# Patient Record
Sex: Female | Born: 1935 | Race: White | Hispanic: No | Smoking: Never smoker
Health system: Southern US, Community
[De-identification: ages and names within clinical notes are randomized; demographics above are authoritative.]

## PROBLEM LIST (undated history)

## (undated) DIAGNOSIS — K449 Diaphragmatic hernia without obstruction or gangrene: Secondary | ICD-10-CM

## (undated) DIAGNOSIS — I639 Cerebral infarction, unspecified: Secondary | ICD-10-CM

## (undated) DIAGNOSIS — F0280 Dementia in other diseases classified elsewhere without behavioral disturbance: Secondary | ICD-10-CM

## (undated) DIAGNOSIS — K219 Gastro-esophageal reflux disease without esophagitis: Secondary | ICD-10-CM

## (undated) DIAGNOSIS — F250 Schizoaffective disorder, bipolar type: Secondary | ICD-10-CM

## (undated) DIAGNOSIS — R1319 Other dysphagia: Secondary | ICD-10-CM

## (undated) DIAGNOSIS — R7881 Bacteremia: Secondary | ICD-10-CM

## (undated) DIAGNOSIS — E039 Hypothyroidism, unspecified: Secondary | ICD-10-CM

## (undated) DIAGNOSIS — R55 Syncope and collapse: Secondary | ICD-10-CM

## (undated) DIAGNOSIS — J13 Pneumonia due to Streptococcus pneumoniae: Secondary | ICD-10-CM

## (undated) DIAGNOSIS — G309 Alzheimer's disease, unspecified: Secondary | ICD-10-CM

## (undated) DIAGNOSIS — F259 Schizoaffective disorder, unspecified: Secondary | ICD-10-CM

## (undated) DIAGNOSIS — N390 Urinary tract infection, site not specified: Secondary | ICD-10-CM

## (undated) DIAGNOSIS — E119 Type 2 diabetes mellitus without complications: Secondary | ICD-10-CM

## (undated) DIAGNOSIS — J189 Pneumonia, unspecified organism: Secondary | ICD-10-CM

## (undated) DIAGNOSIS — R001 Bradycardia, unspecified: Secondary | ICD-10-CM

## (undated) DIAGNOSIS — K222 Esophageal obstruction: Secondary | ICD-10-CM

## (undated) DIAGNOSIS — M1990 Unspecified osteoarthritis, unspecified site: Secondary | ICD-10-CM

## (undated) HISTORY — PX: HERNIA REPAIR: SHX51

## (undated) HISTORY — PX: JEJUNOSTOMY FEEDING TUBE: SUR737

## (undated) HISTORY — PX: PACEMAKER INSERTION: SHX728

## (undated) HISTORY — DX: Syncope and collapse: R55

---

## 2014-05-18 ENCOUNTER — Emergency Department: Payer: Self-pay | Admitting: Internal Medicine

## 2014-08-07 ENCOUNTER — Emergency Department
Admission: EM | Admit: 2014-08-07 | Discharge: 2014-08-08 | Disposition: A | Discharge status: Home / Self Care | Payer: Medicare Other | Attending: Emergency Medicine | Admitting: Emergency Medicine

## 2014-08-07 ENCOUNTER — Encounter: Payer: Self-pay | Admitting: *Deleted

## 2014-08-07 DIAGNOSIS — E119 Type 2 diabetes mellitus without complications: Secondary | ICD-10-CM | POA: Diagnosis not present

## 2014-08-07 DIAGNOSIS — Z87891 Personal history of nicotine dependence: Secondary | ICD-10-CM | POA: Insufficient documentation

## 2014-08-07 DIAGNOSIS — R451 Restlessness and agitation: Secondary | ICD-10-CM

## 2014-08-07 DIAGNOSIS — L089 Local infection of the skin and subcutaneous tissue, unspecified: Secondary | ICD-10-CM | POA: Diagnosis not present

## 2014-08-07 DIAGNOSIS — Z79899 Other long term (current) drug therapy: Secondary | ICD-10-CM | POA: Diagnosis not present

## 2014-08-07 DIAGNOSIS — F259 Schizoaffective disorder, unspecified: Secondary | ICD-10-CM

## 2014-08-07 HISTORY — DX: Type 2 diabetes mellitus without complications: E11.9

## 2014-08-07 HISTORY — DX: Schizoaffective disorder, unspecified: F25.9

## 2014-08-07 HISTORY — DX: Dementia in other diseases classified elsewhere, unspecified severity, without behavioral disturbance, psychotic disturbance, mood disturbance, and anxiety: F02.80

## 2014-08-07 HISTORY — DX: Schizoaffective disorder, bipolar type: F25.0

## 2014-08-07 HISTORY — DX: Alzheimer's disease, unspecified: G30.9

## 2014-08-07 LAB — COMPREHENSIVE METABOLIC PANEL
ALBUMIN: 4.7 g/dL (ref 3.5–5.0)
ALK PHOS: 118 U/L (ref 38–126)
ALT: 29 U/L (ref 14–54)
AST: 33 U/L (ref 15–41)
Anion gap: 12 (ref 5–15)
BILIRUBIN TOTAL: 0.3 mg/dL (ref 0.3–1.2)
BUN: 16 mg/dL (ref 6–20)
CO2: 25 mmol/L (ref 22–32)
Calcium: 9.7 mg/dL (ref 8.9–10.3)
Chloride: 101 mmol/L (ref 101–111)
Creatinine, Ser: 0.94 mg/dL (ref 0.44–1.00)
GFR calc Af Amer: >60 mL/min (ref >60)
GFR, EST NON AFRICAN AMERICAN: 55 mL/min — AB (ref >60)
Glucose, Bld: 131 mg/dL — ABNORMAL HIGH (ref 65–99)
POTASSIUM: 4 mmol/L (ref 3.5–5.1)
Sodium: 138 mmol/L (ref 135–145)
Total Protein: 7.5 g/dL (ref 6.5–8.1)

## 2014-08-07 LAB — CBC WITH DIFFERENTIAL/PLATELET
Basophils Absolute: 0.1 10*3/uL (ref 0–0.1)
Basophils Relative: 1 %
Eosinophils Absolute: 0.1 10*3/uL (ref 0–0.7)
Eosinophils Relative: 1 %
HCT: 41 % (ref 35.0–47.0)
Hemoglobin: 13.6 g/dL (ref 12.0–16.0)
Lymphocytes Relative: 25 %
Lymphs Abs: 1.8 10*3/uL (ref 1.0–3.6)
MCH: 29 pg (ref 26.0–34.0)
MCHC: 33.2 g/dL (ref 32.0–36.0)
MCV: 87.3 fL (ref 80.0–100.0)
Monocytes Absolute: 0.5 10*3/uL (ref 0.2–0.9)
Monocytes Relative: 7 %
Neutro Abs: 4.9 10*3/uL (ref 1.4–6.5)
Neutrophils Relative %: 66 %
Platelets: 214 10*3/uL (ref 150–440)
RBC: 4.69 MIL/uL (ref 3.80–5.20)
RDW: 15.1 % — ABNORMAL HIGH (ref 11.5–14.5)
WBC: 7.4 10*3/uL (ref 3.6–11.0)

## 2014-08-07 LAB — ETHANOL: Alcohol, Ethyl (B): <5 mg/dL (ref <5)

## 2014-08-07 LAB — VALPROIC ACID LEVEL: Valproic Acid Lvl: <10 ug/mL — ABNORMAL LOW (ref 50.0–100.0)

## 2014-08-07 MED ORDER — QUETIAPINE FUMARATE 25 MG PO TABS
25.0000 mg | ORAL_TABLET | Freq: Every day | ORAL | Status: DC
  Administered 2014-08-07: 25 mg via ORAL

## 2014-08-07 MED ORDER — DIVALPROEX SODIUM 500 MG PO DR TAB
DELAYED_RELEASE_TABLET | ORAL | Status: AC
  Filled 2014-08-07: qty 1

## 2014-08-07 MED ORDER — TRAZODONE HCL 50 MG PO TABS
50.0000 mg | ORAL_TABLET | Freq: Every day | ORAL | Status: DC
  Administered 2014-08-07: 50 mg via ORAL

## 2014-08-07 MED ORDER — TRAZODONE HCL 50 MG PO TABS
ORAL_TABLET | ORAL | Status: AC
  Administered 2014-08-07: 50 mg via ORAL
  Filled 2014-08-07: qty 1

## 2014-08-07 MED ORDER — DIVALPROEX SODIUM 250 MG PO DR TAB
250.0000 mg | DELAYED_RELEASE_TABLET | Freq: Two times a day (BID) | ORAL | Status: DC
  Administered 2014-08-08: 250 mg via ORAL

## 2014-08-07 MED ORDER — DIVALPROEX SODIUM 500 MG PO DR TAB
500.0000 mg | DELAYED_RELEASE_TABLET | Freq: Once | ORAL | Status: AC
  Administered 2014-08-07: 500 mg via ORAL

## 2014-08-07 MED ORDER — QUETIAPINE FUMARATE 25 MG PO TABS
ORAL_TABLET | ORAL | Status: AC
  Administered 2014-08-07: 25 mg via ORAL
  Filled 2014-08-07: qty 1
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
## 2014-08-07 NOTE — ED Notes (Signed)
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
## 2014-08-07 NOTE — ED Notes (Signed)

## 2014-08-07 NOTE — ED Notes (Signed)
BEHAVIORAL HEALTH ROUNDING Patient sleeping: No. Patient alert and oriented: yes Behavior appropriate: Yes.  ;  Nutrition and fluids offered: Yes  Toileting and hygiene offered: Yes  Sitter present: yes Law enforcement present: Yes  

## 2014-08-07 NOTE — ED Notes (Signed)
BEHAVIORAL HEALTH ROUNDING Patient sleeping: Yes.   Patient alert and oriented: not applicable Behavior appropriate: Yes.   Nutrition and fluids offered: Yes  Toileting and hygiene offered: Yes  Sitter present: yes Law enforcement present: Yes   Pt given applejuice

## 2014-08-07 NOTE — ED Notes (Signed)
MD at bedside. 

## 2014-08-08 DIAGNOSIS — F259 Schizoaffective disorder, unspecified: Secondary | ICD-10-CM | POA: Diagnosis not present

## 2014-08-08 LAB — URINALYSIS COMPLETE WITH MICROSCOPIC (ARMC ONLY)
Bilirubin Urine: NEGATIVE
GLUCOSE, UA: NEGATIVE mg/dL
HGB URINE DIPSTICK: NEGATIVE
Ketones, ur: NEGATIVE mg/dL
Leukocytes, UA: NEGATIVE
NITRITE: NEGATIVE
PROTEIN: NEGATIVE mg/dL
RBC / HPF: NONE SEEN RBC/hpf (ref 0–5)
Specific Gravity, Urine: 1.006 (ref 1.005–1.030)
pH: 7 (ref 5.0–8.0)

## 2014-08-08 MED ORDER — ACETAMINOPHEN 325 MG PO TABS
650.0000 mg | ORAL_TABLET | Freq: Once | ORAL | Status: AC
  Administered 2014-08-08: 650 mg via ORAL

## 2014-08-08 MED ORDER — ACETAMINOPHEN 325 MG PO TABS
ORAL_TABLET | ORAL | Status: AC
  Administered 2014-08-08: 650 mg via ORAL
  Filled 2014-08-08: qty 2

## 2014-08-08 MED ORDER — DIVALPROEX SODIUM 250 MG PO DR TAB
DELAYED_RELEASE_TABLET | ORAL | Status: AC
  Administered 2014-08-08: 250 mg via ORAL
  Filled 2014-08-08: qty 1
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
## 2014-08-08 NOTE — ED Notes (Signed)
Pt given breakfast tray

## 2014-08-08 NOTE — ED Notes (Signed)
Dr. Weber Cooks at bedside

## 2014-08-08 NOTE — ED Notes (Signed)
BEHAVIORAL HEALTH ROUNDING Patient sleeping: Yes.   Patient alert and oriented: not applicable, pt asleep Behavior appropriate: Yes.  } Nutrition and fluids offered: No, pt sleeping Toileting and hygiene offered: No, pt sleeping Sitter present: yes Law enforcement present: Yes

## 2014-08-08 NOTE — Discharge Instructions (Signed)
Please follow-up with hospice as planned please take her medicines as directed and return for any further problems thank you

## 2014-08-08 NOTE — ED Notes (Signed)

## 2014-08-08 NOTE — ED Notes (Signed)
Pt given her dentures, pt states "I want a lawyer to take away POA from my daughter"
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
## 2014-08-08 NOTE — ED Notes (Signed)
Patient continues to rest in room with no behaviors noted. Respirations even and non-labored. No anticipated needs identified. Will continue to monitor.

## 2014-08-08 NOTE — ED Notes (Signed)

## 2014-08-08 NOTE — ED Notes (Signed)
BEHAVIORAL HEALTH ROUNDING Patient sleeping: No. Patient alert and oriented: yes Behavior appropriate: Yes.  ;  Nutrition and fluids offered: Yes  Toileting and hygiene offered: Yes  Sitter present: yes Law enforcement present: Yes  

## 2014-08-08 NOTE — ED Notes (Signed)
Pt given food tray.

## 2014-08-08 NOTE — ED Notes (Signed)
Patient sleeping. NAD noted. Will continue to monitor

## 2014-08-08 NOTE — ED Notes (Signed)
pts medication counted by pharmacy and sent to pharmacy

## 2014-08-08 NOTE — ED Notes (Addendum)

## 2014-08-08 NOTE — ED Provider Notes (Signed)
-----------------------------------------   6:05 AM on 08/08/2014 -----------------------------------------   BP 166/97 mmHg  Pulse 86  Temp(Src) 99.3 F (37.4 C) (Oral)  Resp 20  Ht '5\' 4"'$  (1.626 m)  Wt 130 lb (58.968 kg)  BMI 22.30 kg/m2  SpO2 94%  The patient had no acute events since last update.  Calm and cooperative at this time.  Disposition is pending per Psychiatry/Behavioral Medicine team recommendations.     Paulette Blanch, MD 08/08/14 [phone]

## 2014-08-08 NOTE — ED Notes (Signed)
BEHAVIORAL HEALTH ROUNDING Patient sleeping: No. Patient alert and oriented: yes Behavior appropriate: Yes.  ;} Nutrition and fluids offered: Yes  Toileting and hygiene offered: Yes  Sitter present: yes Law enforcement present: Yes    Pt resting in bed in no distress, calm  ENVIRONMENTAL ASSESSMENT Potentially harmful objects out of patient reach: Yes.   Personal belongings secured: Yes.   Patient dressed in hospital provided attire only: Yes.   Plastic bags out of patient reach: Yes.   Patient care equipment (cords, cables, call bells, lines, and drains) shortened, removed, or accounted for: Yes.   Equipment and supplies removed from bottom of stretcher: Yes.   Potentially toxic materials out of patient reach: Yes.   Sharps container removed or out of patient reach: Yes.

## 2014-08-08 NOTE — ED Notes (Signed)
Patient observed sleeping at this time. No inappropriate behaviors exhibited since this RN took over care of patient in the 2300 hour. Patient sleeping with even and non-labored respirations. There are no anticipated needs at this time. Will continue to monitor.

## 2014-08-11 ENCOUNTER — Emergency Department
Admission: EM | Admit: 2014-08-11 | Discharge: 2014-08-16 | Disposition: A | Discharge status: Other Acute Inpatient Hospital | Payer: Medicare Other | Attending: Emergency Medicine | Admitting: Emergency Medicine

## 2014-08-11 DIAGNOSIS — Z87891 Personal history of nicotine dependence: Secondary | ICD-10-CM | POA: Diagnosis not present

## 2014-08-11 DIAGNOSIS — F911 Conduct disorder, childhood-onset type: Secondary | ICD-10-CM | POA: Diagnosis not present

## 2014-08-11 DIAGNOSIS — Z658 Other specified problems related to psychosocial circumstances: Secondary | ICD-10-CM

## 2014-08-11 DIAGNOSIS — E119 Type 2 diabetes mellitus without complications: Secondary | ICD-10-CM | POA: Diagnosis not present

## 2014-08-11 DIAGNOSIS — R4689 Other symptoms and signs involving appearance and behavior: Secondary | ICD-10-CM

## 2014-08-11 DIAGNOSIS — Z79899 Other long term (current) drug therapy: Secondary | ICD-10-CM | POA: Insufficient documentation

## 2014-08-11 DIAGNOSIS — Z046 Encounter for general psychiatric examination, requested by authority: Secondary | ICD-10-CM | POA: Diagnosis present

## 2014-08-11 DIAGNOSIS — R451 Restlessness and agitation: Secondary | ICD-10-CM

## 2014-08-11 DIAGNOSIS — Z638 Other specified problems related to primary support group: Secondary | ICD-10-CM

## 2014-08-11 DIAGNOSIS — F205 Residual schizophrenia: Secondary | ICD-10-CM

## 2014-08-11 LAB — URINALYSIS COMPLETE WITH MICROSCOPIC (ARMC ONLY)
Bilirubin Urine: NEGATIVE
Glucose, UA: NEGATIVE mg/dL
Hgb urine dipstick: NEGATIVE
Ketones, ur: NEGATIVE mg/dL
Nitrite: NEGATIVE
PH: 6 (ref 5.0–8.0)
PROTEIN: NEGATIVE mg/dL
SPECIFIC GRAVITY, URINE: 1.01 (ref 1.005–1.030)

## 2014-08-11 LAB — COMPREHENSIVE METABOLIC PANEL
ALT: 23 U/L (ref 14–54)
AST: 33 U/L (ref 15–41)
Albumin: 4.1 g/dL (ref 3.5–5.0)
Alkaline Phosphatase: 93 U/L (ref 38–126)
Anion gap: 10 (ref 5–15)
BUN: 21 mg/dL — ABNORMAL HIGH (ref 6–20)
CHLORIDE: 100 mmol/L — AB (ref 101–111)
CO2: 25 mmol/L (ref 22–32)
Calcium: 9.5 mg/dL (ref 8.9–10.3)
Creatinine, Ser: 0.98 mg/dL (ref 0.44–1.00)
GFR calc Af Amer: >60 mL/min (ref >60)
GFR calc non Af Amer: 53 mL/min — ABNORMAL LOW (ref >60)
Glucose, Bld: 132 mg/dL — ABNORMAL HIGH (ref 65–99)
POTASSIUM: 4.4 mmol/L (ref 3.5–5.1)
Sodium: 135 mmol/L (ref 135–145)
TOTAL PROTEIN: 8.1 g/dL (ref 6.5–8.1)
Total Bilirubin: 0.3 mg/dL (ref 0.3–1.2)

## 2014-08-11 LAB — CBC
HCT: 42.9 % (ref 35.0–47.0)
Hemoglobin: 14.2 g/dL (ref 12.0–16.0)
MCH: 28.8 pg (ref 26.0–34.0)
MCHC: 33.1 g/dL (ref 32.0–36.0)
MCV: 87.1 fL (ref 80.0–100.0)
Platelets: 208 10*3/uL (ref 150–440)
RBC: 4.92 MIL/uL (ref 3.80–5.20)
RDW: 15.5 % — AB (ref 11.5–14.5)
WBC: 7.1 10*3/uL (ref 3.6–11.0)

## 2014-08-11 LAB — URINE DRUG SCREEN, QUALITATIVE (ARMC ONLY)
Amphetamines, Ur Screen: NOT DETECTED
BARBITURATES, UR SCREEN: NOT DETECTED
Benzodiazepine, Ur Scrn: NOT DETECTED
COCAINE METABOLITE, UR ~~LOC~~: NOT DETECTED
Cannabinoid 50 Ng, Ur ~~LOC~~: NOT DETECTED
MDMA (Ecstasy)Ur Screen: NOT DETECTED
METHADONE SCREEN, URINE: NOT DETECTED
OPIATE, UR SCREEN: NOT DETECTED
PHENCYCLIDINE (PCP) UR S: NOT DETECTED
TRICYCLIC, UR SCREEN: POSITIVE — AB

## 2014-08-11 LAB — ACETAMINOPHEN LEVEL: Acetaminophen (Tylenol), Serum: <10 ug/mL — ABNORMAL LOW (ref 10–30)

## 2014-08-11 LAB — SALICYLATE LEVEL: Salicylate Lvl: <4 mg/dL (ref 2.8–30.0)

## 2014-08-11 LAB — ETHANOL: Alcohol, Ethyl (B): <5 mg/dL (ref <5)

## 2014-08-11 NOTE — ED Notes (Signed)
BEHAVIORAL HEALTH ROUNDING Patient sleeping: No. Patient alert and oriented: no Behavior appropriate: Yes.  ; If no, describe:  Nutrition and fluids offered: Yes  Toileting and hygiene offered: Yes  Sitter present: no Law enforcement present: Yes
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
## 2014-08-11 NOTE — ED Notes (Signed)
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
## 2014-08-11 NOTE — ED Notes (Signed)
BEHAVIORAL HEALTH ROUNDING Patient sleeping: No. Patient alert and oriented: no Behavior appropriate: Yes.  ; If no, describe:  Nutrition and fluids offered: Yes  Toileting and hygiene offered: Yes  Sitter present: yes Law enforcement present: Yes

## 2014-08-11 NOTE — ED Notes (Signed)
BEHAVIORAL HEALTH ROUNDING Patient sleeping: Yes.   Patient alert and oriented: no Behavior appropriate: Yes.  ; If no, describe:  Nutrition and fluids offered: No Toileting and hygiene offered: No Sitter present: yes Law enforcement present: Yes
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
## 2014-08-11 NOTE — ED Notes (Signed)
Report from shannin, rn.

## 2014-08-12 DIAGNOSIS — F911 Conduct disorder, childhood-onset type: Secondary | ICD-10-CM | POA: Diagnosis not present

## 2014-08-12 DIAGNOSIS — Z658 Other specified problems related to psychosocial circumstances: Secondary | ICD-10-CM

## 2014-08-12 DIAGNOSIS — F205 Residual schizophrenia: Secondary | ICD-10-CM

## 2014-08-12 DIAGNOSIS — Z638 Other specified problems related to primary support group: Secondary | ICD-10-CM

## 2014-08-12 MED ORDER — CLOMIPRAMINE HCL 25 MG PO CAPS
25.0000 mg | ORAL_CAPSULE | Freq: Every day | ORAL | Status: DC
  Administered 2014-08-12 – 2014-08-15 (×4): 25 mg via ORAL
  Filled 2014-08-12 (×8): qty 1

## 2014-08-12 MED ORDER — TRAZODONE HCL 50 MG PO TABS
ORAL_TABLET | ORAL | Status: AC
  Filled 2014-08-12: qty 1

## 2014-08-12 MED ORDER — BACITRACIN ZINC 500 UNIT/GM EX OINT
TOPICAL_OINTMENT | CUTANEOUS | Status: AC
  Administered 2014-08-12: 14:00:00 via CUTANEOUS
  Filled 2014-08-12: qty 0.9

## 2014-08-12 MED ORDER — DIVALPROEX SODIUM ER 500 MG PO TB24
500.0000 mg | ORAL_TABLET | Freq: Two times a day (BID) | ORAL | Status: DC
  Administered 2014-08-12 – 2014-08-15 (×5): 500 mg via ORAL
  Administered 2014-08-16: 08:00:00 via ORAL

## 2014-08-12 MED ORDER — QUETIAPINE FUMARATE 25 MG PO TABS
ORAL_TABLET | ORAL | Status: AC
  Administered 2014-08-12: 100 mg via ORAL
  Filled 2014-08-12: qty 4

## 2014-08-12 MED ORDER — DIVALPROEX SODIUM ER 500 MG PO TB24: 500.0000 mg | ORAL_TABLET | Freq: Every day | ORAL | Status: DC

## 2014-08-12 MED ORDER — TRAZODONE HCL 100 MG PO TABS
100.0000 mg | ORAL_TABLET | Freq: Every day | ORAL | Status: DC
  Administered 2014-08-12 – 2014-08-15 (×4): 100 mg via ORAL

## 2014-08-12 MED ORDER — TRAZODONE HCL 100 MG PO TABS
ORAL_TABLET | ORAL | Status: AC
  Administered 2014-08-12: 100 mg via ORAL
  Filled 2014-08-12: qty 1

## 2014-08-12 MED ORDER — QUETIAPINE FUMARATE 25 MG PO TABS
50.0000 mg | ORAL_TABLET | Freq: Every day | ORAL | Status: DC
Start: 2014-08-12 — End: 2014-08-12

## 2014-08-12 MED ORDER — QUETIAPINE FUMARATE 25 MG PO TABS
100.0000 mg | ORAL_TABLET | Freq: Every day | ORAL | Status: DC
  Administered 2014-08-12: 100 mg via ORAL

## 2014-08-12 MED ORDER — DIVALPROEX SODIUM ER 500 MG PO TB24
ORAL_TABLET | ORAL | Status: AC
  Filled 2014-08-12: qty 1

## 2014-08-12 MED ORDER — DIVALPROEX SODIUM ER 500 MG PO TB24
ORAL_TABLET | ORAL | Status: AC
  Administered 2014-08-12: 500 mg via ORAL
  Filled 2014-08-12: qty 1

## 2014-08-12 MED ORDER — TRAZODONE HCL 50 MG PO TABS
50.0000 mg | ORAL_TABLET | Freq: Every day | ORAL | Status: DC
  Administered 2014-08-12: 50 mg via ORAL

## 2014-08-12 NOTE — ED Notes (Signed)

## 2014-08-12 NOTE — ED Notes (Signed)
Pt continues to sleep.

## 2014-08-12 NOTE — ED Notes (Signed)
BEHAVIORAL HEALTH ROUNDING Patient sleeping: Yes.   Patient alert and oriented: not applicable Behavior appropriate: Yes.  ; If no, describe: sleeping Nutrition and fluids offered: Yes  Toileting and hygiene offered: Yes  Sitter present: no Law enforcement present: Yes

## 2014-08-12 NOTE — ED Notes (Signed)
BEHAVIORAL HEALTH ROUNDING Patient sleeping: No. Patient alert and oriented: yes Behavior appropriate: Yes.  ; If no, describe:  Nutrition and fluids offered: Yes  Toileting and hygiene offered: Yes  Sitter present: yes Law enforcement present: Yes ODS

## 2014-08-12 NOTE — ED Notes (Signed)
Pt continues to be up, ambulatory, states 'how am i going to get out of here?" pt redirected back to bed multiple times.

## 2014-08-12 NOTE — ED Notes (Signed)
BEHAVIORAL HEALTH ROUNDING Patient sleeping: No. Patient alert and oriented: yes Behavior appropriate: No.; If no, describe: Patient coming into the hall often and repeating herself, patient asking about her daughter.  Patient is not violent or aggressive and is able to be re-directed.   Nutrition and fluids offered: Yes  Toileting and hygiene offered: Yes  Sitter present: q 15 min. checks Law enforcement present: Yes

## 2014-08-12 NOTE — ED Notes (Signed)
Pt ambulatory around department. Pt states 'i want to move to a room and i want two sleeping pills." will notify md. Pt declines offer for fluids or snack. Pt alert to self, situation and date.

## 2014-08-12 NOTE — ED Notes (Signed)
BEHAVIORAL HEALTH ROUNDING Patient sleeping: Yes.   Patient alert and oriented: not applicable Behavior appropriate: Yes.  ; If no, describe: sleeping Nutrition and fluids offered: Yes  Toileting and hygiene offered: Yes  Sitter present: not applicable Law enforcement present: Yes

## 2014-08-12 NOTE — ED Notes (Signed)
BEHAVIORAL HEALTH ROUNDING Patient sleeping: Yes.   Patient alert and oriented: yes Behavior appropriate: Yes.  ; If no, describe: sleeping Nutrition and fluids offered: Yes  Toileting and hygiene offered: Yes  Sitter present: no Law enforcement present: Yes

## 2014-08-12 NOTE — ED Notes (Signed)
Pt sleeping. 

## 2014-08-12 NOTE — ED Notes (Signed)
BEHAVIORAL HEALTH ROUNDING Patient sleeping: Yes.   Patient alert and oriented: yes Behavior appropriate: Yes.  ; If no, describe:  Nutrition and fluids offered: No Toileting and hygiene offered: No Sitter present: no Law enforcement present: Yes  

## 2014-08-12 NOTE — ED Notes (Signed)
BEHAVIORAL HEALTH ROUNDING Patient sleeping: No. Patient alert and oriented: yes Behavior appropriate: Yes.  ; If no, describe:  Nutrition and fluids offered: Yes  Toileting and hygiene offered: Yes  Sitter present: no Law enforcement present: Yes  

## 2014-08-12 NOTE — ED Notes (Signed)
Patient's Daughter called and wanted to ensure we had her contact info:  Lisa: [phone]

## 2014-08-12 NOTE — ED Notes (Signed)
IVC/Consult complete/pending placement/All paperwork on chart
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
## 2014-08-13 DIAGNOSIS — F911 Conduct disorder, childhood-onset type: Secondary | ICD-10-CM | POA: Diagnosis not present

## 2014-08-13 MED ORDER — DIVALPROEX SODIUM 500 MG PO DR TAB
DELAYED_RELEASE_TABLET | ORAL | Status: AC
  Administered 2014-08-13: 500 mg
  Filled 2014-08-13: qty 1

## 2014-08-13 MED ORDER — QUETIAPINE FUMARATE 200 MG PO TABS
ORAL_TABLET | ORAL | Status: AC
  Administered 2014-08-13: 200 mg via ORAL
  Filled 2014-08-13: qty 1

## 2014-08-13 MED ORDER — DIVALPROEX SODIUM 500 MG PO DR TAB
DELAYED_RELEASE_TABLET | ORAL | Status: AC
  Administered 2014-08-13: 500 mg via ORAL
  Filled 2014-08-13: qty 1

## 2014-08-13 MED ORDER — QUETIAPINE FUMARATE 200 MG PO TABS
200.0000 mg | ORAL_TABLET | Freq: Every day | ORAL | Status: DC
  Administered 2014-08-13 – 2014-08-15 (×3): 200 mg via ORAL

## 2014-08-13 MED ORDER — TRAZODONE HCL 100 MG PO TABS
ORAL_TABLET | ORAL | Status: AC
  Administered 2014-08-13: 100 mg via ORAL
  Filled 2014-08-13: qty 1

## 2014-08-13 NOTE — ED Notes (Signed)
BEHAVIORAL HEALTH ROUNDING Patient sleeping: No. Patient alert and oriented: yes Behavior appropriate: Yes.  ;  Nutrition and fluids offered: Yes  Toileting and hygiene offered: Yes  Sitter present: yes Law enforcement present: Yes  

## 2014-08-13 NOTE — ED Notes (Signed)
Pt given food tray.

## 2014-08-13 NOTE — ED Notes (Signed)
BEHAVIORAL HEALTH ROUNDING Patient sleeping: No. Patient alert and oriented: yes Behavior appropriate: Yes.  ; If no, describe:  Nutrition and fluids offered: Yes  Toileting and hygiene offered: Yes  Sitter present: no Law enforcement present: Yes  

## 2014-08-13 NOTE — ED Notes (Signed)
BEHAVIORAL HEALTH ROUNDING Patient sleeping: Yes.   Patient alert and oriented: yes Behavior appropriate: Yes.  ; If no, describe:  Nutrition and fluids offered: Yes  Toileting and hygiene offered: Yes  Sitter present: yes Law enforcement present: Yes ODS

## 2014-08-13 NOTE — ED Provider Notes (Signed)
-----------------------------------------   6:02 AM on 08/13/2014 -----------------------------------------   BP 96/56 mmHg  Pulse 81  Temp(Src) 98.4 F (36.9 C) (Oral)  Resp 20  Ht '5\' 4"'$  (1.626 m)  Wt 125 lb 4.8 oz (56.836 kg)  BMI 21.50 kg/m2  SpO2 93%  The patient had no acute events since last update.  Calm and cooperative at this time.  Disposition is pending per Psychiatry/Behavioral Medicine team recommendations.     Loney Hering, MD 08/13/14 [phone]

## 2014-08-13 NOTE — ED Notes (Signed)
BEHAVIORAL HEALTH ROUNDING Patient sleeping: No. Patient alert and oriented: yes Behavior appropriate: No.; Nutrition and fluids offered: Yes  Toileting and hygiene offered: Yes  Sitter present: yes Law enforcement present: Yes

## 2014-08-13 NOTE — ED Notes (Signed)
BEHAVIORAL HEALTH ROUNDING Patient sleeping: Yes.   Patient alert and oriented: yes Behavior appropriate: Yes.  ;  Nutrition and fluids offered: Yes  Toileting and hygiene offered: Yes  Sitter present: yes Law enforcement present: Yes   ENVIRONMENTAL ASSESSMENT Potentially harmful objects out of patient reach: Yes.   Personal belongings secured: Yes.   Patient dressed in hospital provided attire only: Yes.   Plastic bags out of patient reach: Yes.   Patient care equipment (cords, cables, call bells, lines, and drains) shortened, removed, or accounted for: Yes.   Equipment and supplies removed from bottom of stretcher: Yes.   Potentially toxic materials out of patient reach: Yes.   Sharps container removed or out of patient reach: Yes.

## 2014-08-13 NOTE — Progress Notes (Signed)
PT Cancellation Note  Patient Details Name: Jill Copeland MRN: [id number] DOB: Aug 19, 1931   Cancelled Treatment:    Reason Eval/Treat Not Completed: Patient declined, no reason specified (Pt refused to participate in therapy (pt reporting she did not need to get up and did not want to get up or participate in PT))   Leitha Bleak 08/13/2014, 1:18 PM Leitha Bleak, [place]

## 2014-08-13 NOTE — ED Notes (Signed)
BEHAVIORAL HEALTH ROUNDING Patient sleeping: Yes.   Patient alert and oriented: yes Behavior appropriate: Yes.  ;  Nutrition and fluids offered: Yes  Toileting and hygiene offered: Yes  Sitter present: yes Law enforcement present: Yes  

## 2014-08-13 NOTE — ED Notes (Signed)
Patient assigned to appropriate care area. Patient oriented to unit/care area: Informed that, for their safety, care areas are designed for safety and monitored by security cameras at all times; and visiting hours explained to patient. Patient verbalizes understanding, and verbal contract for safety obtained. 

## 2014-08-13 NOTE — Consult Note (Signed)
  Psychiatry: Follow-up for this 81 year old woman with schizophrenia and dementia. Patient interviewed and chart reviewed. Patient states that she is very angry and does not like being here in the hospital. She demands to be transferred to [facility]. She tells me that she is not eating anything because she sick to her stomach and thinks that she will need to receive intravenous feeding. On review of systems patient complains of feeling sick to her stomach and claims she has thrown up although I don't see any evidence of that. Denies having hallucinations. Denies suicidal or homicidal ideation. Describes paranoia. Rest of the physical review of systems unremarkable  Mental status: Reasonably well-groomed under the circumstances woman looks her stated age. Good eye contact. Psychomotor activity limited. Speech is a little bit pressured and loud. Affect is irritated. Mood is stated as being angry. Denies suicidal or homicidal ideation. She denies hallucinations. She is oriented to the basic situation but not to the details. Memory is poor. Current vital signs are stable.  Patient with dementia and schizophrenia needs referral to geriatric psychiatry facility. Continue medications and I have increased her Seroquel dose to 200 mg at night for psychosis and agitation and mood instability. No other change to treatment plan today. Case discussed with emergency room staff.

## 2014-08-14 DIAGNOSIS — F911 Conduct disorder, childhood-onset type: Secondary | ICD-10-CM | POA: Diagnosis not present

## 2014-08-14 LAB — GLUCOSE, CAPILLARY: GLUCOSE-CAPILLARY: 108 mg/dL — AB (ref 65–99)

## 2014-08-14 MED ORDER — DIVALPROEX SODIUM ER 500 MG PO TB24
ORAL_TABLET | ORAL | Status: AC
  Administered 2014-08-14: 500 mg via ORAL
  Filled 2014-08-14: qty 1

## 2014-08-14 MED ORDER — DIVALPROEX SODIUM 500 MG PO DR TAB
DELAYED_RELEASE_TABLET | ORAL | Status: AC
  Administered 2014-08-14: 500 mg
  Filled 2014-08-14: qty 1

## 2014-08-14 MED ORDER — QUETIAPINE FUMARATE 200 MG PO TABS
ORAL_TABLET | ORAL | Status: AC
  Administered 2014-08-14: 200 mg via ORAL
  Filled 2014-08-14: qty 1

## 2014-08-14 MED ORDER — TRAZODONE HCL 50 MG PO TABS
ORAL_TABLET | ORAL | Status: AC
  Administered 2014-08-14: 100 mg via ORAL
  Filled 2014-08-14: qty 2

## 2014-08-14 NOTE — ED Notes (Signed)
Pt showered

## 2014-08-14 NOTE — ED Notes (Signed)
BEHAVIORAL HEALTH ROUNDING Patient sleeping: No. Patient alert and oriented: yes Behavior appropriate: Yes.  ;  Nutrition and fluids offered: Yes  Toileting and hygiene offered: Yes  Sitter present:Q51mns checks  Law enforcement present: Yes

## 2014-08-14 NOTE — ED Notes (Signed)
Pt laying on stretcher, no change in condition.

## 2014-08-14 NOTE — ED Notes (Signed)
BEHAVIORAL HEALTH ROUNDING Patient sleeping: YES Patient alert and oriented: YES Behavior appropriate: YES Describe behavior: No inappropriate or unacceptable behaviors noted at this time.  Nutrition and fluids offered: YES Toileting and hygiene offered: YES Sitter present: Industrial/product designer rounding every 15 minutes on patient to ensure safety.  Law enforcement present: Programmer, applications agency: [place] (ODS)

## 2014-08-14 NOTE — ED Notes (Signed)
BEHAVIORAL HEALTH ROUNDING Patient sleeping: YES Patient alert and oriented: YES Behavior appropriate: YES Describe behavior: No inappropriate or unacceptable behaviors noted at this time.  Nutrition and fluids offered: YES Toileting and hygiene offered: YES Sitter present: Industrial/product designer rounding every 15 minutes on patient to ensure safety.  Law enforcement present: Programmer, applications agency: [place] (ODS)

## 2014-08-14 NOTE — ED Notes (Signed)

## 2014-08-14 NOTE — ED Notes (Signed)
BEHAVIORAL HEALTH ROUNDING Patient sleeping: No. Patient alert and oriented: yes Behavior appropriate: Yes.  ;  Nutrition and fluids offered: Yes  Toileting and hygiene offered: Yes  Sitter present:Q14mns checks  Law enforcement present: Yes

## 2014-08-14 NOTE — ED Notes (Signed)
BEHAVIORAL HEALTH ROUNDING Patient sleeping: Yes.   Patient alert and oriented: sleeping Behavior appropriate: Yes.  ; If no, describe: sleeping Nutrition and fluids offered: sleeping Toileting and hygiene offered: sleeping Sitter present: no Law enforcement present: Yes  and [place]

## 2014-08-14 NOTE — ED Notes (Signed)
BEHAVIORAL HEALTH ROUNDING Patient sleeping: Yes.   Patient alert and oriented: not applicable Behavior appropriate: Yes.  ; If no, describe:  Nutrition and fluids offered: Yes  Toileting and hygiene offered: Yes  Sitter present: yes Law enforcement present: Yes [employer]

## 2014-08-14 NOTE — ED Notes (Addendum)
Pt receiving physical therapy at this time. PT reports pt is stiff but has a steady gait. Reita Cliche, MD informed

## 2014-08-14 NOTE — ED Notes (Signed)
Pt in shower.  

## 2014-08-14 NOTE — ED Notes (Signed)
Pt ate all of her graham crackers and drank juice provided by staff. Tolerated well; pt appears calm and cooperative at this time. Will continue to assess.

## 2014-08-14 NOTE — ED Notes (Signed)
BEHAVIORAL HEALTH ROUNDING Patient sleeping: YES Patient alert and oriented: YES Behavior appropriate: YES Describe behavior: No inappropriate or unacceptable behaviors noted at this time.  Nutrition and fluids offered: YES Toileting and hygiene offered: YES Sitter present: Industrial/product designer rounding every 15 minutes on patient to ensure safety.  Law enforcement present: Programmer, applications agency: [place] (ODS)

## 2014-08-14 NOTE — ED Notes (Signed)
BEHAVIORAL HEALTH ROUNDING Patient sleeping: Yes.   Patient alert and oriented: Pt sleeping  Behavior appropriate: Pt sleeping  Nutrition and fluids offered: Yes  Toileting and hygiene offered: Yes  Sitter present: not applicable Law enforcement present: Yes

## 2014-08-14 NOTE — ED Notes (Signed)
Pt eating breakfast at this time.  

## 2014-08-14 NOTE — ED Notes (Signed)
Report received from [facility], care assumed. Pt laying in bed comfortably no distress noted.  EDT doing q15 min checks, will continue to monitor.

## 2014-08-14 NOTE — ED Notes (Signed)
PT  IVC  ALL PAPERWORK  ON  CHART  PT  SEEN  BY  DR  CLAPACS ON  08/14/14 PENDING PLACEMENT

## 2014-08-14 NOTE — ED Notes (Signed)
Pt laying in bed.  

## 2014-08-14 NOTE — ED Notes (Signed)
BEHAVIORAL HEALTH ROUNDING Patient sleeping: Yes.   Patient alert and oriented: N/A Behavior appropriate: Yes.  ; If no, describe:  Nutrition and fluids offered: Yes  Toileting and hygiene offered: Yes  Sitter present: yes Law enforcement present: Yes [employer]

## 2014-08-14 NOTE — ED Provider Notes (Signed)
-----------------------------------------   9:09 AM on 08/14/2014 -----------------------------------------  I except a care from Dr. Owens Shark overnight. I reviewed vitals this morning and they're stable. Per nursing home report there are no acute events overnight. This morning the patient is awake and alert, although demented. She does ask over and over to move to [place]. She states she does not know anyone there. She is under involuntary commitment by her daughter for aggression associated with dementia. She has been seen by psychiatry and the plan is for geriatric psychiatric placement/transfer.  Lisa Roca, MD 08/14/14 [phone]

## 2014-08-14 NOTE — Evaluation (Signed)
Physical Therapy Evaluation Patient Details Name: Jill Copeland MRN: [id number] DOB: Dec 16, 1931 Today's Date: 08/14/2014   History of Present Illness  Pt is an 81 y.o. female presenting to ED 08/07/14 with agitation, schizophrenia, and "off medications".  Clinical Impression  Pt currently in ED (per notes plan for geriatric psychiatric placement).  Pt demonstrates "stiff" but steady gait without AD; no loss of balance noted during session.  Pt supervision for ambulation only d/t flight risk precautions.  Pt scored 26/28 on Tinetti balance assessment indicating pt is at low risk for falls.  Pt does not demonstrate any acute hospital PT needs at this time.  Will complete PT order.  Will discharge pt from PT in house.     Follow Up Recommendations No PT follow up    Equipment Recommendations  None recommended by PT    Recommendations for Other Services       Precautions / Restrictions Precautions Precautions: Fall Precaution Comments: Flight Risk Restrictions Weight Bearing Restrictions: No      Mobility  Bed Mobility Overal bed mobility: Independent                Transfers Overall transfer level: Independent Equipment used: None                Ambulation/Gait Ambulation/Gait assistance: Supervision (d/t flight risk precautions) Ambulation Distance (Feet): 300 Feet Assistive device: None       General Gait Details: Pt with "stiff" gait throughout ambulation but steady without loss of balance  Stairs            Wheelchair Mobility    Modified Rankin (Stroke Patients Only)       Balance Overall balance assessment: Independent                               Standardized Balance Assessment Standardized Balance Assessment :  (Tinetti balance assessment: pt scored 26/28 (decreased B step length/height d/t "stiffness") which indicates pt is at low risk for falls.)           Pertinent Vitals/Pain Pain Assessment: 0-10 Pain  Score: 8  Pain Location: L lateral trunk Pain Intervention(s): Limited activity within patient's tolerance;Monitored during session (Nursing notified of pain)  During session HR 90-100 bpm and O2 >96% on room air.    Home Living Family/patient expects to be discharged to::  (wants to go to [place]) Living Arrangements: Children (lives with her daughter)                    Prior Function Level of Independence: Independent               Hand Dominance        Extremity/Trunk Assessment   Upper Extremity Assessment: Overall WFL for tasks assessed           Lower Extremity Assessment: Overall WFL for tasks assessed         Communication   Communication: Other (comment) (pt tangential and perseverating on getting a bath/hair washed)  Cognition Arousal/Alertness: Awake/alert Behavior During Therapy:  (Perseverating) Overall Cognitive Status: No family/caregiver present to determine baseline cognitive functioning                      General Comments  Nursing cleared pt for participation in physical therapy.  Pt agreeable to PT eval.    Exercises        Assessment/Plan  PT Assessment Patent does not need any further PT services  PT Diagnosis Abnormality of gait   PT Problem List    PT Treatment Interventions     PT Goals (Current goals can be found in the Care Plan section) Acute Rehab PT Goals Patient Stated Goal: To move around more PT Goal Formulation: With patient Time For Goal Achievement: 08/14/14 Potential to Achieve Goals: Good    Frequency     Barriers to discharge        Co-evaluation               End of Session   Activity Tolerance: Patient tolerated treatment well Patient left: in bed;with nursing/sitter in room (B rails in place) Nurse Communication: Mobility status    Functional Assessment Tool Used: AM-PAC without stairs Functional Limitation: Mobility: Walking and moving around Mobility: Walking  and Moving Around Current Status (I3704): At least 1 percent but less than 20 percent impaired, limited or restricted Mobility: Walking and Moving Around Goal Status [phone]): At least 1 percent but less than 20 percent impaired, limited or restricted Mobility: Walking and Moving Around Discharge Status [phone]): At least 1 percent but less than 20 percent impaired, limited or restricted    Time: 1000-1015 PT Time Calculation (min) (ACUTE ONLY): 15 min   Charges:   PT Evaluation $Initial PT Evaluation Tier I: 1 Procedure     PT G Codes:   PT G-Codes **NOT FOR INPATIENT CLASS** Functional Assessment Tool Used: AM-PAC without stairs Functional Limitation: Mobility: Walking and moving around Mobility: Walking and Moving Around Current Status (T8882): At least 1 percent but less than 20 percent impaired, limited or restricted Mobility: Walking and Moving Around Goal Status [phone]): At least 1 percent but less than 20 percent impaired, limited or restricted Mobility: Walking and Moving Around Discharge Status [phone]): At least 1 percent but less than 20 percent impaired, limited or restricted    [facility] 08/14/2014, 11:03 AM Leitha Bleak, [place]

## 2014-08-14 NOTE — ED Notes (Signed)
BEHAVIORAL HEALTH ROUNDING Patient sleeping: YES Patient alert and oriented: YES Behavior appropriate: YES Describe behavior: No inappropriate or unacceptable behaviors noted at this time.  Nutrition and fluids offered: YES Toileting and hygiene offered: YES Sitter present: Industrial/product designer rounding every 15 minutes on patient to ensure safety.  Law enforcement present: Programmer, applications agency: [place] (ODS)

## 2014-08-14 NOTE — ED Notes (Signed)

## 2014-08-14 NOTE — Consult Note (Signed)
  Psychiatry: Follow-up for this 81 year old woman with schizophrenia. Patient herself has no new complaints today. Says that she would like to leave here. She thinks that her daughter is willing to take her back but I doubt that given her daughter's prior attitude. Patient has been started back on psychiatric medicine and has tolerated them fine and been compliant. Her affect is euthymic. Eye contact good. Psychomotor activity stable. Speech normal rate tone and volume. Still a little bit confused and paranoid.  No new complaints.  Is awaiting transfer to a geriatric psychiatry unit as soon as we can find one. No change to medication for now.

## 2014-08-14 NOTE — ED Notes (Signed)
No change in condition will continue to monitor

## 2014-08-15 ENCOUNTER — Emergency Department: Payer: Medicare Other

## 2014-08-15 ENCOUNTER — Other Ambulatory Visit: Payer: Self-pay

## 2014-08-15 DIAGNOSIS — F911 Conduct disorder, childhood-onset type: Secondary | ICD-10-CM | POA: Diagnosis not present

## 2014-08-15 MED ORDER — DIVALPROEX SODIUM ER 500 MG PO TB24
ORAL_TABLET | ORAL | Status: AC
  Administered 2014-08-15: 500 mg via ORAL
  Filled 2014-08-15: qty 1

## 2014-08-15 MED ORDER — QUETIAPINE FUMARATE 200 MG PO TABS
ORAL_TABLET | ORAL | Status: AC
  Filled 2014-08-15: qty 1

## 2014-08-15 MED ORDER — TRAZODONE HCL 100 MG PO TABS
ORAL_TABLET | ORAL | Status: AC
  Filled 2014-08-15: qty 1

## 2014-08-15 NOTE — ED Notes (Signed)
BEHAVIORAL HEALTH ROUNDING Patient sleeping: No. Patient alert and oriented: no Behavior appropriate: Yes.  ; If no, describe:  Nutrition and fluids offered: Yes  Toileting and hygiene offered: Yes  Sitter present: no Law enforcement present: Yes

## 2014-08-15 NOTE — ED Notes (Signed)
BEHAVIORAL HEALTH ROUNDING Patient sleeping: Yes.   Patient alert and oriented: no Behavior appropriate: Yes.  ; If no, describe:  Nutrition and fluids offered: Yes  Toileting and hygiene offered: Yes  Sitter present: no Law enforcement present: Yes

## 2014-08-15 NOTE — ED Notes (Signed)

## 2014-08-15 NOTE — ED Provider Notes (Signed)
-----------------------------------------   7:45 AM on 08/15/2014 -----------------------------------------   BP 120/54 mmHg  Pulse 60  Temp(Src) 97.9 F (36.6 C) (Oral)  Resp 18  Ht '5\' 4"'$  (1.626 m)  Wt 125 lb 4.8 oz (56.836 kg)  BMI 21.50 kg/m2  SpO2 97%  The patient had no acute events since last update.  Calm and cooperative at this time.  Disposition is pending per Psychiatry/Behavioral Medicine team recommendations.     Paulette Blanch, MD 08/15/14 [phone]

## 2014-08-15 NOTE — ED Provider Notes (Signed)
Clinic back the patient noticed a red area on her left lower leg. I examined the patient there is a healing superficial wound surrounded by about 1 cm of erythema on each side. Patient's family member who is visiting the patient right now reports patient had injured the leg and this erythema is much improved over its appearance initially. There is no apparent tenderness there is no pus coming from the wound and there is no increased warmth. Will redress with wound and continue to monitor.  Nena Polio, MD 08/15/14 1226

## 2014-08-15 NOTE — ED Notes (Signed)
BEHAVIORAL HEALTH ROUNDING Patient sleeping: Yes.   Patient alert and oriented: sleeping Behavior appropriate: Yes.  ; If no, describe: sleeping Nutrition and fluids offered: sleeping Toileting and hygiene offered: sleeping Sitter present: no Law enforcement present: Yes  and [place]

## 2014-08-15 NOTE — ED Notes (Signed)
BEHAVIORAL HEALTH ROUNDING Patient sleeping: Yes.   Patient alert and oriented: not applicable Behavior appropriate: Yes.  ; If no, describe:  Nutrition and fluids offered: Yes  Toileting and hygiene offered: Yes  Sitter present: no Law enforcement present: Yes

## 2014-08-15 NOTE — BHH Counselor (Signed)
Received phone call from [facility]([phone]) requesting Chest X-Ray and EKG, for possible placement. Information forwarded to pt. nurse Grace Bushy., RN).

## 2014-08-15 NOTE — Consult Note (Signed)
  Psychiatry: This is a follow-up note for this 81 year old woman with schizophrenia and dementia. On interview today the patient continues to insist that she needs to go to a different room and then be moved to [place]. She is somewhat irritable but has not been hostile or threatening with staff here. She is eating and drinking well and able to take care of her basic at activities of daily living. She has been cooperative with medicine.  On review of systems she complains of a sore on her shin which does look reddened. I have agreed to mention it to the emergency room doctor. Sleeping adequately. Denies hallucinations. Denies suicidal ideation.  On mental status she is disheveled disorganized paranoid. Poor insight. Alert and oriented but poor short-term memory and poor long-term memory. Evidence of paranoid delusions.  I'm told that there is a good chance she will be admitted to a geriatric psychiatry ward today. No indication to change medicine at this point. Tolerating it well. Patient has been advised that she will be discharged as soon as we can find a geriatric ward and is cooperative with the plan.

## 2014-08-15 NOTE — ED Notes (Signed)
BEHAVIORAL HEALTH ROUNDING Patient sleeping: No. Patient alert and oriented: yes Behavior appropriate: Yes.  ; If no, describe:  Nutrition and fluids offered: Yes  Toileting and hygiene offered: Yes  Sitter present: no Law enforcement present: Yes  

## 2014-08-15 NOTE — ED Notes (Signed)
BEHAVIORAL HEALTH ROUNDING Patient sleeping: No. Patient alert and oriented: Yes Behavior appropriate: Yes.  ; If no, describe:  Nutrition and fluids offered: Yes  Toileting and hygiene offered: Yes  Sitter present: Yes Law enforcement present: Yes

## 2014-08-15 NOTE — BHH Counselor (Signed)
Additional Information requested faxed to [facility].

## 2014-08-16 DIAGNOSIS — F911 Conduct disorder, childhood-onset type: Secondary | ICD-10-CM | POA: Diagnosis not present

## 2014-08-16 MED ORDER — DIVALPROEX SODIUM ER 500 MG PO TB24
ORAL_TABLET | ORAL | Status: AC
  Filled 2014-08-16: qty 1

## 2014-08-16 NOTE — ED Notes (Addendum)
BEHAVIORAL HEALTH ROUNDING Patient sleeping: Yes.   Patient alert and oriented: N/A Behavior appropriate: Yes.  ; If no, describe:  Nutrition and fluids offered: Yes  Toileting and hygiene offered: Yes  Sitter present: yes Law enforcement present: Yes

## 2014-08-16 NOTE — Consult Note (Signed)
  Psychiatry: Follow-up for this patient with schizophrenia and dementia. No new complaints today. Review of systems all the same as previously. Mental status patient continues to be confused but denies suicidal ideation and has not been hostile or agitated. Remains paranoid at times.  Patient has been accepted to [facility] geriatric psychiatry unit. She will be transferred under involuntary commitment paperwork this evening. No further changes to medication at this time.

## 2014-08-16 NOTE — ED Notes (Signed)
BEHAVIORAL HEALTH ROUNDING Patient sleeping: No. Patient alert and oriented: yes oriented to self.  Behavior appropriate: Yes.  ; If no, describe:  Nutrition and fluids offered: Yes  Toileting and hygiene offered: Yes  Sitter present: yes Law enforcement present: Yes

## 2014-08-16 NOTE — ED Notes (Signed)
BEHAVIORAL HEALTH ROUNDING Patient sleeping: No. Patient alert and oriented: yes, oriented to self Behavior appropriate: Yes.  ; If no, describe:  Nutrition and fluids offered: Yes  Toileting and hygiene offered: Yes  Sitter present: yes Law enforcement present: Yes

## 2014-08-16 NOTE — ED Notes (Signed)
BEHAVIORAL HEALTH ROUNDING Patient sleeping: No. Patient alert  yes Behavior appropriate: Yes.  ; If no, describe:  Nutrition and fluids offered: Yes  Toileting and hygiene offered: Yes  Sitter present: yes Law enforcement present: Yes

## 2014-08-16 NOTE — ED Notes (Signed)
BEHAVIORAL HEALTH ROUNDING Patient sleeping: Yes.   Patient alert and oriented: Sleeping  Behavior appropriate: Yes.  ; If no, describe:  Nutrition and fluids offered: Sleeping  Toileting and hygiene offered: Sleeping  Sitter present: Yes Law enforcement present: Yes

## 2014-08-16 NOTE — ED Provider Notes (Signed)
Patient has been accepted in transfer transfer form filled out patient remained stable  Jill Polio, MD 08/16/14 [phone]

## 2014-08-16 NOTE — ED Notes (Signed)
BEHAVIORAL HEALTH ROUNDING Patient sleeping: No. Patient alert and oriented: yes Behavior appropriate: Yes.  ; If no, describe:  Nutrition and fluids offered: Yes  Toileting and hygiene offered: Yes, patient currently showering. Sitter present: yes Event organiser present: Yes

## 2014-08-16 NOTE — BHH Counselor (Signed)
Pt. has been accepted to [facility]. Accepting physician is Dr. Perry Mount. Call report to [phone]. Representative was [employer]. ER Staff Kirby Crigler, ER Sect.)) have been made aware it.  Pt.'s Family/Support System [facility] [place], [phone]) was called in order to update them but was unable to reach her. Writer left a voicemail message requesting a call back. None of the pt. Identifying information was left on the voicemail message.

## 2014-08-16 NOTE — ED Notes (Signed)
BEHAVIORAL HEALTH ROUNDING Patient sleeping: No. Patient alert and oriented: no Behavior appropriate: Yes.  ; If no, describe:  Nutrition and fluids offered: Yes  Toileting and hygiene offered: Yes  Sitter present: Yes Law enforcement present: Yes

## 2014-08-16 NOTE — ED Provider Notes (Signed)
-----------------------------------------   3:21 PM on 08/16/2014 -----------------------------------------  Patient has a tortuous aorta on chest x-ray, and may benefit from a CT scan at some point as an outpatient to further evaluate if deemed appropriate by her primary care doctor. Patient does not have any complaints of chest pain or abdominal pain.   Harvest Dark, MD 08/16/14 [phone]

## 2014-08-16 NOTE — BHH Counselor (Signed)
Received a phone call back from pt. Daughter (System Lattie Haw [phone]) and she was updated about the pt. Being admitted to [facility]. Noting else needed at this time.

## 2014-08-16 NOTE — BHH Counselor (Signed)
Faxed updated not of ER MD(Paduchowski) to [facility] about pt. Having no pain.

## 2014-08-16 NOTE — BHH Counselor (Signed)
Followed up with [facility]([phone]), pt. Is pending review with their MD. According to them "Chest X-Ray showed some abnormalities."  They will call back with disposition.

## 2014-08-16 NOTE — ED Provider Notes (Signed)
-----------------------------------------   7:59 AM on 08/16/2014 -----------------------------------------   BP 134/64 mmHg  Pulse 74  Temp(Src) 99 F (37.2 C) (Oral)  Resp 18  Ht '5\' 4"'$  (1.626 m)  Wt 125 lb 4.8 oz (56.836 kg)  BMI 21.50 kg/m2  SpO2 97%  The patient had no acute events since last update.  Calm and cooperative at this time.  Disposition is pending per Psychiatry/Behavioral Medicine team recommendations. Per Behavioral Health they are speaking with [facility] for geriatric/psychiatric placement. Currently awaiting their approval of the patient. No new events.   Harvest Dark, MD 08/16/14 0800

## 2014-08-16 NOTE — ED Notes (Signed)
BEHAVIORAL HEALTH ROUNDING  Patient sleeping: Yes.  Patient alert and oriented: Sleeping  Behavior appropriate: Yes. ; If no, describe:  Nutrition and fluids offered: Sleeping  Toileting and hygiene offered: Sleeping  Sitter present: Yes  Law enforcement present: Yes

## 2014-08-16 NOTE — ED Notes (Signed)
BEHAVIORAL HEALTH ROUNDING Patient sleeping: No   Patient alert and oriented: Yes Behavior appropriate: Yes.  ; If no, describe:  Nutrition and fluids offered: Yes  Toileting and hygiene offered: Yes Sitter present: yes Law enforcement present: Yes

## 2014-10-21 ENCOUNTER — Emergency Department
Admission: EM | Admit: 2014-10-21 | Discharge: 2014-10-23 | Disposition: A | Discharge status: Other Acute Inpatient Hospital | Payer: Medicare Other | Attending: Student | Admitting: Student

## 2014-10-21 ENCOUNTER — Encounter: Payer: Self-pay | Admitting: Emergency Medicine

## 2014-10-21 DIAGNOSIS — E119 Type 2 diabetes mellitus without complications: Secondary | ICD-10-CM | POA: Diagnosis not present

## 2014-10-21 DIAGNOSIS — F0391 Unspecified dementia with behavioral disturbance: Secondary | ICD-10-CM

## 2014-10-21 DIAGNOSIS — F0390 Unspecified dementia without behavioral disturbance: Secondary | ICD-10-CM | POA: Diagnosis not present

## 2014-10-21 DIAGNOSIS — F205 Residual schizophrenia: Secondary | ICD-10-CM

## 2014-10-21 DIAGNOSIS — Z87891 Personal history of nicotine dependence: Secondary | ICD-10-CM | POA: Diagnosis not present

## 2014-10-21 DIAGNOSIS — F912 Conduct disorder, adolescent-onset type: Secondary | ICD-10-CM | POA: Diagnosis present

## 2014-10-21 DIAGNOSIS — R4689 Other symptoms and signs involving appearance and behavior: Secondary | ICD-10-CM

## 2014-10-21 DIAGNOSIS — F0280 Dementia in other diseases classified elsewhere without behavioral disturbance: Secondary | ICD-10-CM | POA: Diagnosis present

## 2014-10-21 LAB — URINALYSIS COMPLETE WITH MICROSCOPIC (ARMC ONLY)
BACTERIA UA: NONE SEEN
BILIRUBIN URINE: NEGATIVE
Glucose, UA: >500 mg/dL — AB
HGB URINE DIPSTICK: NEGATIVE
KETONES UR: NEGATIVE mg/dL
Nitrite: NEGATIVE
PH: 5 (ref 5.0–8.0)
Protein, ur: 30 mg/dL — AB
RBC / HPF: NONE SEEN RBC/hpf (ref 0–5)
Specific Gravity, Urine: 1.015 (ref 1.005–1.030)

## 2014-10-21 LAB — URINE DRUG SCREEN, QUALITATIVE (ARMC ONLY)
Amphetamines, Ur Screen: NOT DETECTED
BARBITURATES, UR SCREEN: NOT DETECTED
BENZODIAZEPINE, UR SCRN: NOT DETECTED
COCAINE METABOLITE, UR ~~LOC~~: NOT DETECTED
Cannabinoid 50 Ng, Ur ~~LOC~~: NOT DETECTED
MDMA (ECSTASY) UR SCREEN: NOT DETECTED
METHADONE SCREEN, URINE: NOT DETECTED
Opiate, Ur Screen: NOT DETECTED
Phencyclidine (PCP) Ur S: NOT DETECTED
TRICYCLIC, UR SCREEN: NOT DETECTED

## 2014-10-21 LAB — COMPREHENSIVE METABOLIC PANEL
ALK PHOS: 100 U/L (ref 38–126)
ALT: 20 U/L (ref 14–54)
AST: 32 U/L (ref 15–41)
Albumin: 4.3 g/dL (ref 3.5–5.0)
Anion gap: 14 (ref 5–15)
BILIRUBIN TOTAL: 0.4 mg/dL (ref 0.3–1.2)
BUN: 9 mg/dL (ref 6–20)
CHLORIDE: 94 mmol/L — AB (ref 101–111)
CO2: 20 mmol/L — AB (ref 22–32)
Calcium: 8.9 mg/dL (ref 8.9–10.3)
Creatinine, Ser: 1.06 mg/dL — ABNORMAL HIGH (ref 0.44–1.00)
GFR calc Af Amer: 56 mL/min — ABNORMAL LOW (ref >60)
GFR calc non Af Amer: 48 mL/min — ABNORMAL LOW (ref >60)
GLUCOSE: 155 mg/dL — AB (ref 65–99)
Potassium: 3.7 mmol/L (ref 3.5–5.1)
Sodium: 128 mmol/L — ABNORMAL LOW (ref 135–145)
Total Protein: 7.9 g/dL (ref 6.5–8.1)

## 2014-10-21 LAB — CBC WITH DIFFERENTIAL/PLATELET
BASOS ABS: 0 10*3/uL (ref 0–0.1)
Basophils Relative: 1 %
Eosinophils Absolute: 0.1 10*3/uL (ref 0–0.7)
Eosinophils Relative: 1 %
HCT: 41.5 % (ref 35.0–47.0)
Hemoglobin: 13.7 g/dL (ref 12.0–16.0)
LYMPHS ABS: 2.7 10*3/uL (ref 1.0–3.6)
Lymphocytes Relative: 36 %
MCH: 27.7 pg (ref 26.0–34.0)
MCHC: 33.1 g/dL (ref 32.0–36.0)
MCV: 83.7 fL (ref 80.0–100.0)
MONOS PCT: 10 %
Monocytes Absolute: 0.8 10*3/uL (ref 0.2–0.9)
Neutro Abs: 4 10*3/uL (ref 1.4–6.5)
Neutrophils Relative %: 52 %
Platelets: 225 10*3/uL (ref 150–440)
RBC: 4.96 MIL/uL (ref 3.80–5.20)
RDW: 14.3 % (ref 11.5–14.5)
WBC: 7.6 10*3/uL (ref 3.6–11.0)

## 2014-10-21 LAB — ACETAMINOPHEN LEVEL

## 2014-10-21 LAB — ETHANOL

## 2014-10-21 LAB — SALICYLATE LEVEL

## 2014-10-21 MED ORDER — LORAZEPAM 0.5 MG PO TABS
0.5000 mg | ORAL_TABLET | Freq: Once | ORAL | Status: AC
  Administered 2014-10-21: 0.5 mg via ORAL
  Filled 2014-10-21: qty 1

## 2014-10-21 MED ORDER — TRAZODONE HCL 100 MG PO TABS
100.0000 mg | ORAL_TABLET | Freq: Every day | ORAL | Status: DC
  Administered 2014-10-21 – 2014-10-22 (×2): 100 mg via ORAL
  Filled 2014-10-21: qty 1

## 2014-10-21 MED ORDER — ZIPRASIDONE MESYLATE 20 MG IM SOLR: 20.0000 mg | Freq: Once | INTRAMUSCULAR | Status: AC

## 2014-10-21 MED ORDER — LORAZEPAM 2 MG/ML IJ SOLN
INTRAMUSCULAR | Status: AC
Start: 2014-10-21 — End: 2014-10-22
  Filled 2014-10-21: qty 1

## 2014-10-21 MED ORDER — LORAZEPAM 2 MG/ML IJ SOLN
1.0000 mg | Freq: Once | INTRAMUSCULAR | Status: AC
  Administered 2014-10-21: 1 mg via INTRAVENOUS

## 2014-10-21 MED ORDER — LORAZEPAM 2 MG/ML IJ SOLN: 1.0000 mg | INTRAMUSCULAR | Status: DC | PRN

## 2014-10-21 MED ORDER — QUETIAPINE FUMARATE 25 MG PO TABS
100.0000 mg | ORAL_TABLET | ORAL | Status: AC
Start: 2014-10-21 — End: 2014-10-21
  Administered 2014-10-21: 100 mg via ORAL
  Filled 2014-10-21: qty 4
  Filled 2014-10-21: qty 2

## 2014-10-21 MED ORDER — CEPHALEXIN 500 MG PO CAPS
500.0000 mg | ORAL_CAPSULE | Freq: Three times a day (TID) | ORAL | Status: DC
  Administered 2014-10-21 – 2014-10-22 (×4): 500 mg via ORAL
  Filled 2014-10-21 (×3): qty 1

## 2014-10-21 MED ORDER — ZIPRASIDONE MESYLATE 20 MG IM SOLR
20.0000 mg | Freq: Once | INTRAMUSCULAR | Status: AC
  Administered 2014-10-21: 20 mg via INTRAMUSCULAR
  Filled 2014-10-21: qty 20

## 2014-10-21 MED ORDER — ZIPRASIDONE MESYLATE 20 MG IM SOLR: 20.0000 mg | Freq: Two times a day (BID) | INTRAMUSCULAR | Status: DC | PRN

## 2014-10-21 MED ORDER — FAMOTIDINE 20 MG PO TABS
20.0000 mg | ORAL_TABLET | Freq: Two times a day (BID) | ORAL | Status: DC
  Administered 2014-10-21 – 2014-10-23 (×4): 20 mg via ORAL
  Filled 2014-10-21 (×5): qty 1

## 2014-10-21 MED ORDER — QUETIAPINE FUMARATE 25 MG PO TABS
50.0000 mg | ORAL_TABLET | Freq: Three times a day (TID) | ORAL | Status: DC
  Administered 2014-10-21 – 2014-10-23 (×6): 50 mg via ORAL
  Filled 2014-10-21 (×4): qty 2

## 2014-10-21 MED ORDER — LORAZEPAM 1 MG PO TABS
1.0000 mg | ORAL_TABLET | ORAL | Status: DC | PRN
  Administered 2014-10-22 – 2014-10-23 (×2): 1 mg via ORAL
  Filled 2014-10-21 (×3): qty 1

## 2014-10-21 NOTE — ED Notes (Signed)
Patient assigned to appropriate care area. Patient oriented to unit/care area: Informed that, for their safety, care areas are designed for safety and monitored by security cameras at all times; and visiting hours explained to patient. Patient does not understand the rules, keeps on trying to leave, police, rover and security at doorway
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
## 2014-10-21 NOTE — ED Notes (Signed)
At 1700 recived order for some geodan, pt still refusing to be cooperative

## 2014-10-21 NOTE — ED Notes (Signed)
BEHAVIORAL HEALTH ROUNDING Patient sleeping: Yes.   Patient alert and oriented: yes Behavior appropriate: Yes.  ; If no, describe:  Nutrition and fluids offered: Yes  Toileting and hygiene offered: Yes  Sitter present: yes Law enforcement present: Yes [employer] Animal nutritionist

## 2014-10-21 NOTE — ED Notes (Signed)
Psych md with pt
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
## 2014-10-21 NOTE — ED Notes (Signed)
BEHAVIORAL HEALTH ROUNDING Patient sleeping: No. Patient alert and oriented: yes Behavior appropriate: Yes.  ; If no, describe:  Nutrition and fluids offered: Yes  Toileting and hygiene offered: Yes  Sitter present: yes Law enforcement present: Yes [employer] Animal nutritionist

## 2014-10-21 NOTE — Progress Notes (Signed)
LCSW met with patient and was unable to complete assessment. Patient was very agitated and her daughter had left for the day. Will attempt to do assessment tommorow

## 2014-10-21 NOTE — ED Notes (Signed)
Still unable to get labs

## 2014-10-21 NOTE — ED Notes (Signed)
BEHAVIORAL HEALTH ROUNDING Patient sleeping: No. Patient alert and oriented: no Behavior appropriate: No.; If no, describe: will not cooperate Nutrition and fluids offered: Yes  Toileting and hygiene offered: Yes  Sitter present: no Law enforcement present: Yes

## 2014-10-21 NOTE — ED Notes (Signed)
Pt wanting to come out of room, will go back to bed for a second then gets back up, tries to hang on to staff hands in order to get out of room, officer and rover at bedside

## 2014-10-21 NOTE — ED Notes (Signed)
ED BHU [place] Is the patient under IVC or is there intent for IVC: Yes.   Is the patient medically cleared: No. Is there vacancy in the ED BHU: Yes.   Is the population mix appropriate for patient: No. Is the patient awaiting placement in inpatient or outpatient setting: No. Has the patient had a psychiatric consult: Yes.   Survey of unit performed for contraband, proper placement and condition of furniture, tampering with fixtures in bathroom, shower, and each patient room: Yes.  ; Findings:  APPEARANCE/BEHAVIOR uncooperative NEURO ASSESSMENT Orientation: time, place and person Hallucinations: No.None noted (Hallucinations) Speech: Pressured Gait: normal RESPIRATORY ASSESSMENT Normal expansion.  Clear to auscultation.  No rales, rhonchi, or wheezing. CARDIOVASCULAR ASSESSMENT regular rate and rhythm, S1, S2 normal, no murmur, click, rub or gallop GASTROINTESTINAL ASSESSMENT soft, nontender, BS WNL, no r/g EXTREMITIES normal strength, tone, and muscle mass PLAN OF CARE Provide calm/safe environment. Vital signs assessed twice daily. ED BHU Assessment once each 12-hour shift. Collaborate with intake RN daily or as condition indicates. Assure the ED provider has rounded once each shift. Provide and encourage hygiene. Provide redirection as needed. Assess for escalating behavior; address immediately and inform ED provider.  Assess family dynamic and appropriateness for visitation as needed: Yes.  ; If necessary, describe findings:  Educate the patient/family about BHU procedures/visitation: Yes.  ; If necessary, describe findings:

## 2014-10-21 NOTE — ED Notes (Signed)

## 2014-10-21 NOTE — BH Specialist Note (Signed)
This is an 81 y.o. Cauc. Female, who presents to the ED via her daughter for c/o having advanced staged alzhieimer's disease; with becoming more aggressive; increased wandering; having an addiction to laxatives; and had been in three prior facilities; this is per daughter; unable to assess due to client's mental state.

## 2014-10-22 DIAGNOSIS — F205 Residual schizophrenia: Secondary | ICD-10-CM | POA: Diagnosis not present

## 2014-10-22 DIAGNOSIS — F0390 Unspecified dementia without behavioral disturbance: Secondary | ICD-10-CM | POA: Diagnosis not present

## 2014-10-22 MED ORDER — TRAZODONE HCL 100 MG PO TABS
ORAL_TABLET | ORAL | Status: AC
  Administered 2014-10-22: 100 mg via ORAL
  Filled 2014-10-22: qty 1

## 2014-10-22 MED ORDER — QUETIAPINE FUMARATE 25 MG PO TABS
ORAL_TABLET | ORAL | Status: AC
  Administered 2014-10-22: 50 mg via ORAL
  Filled 2014-10-22: qty 2

## 2014-10-22 MED ORDER — CEPHALEXIN 500 MG PO CAPS
ORAL_CAPSULE | ORAL | Status: AC
  Administered 2014-10-22: 500 mg via ORAL
  Filled 2014-10-22: qty 1

## 2014-10-22 NOTE — ED Notes (Signed)
BEHAVIORAL HEALTH ROUNDING Patient sleeping: Yes.   Patient alert and oriented: yes Behavior appropriate: Yes.  ; If no, describe:  Nutrition and fluids offered: Yes  Toileting and hygiene offered: Yes  Sitter present: yes Law enforcement present: Yes [employer] security
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
## 2014-10-22 NOTE — ED Notes (Signed)
BEHAVIORAL HEALTH ROUNDING Patient sleeping: No. Patient alert and oriented: confused Behavior appropriate: Yes.  ; If no, describe: appropriate at this time Nutrition and fluids offered: Yes  Toileting and hygiene offered: Yes  Sitter present: q 15 min checks Law enforcement present: Yes

## 2014-10-22 NOTE — ED Notes (Signed)
BEHAVIORAL HEALTH ROUNDING Patient sleeping: Yes.   Patient alert and oriented: yes Behavior appropriate: Yes.  ; If no, describe:  Nutrition and fluids offered: Yes  Toileting and hygiene offered: Yes  Sitter present: no Event organiser present: Yes [employer] Animal nutritionist
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
## 2014-10-22 NOTE — ED Notes (Signed)
Pt eating crackers and drink.  Pt is calm and cooperative.

## 2014-10-22 NOTE — ED Notes (Signed)
meds given

## 2014-10-22 NOTE — ED Notes (Signed)
BEHAVIORAL HEALTH ROUNDING Patient sleeping: No. Patient alert and oriented: yes Behavior appropriate: Yes.  ; If no, describe:  Nutrition and fluids offered: Yes  Toileting and hygiene offered: Yes  Sitter present: yes Law enforcement present: Yes  

## 2014-10-22 NOTE — ED Notes (Signed)
Psych md with pt

## 2014-10-22 NOTE — ED Notes (Signed)
ED BHU [place] Is the patient under IVC or is there intent for IVC: Yes.   Is the patient medically cleared: No. Is there vacancy in the ED BHU: Yes.   Is the population mix appropriate for patient: No. Is the patient awaiting placement in inpatient or outpatient setting: Yes.   Has the patient had a psychiatric consult: Yes.   Survey of unit performed for contraband, proper placement and condition of furniture, tampering with fixtures in bathroom, shower, and each patient room: Yes.  ; Findings:  APPEARANCE/BEHAVIOR uncooperative NEURO ASSESSMENT Orientation: time, place and person Hallucinations: No.None noted (Hallucinations) Speech: Normal Gait: shuffle RESPIRATORY ASSESSMENT Normal expansion.  Clear to auscultation.  No rales, rhonchi, or wheezing. CARDIOVASCULAR ASSESSMENT regular rate and rhythm, S1, S2 normal, no murmur, click, rub or gallop GASTROINTESTINAL ASSESSMENT soft, nontender, BS WNL, no r/g EXTREMITIES normal strength, tone, and muscle mass PLAN OF CARE Provide calm/safe environment. Vital signs assessed twice daily. ED BHU Assessment once each 12-hour shift. Collaborate with intake RN daily or as condition indicates. Assure the ED provider has rounded once each shift. Provide and encourage hygiene. Provide redirection as needed. Assess for escalating behavior; address immediately and inform ED provider.  Assess family dynamic and appropriateness for visitation as needed: Yes.  ; If necessary, describe findings:  Educate the patient/family about BHU procedures/visitation: Yes.  ; If necessary, describe findings:

## 2014-10-22 NOTE — ED Notes (Signed)
BEHAVIORAL HEALTH ROUNDING Patient sleeping: No. Patient alert and oriented: no Behavior appropriate: No.; If no, describe: not staying in bed Nutrition and fluids offered: Yes  Toileting and hygiene offered: Yes  Sitter present: no Law enforcement present: Yes

## 2014-10-22 NOTE — ED Notes (Signed)
BEHAVIORAL HEALTH ROUNDING Patient sleeping: Yes.   Patient alert and oriented: sleeping Behavior appropriate: Yes.  ; If no, describe: sleeping Nutrition and fluids offered: Yes  Toileting and hygiene offered: Yes  Sitter present: q 15 min checks Law enforcement present: Yes  

## 2014-10-22 NOTE — ED Notes (Signed)
Pt eating dinner tray °

## 2014-10-22 NOTE — ED Notes (Signed)

## 2014-10-22 NOTE — ED Notes (Signed)
Resumed care from [employer].  Pt alert.  Pt calm and cooperative.  Pt watching tv.  Skin warm and dry.  Pt has bandages on both forearms from pt picking at her arms.

## 2014-10-22 NOTE — ED Notes (Signed)
Pt sleeping. 

## 2014-10-22 NOTE — ED Notes (Signed)
Intake with pt

## 2014-10-22 NOTE — ED Notes (Signed)
,  pt eating dinner tray.  meds given.

## 2014-10-22 NOTE — ED Notes (Signed)
Patient assigned to appropriate care area. Patient oriented to unit/care area: Informed that, for their safety, care areas are designed for safety and monitored by security cameras at all times; and visiting hours explained to patient. Patient verbalizes understanding, and verbal contract for safety obtained. 

## 2014-10-22 NOTE — ED Provider Notes (Addendum)
-----------------------------------------   8:06 AM on 10/22/2014 -----------------------------------------   Blood pressure 104/70, pulse 60, temperature 98.2 F (36.8 C), temperature source Oral, resp. rate 21, height '5\' 6"'$  (1.676 m), weight 135 lb (61.236 kg), SpO2 97 %.  The patient had no acute events since last update.  Calm and cooperative at this time.  Disposition is pending per Psychiatry/Behavioral Medicine team recommendations.     Nena Polio, MD 10/22/14 [phone]  Patient is due for a routine pacemaker check. Discussed with Dr. Mabeline Caras cardiology. He says they can reschedule that at the patient's convenience he understands patient may be her for some days and may be transferred elsewhere but still can reschedule check later.  Nena Polio, MD 10/22/14 [phone]

## 2014-10-22 NOTE — ED Notes (Signed)
BEHAVIORAL HEALTH ROUNDING Patient sleeping: Yes.   Patient alert and oriented: yes Behavior appropriate: Yes.  ; If no, describe:  Nutrition and fluids offered: Yes  Toileting and hygiene offered: Yes  Sitter present: no Law enforcement present: Yes  

## 2014-10-22 NOTE — BHH Counselor (Signed)
Faxed Facesheet, BH Assessment, Psych Assessment, and labs to [place] [facility] [place] for [facility] [facility].  Attempted fax to [facility] and [facility].

## 2014-10-23 DIAGNOSIS — F205 Residual schizophrenia: Secondary | ICD-10-CM | POA: Diagnosis not present

## 2014-10-23 DIAGNOSIS — F0390 Unspecified dementia without behavioral disturbance: Secondary | ICD-10-CM | POA: Diagnosis not present

## 2014-10-23 LAB — URINE CULTURE

## 2014-10-23 LAB — BASIC METABOLIC PANEL
Anion gap: 7 (ref 5–15)
BUN: 22 mg/dL — ABNORMAL HIGH (ref 6–20)
CHLORIDE: 105 mmol/L (ref 101–111)
CO2: 25 mmol/L (ref 22–32)
Calcium: 8.8 mg/dL — ABNORMAL LOW (ref 8.9–10.3)
Creatinine, Ser: 0.9 mg/dL (ref 0.44–1.00)
GFR calc non Af Amer: 58 mL/min — ABNORMAL LOW (ref >60)
GLUCOSE: 139 mg/dL — AB (ref 65–99)
Potassium: 4.2 mmol/L (ref 3.5–5.1)
Sodium: 137 mmol/L (ref 135–145)

## 2014-10-23 LAB — VALPROIC ACID LEVEL: VALPROIC ACID LVL: 99 ug/mL (ref 50.0–100.0)

## 2014-10-23 MED ORDER — SERTRALINE HCL 50 MG PO TABS
ORAL_TABLET | ORAL | Status: AC
  Administered 2014-10-23: 50 mg
  Filled 2014-10-23: qty 1

## 2014-10-23 MED ORDER — LORAZEPAM 1 MG PO TABS
1.0000 mg | ORAL_TABLET | ORAL | Status: AC
  Administered 2014-10-23: 1 mg via ORAL
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
## 2014-10-23 NOTE — ED Notes (Signed)
Pt to [employer]

## 2014-10-23 NOTE — ED Notes (Signed)
BEHAVIORAL HEALTH ROUNDING Patient sleeping: Yes.   Patient alert and oriented: yes Behavior appropriate: Yes.  ; If no, describe:  Nutrition and fluids offered: Yes  Toileting and hygiene offered: Yes  Sitter present: no Law enforcement present: Yes  

## 2014-10-23 NOTE — ED Notes (Signed)
BEHAVIORAL HEALTH ROUNDING Patient sleeping: Yes.   Patient alert and oriented: sleeping Behavior appropriate: Yes.  ; If no, describe: sleeping Nutrition and fluids offered: sleeping Toileting and hygiene offered: sleeping Sitter present: no Law enforcement present: Yes  and [place]

## 2014-10-23 NOTE — ED Notes (Signed)
Dr Cinda Quest said  That cardiology said for pt to reschedule her pacemaker check, daughter informed

## 2014-10-23 NOTE — ED Notes (Signed)

## 2014-10-23 NOTE — ED Notes (Signed)
BEHAVIORAL HEALTH ROUNDING Patient sleeping: No. Patient alert and oriented: yes Behavior appropriate: Yes.  ; If no, describe:  Nutrition and fluids offered: Yes  Toileting and hygiene offered: Yes  Sitter present: yes Law enforcement present: Yes [employer]  Pt coming out of the room frequently asking when she will be moving to another room, keeping pt updated as she request

## 2014-10-23 NOTE — BHH Counselor (Signed)
Followed up with referrals that was sent [facility];  [facility](Gracie-[phone]) Pending Review   [facility]([phone])-Denied due to having Alzheimer & Dementia    [place]([phone])-Denied due to Dementia   [facility]([phone])-No [name] with no Beds;  [place]([phone])-No Beds   [place](Melody-[phone])-At capacity   Information for Geriatric Placement have been faxed to additional hospitals;    [place])   Tyson Babinski [phone]),    [facility] [phone])   [facility]([phone])

## 2014-10-23 NOTE — BHH Counselor (Addendum)
Pt. has been accepted to [facility]. Accepting physician is Dr. Magdalen Spatz. Call report to [phone]. Representative was [employer]. ER Staff Kirby Crigler, ER Sect.; Dr. Joni Fears, ER MD & Marisa Severin. Patient's Nurse) have been made aware it.  Pt.'s Family/Support System (Lisa-[phone]) have been updated as well.   [employer]. [facility] [address]  [place], [place] [address]

## 2014-10-23 NOTE — ED Notes (Signed)
BEHAVIORAL HEALTH ROUNDING Patient sleeping: Yes.   Patient alert and oriented: yes Behavior appropriate: Yes.  ; If no, describe:  Nutrition and fluids offered: Yes  Toileting and hygiene offered: Yes  Sitter present: yes Law enforcement present: Yes  

## 2014-10-23 NOTE — ED Notes (Signed)
Pt becoming increasingly agitated at this time, wants to speak with her daughter. Called her daughter, allowed pt to speak with her. Awaiting transport to facility.

## 2014-10-23 NOTE — ED Notes (Signed)
BEHAVIORAL HEALTH ROUNDING Patient sleeping: No. Patient alert and oriented: yes Behavior appropriate: Yes.  ; If no, describe:  Nutrition and fluids offered: Yes  Toileting and hygiene offered: Yes  Sitter present: no Law enforcement present: Yes  

## 2014-10-23 NOTE — ED Notes (Signed)
Back to room after shower

## 2014-10-23 NOTE — ED Notes (Signed)
BEHAVIORAL HEALTH ROUNDING Patient sleeping: No  Patient alert and oriented: not applicable Behavior appropriate: Yes.  ; If no, describe:  Nutrition and fluids offered: yes Toileting and hygiene offered: yes, pt ambulatory with a steady gait to the restroom  Sitter present: yes Law enforcement present: Yes [employer]

## 2014-10-23 NOTE — ED Provider Notes (Signed)
-----------------------------------------   7:17 AM on 10/23/2014 -----------------------------------------   Blood pressure 113/57, pulse 70, temperature 98.6 F (37 C), temperature source Oral, resp. rate 18, height '5\' 6"'$  (1.676 m), weight 135 lb (61.236 kg), SpO2 96 %.  The patient had no acute events since last update.  Calm and cooperative at this time.  Disposition is pending per Psychiatry/Behavioral Medicine team recommendations.     Nena Polio, MD 10/23/14 [phone]
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
## 2014-10-23 NOTE — ED Notes (Signed)
BEHAVIORAL HEALTH ROUNDING Patient sleeping: No. Patient alert and oriented: yes Behavior appropriate: Yes.  ; If no, describe:  Nutrition and fluids offered: Yes Toileting and hygiene offered: Yes  Sitter present: yes Law enforcement present: Yes [employer]

## 2014-10-23 NOTE — ED Provider Notes (Signed)
Patient remains medically stable with unremarkable vital signs. No concerning findings on lab workup except for very mild hyponatremia which is unlikely to be causing her symptoms. She is medically clear, and accepted for transfer to inpatient behavioral medicine with Dr. Berdine Addison, awaiting transport.  Jill Mew, MD 10/23/14 [phone]

## 2014-10-23 NOTE — ED Notes (Signed)
BEHAVIORAL HEALTH ROUNDING Patient sleeping: No. Patient alert and oriented: yes Behavior appropriate: Yes.  ; If no, describe: pt is agitated. Keeps coming in the hallway wanting to know when she is leaving and where she is going. Pt has been told several times of the same by the staff and when she talked to her daughter.  Nutrition and fluids offered: Yes - refused Toileting and hygiene offered: Yes  Sitter present: yes Law enforcement present: Yes [employer]

## 2014-10-23 NOTE — ED Notes (Signed)
Requesting to take a shower . Escorted by ed Educational psychologist .

## 2014-10-23 NOTE — BHH Counselor (Signed)
Requested was obtained and faxed to different facilities.  [facility]-([phone])-EKG & Potassium  Levels  [facility] Fabian [phone])- Depakote, Urinalysis & EKG.

## 2014-10-23 NOTE — ED Notes (Signed)
Pt sleeping. 

## 2014-10-24 NOTE — ED Notes (Signed)
Chart Addendum 0218 10/24/2014: received call from [facility] with further questions about the pt, additional information faxed to the facility as requested by the secretary.

## 2014-11-20 ENCOUNTER — Encounter (HOSPITAL_COMMUNITY): Payer: Self-pay | Admitting: Emergency Medicine

## 2014-11-20 ENCOUNTER — Emergency Department (HOSPITAL_COMMUNITY)
Admission: EM | Admit: 2014-11-20 | Discharge: 2014-11-21 | Disposition: A | Discharge status: Home / Self Care | Payer: Medicare Other | Attending: Emergency Medicine | Admitting: Emergency Medicine

## 2014-11-20 DIAGNOSIS — Z87891 Personal history of nicotine dependence: Secondary | ICD-10-CM | POA: Insufficient documentation

## 2014-11-20 DIAGNOSIS — R21 Rash and other nonspecific skin eruption: Secondary | ICD-10-CM | POA: Diagnosis not present

## 2014-11-20 DIAGNOSIS — W1839XA Other fall on same level, initial encounter: Secondary | ICD-10-CM | POA: Diagnosis not present

## 2014-11-20 DIAGNOSIS — F259 Schizoaffective disorder, unspecified: Secondary | ICD-10-CM | POA: Diagnosis not present

## 2014-11-20 DIAGNOSIS — E119 Type 2 diabetes mellitus without complications: Secondary | ICD-10-CM | POA: Insufficient documentation

## 2014-11-20 DIAGNOSIS — Z880 Allergy status to penicillin: Secondary | ICD-10-CM | POA: Diagnosis not present

## 2014-11-20 DIAGNOSIS — F0280 Dementia in other diseases classified elsewhere without behavioral disturbance: Secondary | ICD-10-CM | POA: Insufficient documentation

## 2014-11-20 DIAGNOSIS — Y998 Other external cause status: Secondary | ICD-10-CM | POA: Insufficient documentation

## 2014-11-20 DIAGNOSIS — S0990XA Unspecified injury of head, initial encounter: Secondary | ICD-10-CM | POA: Diagnosis present

## 2014-11-20 DIAGNOSIS — G309 Alzheimer's disease, unspecified: Secondary | ICD-10-CM | POA: Insufficient documentation

## 2014-11-20 DIAGNOSIS — W19XXXA Unspecified fall, initial encounter: Secondary | ICD-10-CM

## 2014-11-20 DIAGNOSIS — S0101XA Laceration without foreign body of scalp, initial encounter: Secondary | ICD-10-CM | POA: Diagnosis not present

## 2014-11-20 DIAGNOSIS — F0390 Unspecified dementia without behavioral disturbance: Secondary | ICD-10-CM

## 2014-11-20 DIAGNOSIS — Y9389 Activity, other specified: Secondary | ICD-10-CM | POA: Insufficient documentation

## 2014-11-20 DIAGNOSIS — Z79899 Other long term (current) drug therapy: Secondary | ICD-10-CM | POA: Diagnosis not present

## 2014-11-20 DIAGNOSIS — Z950 Presence of cardiac pacemaker: Secondary | ICD-10-CM | POA: Diagnosis not present

## 2014-11-20 DIAGNOSIS — Y92129 Unspecified place in nursing home as the place of occurrence of the external cause: Secondary | ICD-10-CM | POA: Diagnosis not present

## 2014-11-20 NOTE — ED Notes (Signed)
Bed: FX25 Expected date:  Expected time:  Means of arrival:  Comments: EMS 81 yo female unwitnessed fall

## 2014-11-20 NOTE — ED Provider Notes (Signed)
CSN: [id number]     Arrival date & time 11/20/14  2301 History   First MD Initiated Contact with Patient 11/20/14 2318     Chief Complaint  Patient presents with  . Fall     (Consider location/radiation/quality/duration/timing/severity/associated sxs/prior Treatment) Patient is a 81 y.o. female presenting with fall. The history is provided by the patient. No language interpreter was used.  Fall  Ms. Redondo is an 81 y.o female with a history of Alzheimer's disease, schizoaffective disorder, and diabetes who presents from [facility] for unwitnessed fall out of the chair that occurred prior to arrival. She is complaining of back and hip pain. She also has a laceration to the back of her head with controlled bleeding at this time. She is not on any anticoagulation medications. Level V caveat Past Medical History  Diagnosis Date  . Diabetes mellitus without complication   . Alzheimer disease   . Schizo affective schizophrenia    Past Surgical History  Procedure Laterality Date  . Hernia repair    . Pacemaker insertion     History reviewed. No pertinent family history. Social History  Substance Use Topics  . Smoking status: Former Research scientist (life sciences)  . Smokeless tobacco: None  . Alcohol Use: No   OB History    No data available     Review of Systems  Unable to perform ROS: Dementia      Allergies  Penicillins  Home Medications   Prior to Admission medications   Medication Sig Start Date End Date Taking? Authorizing Provider  acetaminophen (TYLENOL) 325 MG tablet Take 650 mg by mouth every 4 (four) hours as needed for mild pain.   Yes Historical Provider, MD  benztropine (COGENTIN) 1 MG tablet Take 1 mg by mouth 2 (two) times daily.   Yes Historical Provider, MD  chlordiazePOXIDE (LIBRIUM) 25 MG capsule Take 25 mg by mouth every evening. At 7pm   Yes Historical Provider, MD  docusate sodium (COLACE) 100 MG capsule Take 100 mg by mouth 2 (two) times daily.   Yes  Historical Provider, MD  lactulose (CHRONULAC) 10 GM/15ML solution Take 20 g by mouth 2 (two) times daily.   Yes Historical Provider, MD  levothyroxine (SYNTHROID, LEVOTHROID) 75 MCG tablet Take 75 mcg by mouth daily before breakfast.   Yes Historical Provider, MD  OLANZapine (ZYPREXA) 5 MG tablet Take 2.5-5 mg by mouth 3 (three) times daily. Give 1 tab ('5mg'$ ) every morning at 7 am, 1/2 tab (2.'5mg'$ ) at lunch and 1 tab ('5mg'$ ) every evening at 7 pm.   Yes Historical Provider, MD  OLANZapine (ZYPREXA) 5 MG tablet Take 2.5 mg by mouth 2 (two) times daily as needed (anxiety/agitation).   Yes Historical Provider, MD  pantoprazole (PROTONIX) 40 MG tablet Take 40 mg by mouth daily.   Yes Historical Provider, MD  sodium chloride 1 G tablet Take 1 g by mouth 3 (three) times daily.   Yes Historical Provider, MD  prednisoLONE (PRELONE) 15 MG/5ML SOLN Take 15 mLs (45 mg total) by mouth daily before breakfast. 11/21/14   Charlann Lange, PA-C   BP 115/77 mmHg  Pulse 79  Temp(Src) 100.2 F (37.9 C) (Rectal)  Resp 18  SpO2 100% Physical Exam  Constitutional: She appears well-developed and well-nourished.  HENT:  Head: Normocephalic. Head is with laceration. Head is without raccoon's eyes, without Battle's sign, without abrasion, without right periorbital erythema and without left periorbital erythema. Hair is abnormal.    1.5 cm laceration to the posterior scalp  with minimal active bleeding.  Eyes: Conjunctivae are normal.  Neck: Normal range of motion. Neck supple.  Cardiovascular: Normal rate, regular rhythm and normal heart sounds.   Pulmonary/Chest: Effort normal and breath sounds normal. No respiratory distress. She has no wheezes. She has no rales.  Abdominal: Soft. She exhibits no distension. There is no tenderness.  Musculoskeletal: Normal range of motion.  Neurological: She is alert.  Dementia at baseline.  She is alert but not oriented x3.  She is moving all extremities.   Skin: Skin is warm and  dry. Rash noted. No petechiae and no purpura noted. Rash is macular. Rash is not pustular and not vesicular.  Bilateral lower extremity erythematous, blanching, macular rash without drainage. No petechiae. Patient is scratching her legs.  Psychiatric: She has a normal mood and affect. Her behavior is normal.  Nursing note and vitals reviewed.   ED Course  Procedures (including critical care time)  LACERATION REPAIR Performed by: Ottie Glazier Authorized by: Ottie Glazier Consent: Verbal consent obtained. Risks and benefits: risks, benefits and alternatives were discussed Consent given by: patient Patient identity confirmed: provided demographic data Prepped and Draped in normal sterile fashion Wound explored Laceration Location: mid posterior scalp Laceration Length: 1.5cm No Foreign Bodies seen or palpated Anesthesia: No local anesthesia was used Irrigation method: syringe Amount of cleaning: standard Skin closure: staples Number of staples: 4 Patient tolerance: Patient tolerated the procedure well with no immediate complications. Bleeding controlled.  No foreign bodies seen or palpated.  Labs Review Labs Reviewed - No data to display  Imaging Review Ct Head Wo Contrast  11/21/2014   CLINICAL DATA:  Unwitnessed fall out of a chair to the floor.  EXAM: CT HEAD WITHOUT CONTRAST  CT CERVICAL SPINE WITHOUT CONTRAST  TECHNIQUE: Multidetector CT imaging of the head and cervical spine was performed following the standard protocol without intravenous contrast. Multiplanar CT image reconstructions of the cervical spine were also generated.  COMPARISON:  None.  FINDINGS: CT HEAD FINDINGS  There is no intracranial hemorrhage, mass or evidence of acute infarction. There is moderate generalized atrophy. There is moderate chronic microvascular ischemic change. There is no significant extra-axial fluid collection.  No acute intracranial findings are evident. The calvarium and skullbase  are intact.  CT CERVICAL SPINE FINDINGS  The vertebral column, pedicles and facet articulations are intact. There is no evidence of acute fracture. No acute soft tissue abnormalities are evident.  Mild cervical facet arthritis is present, greatest at C5-6 on the right.  IMPRESSION: 1. Negative for acute intracranial traumatic injury. There is moderate generalized atrophy and chronic small vessel disease. 2. Negative for acute cervical spine fracture.   Electronically Signed   By: Andreas [name] M.D.   On: 11/21/2014 02:24   Ct Cervical Spine Wo Contrast  11/21/2014   CLINICAL DATA:  Unwitnessed fall out of a chair to the floor.  EXAM: CT HEAD WITHOUT CONTRAST  CT CERVICAL SPINE WITHOUT CONTRAST  TECHNIQUE: Multidetector CT imaging of the head and cervical spine was performed following the standard protocol without intravenous contrast. Multiplanar CT image reconstructions of the cervical spine were also generated.  COMPARISON:  None.  FINDINGS: CT HEAD FINDINGS  There is no intracranial hemorrhage, mass or evidence of acute infarction. There is moderate generalized atrophy. There is moderate chronic microvascular ischemic change. There is no significant extra-axial fluid collection.  No acute intracranial findings are evident. The calvarium and skullbase are intact.  CT CERVICAL SPINE FINDINGS  The vertebral column, pedicles  and facet articulations are intact. There is no evidence of acute fracture. No acute soft tissue abnormalities are evident.  Mild cervical facet arthritis is present, greatest at C5-6 on the right.  IMPRESSION: 1. Negative for acute intracranial traumatic injury. There is moderate generalized atrophy and chronic small vessel disease. 2. Negative for acute cervical spine fracture.   Electronically Signed   By: Andreas [name] M.D.   On: 11/21/2014 02:24   I have personally reviewed and evaluated these images as part of my medical decision-making.   EKG Interpretation None      MDM    Final diagnoses:  Fall, initial encounter  Scalp laceration, initial encounter  Dementia, without behavioral disturbance  Skin rash   Patient presents for head laceration after fall at nursing home.  She is demented at baseline and cannot answer questions. She is stable and in no acute distress.  I treated her rash while in the ED.  I signed this patient out to Charlann Lange, PA-C and the plan is to discharge the patient back to the nursing home if CT head and neck are normal.     Ottie Glazier, PA-C 11/21/14 [address] PA-C 11/21/14 1345  Julianne Rice, MD 11/24/14 2302

## 2014-11-20 NOTE — ED Notes (Signed)
Pt is from [facility] on [address] and had an unwitnessed fall tonight out of a chair. Now c/o back of head pain. Laceration noted. Bleeding controlled at this time. No blood thinners. Alzheimer's dementia at baseline.

## 2014-11-21 ENCOUNTER — Encounter (HOSPITAL_COMMUNITY): Payer: Self-pay | Admitting: Radiology

## 2014-11-21 ENCOUNTER — Emergency Department (HOSPITAL_COMMUNITY): Payer: Medicare Other

## 2014-11-21 DIAGNOSIS — S0101XA Laceration without foreign body of scalp, initial encounter: Secondary | ICD-10-CM | POA: Diagnosis not present

## 2014-11-21 MED ORDER — BACITRACIN ZINC 500 UNIT/GM EX OINT
TOPICAL_OINTMENT | CUTANEOUS | Status: AC
  Administered 2014-11-21: 03:00:00
  Filled 2014-11-21: qty 0.9

## 2014-11-21 MED ORDER — DIPHENHYDRAMINE HCL 12.5 MG/5ML PO ELIX
25.0000 mg | ORAL_SOLUTION | Freq: Once | ORAL | Status: AC
  Administered 2014-11-21: 25 mg via ORAL
  Filled 2014-11-21: qty 10

## 2014-11-21 MED ORDER — PREDNISOLONE 15 MG/5ML PO SOLN
15.0000 mg | Freq: Once | ORAL | Status: AC
  Administered 2014-11-21: 15 mg via ORAL
  Filled 2014-11-21: qty 1

## 2014-11-21 MED ORDER — PREDNISOLONE 15 MG/5ML PO SOLN
45.0000 mg | Freq: Every day | ORAL | Status: DC
Start: 2014-11-21 — End: 2014-12-30

## 2014-11-21 NOTE — ED Provider Notes (Signed)
Fall - unwitnessed, NH resident Lac on head, repaired - no anticoagulation Severe dementia at baseline  CT head/neck pending Rash - prednisone, benadryl  CT scans negative. Will discharge home according to plan established by evaluating PA, Maximiano Coss.   Charlann Lange, PA-C 11/21/14 7034  Julianne Rice, MD 11/21/14 [phone]

## 2014-11-21 NOTE — Discharge Instructions (Signed)
Fall Prevention and Home Safety Falls cause injuries and can affect all age groups. It is possible to use preventive measures to significantly decrease the likelihood of falls. There are many simple measures which can make your home safer and prevent falls. OUTDOORS  Repair cracks and edges of walkways and driveways.  Remove high doorway thresholds.  Trim shrubbery on the main path into your home.  Have good outside lighting.  Clear walkways of tools, rocks, debris, and clutter.  Check that handrails are not broken and are securely fastened. Both sides of steps should have handrails.  Have leaves, snow, and ice cleared regularly.  Use sand or salt on walkways during winter months.  In the garage, clean up grease or oil spills. BATHROOM  Install night lights.  Install grab bars by the toilet and in the tub and shower.  Use non-skid mats or decals in the tub or shower.  Place a plastic non-slip stool in the shower to sit on, if needed.  Keep floors dry and clean up all water on the floor immediately.  Remove soap buildup in the tub or shower on a regular basis.  Secure bath mats with non-slip, double-sided rug tape.  Remove throw rugs and tripping hazards from the floors. BEDROOMS  Install night lights.  Make sure a bedside light is easy to reach.  Do not use oversized bedding.  Keep a telephone by your bedside.  Have a firm chair with side arms to use for getting dressed.  Remove throw rugs and tripping hazards from the floor. KITCHEN  Keep handles on pots and pans turned toward the center of the stove. Use back burners when possible.  Clean up spills quickly and allow time for drying.  Avoid walking on wet floors.  Avoid hot utensils and knives.  Position shelves so they are not too high or low.  Place commonly used objects within easy reach.  If necessary, use a sturdy step stool with a grab bar when reaching.  Keep electrical cables out of the  way.  Do not use floor polish or wax that makes floors slippery. If you must use wax, use non-skid floor wax.  Remove throw rugs and tripping hazards from the floor. STAIRWAYS  Never leave objects on stairs.  Place handrails on both sides of stairways and use them. Fix any loose handrails. Make sure handrails on both sides of the stairways are as long as the stairs.  Check carpeting to make sure it is firmly attached along stairs. Make repairs to worn or loose carpet promptly.  Avoid placing throw rugs at the top or bottom of stairways, or properly secure the rug with carpet tape to prevent slippage. Get rid of throw rugs, if possible.  Have an electrician put in a light switch at the top and bottom of the stairs. OTHER FALL PREVENTION TIPS  Wear low-heel or rubber-soled shoes that are supportive and fit well. Wear closed toe shoes.  When using a stepladder, make sure it is fully opened and both spreaders are firmly locked. Do not climb a closed stepladder.  Add color or contrast paint or tape to grab bars and handrails in your home. Place contrasting color strips on first and last steps.  Learn and use mobility aids as needed. Install an electrical emergency response system.  Turn on lights to avoid dark areas. Replace light bulbs that burn out immediately. Get light switches that glow.  Arrange furniture to create clear pathways. Keep furniture in the same place.  Firmly attach carpet with non-skid or double-sided tape.  Eliminate uneven floor surfaces.  Select a carpet pattern that does not visually hide the edge of steps.  Be aware of all pets. OTHER HOME SAFETY TIPS  Set the water temperature for 120 F (48.8 C).  Keep emergency numbers on or near the telephone.  Keep smoke detectors on every level of the home and near sleeping areas. Document Released: 02/26/2002 Document Revised: 09/07/2011 Document Reviewed: 05/28/2011 [facility] Patient Information 2015  [place], [place]. This information is not intended to replace advice given to you by your health care provider. Make sure you discuss any questions you have with your health care provider. Staple Care and Removal Your caregiver has used staples today to repair your wound. Staples are used to help a wound heal faster by holding the edges of the wound together. The staples can be removed when the wound has healed well enough to stay together after the staples are removed. A dressing (wound covering), depending on the location of the wound, may have been applied. This may be changed once per day or as instructed. If the dressing sticks, it may be soaked off with soapy water or hydrogen peroxide. Only take over-the-counter or prescription medicines for pain, discomfort, or fever as directed by your caregiver.  If you did not receive a tetanus shot today because you did not recall when your last one was given, check with your caregiver when you have your staples removed to determine if one is needed. Return to your caregiver's office in 1 week or as suggested to have your staples removed. SEEK IMMEDIATE MEDICAL CARE IF:   You have redness, swelling, or increasing pain in the wound.  You have pus coming from the wound.  You have a fever.  You notice a bad smell coming from the wound or dressing.  Your wound edges break open after staples have been removed. Document Released: 12/01/2000 Document Revised: 05/31/2011 Document Reviewed: 12/16/2004 [facility] Patient Information 2015 [place], [place]. This information is not intended to replace advice given to you by your health care provider. Make sure you discuss any questions you have with your health care provider. Rash A rash is a change in the color or texture of your skin. There are many different types of rashes. You may have other problems that accompany your rash. CAUSES   Infections.  Allergic reactions. This can include allergies to pets or  foods.  Certain medicines.  Exposure to certain chemicals, soaps, or cosmetics.  Heat.  Exposure to poisonous plants.  Tumors, both cancerous and noncancerous. SYMPTOMS   Redness.  Scaly skin.  Itchy skin.  Dry or cracked skin.  Bumps.  Blisters.  Pain. DIAGNOSIS  Your caregiver may do a physical exam to determine what type of rash you have. A skin sample (biopsy) may be taken and examined under a microscope. TREATMENT  Treatment depends on the type of rash you have. Your caregiver may prescribe certain medicines. For serious conditions, you may need to see a skin doctor (dermatologist). HOME CARE INSTRUCTIONS   Avoid the substance that caused your rash.  Do not scratch your rash. This can cause infection.  You may take cool baths to help stop itching.  Only take over-the-counter or prescription medicines as directed by your caregiver.  Keep all follow-up appointments as directed by your caregiver. SEEK IMMEDIATE MEDICAL CARE IF:  You have increasing pain, swelling, or redness.  You have a fever.  You have new or severe symptoms.  You have body aches, diarrhea, or vomiting.  Your rash is not better after 3 days. MAKE SURE YOU:  Understand these instructions.  Will watch your condition.  Will get help right away if you are not doing well or get worse. Document Released: 02/26/2002 Document Revised: 05/31/2011 Document Reviewed: 12/21/2010 [facility] Patient Information 2015 [place], [place]. This information is not intended to replace advice given to you by your health care provider. Make sure you discuss any questions you have with your health care provider.

## 2014-12-30 ENCOUNTER — Emergency Department (HOSPITAL_COMMUNITY): Payer: Medicare Other

## 2014-12-30 ENCOUNTER — Encounter (HOSPITAL_COMMUNITY): Payer: Self-pay | Admitting: Family Medicine

## 2014-12-30 ENCOUNTER — Inpatient Hospital Stay (HOSPITAL_COMMUNITY): Payer: Medicare Other

## 2014-12-30 ENCOUNTER — Inpatient Hospital Stay (HOSPITAL_COMMUNITY)
Admission: EM | Admit: 2014-12-30 | Discharge: 2015-01-02 | DRG: 177 | Disposition: A | Discharge status: Hospice | Payer: Medicare Other | Attending: Internal Medicine | Admitting: Internal Medicine

## 2014-12-30 DIAGNOSIS — F259 Schizoaffective disorder, unspecified: Secondary | ICD-10-CM | POA: Diagnosis present

## 2014-12-30 DIAGNOSIS — G309 Alzheimer's disease, unspecified: Secondary | ICD-10-CM | POA: Diagnosis present

## 2014-12-30 DIAGNOSIS — F0280 Dementia in other diseases classified elsewhere without behavioral disturbance: Secondary | ICD-10-CM | POA: Diagnosis present

## 2014-12-30 DIAGNOSIS — M1990 Unspecified osteoarthritis, unspecified site: Secondary | ICD-10-CM | POA: Diagnosis present

## 2014-12-30 DIAGNOSIS — S81812A Laceration without foreign body, left lower leg, initial encounter: Secondary | ICD-10-CM | POA: Diagnosis present

## 2014-12-30 DIAGNOSIS — I499 Cardiac arrhythmia, unspecified: Secondary | ICD-10-CM | POA: Diagnosis present

## 2014-12-30 DIAGNOSIS — Y92122 Bedroom in nursing home as the place of occurrence of the external cause: Secondary | ICD-10-CM

## 2014-12-30 DIAGNOSIS — N3281 Overactive bladder: Secondary | ICD-10-CM | POA: Diagnosis present

## 2014-12-30 DIAGNOSIS — R911 Solitary pulmonary nodule: Secondary | ICD-10-CM

## 2014-12-30 DIAGNOSIS — E222 Syndrome of inappropriate secretion of antidiuretic hormone: Secondary | ICD-10-CM | POA: Diagnosis present

## 2014-12-30 DIAGNOSIS — Z79899 Other long term (current) drug therapy: Secondary | ICD-10-CM

## 2014-12-30 DIAGNOSIS — F200 Paranoid schizophrenia: Secondary | ICD-10-CM | POA: Diagnosis present

## 2014-12-30 DIAGNOSIS — S0081XA Abrasion of other part of head, initial encounter: Secondary | ICD-10-CM | POA: Diagnosis present

## 2014-12-30 DIAGNOSIS — R1310 Dysphagia, unspecified: Secondary | ICD-10-CM | POA: Diagnosis present

## 2014-12-30 DIAGNOSIS — Z8673 Personal history of transient ischemic attack (TIA), and cerebral infarction without residual deficits: Secondary | ICD-10-CM | POA: Diagnosis not present

## 2014-12-30 DIAGNOSIS — Z515 Encounter for palliative care: Secondary | ICD-10-CM | POA: Diagnosis present

## 2014-12-30 DIAGNOSIS — J189 Pneumonia, unspecified organism: Secondary | ICD-10-CM | POA: Diagnosis present

## 2014-12-30 DIAGNOSIS — K449 Diaphragmatic hernia without obstruction or gangrene: Secondary | ICD-10-CM | POA: Diagnosis present

## 2014-12-30 DIAGNOSIS — Z880 Allergy status to penicillin: Secondary | ICD-10-CM

## 2014-12-30 DIAGNOSIS — W19XXXA Unspecified fall, initial encounter: Secondary | ICD-10-CM | POA: Diagnosis present

## 2014-12-30 DIAGNOSIS — J690 Pneumonitis due to inhalation of food and vomit: Principal | ICD-10-CM | POA: Diagnosis present

## 2014-12-30 DIAGNOSIS — Z950 Presence of cardiac pacemaker: Secondary | ICD-10-CM

## 2014-12-30 DIAGNOSIS — Z87891 Personal history of nicotine dependence: Secondary | ICD-10-CM

## 2014-12-30 DIAGNOSIS — R1319 Other dysphagia: Secondary | ICD-10-CM | POA: Diagnosis present

## 2014-12-30 DIAGNOSIS — Z66 Do not resuscitate: Secondary | ICD-10-CM | POA: Diagnosis not present

## 2014-12-30 DIAGNOSIS — R918 Other nonspecific abnormal finding of lung field: Secondary | ICD-10-CM | POA: Diagnosis not present

## 2014-12-30 DIAGNOSIS — R590 Localized enlarged lymph nodes: Secondary | ICD-10-CM | POA: Diagnosis present

## 2014-12-30 DIAGNOSIS — R0902 Hypoxemia: Secondary | ICD-10-CM

## 2014-12-30 DIAGNOSIS — E039 Hypothyroidism, unspecified: Secondary | ICD-10-CM | POA: Diagnosis present

## 2014-12-30 DIAGNOSIS — E119 Type 2 diabetes mellitus without complications: Secondary | ICD-10-CM

## 2014-12-30 DIAGNOSIS — K219 Gastro-esophageal reflux disease without esophagitis: Secondary | ICD-10-CM | POA: Diagnosis present

## 2014-12-30 DIAGNOSIS — F250 Schizoaffective disorder, bipolar type: Secondary | ICD-10-CM

## 2014-12-30 DIAGNOSIS — K222 Esophageal obstruction: Secondary | ICD-10-CM | POA: Diagnosis present

## 2014-12-30 DIAGNOSIS — R627 Adult failure to thrive: Secondary | ICD-10-CM | POA: Diagnosis present

## 2014-12-30 DIAGNOSIS — J13 Pneumonia due to Streptococcus pneumoniae: Secondary | ICD-10-CM | POA: Insufficient documentation

## 2014-12-30 DIAGNOSIS — J9601 Acute respiratory failure with hypoxia: Secondary | ICD-10-CM | POA: Diagnosis present

## 2014-12-30 HISTORY — DX: Unspecified osteoarthritis, unspecified site: M19.90

## 2014-12-30 HISTORY — DX: Other dysphagia: R13.19

## 2014-12-30 HISTORY — DX: Gastro-esophageal reflux disease without esophagitis: K21.9

## 2014-12-30 HISTORY — DX: Pneumonia due to Streptococcus pneumoniae: J13

## 2014-12-30 HISTORY — DX: Pneumonia, unspecified organism: J18.9

## 2014-12-30 HISTORY — DX: Esophageal obstruction: K22.2

## 2014-12-30 HISTORY — DX: Urinary tract infection, site not specified: N39.0

## 2014-12-30 HISTORY — DX: Hypothyroidism, unspecified: E03.9

## 2014-12-30 HISTORY — DX: Diaphragmatic hernia without obstruction or gangrene: K44.9

## 2014-12-30 HISTORY — DX: Bacteremia: R78.81

## 2014-12-30 HISTORY — DX: Cerebral infarction, unspecified: I63.9

## 2014-12-30 LAB — URINALYSIS, ROUTINE W REFLEX MICROSCOPIC
BILIRUBIN URINE: NEGATIVE
GLUCOSE, UA: NEGATIVE mg/dL
HGB URINE DIPSTICK: NEGATIVE
Ketones, ur: NEGATIVE mg/dL
Leukocytes, UA: NEGATIVE
Nitrite: NEGATIVE
Protein, ur: NEGATIVE mg/dL
Specific Gravity, Urine: 1.011 (ref 1.005–1.030)
UROBILINOGEN UA: 1 mg/dL (ref 0.0–1.0)
pH: 6 (ref 5.0–8.0)

## 2014-12-30 LAB — CBC WITH DIFFERENTIAL/PLATELET
BASOS PCT: 0 %
Basophils Absolute: 0 10*3/uL (ref 0.0–0.1)
EOS ABS: 0.1 10*3/uL (ref 0.0–0.7)
Eosinophils Relative: 1 %
HEMATOCRIT: 36.1 % (ref 36.0–46.0)
Hemoglobin: 11.2 g/dL — ABNORMAL LOW (ref 12.0–15.0)
LYMPHS ABS: 1.3 10*3/uL (ref 0.7–4.0)
Lymphocytes Relative: 10 %
MCH: 24.8 pg — AB (ref 26.0–34.0)
MCHC: 31 g/dL (ref 30.0–36.0)
MCV: 80 fL (ref 78.0–100.0)
MONO ABS: 1.1 10*3/uL — AB (ref 0.1–1.0)
MONOS PCT: 8 %
Neutro Abs: 10.1 10*3/uL — ABNORMAL HIGH (ref 1.7–7.7)
Neutrophils Relative %: 81 %
Platelets: 227 10*3/uL (ref 150–400)
RBC: 4.51 MIL/uL (ref 3.87–5.11)
RDW: 15.9 % — AB (ref 11.5–15.5)
WBC: 12.6 10*3/uL — ABNORMAL HIGH (ref 4.0–10.5)

## 2014-12-30 LAB — COMPREHENSIVE METABOLIC PANEL
ALBUMIN: 2.8 g/dL — AB (ref 3.5–5.0)
ALK PHOS: 207 U/L — AB (ref 38–126)
ALT: 10 U/L — AB (ref 14–54)
AST: 25 U/L (ref 15–41)
Anion gap: 8 (ref 5–15)
BUN: 10 mg/dL (ref 6–20)
CALCIUM: 8.7 mg/dL — AB (ref 8.9–10.3)
CHLORIDE: 101 mmol/L (ref 101–111)
CO2: 27 mmol/L (ref 22–32)
CREATININE: 0.78 mg/dL (ref 0.44–1.00)
GFR calc Af Amer: >60 mL/min (ref >60)
GFR calc non Af Amer: >60 mL/min (ref >60)
GLUCOSE: 107 mg/dL — AB (ref 65–99)
Potassium: 4 mmol/L (ref 3.5–5.1)
SODIUM: 136 mmol/L (ref 135–145)
Total Bilirubin: 0.4 mg/dL (ref 0.3–1.2)
Total Protein: 7.8 g/dL (ref 6.5–8.1)

## 2014-12-30 LAB — URIC ACID: Uric Acid, Serum: 5.1 mg/dL (ref 2.3–6.6)

## 2014-12-30 LAB — TROPONIN I

## 2014-12-30 LAB — BRAIN NATRIURETIC PEPTIDE: B Natriuretic Peptide: 113.8 pg/mL — ABNORMAL HIGH (ref 0.0–100.0)

## 2014-12-30 MED ORDER — RIVASTIGMINE 4.6 MG/24HR TD PT24
4.6000 mg | MEDICATED_PATCH | Freq: Every day | TRANSDERMAL | Status: DC
  Administered 2014-12-30 – 2015-01-02 (×4): 4.6 mg via TRANSDERMAL
  Filled 2014-12-30 (×4): qty 1

## 2014-12-30 MED ORDER — LEVOTHYROXINE SODIUM 25 MCG PO TABS
75.0000 ug | ORAL_TABLET | Freq: Every day | ORAL | Status: DC
  Administered 2014-12-31 – 2015-01-02 (×3): 75 ug via ORAL
  Filled 2014-12-30 (×4): qty 3

## 2014-12-30 MED ORDER — TRAZODONE HCL 50 MG PO TABS
50.0000 mg | ORAL_TABLET | Freq: Three times a day (TID) | ORAL | Status: DC | PRN
  Administered 2014-12-30: 50 mg via ORAL
  Filled 2014-12-30: qty 1

## 2014-12-30 MED ORDER — OXYBUTYNIN CHLORIDE 5 MG PO TABS
2.5000 mg | ORAL_TABLET | Freq: Three times a day (TID) | ORAL | Status: DC
  Administered 2014-12-30 – 2014-12-31 (×3): 2.5 mg via ORAL
  Filled 2014-12-30 (×3): qty 1

## 2014-12-30 MED ORDER — CHLORDIAZEPOXIDE HCL 25 MG PO CAPS
25.0000 mg | ORAL_CAPSULE | ORAL | Status: DC
  Administered 2014-12-30 – 2015-01-01 (×3): 25 mg via ORAL
  Filled 2014-12-30 (×3): qty 1

## 2014-12-30 MED ORDER — GUAIFENESIN ER 600 MG PO TB12
600.0000 mg | ORAL_TABLET | Freq: Two times a day (BID) | ORAL | Status: DC
  Administered 2014-12-31 – 2015-01-02 (×5): 600 mg via ORAL
  Filled 2014-12-30 (×5): qty 1

## 2014-12-30 MED ORDER — ACETAMINOPHEN 500 MG PO TABS
500.0000 mg | ORAL_TABLET | Freq: Three times a day (TID) | ORAL | Status: DC
  Administered 2014-12-30 – 2015-01-02 (×9): 500 mg via ORAL
  Filled 2014-12-30 (×9): qty 1

## 2014-12-30 MED ORDER — LEVOFLOXACIN IN D5W 750 MG/150ML IV SOLN
750.0000 mg | INTRAVENOUS | Status: AC
  Administered 2014-12-30: 750 mg via INTRAVENOUS
  Filled 2014-12-30: qty 150

## 2014-12-30 MED ORDER — SODIUM CHLORIDE 1 G PO TABS
1.0000 g | ORAL_TABLET | Freq: Three times a day (TID) | ORAL | Status: DC
Start: 2014-12-30 — End: 2015-01-02
  Administered 2014-12-31 – 2015-01-02 (×7): 1 g via ORAL
  Filled 2014-12-30 (×12): qty 1

## 2014-12-30 MED ORDER — SODIUM CHLORIDE 0.9 % IV BOLUS (SEPSIS)
500.0000 mL | Freq: Once | INTRAVENOUS | Status: AC
  Administered 2014-12-30: 500 mL via INTRAVENOUS

## 2014-12-30 MED ORDER — BENZTROPINE MESYLATE 1 MG PO TABS
1.0000 mg | ORAL_TABLET | Freq: Two times a day (BID) | ORAL | Status: DC
  Administered 2014-12-31 – 2015-01-02 (×5): 1 mg via ORAL
  Filled 2014-12-30 (×8): qty 1

## 2014-12-30 MED ORDER — LEVOFLOXACIN IN D5W 750 MG/150ML IV SOLN: 750.0000 mg | Freq: Every day | INTRAVENOUS | Status: DC

## 2014-12-30 MED ORDER — SACCHAROMYCES BOULARDII 250 MG PO CAPS
250.0000 mg | ORAL_CAPSULE | Freq: Two times a day (BID) | ORAL | Status: DC
  Administered 2014-12-30 – 2015-01-02 (×6): 250 mg via ORAL
  Filled 2014-12-30 (×6): qty 1

## 2014-12-30 MED ORDER — PANTOPRAZOLE SODIUM 40 MG PO TBEC
40.0000 mg | DELAYED_RELEASE_TABLET | Freq: Every day | ORAL | Status: DC
  Administered 2014-12-31 – 2015-01-02 (×3): 40 mg via ORAL
  Filled 2014-12-30 (×3): qty 1

## 2014-12-30 MED ORDER — DOCUSATE SODIUM 100 MG PO CAPS
100.0000 mg | ORAL_CAPSULE | Freq: Two times a day (BID) | ORAL | Status: DC
  Administered 2014-12-31 – 2015-01-02 (×5): 100 mg via ORAL
  Filled 2014-12-30 (×5): qty 1

## 2014-12-30 MED ORDER — IOHEXOL 300 MG/ML  SOLN
75.0000 mL | Freq: Once | INTRAMUSCULAR | Status: AC | PRN
  Administered 2014-12-30: 75 mL via INTRAVENOUS

## 2014-12-30 MED ORDER — OLANZAPINE 5 MG PO TABS
20.0000 mg | ORAL_TABLET | Freq: Every day | ORAL | Status: DC
  Administered 2014-12-30 – 2015-01-01 (×3): 20 mg via ORAL
  Filled 2014-12-30 (×3): qty 4

## 2014-12-30 MED ORDER — TRAMADOL HCL 50 MG PO TABS: 50.0000 mg | ORAL_TABLET | Freq: Two times a day (BID) | ORAL | Status: DC | PRN

## 2014-12-30 MED ORDER — ENOXAPARIN SODIUM 40 MG/0.4ML ~~LOC~~ SOLN
40.0000 mg | SUBCUTANEOUS | Status: DC
  Administered 2014-12-30 – 2015-01-01 (×3): 40 mg via SUBCUTANEOUS
  Filled 2014-12-30 (×3): qty 0.4

## 2014-12-30 MED ORDER — CLINDAMYCIN PHOSPHATE 600 MG/50ML IV SOLN
600.0000 mg | Freq: Three times a day (TID) | INTRAVENOUS | Status: DC
Start: 2014-12-30 — End: 2015-01-01
  Administered 2014-12-30 – 2015-01-01 (×7): 600 mg via INTRAVENOUS
  Filled 2014-12-30 (×7): qty 50

## 2014-12-30 MED ORDER — HYDROCOD POLST-CPM POLST ER 10-8 MG/5ML PO SUER: 2.5000 mL | Freq: Two times a day (BID) | ORAL | Status: DC | PRN

## 2014-12-30 NOTE — H&P (Signed)
[facility] History and Physical  Jill Copeland KGM:[id number] DOB: Nov 25, 1931 DOA: 12/30/2014  Referring physician:  Davonna Belling PCP:  PROVIDER NOT IN SYSTEM   Chief Complaint:  Fall, cough  HPI:  The patient is a 81 y.o. year-old female with history of paranoid schizophrenia requiring multiple visits to the emergency department, inpatient hospitalizations, currently living at [facility], Alzheimer's dementia, diabetes mellitus type 2 without complication, hiatal hernia status post several unsuccessful corrective surgeries, dysphagia with recurrent aspiration pneumonia and previously had a jejunostomy tube which has subsequently been removed, esophageal stricture status post multiple dilations, GERD, arrhythmia with associated syncope status post pacemaker insertion, stroke, recurrent pneumonia with bacteremia without reported endocarditis, no history of DVT or pulmonary embolism, hypothyroidism who presents with cough and fall.  The patient has had a progressive decline over the last several years, and was recently transitioned from home to facility who could care for her secondary to her mental health issues. She ambulates with a walker and has problems with overactive bladder and chronically leaking urine.  Over the last 2 weeks according to the family member she has had a cough with wheezing and some shortness of breath described as fast breathing. The daughter is not sure if she has been coughing anything up. She denies recent fevers.  She has had recurrent falls over the last several months which the daughter attributes to overmedication. She had an unwitnessed fall at her facility and was transported to the emergency department for evaluation.  In the emergency department, she was afebrile with stable vital signs except for hypoxia to 86% on room air. CT of the head demonstrated no acute abnormalities with stable atrophy, and arthritic changes. Incidentally she was noted to  have multifocal pneumonia of the right upper lobe. Chest had bilateral carotid artery calcifications. Chest x-ray demonstrated a 2 x 2 centimeter nodular opacity in the periphery of the right mid lung with no evidence of underlying congestive heart failure. The radiologist recommended a follow-up CT scan of the chest.  Chest CT demonstrated considerable mediastinal, hilar, and infrahilar adenopathy that could be reactive or malignant. She had scattered densities in both lungs some of which appeared nodular but with peripheral predominance and with some wedge shaped areas. This could represent pulmonary emboli with pulmonary infarcts. Other possibilities per the radiologist include multilobar pneumonia, inflammatory nodules, or underlying malignancy. She was also seen to have airway plugging of the right and left lower lobes. She had a moderate sized hiatal hernia and hepatic steatosis.  Review of Systems:  Limited by patient schizophrenia and dementia. She currently denies pain except in her back. She constantly feels like she has to go to the bathroom and repeatedly asked to get on and off the bedpan.    Past Medical History  Diagnosis Date  . Diabetes mellitus without complication ([place])   . Alzheimer disease   . Schizo affective schizophrenia ([place])   . Recurrent pneumonia   . Dysphagia causing pulmonary aspiration with swallowing   . Pneumococcal pneumonia ([place])   . Bacteremia     several timesf  . Recurrent UTI   . Stroke (cerebrum) ([place])   . Arrhythmia     with syncope s/p PPM  . GERD (gastroesophageal reflux disease)   . Esophageal stricture   . Hiatal hernia     s/p repair with fistula  . Arthritis   . Hypothyroidism    Past Surgical History  Procedure Laterality Date  . Hernia repair    . Pacemaker  insertion    . Jejunostomy feeding tube     Social History:  reports that she has never smoked. She does not have any smokeless tobacco history on file. She reports that she does not  drink alcohol or use illicit drugs. Currently living at [facility], however the family would like to move her to a different facility  Allergy:  PCN  Family History  Problem Relation Age of Onset  . Thyroid cancer Grandchild   . Thyroid nodules Daughter   . Thyroid cancer Daughter   . Dementia Mother   . Schizophrenia Maternal Aunt      Prior to Admission medications   Medication Sig Start Date End Date Taking? Authorizing Provider  acetaminophen (TYLENOL) 500 MG tablet Take 500 mg by mouth 3 (three) times daily.   Yes Historical Provider, MD  benztropine (COGENTIN) 1 MG tablet Take 1 mg by mouth 2 (two) times daily.   Yes Historical Provider, MD  chlordiazePOXIDE (LIBRIUM) 25 MG capsule Take 25 mg by mouth every evening. At 7pm   Yes Historical Provider, MD  docusate sodium (COLACE) 100 MG capsule Take 100 mg by mouth 2 (two) times daily.   Yes Historical Provider, MD  levothyroxine (SYNTHROID, LEVOTHROID) 75 MCG tablet Take 75 mcg by mouth daily before breakfast.   Yes Historical Provider, MD  OLANZapine (ZYPREXA) 20 MG tablet Take 20 mg by mouth at bedtime.   Yes Historical Provider, MD  pantoprazole (PROTONIX) 40 MG tablet Take 40 mg by mouth daily.   Yes Historical Provider, MD  rivastigmine (EXELON) 4.6 mg/24hr Place 4.6 mg onto the skin daily.   Yes Historical Provider, MD  sodium chloride 1 G tablet Take 1 g by mouth 3 (three) times daily.   Yes Historical Provider, MD  traMADol (ULTRAM) 50 MG tablet Take 50 mg by mouth 2 (two) times daily as needed for moderate pain.   Yes Historical Provider, MD  traZODone (DESYREL) 50 MG tablet Take 50 mg by mouth every 8 (eight) hours as needed. For agitation   Yes Historical Provider, MD  acetaminophen (TYLENOL) 325 MG tablet Take 650 mg by mouth every 4 (four) hours as needed for mild pain.    Historical Provider, MD  prednisoLONE (PRELONE) 15 MG/5ML SOLN Take 15 mLs (45 mg total) by mouth daily before breakfast. Patient not taking:  Reported on 12/30/2014 11/21/14   Charlann Lange, PA-C   Physical Exam: Filed Vitals:   12/30/14 6578 12/30/14 4696 12/30/14 0933 12/30/14 1013  BP: 112/65  131/58 128/62  Pulse: 75  66 73  Temp: 98 F (36.7 C)   97.6 F (36.4 C)  TempSrc: Oral   Oral  Resp: '17  17 18  '$ Weight:    53.8 kg (118 lb 9.7 oz)  SpO2: 86% 90% 95% 97%     General:  Adult female, no acute distress, able to sit up in bed with assistance  Eyes:  PERRL, anicteric, non-injected.  ENT:  Nares clear.  OP clear, non-erythematous without plaques or exudates.  MMM.  Neck:  Supple without TM or JVD.    Lymph:  No cervical, supraclavicular, or submandibular LAD.  Cardiovascular:  RRR, normal S1, S2, without m/r/g.  2+ pulses, warm extremities  Respiratory:  Rhonchorous breath sounds bilaterally with rales at the bilateral bases and mid back, no obvious wheezing, without increased WOB.  Abdomen:  NABS.  Soft, ND/NT.    Skin:  No rashes or focal lesions.  Musculoskeletal:  Normal bulk and tone.  No LE edema.  Psychiatric:  A & O to person but not to place or time.  Blunted affect.  Neurologic:  CN 3-12 intact.  4/5 strength throughout.  Sensation intact.  Labs on Admission:  Basic Metabolic Panel:  Recent Labs Lab 12/30/14 0728  NA 136  K 4.0  CL 101  CO2 27  GLUCOSE 107*  BUN 10  CREATININE 0.78  CALCIUM 8.7*   Liver Function Tests:  Recent Labs Lab 12/30/14 0728  AST 25  ALT 10*  ALKPHOS 207*  BILITOT 0.4  PROT 7.8  ALBUMIN 2.8*   No results for input(s): LIPASE, AMYLASE in the last 168 hours. No results for input(s): AMMONIA in the last 168 hours. CBC:  Recent Labs Lab 12/30/14 0728  WBC 12.6*  NEUTROABS 10.1*  HGB 11.2*  HCT 36.1  MCV 80.0  PLT 227   Cardiac Enzymes:  Recent Labs Lab 12/30/14 0728  TROPONINI <0.03    BNP (last 3 results)  Recent Labs  12/30/14 0728  BNP 113.8*    ProBNP (last 3 results) No results for input(s): PROBNP in the last 8760  hours.  CBG: No results for input(s): GLUCAP in the last 168 hours.  Radiological Exams on Admission: Dg Chest 1 View  12/30/2014   CLINICAL DATA:  Pain following fall.  Cough and congestion.  EXAM: CHEST 1 VIEW  COMPARISON:  None  FINDINGS: There is moderate interstitial edema with small pleural effusions bilaterally and borderline cardiomegaly. There is mild pulmonary venous hypertension.  In the periphery the right mid lung, there is a 2 x 2 cm nodular opacity. Pacemaker leads are attached to the right atrium right ventricle. No pneumothorax. No fractures are apparent.  IMPRESSION: 2 x 2 cm nodular opacity in the periphery the right midlung. Advise noncontrast enhanced chest CT to further assess. Underlying congestive heart failure. No pneumothorax.   Electronically Signed   By: Lowella Grip III M.D.   On: 12/30/2014 07:39   Ct Head Wo Contrast  12/30/2014   CLINICAL DATA:  Pain following fall  EXAM: CT HEAD WITHOUT CONTRAST  CT CERVICAL SPINE WITHOUT CONTRAST  TECHNIQUE: Multidetector CT imaging of the head and cervical spine was performed following the standard protocol without intravenous contrast. Multiplanar CT image reconstructions of the cervical spine were also generated.  COMPARISON:  CT head and CT cervical spine November 21, 2014 ; chest CT December 30, 2014  FINDINGS: CT HEAD FINDINGS  Mild diffuse atrophy is stable. There is no intracranial mass, hemorrhage, extra-axial fluid collection, or midline shift. There is patchy small vessel disease in the centra semiovale bilaterally. Elsewhere gray-white compartments are normal. No acute infarct apparent. Bony calvarium appears intact. The mastoid air cells are clear.  CT CERVICAL SPINE FINDINGS  There is no demonstrable fracture or spondylolisthesis. Prevertebral soft tissues and predental space regions are normal. There is facet hypertrophy at most levels bilaterally with exit foraminal narrowing due to bony hypertrophy at multiple  levels, stable. Exit foraminal narrowing due to bony hypertrophy is most severe at C5-6 on the right, stable. No frank disc extrusion or high-grade stenosis.  There is calcification in both carotid arteries.  There are areas of infiltrate in the right upper lobe in both the apical and posterior segmental regions.  IMPRESSION: CT head: Stable atrophy with patchy periventricular small vessel disease. No intracranial mass, hemorrhage, or extra-axial fluid collection. No new gray-white compartment lesion.  CT cervical spine: Multifocal osteoarthritic change, stable from recent prior study. Exit foraminal  narrowing greatest at C5-6 on the right due to bony hypertrophy. No fracture or spondylolisthesis.  Probable multifocal pneumonia right upper lobe. Please see chest radiograph report for recommendation for noncontrast enhanced chest CT to further evaluate nodular lesion more inferiorly.  Bilateral carotid artery calcification.   Electronically Signed   By: Lowella Grip III M.D.   On: 12/30/2014 08:13   Ct Chest W Contrast  12/30/2014   CLINICAL DATA:  Dementia, increased confusion. Hypoxia. Cough. Lung nodule. Recent pneumonia on cervical spine CT.  EXAM: CT CHEST WITH CONTRAST  TECHNIQUE: Multidetector CT imaging of the chest was performed during intravenous contrast administration.  CONTRAST:  61m OMNIPAQUE IOHEXOL 300 MG/ML  SOLN  COMPARISON:  12/30/2014  FINDINGS: Mediastinum/Nodes: Bilateral supraclavicular adenopathy, index left-sided node 1 cm in [place] axis, image 8 series 2. Prevascular, paratracheal, bilateral hilar, and right infrahilar adenopathy, right lower paratracheal lymph node Bruce [place]illa axis diameter 1.8 cm.  Atherosclerotic aortic arch.  Moderate-sized hiatal hernia eccentric to the left. Possible anastomotic staple line above this in the mid esophagus, correlate with operative history.  Pacer leads in place.  I do not see any large central pulmonary emboli, but today's exam was not performed as  a CT angiogram and is not considered sensitive for more peripheral pulmonary emboli.  Lungs/Pleura: Scattered densities are present in both lungs. Some of these are irregular in nodule, with marginal airspace opacities and dense central opacity, and especially along the periphery of the lung, to include a 2.5 by 2.3 cm nodular opacity in the apical segment right upper lobe ; a 1.9 by 1.4 cm opacity posteriorly in the right upper lobe (image 17, series 6). ; peripheral wedge-shaped opacities in the right middle lobe and right lower lobe ; and several smaller left lung peripheral airspace opacities. Scattered bandlike opacities in both lungs suggesting atelectasis.  Thinning of the left mainstem bronchus noted on image 22 series 6, with suspected plugging in the right lower lobe tracheobronchial tree on images 28-32 of series 6. Left lower lobe airway plugging on image 44 series 602.  Upper abdomen: Hepatic steatosis.  Musculoskeletal: Mild thoracic kyphosis. Mild thoracic spondylosis. Thinning of the left seventh rib posterolaterally.  IMPRESSION: 1. Considerable mediastinal, hilar, and infrahilar adenopathy could be reactive or malignant. 2. There scattered densities in both lungs, some of which are nodular, but with the defining characteristic being more peripheral and wedge-shaped. Certainly pulmonary emboli can cause hemorrhage/pulmonary infarcts in such a pattern, but I do not see any large central pulmonary emboli (exam not sensitive for smaller pulmonary emboli-not performed as a pulmonary angiogram). Other possibilities include multilobar pneumonia, inflammatory nodules, or underlying malignancy. Follow-up to clearance is recommended. 3. Airway plugging in the right lower lobe and left lower lobe. 4. Moderate-sized hiatal hernia eccentric to the left. 5. Hepatic steatosis.   Electronically Signed   By: WVan ClinesM.D.   On: 12/30/2014 10:03   Ct Cervical Spine Wo Contrast  12/30/2014   CLINICAL  DATA:  Pain following fall  EXAM: CT HEAD WITHOUT CONTRAST  CT CERVICAL SPINE WITHOUT CONTRAST  TECHNIQUE: Multidetector CT imaging of the head and cervical spine was performed following the standard protocol without intravenous contrast. Multiplanar CT image reconstructions of the cervical spine were also generated.  COMPARISON:  CT head and CT cervical spine November 21, 2014 ; chest CT December 30, 2014  FINDINGS: CT HEAD FINDINGS  Mild diffuse atrophy is stable. There is no intracranial mass, hemorrhage, extra-axial fluid collection, or midline shift.  There is patchy small vessel disease in the centra semiovale bilaterally. Elsewhere gray-white compartments are normal. No acute infarct apparent. Bony calvarium appears intact. The mastoid air cells are clear.  CT CERVICAL SPINE FINDINGS  There is no demonstrable fracture or spondylolisthesis. Prevertebral soft tissues and predental space regions are normal. There is facet hypertrophy at most levels bilaterally with exit foraminal narrowing due to bony hypertrophy at multiple levels, stable. Exit foraminal narrowing due to bony hypertrophy is most severe at C5-6 on the right, stable. No frank disc extrusion or high-grade stenosis.  There is calcification in both carotid arteries.  There are areas of infiltrate in the right upper lobe in both the apical and posterior segmental regions.  IMPRESSION: CT head: Stable atrophy with patchy periventricular small vessel disease. No intracranial mass, hemorrhage, or extra-axial fluid collection. No new gray-white compartment lesion.  CT cervical spine: Multifocal osteoarthritic change, stable from recent prior study. Exit foraminal narrowing greatest at C5-6 on the right due to bony hypertrophy. No fracture or spondylolisthesis.  Probable multifocal pneumonia right upper lobe. Please see chest radiograph report for recommendation for noncontrast enhanced chest CT to further evaluate nodular lesion more inferiorly.  Bilateral  carotid artery calcification.   Electronically Signed   By: Lowella Grip III M.D.   On: 12/30/2014 08:13    EKG: Independently reviewed. NSR  Assessment/Plan Active Problems:   CAP (community acquired pneumonia)  ---  Acute hypoxic respiratory failure due to probable aspiration pneumonia -  Urine legionella -  Urine S. pneumo -  Sputum culture if able -  F/U Blood cultures  -  Change to clindamycin (due to PCN allergy) -  Start florastor  Dysphagia with aspiration -  Dysphagia 1 diet with nectar thick pending speech evaluation -  Speech therapy assessment -  Aspiration precautions -  No evidence of recurrent stricture on CT scan  Pulmonary nodules and mediastinal lymphadenopathy -  Quantiferon gold -  HIV  -  LDH and uric acid -  CT angio chest  -  Duplex extremities -  Pulmonology consultation -  Will a/c if signs of pulmonary embolism or DVT on tests above  Diabetes mellitus type 2, in remission/resolved. 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  Continue sodium chloride  Diet:  Dysphagia 1 with nectar Access:  PIV IVF:  Off Proph:  Lovenox  Code Status: Full code Family Communication: Patient and her daughter. Her daughter was just told that her son has cancer the evening prior to this admission. She states that her goal is to have a happy Christmas. Disposition Plan: Admit to MedSurg  Time spent: 60 min Makenzee Choudhry [facility] Pager [phone]  If 7PM-7AM, please contact night-coverage [email or web address] Password [email or web address] 12/30/2014, 11:53 AM

## 2014-12-30 NOTE — Consult Note (Signed)
Name: Jill Copeland MRN: [id number] DOB: Jun 15, 1931    ADMISSION DATE:  12/30/2014 CONSULTATION DATE:  10/10  REFERRING MD :  Dr. Jearld Adjutant  CHIEF COMPLAINT:  Abnormal CT scan  BRIEF PATIENT DESCRIPTION:   SIGNIFICANT EVENTS  81 year old female admitted on 10/10 with a working diagnosis of aspiration pneumonia, weakness, and fall. CT of chest was obtained demonstrating findings worrisome for cancer. Pulmonary was asked to evaluate and assist with further diagnostic efforts.  STUDIES:  10/10 CT scan: marked mediastinal hilar and infrahilar adenopathy. Scattered bilateral pulmonary nodules notably on the right peripheral,  large hiatal hernia and probable basilar pneumonia. "dense central opacity, and especially along the periphery of the lung, to include a 2.5 by 2.3 cm nodular opacity in the apical segment right upper lobe ; a 1.9 by 1.4 cm opacity posteriorly in the right upper lobe (image 17, series 6). ; peripheral wedge-shaped opacities in the right middle lobe and right lower lobe".    HISTORY OF PRESENT ILLNESS:   This is a 81 year old female patient with a significant history of dementia, paranoid schizophrenia, diabetes, hiatal hernia requiring several unsuccessful corrective surgeries, severe dysphagia with recurrent aspiration, esophageal strictures status post multiple dilations, GERD, and prior syncope. She resides at a skilled nursing facility. Over the last 2 months on a good day she ambulates with a walker, recently hospitalized for a fall, since that point she's had limited mobility. Presented to the emergency room on10/10 after a fall at the nursing home.  Upon further discussing with the patient's daughter the patient's had worsening cough, wheezing, some labored respirations, raising concern for recurrent aspiration which she's had no past. Because of her fall, and history of pneumonia the daughter insisted the patient be seen in the emergency room. Upon arrival she was  noted to have room air saturations of 86%. A CT of head was obtained and this was negative for acute abnormality. Chest x-ray suggested multifocal pneumonia, however a CT of the chest was obtained to further evaluate this. She was admitted to the hospital with a working diagnosis of aspiration pneumonia and associated acute respiratory failure, placed on Antibiotics.  A CT of the chest was obtained this demonstrated diffuse hilar and infrahilar adenopathy, as well as multiple pulmonary nodules specifically along the right upper lobe right middle lobe and right lower lobe raising concern for cancer. Pulmonary was asked to evaluate given the CT findings. PAST MEDICAL HISTORY :   has a past medical history of Diabetes mellitus without complication ([place]); Alzheimer disease; Schizo affective schizophrenia ([place]); Recurrent pneumonia; Dysphagia causing pulmonary aspiration with swallowing; Pneumococcal pneumonia ([place]); Bacteremia; Recurrent UTI; Stroke (cerebrum) ([place]); Arrhythmia; GERD (gastroesophageal reflux disease); Esophageal stricture; Hiatal hernia; Arthritis; and Hypothyroidism.  has past surgical history that includes Hernia repair; Pacemaker insertion; and Jejunostomy feeding tube. Prior to Admission medications   Medication Sig Start Date End Date Taking? Authorizing Provider  acetaminophen (TYLENOL) 500 MG tablet Take 500 mg by mouth 3 (three) times daily.   Yes Historical Provider, MD  benztropine (COGENTIN) 1 MG tablet Take 1 mg by mouth 2 (two) times daily.   Yes Historical Provider, MD  chlordiazePOXIDE (LIBRIUM) 25 MG capsule Take 25 mg by mouth every evening. At 7pm   Yes Historical Provider, MD  docusate sodium (COLACE) 100 MG capsule Take 100 mg by mouth 2 (two) times daily.   Yes Historical Provider, MD  levothyroxine (SYNTHROID, LEVOTHROID) 75 MCG tablet Take 75 mcg by mouth daily before breakfast.   Yes  Historical Provider, MD  OLANZapine (ZYPREXA) 20 MG tablet Take 20 mg by mouth at  bedtime.   Yes Historical Provider, MD  pantoprazole (PROTONIX) 40 MG tablet Take 40 mg by mouth daily.   Yes Historical Provider, MD  rivastigmine (EXELON) 4.6 mg/24hr Place 4.6 mg onto the skin daily.   Yes Historical Provider, MD  sodium chloride 1 G tablet Take 1 g by mouth 3 (three) times daily.   Yes Historical Provider, MD  traMADol (ULTRAM) 50 MG tablet Take 50 mg by mouth 2 (two) times daily as needed for moderate pain.   Yes Historical Provider, MD  traZODone (DESYREL) 50 MG tablet Take 50 mg by mouth every 8 (eight) hours as needed. For agitation   Yes Historical Provider, MD  acetaminophen (TYLENOL) 325 MG tablet Take 650 mg by mouth every 4 (four) hours as needed for mild pain.    Historical Provider, MD   Allergies  Allergen Reactions  . Penicillins     Unknown reaction - noted on MAR. Has patient had a PCN reaction causing immediate rash, facial/tongue/throat swelling, SOB or lightheadedness with hypotension: No Has patient had a PCN reaction causing severe rash involving mucus membranes or skin necrosis: No Has patient had a PCN reaction that required hospitalization No Has patient had a PCN reaction occurring within the last 10 years: No If all of the above answers are "NO", then may p    FAMILY HISTORY:  family history includes Dementia in her mother; Schizophrenia in her maternal aunt; Thyroid cancer in her daughter and grandchild; Thyroid nodules in her daughter. SOCIAL HISTORY:  reports that she has never smoked. She does not have any smokeless tobacco history on file. She reports that she does not drink alcohol or use illicit drugs.  REVIEW OF SYSTEMS:   Constitutional:no fever, no chills, no stated weight loss, has had increased weakness particularly over the last several weeks. HENT: Negative for hearing loss, ear pain, nosebleeds, congestion, sore throat, neck pain, tinnitus and ear discharge. Marked upper airway rhonchi  Eyes: Negative for blurred vision, double  vision, photophobia, pain, discharge and redness.  Respiratory: Mark chest congestion, marked rhonchi, cough nonproductive as patient unable to expectorate. Some increased work of breathing. Denies wheezing. She is unable to give much review of systems based on her dementia. Cardiovascular: Negative for chest pain, palpitations, orthopnea, claudication, leg swelling and PND.  Gastrointestinal: Negative for heartburn, nausea, vomiting, abdominal pain, diarrhea, constipation, blood in stool and melena.  Genitourinary: history of urine frequency Musculoskeletal: has fallen at the nursing home Skin: Negative for itching and rash.  Neurological: confused Endo/Heme/Allergies: Negative for environmental allergies and polydipsia. Does not bruise/bleed easily.  SUBJECTIVE:  Resting comfortably VITAL SIGNS: Temp:  [97.6 F (36.4 C)-98 F (36.7 C)] 97.6 F (36.4 C) (10/10 1013) Pulse Rate:  [66-75] 73 (10/10 1013) Resp:  [17-18] 18 (10/10 1013) BP: (112-131)/(58-65) 128/62 mmHg (10/10 1013) SpO2:  [86 %-97 %] 97 % (10/10 1013) Weight:  [53.8 kg (118 lb 9.7 oz)] 53.8 kg (118 lb 9.7 oz) (10/10 1013)  PHYSICAL EXAMINATION: General:  81 year old female resting comfortably in the bed no acute distress Neuro:  Awakens to voice, speech is slurred, she is confused, however moves all extremities she has no focal motor deficits HEENT: normocephalic, no JVD marked upper airway rhonchi Cardiovascular:  Regular rate and rhythm Lungs:  Even, nonlabored, no accessory muscle use. Scattered rhonchithat do not clear with cough Abdomen:  Soft nontender no organomegaly positive bowel sounds Musculoskeletal:  intact Skin:  Scattered areas of ecchymosis following her fall   Recent Labs Lab 12/30/14 0728  NA 136  K 4.0  CL 101  CO2 27  BUN 10  CREATININE 0.78  GLUCOSE 107*    Recent Labs Lab 12/30/14 0728  HGB 11.2*  HCT 36.1  WBC 12.6*  PLT 227   Dg Chest 1 View  12/30/2014   CLINICAL DATA:   Pain following fall.  Cough and congestion.  EXAM: CHEST 1 VIEW  COMPARISON:  None  FINDINGS: There is moderate interstitial edema with small pleural effusions bilaterally and borderline cardiomegaly. There is mild pulmonary venous hypertension.  In the periphery the right mid lung, there is a 2 x 2 cm nodular opacity. Pacemaker leads are attached to the right atrium right ventricle. No pneumothorax. No fractures are apparent.  IMPRESSION: 2 x 2 cm nodular opacity in the periphery the right midlung. Advise noncontrast enhanced chest CT to further assess. Underlying congestive heart failure. No pneumothorax.   Electronically Signed   By: Lowella Grip III M.D.   On: 12/30/2014 07:39   Ct Head Wo Contrast  12/30/2014   CLINICAL DATA:  Pain following fall  EXAM: CT HEAD WITHOUT CONTRAST  CT CERVICAL SPINE WITHOUT CONTRAST  TECHNIQUE: Multidetector CT imaging of the head and cervical spine was performed following the standard protocol without intravenous contrast. Multiplanar CT image reconstructions of the cervical spine were also generated.  COMPARISON:  CT head and CT cervical spine November 21, 2014 ; chest CT December 30, 2014  FINDINGS: CT HEAD FINDINGS  Mild diffuse atrophy is stable. There is no intracranial mass, hemorrhage, extra-axial fluid collection, or midline shift. There is patchy small vessel disease in the centra semiovale bilaterally. Elsewhere gray-white compartments are normal. No acute infarct apparent. Bony calvarium appears intact. The mastoid air cells are clear.  CT CERVICAL SPINE FINDINGS  There is no demonstrable fracture or spondylolisthesis. Prevertebral soft tissues and predental space regions are normal. There is facet hypertrophy at most levels bilaterally with exit foraminal narrowing due to bony hypertrophy at multiple levels, stable. Exit foraminal narrowing due to bony hypertrophy is most severe at C5-6 on the right, stable. No frank disc extrusion or high-grade stenosis.   There is calcification in both carotid arteries.  There are areas of infiltrate in the right upper lobe in both the apical and posterior segmental regions.  IMPRESSION: CT head: Stable atrophy with patchy periventricular small vessel disease. No intracranial mass, hemorrhage, or extra-axial fluid collection. No new gray-white compartment lesion.  CT cervical spine: Multifocal osteoarthritic change, stable from recent prior study. Exit foraminal narrowing greatest at C5-6 on the right due to bony hypertrophy. No fracture or spondylolisthesis.  Probable multifocal pneumonia right upper lobe. Please see chest radiograph report for recommendation for noncontrast enhanced chest CT to further evaluate nodular lesion more inferiorly.  Bilateral carotid artery calcification.   Electronically Signed   By: Lowella Grip III M.D.   On: 12/30/2014 08:13   Ct Chest W Contrast  12/30/2014   CLINICAL DATA:  Dementia, increased confusion. Hypoxia. Cough. Lung nodule. Recent pneumonia on cervical spine CT.  EXAM: CT CHEST WITH CONTRAST  TECHNIQUE: Multidetector CT imaging of the chest was performed during intravenous contrast administration.  CONTRAST:  10m OMNIPAQUE IOHEXOL 300 MG/ML  SOLN  COMPARISON:  12/30/2014  FINDINGS: Mediastinum/Nodes: Bilateral supraclavicular adenopathy, index left-sided node 1 cm in short axis, image 8 series 2. Prevascular, paratracheal, bilateral hilar, and right infrahilar adenopathy, right lower  paratracheal lymph node short axis diameter 1.8 cm.  Atherosclerotic aortic arch.  Moderate-sized hiatal hernia eccentric to the left. Possible anastomotic staple line above this in the mid esophagus, correlate with operative history.  Pacer leads in place.  I do not see any large central pulmonary emboli, but today's exam was not performed as a CT angiogram and is not considered sensitive for more peripheral pulmonary emboli.  Lungs/Pleura: Scattered densities are present in both lungs. Some of  these are irregular in nodule, with marginal airspace opacities and dense central opacity, and especially along the periphery of the lung, to include a 2.5 by 2.3 cm nodular opacity in the apical segment right upper lobe ; a 1.9 by 1.4 cm opacity posteriorly in the right upper lobe (image 17, series 6). ; peripheral wedge-shaped opacities in the right middle lobe and right lower lobe ; and several smaller left lung peripheral airspace opacities. Scattered bandlike opacities in both lungs suggesting atelectasis.  Thinning of the left mainstem bronchus noted on image 22 series 6, with suspected plugging in the right lower lobe tracheobronchial tree on images 28-32 of series 6. Left lower lobe airway plugging on image 44 series 602.  Upper abdomen: Hepatic steatosis.  Musculoskeletal: Mild thoracic kyphosis. Mild thoracic spondylosis. Thinning of the left seventh rib posterolaterally.  IMPRESSION: 1. Considerable mediastinal, hilar, and infrahilar adenopathy could be reactive or malignant. 2. There scattered densities in both lungs, some of which are nodular, but with the defining characteristic being more peripheral and wedge-shaped. Certainly pulmonary emboli can cause hemorrhage/pulmonary infarcts in such a pattern, but I do not see any large central pulmonary emboli (exam not sensitive for smaller pulmonary emboli-not performed as a pulmonary angiogram). Other possibilities include multilobar pneumonia, inflammatory nodules, or underlying malignancy. Follow-up to clearance is recommended. 3. Airway plugging in the right lower lobe and left lower lobe. 4. Moderate-sized hiatal hernia eccentric to the left. 5. Hepatic steatosis.   Electronically Signed   By: Van Clines M.D.   On: 12/30/2014 10:03   Ct Cervical Spine Wo Contrast  12/30/2014   CLINICAL DATA:  Pain following fall  EXAM: CT HEAD WITHOUT CONTRAST  CT CERVICAL SPINE WITHOUT CONTRAST  TECHNIQUE: Multidetector CT imaging of the head and cervical  spine was performed following the standard protocol without intravenous contrast. Multiplanar CT image reconstructions of the cervical spine were also generated.  COMPARISON:  CT head and CT cervical spine November 21, 2014 ; chest CT December 30, 2014  FINDINGS: CT HEAD FINDINGS  Mild diffuse atrophy is stable. There is no intracranial mass, hemorrhage, extra-axial fluid collection, or midline shift. There is patchy small vessel disease in the centra semiovale bilaterally. Elsewhere gray-white compartments are normal. No acute infarct apparent. Bony calvarium appears intact. The mastoid air cells are clear.  CT CERVICAL SPINE FINDINGS  There is no demonstrable fracture or spondylolisthesis. Prevertebral soft tissues and predental space regions are normal. There is facet hypertrophy at most levels bilaterally with exit foraminal narrowing due to bony hypertrophy at multiple levels, stable. Exit foraminal narrowing due to bony hypertrophy is most severe at C5-6 on the right, stable. No frank disc extrusion or high-grade stenosis.  There is calcification in both carotid arteries.  There are areas of infiltrate in the right upper lobe in both the apical and posterior segmental regions.  IMPRESSION: CT head: Stable atrophy with patchy periventricular small vessel disease. No intracranial mass, hemorrhage, or extra-axial fluid collection. No new gray-white compartment lesion.  CT cervical spine:  Multifocal osteoarthritic change, stable from recent prior study. Exit foraminal narrowing greatest at C5-6 on the right due to bony hypertrophy. No fracture or spondylolisthesis.  Probable multifocal pneumonia right upper lobe. Please see chest radiograph report for recommendation for noncontrast enhanced chest CT to further evaluate nodular lesion more inferiorly.  Bilateral carotid artery calcification.   Electronically Signed   By: Lowella Grip III M.D.   On: 12/30/2014 08:13    ASSESSMENT / PLAN:  Acute hypoxic  respiratory failure in the setting of what likely aspiration pneumonia Plan/recommendations -Continue current antibiotics  -Pulmonary hygiene -Wean oxygen as indicated -Strongly suggests repeat speech language pathology evaluation  Multiple pulmonary nodules with hilar and infrahilar adenopathy: Highly concerning for a cancer. However the patient's functional status is marginal at best. She is not ambulatory, she is demented, and would in no way tolerate any cancer related therapies. Had a long discussion with her daughter  Lattie Haw who is her power of attorney. She does not want to put her mother through invasive procedures. Nor does she want to put her through chemotherapy even if it were to be offered. At this point from a cancer standpoint we would not recommend invasive evaluation given the fact that the patient is not a candidate for therapy. The daughter is in agreement with this.  Plan/recommendation -Full DO NOT RESUSCITATE, I have discussed this with Lattie Haw and she is in agreement. She initially wanted to do "everything", however given these findings, and given her mother's deterioration she does not want to put her through any interventions that would be uncomfortable, or prolong suffering. -She would like [place] to assist her in organizing home hospice, her hope is to bring her mother home to [place] with hospice support   All other issues: Diabetes, dementia, schizoaffective disorder, dysphagia, overactive bladder,hyponatremia, and failure to thrive, will defer to primary service. At this point we have nothing further to offer. See further recommendations per attending comments below. Thank you for allowing [place] to assist with Mrs. Janvrin care.  Erick Colace ACNP-BC [place] Pager # [phone] OR # [phone] if no answer 12/30/2014, 4:15 PM   Attending Note:  I have examined patient, reviewed labs, studies and notes. I have discussed the case with Jerrye Bushy, and  I agree with the data and plans as amended above. 81 yo woman with MMP admitted from SNF after a fall. Found to have multiple pulmonary nodules very suspicious for malignancy. On evaluation, review of her chart and in discussion with her daughter Lattie Haw it is clear that she would tolerate treatment for malignancy or invasive diagnostics poorly. I fully agree with deferring any invasive diagnostics and transitioning her to Hospice care. Their goal is for her to go home with [facility] in [facility]. We will indicate in the orders that she will be DNR. I will not ask for any type of biopsy at this time.  Independent critical care time is 55 minutes.   Baltazar Apo, MD, PhD 12/30/2014, 4:54 PM [facility] [phone] or if no answer [phone]

## 2014-12-30 NOTE — ED Notes (Signed)
Patient is from [facility] and transported by [employer]. Pt fell from a sitting position while sitting in a chair. EMS reports pt has a skin tear to her left shin, edema to her left ankle, complains of left knee/ankle pain, red forehead/ nose. No LOC. Pt was sitting in the chair while a caregiver was making patients bed.

## 2014-12-30 NOTE — ED Provider Notes (Signed)
CSN: [id number]     Arrival date & time 12/30/14  0630 History   First MD Initiated Contact with Patient 12/30/14 [phone]     Chief Complaint  Patient presents with  . Fall     (Consider location/radiation/quality/duration/timing/severity/associated sxs/prior Treatment) Patient is a 81 y.o. female presenting with fall. The history is provided by the patient and the EMS personnel.  Fall This is a new problem.   patient presented for a fall. She is caregivers reportedly changing her bed when she slid off it and landed on the floor. Skin tear to her left lower leg and abrasion to her forehead. Patient is a baseline dementia and apparently more confused. Patient cannot participate in the history. She is hypoxic and has a cough.  Past Medical History  Diagnosis Date  . Diabetes mellitus without complication ([place])   . Alzheimer disease   . Schizo affective schizophrenia [facility])    Past Surgical History  Procedure Laterality Date  . Hernia repair    . Pacemaker insertion     History reviewed. No pertinent family history. Social History  Substance Use Topics  . Smoking status: Former Research scientist (life sciences)  . Smokeless tobacco: None  . Alcohol Use: No   OB History    No data available     Review of Systems  Unable to perform ROS     Allergies  Penicillins  Home Medications   Prior to Admission medications   Medication Sig Start Date End Date Taking? Authorizing Provider  acetaminophen (TYLENOL) 500 MG tablet Take 500 mg by mouth 3 (three) times daily.   Yes Historical Provider, MD  benztropine (COGENTIN) 1 MG tablet Take 1 mg by mouth 2 (two) times daily.   Yes Historical Provider, MD  chlordiazePOXIDE (LIBRIUM) 25 MG capsule Take 25 mg by mouth every evening. At 7pm   Yes Historical Provider, MD  docusate sodium (COLACE) 100 MG capsule Take 100 mg by mouth 2 (two) times daily.   Yes Historical Provider, MD  levothyroxine (SYNTHROID, LEVOTHROID) 75 MCG tablet Take 75 mcg by mouth daily  before breakfast.   Yes Historical Provider, MD  OLANZapine (ZYPREXA) 20 MG tablet Take 20 mg by mouth at bedtime.   Yes Historical Provider, MD  pantoprazole (PROTONIX) 40 MG tablet Take 40 mg by mouth daily.   Yes Historical Provider, MD  rivastigmine (EXELON) 4.6 mg/24hr Place 4.6 mg onto the skin daily.   Yes Historical Provider, MD  sodium chloride 1 G tablet Take 1 g by mouth 3 (three) times daily.   Yes Historical Provider, MD  traMADol (ULTRAM) 50 MG tablet Take 50 mg by mouth 2 (two) times daily as needed for moderate pain.   Yes Historical Provider, MD  traZODone (DESYREL) 50 MG tablet Take 50 mg by mouth every 8 (eight) hours as needed. For agitation   Yes Historical Provider, MD  acetaminophen (TYLENOL) 325 MG tablet Take 650 mg by mouth every 4 (four) hours as needed for mild pain.    Historical Provider, MD  prednisoLONE (PRELONE) 15 MG/5ML SOLN Take 15 mLs (45 mg total) by mouth daily before breakfast. Patient not taking: Reported on 12/30/2014 11/21/14   Charlann Lange, PA-C   BP 112/65 mmHg  Pulse 75  Temp(Src) 98 F (36.7 C) (Oral)  Resp 17  SpO2 90% Physical Exam  Constitutional: She appears well-developed.  HENT:  No step-off deformity. Pupils reactive. Mucous membranes dry. Face nontender.  Neck: Neck supple.  No midline tenderness  Cardiovascular: Normal rate.  Pulmonary/Chest:  Diffuse harsh breath sounds. Some wheezes and some rales. Pacemaker to left anterior chest wall  Abdominal: There is no tenderness.  Musculoskeletal: She exhibits no edema.  Neurological:  Patient is minimally verbal. Will squeeze hands bilaterally to commands.  Skin:  Skin tear to left anterior lower leg without underlying bony tenderness.    ED Course  Procedures (including critical care time) Labs Review Labs Reviewed  COMPREHENSIVE METABOLIC PANEL - Abnormal; Notable for the following:    Glucose, Bld 107 (*)    Calcium 8.7 (*)    Albumin 2.8 (*)    ALT 10 (*)    Alkaline  Phosphatase 207 (*)    All other components within normal limits  CBC WITH DIFFERENTIAL/PLATELET - Abnormal; Notable for the following:    WBC 12.6 (*)    Hemoglobin 11.2 (*)    MCH 24.8 (*)    RDW 15.9 (*)    Neutro Abs 10.1 (*)    Monocytes Absolute 1.1 (*)    All other components within normal limits  URINALYSIS, ROUTINE W REFLEX MICROSCOPIC (NOT AT [facility])  TROPONIN I  BRAIN NATRIURETIC PEPTIDE    Imaging Review Dg Chest 1 View  12/30/2014   CLINICAL DATA:  Pain following fall.  Cough and congestion.  EXAM: CHEST 1 VIEW  COMPARISON:  None  FINDINGS: There is moderate interstitial edema with small pleural effusions bilaterally and borderline cardiomegaly. There is mild pulmonary venous hypertension.  In the periphery the right mid lung, there is a 2 x 2 cm nodular opacity. Pacemaker leads are attached to the right atrium right ventricle. No pneumothorax. No fractures are apparent.  IMPRESSION: 2 x 2 cm nodular opacity in the periphery the right midlung. Advise noncontrast enhanced chest CT to further assess. Underlying congestive heart failure. No pneumothorax.   Electronically Signed   By: Lowella Grip III M.D.   On: 12/30/2014 07:39   Ct Head Wo Contrast  12/30/2014   CLINICAL DATA:  Pain following fall  EXAM: CT HEAD WITHOUT CONTRAST  CT CERVICAL SPINE WITHOUT CONTRAST  TECHNIQUE: Multidetector CT imaging of the head and cervical spine was performed following the standard protocol without intravenous contrast. Multiplanar CT image reconstructions of the cervical spine were also generated.  COMPARISON:  CT head and CT cervical spine November 21, 2014 ; chest CT December 30, 2014  FINDINGS: CT HEAD FINDINGS  Mild diffuse atrophy is stable. There is no intracranial mass, hemorrhage, extra-axial fluid collection, or midline shift. There is patchy small vessel disease in the centra semiovale bilaterally. Elsewhere gray-white compartments are normal. No acute infarct apparent. Bony calvarium  appears intact. The mastoid air cells are clear.  CT CERVICAL SPINE FINDINGS  There is no demonstrable fracture or spondylolisthesis. Prevertebral soft tissues and predental space regions are normal. There is facet hypertrophy at most levels bilaterally with exit foraminal narrowing due to bony hypertrophy at multiple levels, stable. Exit foraminal narrowing due to bony hypertrophy is most severe at C5-6 on the right, stable. No frank disc extrusion or high-grade stenosis.  There is calcification in both carotid arteries.  There are areas of infiltrate in the right upper lobe in both the apical and posterior segmental regions.  IMPRESSION: CT head: Stable atrophy with patchy periventricular small vessel disease. No intracranial mass, hemorrhage, or extra-axial fluid collection. No new gray-white compartment lesion.  CT cervical spine: Multifocal osteoarthritic change, stable from recent prior study. Exit foraminal narrowing greatest at C5-6 on the right due to bony hypertrophy.  No fracture or spondylolisthesis.  Probable multifocal pneumonia right upper lobe. Please see chest radiograph report for recommendation for noncontrast enhanced chest CT to further evaluate nodular lesion more inferiorly.  Bilateral carotid artery calcification.   Electronically Signed   By: Lowella Grip III M.D.   On: 12/30/2014 08:13   Ct Cervical Spine Wo Contrast  12/30/2014   CLINICAL DATA:  Pain following fall  EXAM: CT HEAD WITHOUT CONTRAST  CT CERVICAL SPINE WITHOUT CONTRAST  TECHNIQUE: Multidetector CT imaging of the head and cervical spine was performed following the standard protocol without intravenous contrast. Multiplanar CT image reconstructions of the cervical spine were also generated.  COMPARISON:  CT head and CT cervical spine November 21, 2014 ; chest CT December 30, 2014  FINDINGS: CT HEAD FINDINGS  Mild diffuse atrophy is stable. There is no intracranial mass, hemorrhage, extra-axial fluid collection, or midline  shift. There is patchy small vessel disease in the centra semiovale bilaterally. Elsewhere gray-white compartments are normal. No acute infarct apparent. Bony calvarium appears intact. The mastoid air cells are clear.  CT CERVICAL SPINE FINDINGS  There is no demonstrable fracture or spondylolisthesis. Prevertebral soft tissues and predental space regions are normal. There is facet hypertrophy at most levels bilaterally with exit foraminal narrowing due to bony hypertrophy at multiple levels, stable. Exit foraminal narrowing due to bony hypertrophy is most severe at C5-6 on the right, stable. No frank disc extrusion or high-grade stenosis.  There is calcification in both carotid arteries.  There are areas of infiltrate in the right upper lobe in both the apical and posterior segmental regions.  IMPRESSION: CT head: Stable atrophy with patchy periventricular small vessel disease. No intracranial mass, hemorrhage, or extra-axial fluid collection. No new gray-white compartment lesion.  CT cervical spine: Multifocal osteoarthritic change, stable from recent prior study. Exit foraminal narrowing greatest at C5-6 on the right due to bony hypertrophy. No fracture or spondylolisthesis.  Probable multifocal pneumonia right upper lobe. Please see chest radiograph report for recommendation for noncontrast enhanced chest CT to further evaluate nodular lesion more inferiorly.  Bilateral carotid artery calcification.   Electronically Signed   By: Lowella Grip III M.D.   On: 12/30/2014 08:13   I have personally reviewed and evaluated these images and lab results as part of my medical decision-making.   EKG Interpretation   Date/Time:  Monday December 30 2014 07:05:56 EDT Ventricular Rate:  74 PR Interval:  148 QRS Duration: 68 QT Interval:  396 QTC Calculation: 439 R Axis:   11 Text Interpretation:  Sinus rhythm Confirmed by Alvino Chapel  MD, Ovid Curd  [id number]) on 12/30/2014 8:11:48 AM      MDM   Final diagnoses:   CAP (community acquired pneumonia)  Fall, initial encounter    Patient with fall. Abrasion and skin tear but no severe injury. Also found to have likely pneumonia. Is hypoxic on room air. Fluid bolus antibiotics given. Will admit to internal medicine. Also had nodule on chest x-ray, That will be evaluated. Admit to internal medicine.    Davonna Belling, MD 12/30/14 [phone]

## 2014-12-30 NOTE — Progress Notes (Signed)
*  PRELIMINARY RESULTS* Vascular Ultrasound Lower extremity venous duplex has been completed.  Preliminary findings: no evidence of DVT. Left baker's cyst noted.  Landry Mellow, RDMS, RVT  12/30/2014, 2:20 PM

## 2014-12-30 NOTE — ED Notes (Signed)
Chest x-ray being performed

## 2014-12-30 NOTE — ED Notes (Signed)
Bed: WA16 Expected date:  Expected time:  Means of arrival:  Comments: RES A 

## 2014-12-30 NOTE — ED Notes (Signed)
Pt will open eyes and follow commands of squeezing hands when asked to but bedside that she is resting with eyes closed.

## 2014-12-30 NOTE — ED Notes (Signed)
Attempted to call report , receiving RN unavailable,

## 2014-12-30 NOTE — ED Notes (Signed)
Pt's belongings: sliver tone cross necklace with 2 heart shaped clear stones on cross, two circle pendants (jewerly in a clean urine cup), blue shoes, brown pants, brown/white shirt, and brown/white jacket.

## 2014-12-30 NOTE — ED Notes (Signed)
Bed: RESA Expected date: 12/30/14 Expected time: 6:10 AM Means of arrival: Ambulance Comments: 81yo F  Facial redness, knee and ankle pain

## 2014-12-30 NOTE — Progress Notes (Signed)
ANTIBIOTIC CONSULT NOTE - INITIAL  Pharmacy Consult for levofloxacin Indication: CAP  Allergies  PCN  Patient Measurements:   Adjusted Body Weight:   Vital Signs: Temp: 98 F (36.7 C) (10/10 0635) Temp Source: Oral (10/10 0635) BP: 112/65 mmHg (10/10 0635) Pulse Rate: 75 (10/10 0635) Intake/Output from previous day:   Intake/Output from this shift: Total I/O In: -  Out: 200 [Urine:200]  Labs:  Recent Labs  12/30/14 0728  WBC 12.6*  HGB 11.2*  PLT 227  CREATININE 0.78   CrCl cannot be calculated (Unknown ideal weight.). No results for input(s): VANCOTROUGH, VANCOPEAK, VANCORANDOM, GENTTROUGH, GENTPEAK, GENTRANDOM, TOBRATROUGH, TOBRAPEAK, TOBRARND, AMIKACINPEAK, AMIKACINTROU, AMIKACIN in the last 72 hours.   Microbiology: No results found for this or any previous visit (from the past 720 hour(s)).  Medical History: Past Medical History  Diagnosis Date  . Diabetes mellitus without complication ([place])   . Alzheimer disease   . Schizo affective schizophrenia [facility])    Assessment: 57 YOF presents to ED following fall.  Patient more confused (worse than baseline).  Pharmacy asked to dose levofloxacin for CAP.  CT reveals possible RUL pneumonia.    10/10 >>levofloxoacin  >>  / blood: / urine:  / sputum:   Dose changes/levels:  Est C-G CrCl = 21m/min  Goal of Therapy:  Dose for indication and for patient-specific parameters  Plan:   Levofloxacin '750mg'$  IV q24h  Borderline dose - follow renal function for now  DDoreene Eland PharmD, BCPS.   Pager: [phone]/12/2014,8:27 AM

## 2014-12-31 DIAGNOSIS — N3281 Overactive bladder: Secondary | ICD-10-CM

## 2014-12-31 DIAGNOSIS — E119 Type 2 diabetes mellitus without complications: Secondary | ICD-10-CM

## 2014-12-31 LAB — CBC
HEMATOCRIT: 32.7 % — AB (ref 36.0–46.0)
Hemoglobin: 10.5 g/dL — ABNORMAL LOW (ref 12.0–15.0)
MCH: 25.3 pg — AB (ref 26.0–34.0)
MCHC: 32.1 g/dL (ref 30.0–36.0)
MCV: 78.8 fL (ref 78.0–100.0)
Platelets: UNDETERMINED 10*3/uL (ref 150–400)
RBC: 4.15 MIL/uL (ref 3.87–5.11)
RDW: 15.7 % — ABNORMAL HIGH (ref 11.5–15.5)
WBC: 7.2 10*3/uL (ref 4.0–10.5)

## 2014-12-31 LAB — BASIC METABOLIC PANEL
Anion gap: 6 (ref 5–15)
BUN: 11 mg/dL (ref 6–20)
CHLORIDE: 103 mmol/L (ref 101–111)
CO2: 27 mmol/L (ref 22–32)
CREATININE: 0.73 mg/dL (ref 0.44–1.00)
Calcium: 8.2 mg/dL — ABNORMAL LOW (ref 8.9–10.3)
GFR calc non Af Amer: >60 mL/min (ref >60)
Glucose, Bld: 108 mg/dL — ABNORMAL HIGH (ref 65–99)
POTASSIUM: 4.8 mmol/L (ref 3.5–5.1)
SODIUM: 136 mmol/L (ref 135–145)

## 2014-12-31 LAB — HEMOGLOBIN A1C
Hgb A1c MFr Bld: 6.9 % — ABNORMAL HIGH (ref 4.8–5.6)
Mean Plasma Glucose: 151 mg/dL

## 2014-12-31 MED ORDER — OXYBUTYNIN CHLORIDE 5 MG PO TABS
5.0000 mg | ORAL_TABLET | Freq: Three times a day (TID) | ORAL | Status: DC
  Administered 2014-12-31 – 2015-01-02 (×6): 5 mg via ORAL
  Filled 2014-12-31 (×6): qty 1

## 2014-12-31 NOTE — Evaluation (Signed)
SLP Cancellation Note  Patient Details Name: Jill Copeland MRN: [id number] DOB: 1932-02-21   Cancelled treatment:       Reason Eval/Treat Not Completed: Patient at procedure or test/unavailable (pt with hospice representative in room and ? daughter, RN reports pt tolerating po adequately, will follow up, thanks.)   Luanna Salk, [facility] SLP [phone]  12/31/2014, 2:50 PM

## 2014-12-31 NOTE — Care Management Note (Signed)
Case Management Note  Patient Details  Name: Jill Copeland MRN: [id number] Date of Birth: [birth year]  Subjective/Objective:         80 yo admitted with CAP.            Action/Plan: From [facility]  Expected Discharge Date:                  Expected Discharge Plan:  Home w Hospice Care  In-House Referral:     Discharge planning Services  CM Consult  Post Acute Care Choice:  Hospice Choice offered to:  Adult Children  DME Arranged:    DME Agency:     HH Arranged:  Disease Management [place] Agency:  [facility]  Status of Service:  In process, will continue to follow  Medicare Important Message Given:  Yes-third notification given Date Medicare IM Given:    Medicare IM give by:    Date Additional Medicare IM Given:    Additional Medicare Important Message give by:     If discussed at [place] of Stay Meetings, dates discussed:    Additional Comments: This CM met with pt and daughter at bedside to offer choice for Home Hospice services. Pt confused and sleeping off and on during interaction. Daughter Lattie Haw plans to take pt home with her to [place] at [place]. [facility] was chosen and daughter is requesting ambulance transport, and hospital bed at this time. [facility] referral center contacted and given referral. CM will continue to follow and assist as needed. Lynnell Catalan, RN 12/31/2014, 1:48 PM

## 2014-12-31 NOTE — Progress Notes (Addendum)
Please see addendum below:  Notified by [place] of family request for Hospice and [place] services at home after discharge. Chart and patient Information currently under review to confirm hospice eligibility.   Spoke with Lattie Haw, at bedside to initiate education related to hospice philosophy, services and team approach to care. Family verbalized understanding of the information provided. Per discussion plan is for discharge to home by [facility]AR possibly tomorrow.   Please send signed completed DNR form home with patient.  Patient will need prescriptions for discharge comfort medications.   DME needs discussed and family requested Package B and Package D and transport W/C for delivery to the home today.  [facility] equipment manager Jewel Ysidro Evert notified and will contact [place] to arrange delivery to the home.  The home address has been verified and is correct in the chart; Lattie Haw is family member to be contacted to arrange time of delivery.   [facility] Referral Center aware of the above.  Completed discharge summary will need to be faxed to [facility] at [phone] when final.  Please notify [facility] when patient is ready to leave unit at discharge-call [phone].   [facility] information and contact numbers have been given to Dorothy during visit.   Please call with any questions.  Freddi Starr RN, BSN  [facility] Liaison  [phone]   Addendum: Spoke with [facility] referral center and unfortunately, due to geographical location, we are unable to accept patient at this time.  Daughter stated she would be happy to use a hospice closer to her home in [place].  She also stated equipment had not been delivered and phone call made to [facility] equipment manager to update on status.  [facility] greatly appreciates the referral and apologies for any inconvenience.  Thank you.

## 2014-12-31 NOTE — Progress Notes (Signed)
CSW received referral that pt admitted from [place].   CSW reviewed chart and noted that care management has consult for home with hospice and MD note states that pt daughter is hopeful to take pt home with hospice services. RNCM aware.  RNCM notified CSW that RNCM spoke with pt daughter and pt daughter plans for pt to return to pt daughter home. Pt daughter from [facility], but pt daughter is staying in [place], [place] and that is where pt daughter plans for pt to go.   CSW spoke with pt daughter regarding communicating with [facility] in regard to plan for pt to discharge to pt daughter home. Pt daughter request CSW notify facility.   CSW contacted [facility] and notified facility that pt daughter does not wish for pt to return.  Per RNCM, pt will need ambulance transport to daughter's home.  CSW to continue to follow to assist with transport home when medically ready for discharge.  Alison Murray, MSW, [place] Work [phone]

## 2014-12-31 NOTE — Progress Notes (Addendum)
[facility] PROGRESS NOTE  Karem Farha JTT:[id number] DOB: [birth year] DOA: 12/30/2014 PCP: PROVIDER NOT IN SYSTEM  Brief Summary  The patient is a 81 y.o. year-old female with history of schizoaffective disorder requiring multiple visits to the emergency department, inpatient hospitalizations, Alzheimer's dementia, diabetes mellitus type 2 without complication, hiatal hernia status post several unsuccessful corrective surgeries, dysphagia with recurrent aspiration pneumonia and previously had a jejunostomy tube which has subsequently been removed, esophageal stricture status post multiple dilations, GERD, arrhythmia with associated syncope status post pacemaker insertion, stroke, recurrent pneumonia with bacteremia, hypothyroidism who presented with 2 week history of cough and fall. She is from SNF and has had progressive decline over the last couple of years.  In the emergency department, she was afebrile with stable vital signs except for hypoxia to 86% on room air. CT of the head demonstrated no acute abnormalities with stable atrophy, and arthritic changes.  Chest CT demonstrated probable metastatic cancer and aspiration pneumonia with airway plugging of the right and left lower lobes.  She was admitted and started on antibiotics.    Assessment/Plan  Acute hypoxic respiratory failure due to probable aspiration pneumonia.  She was started on clindamycin and aspiration precautions -  Continue clindamycin and florastor -  Minimize blood draws if possible -  BCx NGTD  Dysphagia with aspiration - Dysphagia 1 diet with nectar thick pending speech evaluation - Speech therapy assessment pending - Aspiration precautions - No evidence of recurrent stricture on CT scan  Pulmonary nodules and mediastinal lymphadenopathy highly concerning for metastatic cancer - Appreciate pulmonology recommendations -  D/c Quantiferon gold, HIV, and other tests -  Plan on home with hospice and DNR  - case manager is aware  Diabetes mellitus type 2, in remission/resolved. - Daily CBG and if > 140 consistently, start SSI - Check A1c  Dementia, stable, continue librium  Schizoaffective disorder, stable, continue olanzapine, rivastigmine, prn trazodone  Esophageal stricture, stable, continue PPI  Arrhythmia s/p PPM insertion, ECG NSR  Overactive bladder - Start oxybutynin - d/c foley catheter and perform voiding trial  Hyponatremia likely due to SIADH and on chronic salt tabs. Continue sodium chloride -  D/c labs  Diet: Dysphagia 1 with nectar Access: PIV IVF: Off Proph: Lovenox  Code Status:  DNR Family Communication: patient alone Disposition Plan:  Home with hospice services pending speech evaluation and equipment delivery.    Consultants:  Pulmonology  Procedures:  CT head, neck, chest  Duplex lower extremities:  Neg   Antibiotics:  Clindamycin 10/10 >  HPI/Subjective:  Continues to have cough productive of sputum but feels better today.  Denies nausea, vomiting, diarrhea.  Denies wheeze  Objective: Filed Vitals:   12/30/14 1013 12/30/14 1450 12/30/14 2157 12/31/14 0546  BP: 128/62 122/62 116/78 121/55  Pulse: 73 70 60 72  Temp: 97.6 F (36.4 C) 97.8 F (36.6 C) 98.8 F (37.1 C) 97.9 F (36.6 C)  TempSrc: Oral Oral Oral Oral  Resp: '18 18 14 14  '$ Weight: 53.8 kg (118 lb 9.7 oz)     SpO2: 97% 96% 99% 91%    Intake/Output Summary (Last 24 hours) at 12/31/14 1118 Last data filed at 12/31/14 0546  Gross per 24 hour  Intake    490 ml  Output   1000 ml  Net   -510 ml   Filed Weights   12/30/14 1013  Weight: 53.8 kg (118 lb 9.7 oz)   Body mass index is 19.15 kg/(m^2).  Exam:   General:  Adult  female, lying in bed, [place] in place, No acute distress  HEENT:  NCAT, MMM  Cardiovascular:  RRR, nl S1, S2 no mrg, 2+ pulses, warm extremities  Respiratory:  rhonchorous bilateral breath sounds, diminished at the left base with rales, no  wheeze, no increased WOB  Abdomen:   NABS, soft, NT/ND  MSK:   Normal tone and bulk, no LEE  Neuro:  Grossly intact  Data Reviewed: Basic Metabolic Panel:  Recent Labs Lab 12/30/14 0728 12/31/14 0545  NA 136 136  K 4.0 4.8  CL 101 103  CO2 27 27  GLUCOSE 107* 108*  BUN 10 11  CREATININE 0.78 0.73  CALCIUM 8.7* 8.2*   Liver Function Tests:  Recent Labs Lab 12/30/14 0728  AST 25  ALT 10*  ALKPHOS 207*  BILITOT 0.4  PROT 7.8  ALBUMIN 2.8*   No results for input(s): LIPASE, AMYLASE in the last 168 hours. No results for input(s): AMMONIA in the last 168 hours. CBC:  Recent Labs Lab 12/30/14 0728 12/31/14 0545  WBC 12.6* 7.2  NEUTROABS 10.1*  --   HGB 11.2* 10.5*  HCT 36.1 32.7*  MCV 80.0 78.8  PLT 227 PLATELET CLUMPS NOTED ON SMEAR, UNABLE TO ESTIMATE    No results found for this or any previous visit (from the past 240 hour(s)).   Studies: Dg Chest 1 View  12/30/2014   CLINICAL DATA:  Pain following fall.  Cough and congestion.  EXAM: CHEST 1 VIEW  COMPARISON:  None  FINDINGS: There is moderate interstitial edema with small pleural effusions bilaterally and borderline cardiomegaly. There is mild pulmonary venous hypertension.  In the periphery the right mid lung, there is a 2 x 2 cm nodular opacity. Pacemaker leads are attached to the right atrium right ventricle. No pneumothorax. No fractures are apparent.  IMPRESSION: 2 x 2 cm nodular opacity in the periphery the right midlung. Advise noncontrast enhanced chest CT to further assess. Underlying congestive heart failure. No pneumothorax.   Electronically Signed   By: Lowella Grip III M.D.   On: 12/30/2014 07:39   Ct Head Wo Contrast  12/30/2014   CLINICAL DATA:  Pain following fall  EXAM: CT HEAD WITHOUT CONTRAST  CT CERVICAL SPINE WITHOUT CONTRAST  TECHNIQUE: Multidetector CT imaging of the head and cervical spine was performed following the standard protocol without intravenous contrast. Multiplanar CT  image reconstructions of the cervical spine were also generated.  COMPARISON:  CT head and CT cervical spine November 21, 2014 ; chest CT December 30, 2014  FINDINGS: CT HEAD FINDINGS  Mild diffuse atrophy is stable. There is no intracranial mass, hemorrhage, extra-axial fluid collection, or midline shift. There is patchy small vessel disease in the centra semiovale bilaterally. Elsewhere gray-white compartments are normal. No acute infarct apparent. Bony calvarium appears intact. The mastoid air cells are clear.  CT CERVICAL SPINE FINDINGS  There is no demonstrable fracture or spondylolisthesis. Prevertebral soft tissues and predental space regions are normal. There is facet hypertrophy at most levels bilaterally with exit foraminal narrowing due to bony hypertrophy at multiple levels, stable. Exit foraminal narrowing due to bony hypertrophy is most severe at C5-6 on the right, stable. No frank disc extrusion or high-grade stenosis.  There is calcification in both carotid arteries.  There are areas of infiltrate in the right upper lobe in both the apical and posterior segmental regions.  IMPRESSION: CT head: Stable atrophy with patchy periventricular small vessel disease. No intracranial mass, hemorrhage, or extra-axial fluid collection. No  new gray-white compartment lesion.  CT cervical spine: Multifocal osteoarthritic change, stable from recent prior study. Exit foraminal narrowing greatest at C5-6 on the right due to bony hypertrophy. No fracture or spondylolisthesis.  Probable multifocal pneumonia right upper lobe. Please see chest radiograph report for recommendation for noncontrast enhanced chest CT to further evaluate nodular lesion more inferiorly.  Bilateral carotid artery calcification.   Electronically Signed   By: Lowella Grip III M.D.   On: 12/30/2014 08:13   Ct Chest W Contrast  12/30/2014   CLINICAL DATA:  Dementia, increased confusion. Hypoxia. Cough. Lung nodule. Recent pneumonia on  cervical spine CT.  EXAM: CT CHEST WITH CONTRAST  TECHNIQUE: Multidetector CT imaging of the chest was performed during intravenous contrast administration.  CONTRAST:  81m OMNIPAQUE IOHEXOL 300 MG/ML  SOLN  COMPARISON:  12/30/2014  FINDINGS: Mediastinum/Nodes: Bilateral supraclavicular adenopathy, index left-sided node 1 cm in Destanie Tibbetts axis, image 8 series 2. Prevascular, paratracheal, bilateral hilar, and right infrahilar adenopathy, right lower paratracheal lymph node Kandra Graven axis diameter 1.8 cm.  Atherosclerotic aortic arch.  Moderate-sized hiatal hernia eccentric to the left. Possible anastomotic staple line above this in the mid esophagus, correlate with operative history.  Pacer leads in place.  I do not see any large central pulmonary emboli, but today's exam was not performed as a CT angiogram and is not considered sensitive for more peripheral pulmonary emboli.  Lungs/Pleura: Scattered densities are present in both lungs. Some of these are irregular in nodule, with marginal airspace opacities and dense central opacity, and especially along the periphery of the lung, to include a 2.5 by 2.3 cm nodular opacity in the apical segment right upper lobe ; a 1.9 by 1.4 cm opacity posteriorly in the right upper lobe (image 17, series 6). ; peripheral wedge-shaped opacities in the right middle lobe and right lower lobe ; and several smaller left lung peripheral airspace opacities. Scattered bandlike opacities in both lungs suggesting atelectasis.  Thinning of the left mainstem bronchus noted on image 22 series 6, with suspected plugging in the right lower lobe tracheobronchial tree on images 28-32 of series 6. Left lower lobe airway plugging on image 44 series 602.  Upper abdomen: Hepatic steatosis.  Musculoskeletal: Mild thoracic kyphosis. Mild thoracic spondylosis. Thinning of the left seventh rib posterolaterally.  IMPRESSION: 1. Considerable mediastinal, hilar, and infrahilar adenopathy could be reactive or  malignant. 2. There scattered densities in both lungs, some of which are nodular, but with the defining characteristic being more peripheral and wedge-shaped. Certainly pulmonary emboli can cause hemorrhage/pulmonary infarcts in such a pattern, but I do not see any large central pulmonary emboli (exam not sensitive for smaller pulmonary emboli-not performed as a pulmonary angiogram). Other possibilities include multilobar pneumonia, inflammatory nodules, or underlying malignancy. Follow-up to clearance is recommended. 3. Airway plugging in the right lower lobe and left lower lobe. 4. Moderate-sized hiatal hernia eccentric to the left. 5. Hepatic steatosis.   Electronically Signed   By: WVan ClinesM.D.   On: 12/30/2014 10:03   Ct Cervical Spine Wo Contrast  12/30/2014   CLINICAL DATA:  Pain following fall  EXAM: CT HEAD WITHOUT CONTRAST  CT CERVICAL SPINE WITHOUT CONTRAST  TECHNIQUE: Multidetector CT imaging of the head and cervical spine was performed following the standard protocol without intravenous contrast. Multiplanar CT image reconstructions of the cervical spine were also generated.  COMPARISON:  CT head and CT cervical spine November 21, 2014 ; chest CT December 30, 2014  FINDINGS: CT HEAD FINDINGS  Mild diffuse atrophy is stable. There is no intracranial mass, hemorrhage, extra-axial fluid collection, or midline shift. There is patchy small vessel disease in the centra semiovale bilaterally. Elsewhere gray-white compartments are normal. No acute infarct apparent. Bony calvarium appears intact. The mastoid air cells are clear.  CT CERVICAL SPINE FINDINGS  There is no demonstrable fracture or spondylolisthesis. Prevertebral soft tissues and predental space regions are normal. There is facet hypertrophy at most levels bilaterally with exit foraminal narrowing due to bony hypertrophy at multiple levels, stable. Exit foraminal narrowing due to bony hypertrophy is most severe at C5-6 on the right,  stable. No frank disc extrusion or high-grade stenosis.  There is calcification in both carotid arteries.  There are areas of infiltrate in the right upper lobe in both the apical and posterior segmental regions.  IMPRESSION: CT head: Stable atrophy with patchy periventricular small vessel disease. No intracranial mass, hemorrhage, or extra-axial fluid collection. No new gray-white compartment lesion.  CT cervical spine: Multifocal osteoarthritic change, stable from recent prior study. Exit foraminal narrowing greatest at C5-6 on the right due to bony hypertrophy. No fracture or spondylolisthesis.  Probable multifocal pneumonia right upper lobe. Please see chest radiograph report for recommendation for noncontrast enhanced chest CT to further evaluate nodular lesion more inferiorly.  Bilateral carotid artery calcification.   Electronically Signed   By: Lowella Grip III M.D.   On: 12/30/2014 08:13    Scheduled Meds: . acetaminophen  500 mg Oral TID  . benztropine  1 mg Oral BID  . chlordiazePOXIDE  25 mg Oral Q24H  . clindamycin (CLEOCIN) IV  600 mg Intravenous 3 times per day  . docusate sodium  100 mg Oral BID  . enoxaparin (LOVENOX) injection  40 mg Subcutaneous Q24H  . guaiFENesin  600 mg Oral BID  . levothyroxine  75 mcg Oral QAC breakfast  . OLANZapine  20 mg Oral QHS  . oxybutynin  5 mg Oral TID  . pantoprazole  40 mg Oral Daily  . rivastigmine  4.6 mg Transdermal Daily  . saccharomyces boulardii  250 mg Oral BID  . sodium chloride  1 g Oral TID   Continuous Infusions:   Active Problems:   CAP (community acquired pneumonia)   Diabetes mellitus without complication ([place])   Alzheimer disease   Schizo affective schizophrenia ([place])   Recurrent pneumonia   Dysphagia causing pulmonary aspiration with swallowing   Arrhythmia   Esophageal stricture   Hypothyroidism   Overactive bladder   Lung nodule    Time spent: 30 min    Jacqui Headen, [facility] Pager  [phone]. If 7PM-7AM, please contact night-coverage at [email or web address], password [facility] 12/31/2014, 11:18 AM  LOS: 1 day

## 2015-01-01 ENCOUNTER — Telehealth: Payer: Self-pay | Admitting: *Deleted

## 2015-01-01 DIAGNOSIS — R1319 Other dysphagia: Secondary | ICD-10-CM

## 2015-01-01 DIAGNOSIS — J189 Pneumonia, unspecified organism: Secondary | ICD-10-CM

## 2015-01-01 LAB — CBC
HCT: 33.2 % — ABNORMAL LOW (ref 36.0–46.0)
Hemoglobin: 10.4 g/dL — ABNORMAL LOW (ref 12.0–15.0)
MCH: 25 pg — AB (ref 26.0–34.0)
MCHC: 31.3 g/dL (ref 30.0–36.0)
MCV: 79.8 fL (ref 78.0–100.0)
PLATELETS: 400 10*3/uL (ref 150–400)
RBC: 4.16 MIL/uL (ref 3.87–5.11)
RDW: 15.8 % — ABNORMAL HIGH (ref 11.5–15.5)
WBC: 5.4 10*3/uL (ref 4.0–10.5)

## 2015-01-01 MED ORDER — OXYBUTYNIN CHLORIDE 5 MG PO TABS: 5.0000 mg | ORAL_TABLET | Freq: Three times a day (TID) | ORAL | Status: DC

## 2015-01-01 MED ORDER — HYDROCOD POLST-CPM POLST ER 10-8 MG/5ML PO SUER: 2.5000 mL | Freq: Two times a day (BID) | ORAL | Status: DC | PRN

## 2015-01-01 MED ORDER — CLINDAMYCIN HCL 300 MG PO CAPS: 300.0000 mg | ORAL_CAPSULE | Freq: Three times a day (TID) | ORAL | Status: DC

## 2015-01-01 MED ORDER — CLINDAMYCIN HCL 300 MG PO CAPS
300.0000 mg | ORAL_CAPSULE | Freq: Three times a day (TID) | ORAL | Status: DC
  Administered 2015-01-02: 300 mg via ORAL
  Filled 2015-01-01 (×5): qty 1

## 2015-01-01 MED ORDER — SACCHAROMYCES BOULARDII 250 MG PO CAPS: 250.0000 mg | ORAL_CAPSULE | Freq: Two times a day (BID) | ORAL | Status: AC

## 2015-01-01 NOTE — Evaluation (Signed)
Clinical/Bedside Swallow Evaluation Patient Details  Name: Jill Copeland MRN: [id number] Date of Birth: [birth year]  Today's Date: 01/01/2015 Time: SLP Start Time (ACUTE ONLY): 4627 SLP Stop Time (ACUTE ONLY): 1626 SLP Time Calculation (min) (ACUTE ONLY): 48 min  Past Medical History:  Past Medical History  Diagnosis Date  . Diabetes mellitus without complication ([place])   . Alzheimer disease   . Schizo affective schizophrenia ([place])   . Recurrent pneumonia   . Dysphagia causing pulmonary aspiration with swallowing   . Pneumococcal pneumonia ([place])   . Bacteremia     several timesf  . Recurrent UTI   . Stroke (cerebrum) ([place])   . Arrhythmia     with syncope s/p PPM  . GERD (gastroesophageal reflux disease)   . Esophageal stricture   . Hiatal hernia     s/p repair with fistula  . Arthritis   . Hypothyroidism    Past Surgical History:  Past Surgical History  Procedure Laterality Date  . Hernia repair    . Pacemaker insertion    . Jejunostomy feeding tube     HPI:  pt is an 81 yo female adm to [facility] with diagnosis of CAP.  PMH + for schizoaffective disorder requiring multiple visits to the emergency department, inpatient hospitalizations, Alzheimer's dementia, diabetes mellitus type 2 without complication, hiatal hernia status post several unsuccessful corrective surgeries, dysphagia with recurrent aspiration pneumonia and previously had a jejunostomy tube which has subsequently been removed, esophageal stricture status post multiple dilations, GERD, arrhythmia with associated syncope status post pacemaker insertion, stroke, recurrent pneumonia with bacteremia, hypothyroidism who presented with 2 week history of cough and fall. She is from SNF and has had progressive decline over the last couple of years per MD note. In the emergency department, she was afebrile with stable vital signs except for hypoxia to 86% on room air. CT of the head demonstrated no acute abnormalities with  stable atrophy, and arthritic changes. Chest CT demonstrated probable metastatic cancer and aspiration pneumonia with airway plugging of the right and left lower lobes. She was admitted and started on antibiotics. Swallow evaluation ordered, pt has been placed on dys1/nectar diet.     Assessment / Plan / Recommendation Clinical Impression  Pt presents with cognitive based and suspect sensorimotor based oropharyngeal dysphagia.  She was noted to lean to the left and demonstrate decreased labial seal resulting in loss of liquids from left labia.  Delayed oral transiting noted across consistencies but no oral residuals with puree/thin/nectar.    Given pt lacks dentition and oral secretions retained without pt awareness prior to po trials, did not provide her with solids.   Overt cough noted with pt consuming sequential boluses of nectar thick juice - likely premature spillage into pharynx.  Small single boluses of nectar and thin tolerated well.    Frequent belching noted and SLP would recommend reflux precautions given extensive esophageal hx.  Pt tolerated pills with pudding *whole and 1/2'd* well without clinical indications of aspiration or residuals.    Pt self reports pleeasure with thinner liquids than thick and given plan is hospice, would recommend to advance to thin.  Also recommend  trials of soft solids if pt tolerates with dentures in place.  Left information in writing re: comfort feeding and aspiration precautions for pt/family.  No family present at this time to educate.    Thanks for this referral.  Please reorder if desire.      Aspiration Risk  Moderate    Diet  Recommendation Dysphagia 1 (Puree);Thin (use nectar liquids if pt coughing/uncomfortable with thin and nectar decr cough)   Medication Administration: Whole meds with puree (start and follow with liquids) Compensations: Slow rate;Small sips/bites;Other (Comment) (drink liquids t/o meal to aid oropharyngeal and esophageal  clearance)    Other  Recommendations Oral Care Recommendations: Oral care BID   Follow Up Recommendations    n/a   Frequency and Duration   n/a     Pertinent Vitals/Pain Afebrile, decreased - congested nonproductive cough      Swallow Study Prior Functional Status   see hhx    General Date of Onset: 01/01/15 Other Pertinent Information: pt is an 81 yo female adm to [facility] with diagnosis of CAP.  PMH + for schizoaffective disorder requiring multiple visits to the emergency department, inpatient hospitalizations, Alzheimer's dementia, diabetes mellitus type 2 without complication, hiatal hernia status post several unsuccessful corrective surgeries, dysphagia with recurrent aspiration pneumonia and previously had a jejunostomy tube which has subsequently been removed, esophageal stricture status post multiple dilations, GERD, arrhythmia with associated syncope status post pacemaker insertion, stroke, recurrent pneumonia with bacteremia, hypothyroidism who presented with 2 week history of cough and fall. She is from SNF and has had progressive decline over the last couple of years per MD note. In the emergency department, she was afebrile with stable vital signs except for hypoxia to 86% on room air. CT of the head demonstrated no acute abnormalities with stable atrophy, and arthritic changes. Chest CT demonstrated probable metastatic cancer and aspiration pneumonia with airway plugging of the right and left lower lobes. She was admitted and started on antibiotics. Swallow evaluation ordered, pt has been placed on dys1/nectar diet.   Type of Study: Bedside swallow evaluation Diet Prior to this Study: Dysphagia 1 (puree);Nectar-thick liquids Temperature Spikes Noted: No Respiratory Status: Supplemental O2 delivered via (comment) History of Recent Intubation: No Behavior/Cognition: Alert;Distractible;Doesn't follow directions Oral Cavity - Dentition: Edentulous (? if pt has dentures, none in  hospital room from SLP attempt to locate) Self-Feeding Abilities: Needs assist;Needs set up;Able to feed self Patient Positioning: Upright in bed Baseline Vocal Quality: Low vocal intensity;Hoarse Volitional Cough: Weak Volitional Swallow: Unable to elicit    Oral/Motor/Sensory Function Overall Oral Motor/Sensory Function: Other (comment) (pt did not follow commands due to her cognitive deficits, pt leaning left and had labial loss of liquids left)   Ice Chips Ice chips: Not tested   Thin Liquid Thin Liquid: Impaired Presentation: Cup;Self Fed;Spoon;Straw Oral Phase Impairments: Reduced labial seal Oral Phase Functional Implications: Prolonged oral transit;Oral holding;Left anterior spillage Pharyngeal  Phase Impairments: Suspected delayed Swallow    Nectar Thick Nectar Thick Liquid: Impaired Presentation: Self Fed;Cup;Spoon;Straw Oral Phase Impairments: Reduced labial seal Oral phase functional implications: Left anterior spillage;Prolonged oral transit Pharyngeal Phase Impairments: Suspected delayed Swallow;Cough - Delayed Other Comments: overt significant amount of coughing with nectar via cup with allowance to self administer at increased rate, improved tolerance of small single boluses   Honey Thick Honey Thick Liquid: Not tested   Puree Puree: Impaired Presentation: Spoon Oral Phase Impairments: Reduced labial seal;Impaired anterior to posterior transit Oral Phase Functional Implications: Prolonged oral transit Pharyngeal Phase Impairments: Suspected delayed Swallow   Solid   GO    Solid: Not tested Other Comments: did not test due to pt without dentition and amount of retained secretions orally prior to initiation of po trials        Luanna Salk, [facility] SLP [phone]

## 2015-01-01 NOTE — Discharge Summary (Signed)
Physician Discharge Summary  Jill Copeland UXN:[id number] DOB: [birth year] DOA: 12/30/2014  PCP: PROVIDER NOT IN SYSTEM  Admit date: 12/30/2014 Discharge date: 01/02/2015  Time spent: 25 minutes  Recommendations for Outpatient Follow-up:   Follow up with hospice MD as needed.    Discharge Diagnoses:  Active Problems:   CAP (community acquired pneumonia)   Diabetes mellitus without complication ([place])   Alzheimer disease   Schizo affective schizophrenia ([place])   Recurrent pneumonia   Dysphagia causing pulmonary aspiration with swallowing   Arrhythmia   Esophageal stricture   Hypothyroidism   Overactive bladder   Lung nodule   Discharge Condition: improved  Diet recommendation: Dysphagia 1 (Puree);Thin (use nectar liquids if pt coughing/uncomfortable with thin and nectar decr cough)    Filed Weights   12/30/14 1013  Weight: 53.8 kg (118 lb 9.7 oz)    History of present illness:  The patient is a 81 y.o. year-old female with history of schizoaffective disorder requiring multiple visits to the emergency department, inpatient hospitalizations, Alzheimer's dementia, diabetes mellitus type 2 without complication, hiatal hernia status post several unsuccessful corrective surgeries, dysphagia with recurrent aspiration pneumonia and previously had a jejunostomy tube which has subsequently been removed, esophageal stricture status post multiple dilations, GERD, arrhythmia with associated syncope status post pacemaker insertion, stroke, recurrent pneumonia with bacteremia, hypothyroidism who presented with 2 week history of cough and fall. She is from [place] and has had progressive decline over the last couple of years. In the emergency department, she was afebrile with stable vital signs except for hypoxia to 86% on room air. CT of the head demonstrated no acute abnormalities with stable atrophy, and arthritic changes. Chest CT demonstrated probable metastatic cancer and aspiration  pneumonia with airway plugging of the right and left lower lobes. She was admitted and started on antibiotics.Marland Kitchen   Hospital Course:  Acute hypoxic respiratory failure due to probable aspiration pneumonia. She was started on clindamycin and aspiration precautions - Continue clindamycin and florastor to complete the course.  - Minimize blood draws if possible - BCx NGTD  Dysphagia with aspiration - Dysphagia 1 diet with nectar thick , further recommendations as per speech. - Speech therapy assessment done.  - Aspiration precautions - No evidence of recurrent stricture on CT scan  Pulmonary nodules and mediastinal lymphadenopathy highly concerning for metastatic cancer - Appreciate pulmonology recommendations - D/c Quantiferon gold, HIV, and other tests - Plan on home with hospice and DNR - case manager is aware  Diabetes mellitus type 2,  hgab1c is 6.9.   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 Continue sodium chloride   Procedures:  none  Consultations:  Speech evaluation.   Discharge Exam: Filed Vitals:   01/01/15 1337  BP: 117/55  Pulse: 72  Temp: 97.2 F (36.2 C)  Resp: 16    General: alert afebrile comfortable Cardiovascular: s1s2 Respiratory: ctab  Discharge Instructions   Discharge Instructions    Discharge instructions    Complete by:  As directed   Follow up with hospice MD AS needed.          Current Discharge Medication List    START taking these medications   Details  chlorpheniramine-HYDROcodone (TUSSIONEX) 10-8 MG/5ML SUER Take 2.5 mLs by mouth every 12 (twelve) hours as needed for cough.  Qty: 140 mL, Refills: 0    clindamycin (CLEOCIN) 300 MG capsule Take 1  capsule (300 mg total) by mouth 3 (three) times daily. Qty: 15 capsule, Refills: 0    oxybutynin (DITROPAN) 5 MG tablet Take 1 tablet (5 mg total) by mouth 3 (three) times daily. Qty: 60 tablet, Refills: 0    saccharomyces boulardii (FLORASTOR) 250 MG capsule Take 1 capsule (250 mg total) by mouth 2 (two) times daily. Qty: 30 capsule, Refills: 0      CONTINUE these medications which have NOT CHANGED   Details  acetaminophen (TYLENOL) 500 MG tablet Take 500 mg by mouth 3 (three) times daily.    benztropine (COGENTIN) 1 MG tablet Take 1 mg by mouth 2 (two) times daily.    chlordiazePOXIDE (LIBRIUM) 25 MG capsule Take 25 mg by mouth every evening. At 7pm    docusate sodium (COLACE) 100 MG capsule Take 100 mg by mouth 2 (two) times daily.    levothyroxine (SYNTHROID, LEVOTHROID) 75 MCG tablet Take 75 mcg by mouth daily before breakfast.    OLANZapine (ZYPREXA) 20 MG tablet Take 20 mg by mouth at bedtime.    pantoprazole (PROTONIX) 40 MG tablet Take 40 mg by mouth daily.    rivastigmine (EXELON) 4.6 mg/24hr Place 4.6 mg onto the skin daily.    sodium chloride 1 G tablet Take 1 g by mouth 3 (three) times daily.    traMADol (ULTRAM) 50 MG tablet Take 50 mg by mouth 2 (two) times daily as needed for moderate pain.    traZODone (DESYREL) 50 MG tablet Take 50 mg by mouth every 8 (eight) hours as needed. For agitation       Allergies  Allergen Reactions  . Penicillins     Unknown reaction - noted on MAR. Has patient had a PCN reaction causing immediate rash, facial/tongue/throat swelling, SOB or lightheadedness with hypotension: } Has patient had a PCN reaction causing severe rash involving mucus membranes or skin necrosis: { Has patient had a PCN reaction that required hospitalization  Has patient had a PCN reaction occurring within the last 10 years: { If all of the above answers are "NO", then may p      The results of significant diagnostics from this hospitalization  (including imaging, microbiology, ancillary and laboratory) are listed below for reference.    Significant Diagnostic Studies: Dg Chest 1 View  12/30/2014  CLINICAL DATA:  Pain following fall.  Cough and congestion. EXAM: CHEST 1 VIEW COMPARISON:  None FINDINGS: There is moderate interstitial edema with small pleural effusions bilaterally and borderline cardiomegaly. There is mild pulmonary venous hypertension. In the periphery the right mid lung, there is a 2 x 2 cm nodular opacity. Pacemaker leads are attached to the right atrium right ventricle. No pneumothorax. No fractures are apparent. IMPRESSION: 2 x 2 cm nodular opacity in the periphery the right midlung. Advise noncontrast enhanced chest CT to further assess. Underlying congestive heart failure. No pneumothorax. Electronically Signed   By: Lowella Grip III M.D.   On: 12/30/2014 07:39   Ct Head Wo Contrast  12/30/2014  CLINICAL DATA:  Pain following fall EXAM: CT HEAD WITHOUT CONTRAST CT CERVICAL SPINE WITHOUT CONTRAST TECHNIQUE: Multidetector CT imaging of the head and cervical spine was performed following the standard protocol without intravenous contrast. Multiplanar CT image reconstructions of the cervical spine were also generated. COMPARISON:  CT head and CT cervical spine November 21, 2014 ; chest CT December 30, 2014 FINDINGS: CT HEAD FINDINGS Mild  diffuse atrophy is stable. There is no intracranial mass, hemorrhage, extra-axial fluid collection, or midline shift. There is patchy small vessel disease in the centra semiovale bilaterally. Elsewhere gray-white compartments are normal. No acute infarct apparent. Bony calvarium appears intact. The mastoid air cells are clear. CT CERVICAL SPINE FINDINGS There is no demonstrable fracture or spondylolisthesis. Prevertebral soft tissues and predental space regions are normal. There is facet hypertrophy at most levels bilaterally with exit foraminal narrowing due to bony hypertrophy at multiple  levels, stable. Exit foraminal narrowing due to bony hypertrophy is most severe at C5-6 on the right, stable. No frank disc extrusion or high-grade stenosis. There is calcification in both carotid arteries. There are areas of infiltrate in the right upper lobe in both the apical and posterior segmental regions. IMPRESSION: CT head: Stable atrophy with patchy periventricular small vessel disease. No intracranial mass, hemorrhage, or extra-axial fluid collection. No new gray-white compartment lesion. CT cervical spine: Multifocal osteoarthritic change, stable from recent prior study. Exit foraminal narrowing greatest at C5-6 on the right due to bony hypertrophy. No fracture or spondylolisthesis. Probable multifocal pneumonia right upper lobe. Please see chest radiograph report for recommendation for noncontrast enhanced chest CT to further evaluate nodular lesion more inferiorly. Bilateral carotid artery calcification. Electronically Signed   By: Lowella Grip III M.D.   On: 12/30/2014 08:13   Ct Chest W Contrast  12/30/2014  CLINICAL DATA:  Dementia, increased confusion. Hypoxia. Cough. Lung nodule. Recent pneumonia on cervical spine CT. EXAM: CT CHEST WITH CONTRAST TECHNIQUE: Multidetector CT imaging of the chest was performed during intravenous contrast administration. CONTRAST:  60m OMNIPAQUE IOHEXOL 300 MG/ML  SOLN COMPARISON:  12/30/2014 FINDINGS: Mediastinum/Nodes: Bilateral supraclavicular adenopathy, index left-sided node 1 cm in short axis, image 8 series 2. Prevascular, paratracheal, bilateral hilar, and right infrahilar adenopathy, right lower paratracheal lymph node short axis diameter 1.8 cm. Atherosclerotic aortic arch. Moderate-sized hiatal hernia eccentric to the left. Possible anastomotic staple line above this in the mid esophagus, correlate with operative history. Pacer leads in place. I do not see any large central pulmonary emboli, but today's exam was not performed as a CT angiogram and  is not considered sensitive for more peripheral pulmonary emboli. Lungs/Pleura: Scattered densities are present in both lungs. Some of these are irregular in nodule, with marginal airspace opacities and dense central opacity, and especially along the periphery of the lung, to include a 2.5 by 2.3 cm nodular opacity in the apical segment right upper lobe ; a 1.9 by 1.4 cm opacity posteriorly in the right upper lobe (image 17, series 6). ; peripheral wedge-shaped opacities in the right middle lobe and right lower lobe ; and several smaller left lung peripheral airspace opacities. Scattered bandlike opacities in both lungs suggesting atelectasis. Thinning of the left mainstem bronchus noted on image 22 series 6, with suspected plugging in the right lower lobe tracheobronchial tree on images 28-32 of series 6. Left lower lobe airway plugging on image 44 series 602. Upper abdomen: Hepatic steatosis. Musculoskeletal: Mild thoracic kyphosis. Mild thoracic spondylosis. Thinning of the left seventh rib posterolaterally. IMPRESSION: 1. Considerable mediastinal, hilar, and infrahilar adenopathy could be reactive or malignant. 2. There scattered densities in both lungs, some of which are nodular, but with the defining characteristic being more peripheral and wedge-shaped. Certainly pulmonary emboli can cause hemorrhage/pulmonary infarcts in such a pattern, but I do not see any large central pulmonary emboli (exam not sensitive for smaller pulmonary emboli-not performed as a pulmonary angiogram). Other possibilities include  multilobar pneumonia, inflammatory nodules, or underlying malignancy. Follow-up to clearance is recommended. 3. Airway plugging in the right lower lobe and left lower lobe. 4. Moderate-sized hiatal hernia eccentric to the left. 5. Hepatic steatosis. Electronically Signed   By: Van Clines M.D.   On: 12/30/2014 10:03   Ct Cervical Spine Wo Contrast  12/30/2014  CLINICAL DATA:  Pain following fall  EXAM: CT HEAD WITHOUT CONTRAST CT CERVICAL SPINE WITHOUT CONTRAST TECHNIQUE: Multidetector CT imaging of the head and cervical spine was performed following the standard protocol without intravenous contrast. Multiplanar CT image reconstructions of the cervical spine were also generated. COMPARISON:  CT head and CT cervical spine November 21, 2014 ; chest CT December 30, 2014 FINDINGS: CT HEAD FINDINGS Mild diffuse atrophy is stable. There is no intracranial mass, hemorrhage, extra-axial fluid collection, or midline shift. There is patchy small vessel disease in the centra semiovale bilaterally. Elsewhere gray-white compartments are normal. No acute infarct apparent. Bony calvarium appears intact. The mastoid air cells are clear. CT CERVICAL SPINE FINDINGS There is no demonstrable fracture or spondylolisthesis. Prevertebral soft tissues and predental space regions are normal. There is facet hypertrophy at most levels bilaterally with exit foraminal narrowing due to bony hypertrophy at multiple levels, stable. Exit foraminal narrowing due to bony hypertrophy is most severe at C5-6 on the right, stable. No frank disc extrusion or high-grade stenosis. There is calcification in both carotid arteries. There are areas of infiltrate in the right upper lobe in both the apical and posterior segmental regions. IMPRESSION: CT head: Stable atrophy with patchy periventricular small vessel disease. No intracranial mass, hemorrhage, or extra-axial fluid collection. No new gray-white compartment lesion. CT cervical spine: Multifocal osteoarthritic change, stable from recent prior study. Exit foraminal narrowing greatest at C5-6 on the right due to bony hypertrophy. No fracture or spondylolisthesis. Probable multifocal pneumonia right upper lobe. Please see chest radiograph report for recommendation for noncontrast enhanced chest CT to further evaluate nodular lesion more inferiorly. Bilateral carotid artery calcification.  Electronically Signed   By: Lowella Grip III M.D.   On: 12/30/2014 08:13    Microbiology: Recent Results (from the past 240 hour(s))  Culture, blood (routine x 2) Call MD if unable to obtain prior to antibiotics being given     Status: None (Preliminary result)   Collection Time: 12/30/14  1:05 PM  Result Value Ref Range Status   Specimen Description BLOOD LEFT HAND  Final   Special Requests IN PEDIATRIC BOTTLE 2CC  Final   Culture   Final    NO GROWTH 2 DAYS Performed at [facility]    Report Status PENDING  Incomplete  Culture, blood (routine x 2) Call MD if unable to obtain prior to antibiotics being given     Status: None (Preliminary result)   Collection Time: 12/30/14  2:05 PM  Result Value Ref Range Status   Specimen Description BLOOD RIGHT HAND  Final   Special Requests IN PEDIATRIC BOTTLE  [place]  Final   Culture   Final    NO GROWTH 2 DAYS Performed at [facility]    Report Status PENDING  Incomplete     Labs: Basic Metabolic Panel:  Recent Labs Lab 12/30/14 0728 12/31/14 0545  NA 136 136  K 4.0 4.8  CL 101 103  CO2 27 27  GLUCOSE 107* 108*  BUN 10 11  CREATININE 0.78 0.73  CALCIUM 8.7* 8.2*   Liver Function Tests:  Recent Labs Lab 12/30/14 0728  AST  25  ALT 10*  ALKPHOS 207*  BILITOT 0.4  PROT 7.8  ALBUMIN 2.8*   No results for input(s): LIPASE, AMYLASE in the last 168 hours. No results for input(s): AMMONIA in the last 168 hours. CBC:  Recent Labs Lab 12/30/14 0728 12/31/14 0545 01/01/15 1125  WBC 12.6* 7.2 5.4  NEUTROABS 10.1*  --   --   HGB 11.2* 10.5* 10.4*  HCT 36.1 32.7* 33.2*  MCV 80.0 78.8 79.8  PLT 227 PLATELET CLUMPS NOTED ON SMEAR, UNABLE TO ESTIMATE 400   Cardiac Enzymes:  Recent Labs Lab 12/30/14 0728  TROPONINI <0.03   BNP: BNP (last 3 results)  Recent Labs  12/30/14 0728  BNP 113.8*    ProBNP (last 3 results) No results for input(s): PROBNP in the last 8760 hours.  CBG: No results  for input(s): GLUCAP in the last 168 hours.     SignedHosie Poisson  [facility] 01/01/2015, 5:29 PM

## 2015-01-01 NOTE — Progress Notes (Signed)
Per [facility] rep, they are unable to provide care for pt. This CM spoke with daughter Lattie Haw and she chose [facility] [place] [facility]. Referral called to company and all information faxed to referral center. This CM will continue to follow for DC needs. Marney Doctor RN,BSN,NCM [phone]

## 2015-01-02 NOTE — Progress Notes (Signed)
Patient discharged to home via [facility] to home with daughter and Hospice following. Discharge packet sent with patient.

## 2015-01-02 NOTE — Progress Notes (Signed)
BSW Intern received consult for pt transport to home w/ daughter w/ [facility]. BSW Intern called daughter to confirm address.  BSW Intern completed med necessity form for [facility] and RN to call [facility] once RN is informed medical equipment has arrived at home.  RN to call [facility] at [phone].  No further SW needs at this time.  BSW Intern Signing off.  Harlon Flor, Tees Toh Intern Clinical Social Work Department  [phone]

## 2015-01-02 NOTE — Care Management Important Message (Signed)
Important Message  Patient Details  Name: Jill Copeland MRN: [id number] Date of Birth: 02/28/1932   Medicare Important Message Given:  Yes-third notification given    Camillo Flaming 01/02/2015, 11:39 AMImportant Message  Patient Details  Name: Jill Copeland MRN: [id number] Date of Birth: March 03, 1932   Medicare Important Message Given:  Yes-third notification given    Camillo Flaming 01/02/2015, 11:39 AM

## 2015-01-03 NOTE — Discharge Summary (Signed)
Physician Discharge Summary  Jill Copeland HBZ:[id number] DOB: 1931-05-09 DOA: 12/30/2014  PCP: PROVIDER NOT IN SYSTEM  Admit date: 12/30/2014 Discharge date: 01/02/2015  Time spent: 25 minutes  Recommendations for Outpatient Follow-up:   Follow up with hospice MD as needed.    Discharge Diagnoses:  Active Problems:   CAP (community acquired pneumonia)   Diabetes mellitus without complication ([place])   Alzheimer disease   Schizo affective schizophrenia ([place])   Recurrent pneumonia   Dysphagia causing pulmonary aspiration with swallowing   Arrhythmia   Esophageal stricture   Hypothyroidism   Overactive bladder   Lung nodule   Discharge Condition: improved  Diet recommendation: Dysphagia 1 (Puree);Thin (use nectar liquids if pt coughing/uncomfortable with thin and nectar decr cough)    Filed Weights   12/30/14 1013  Weight: 53.8 kg (118 lb 9.7 oz)    History of present illness:  The patient is a 81 y.o. year-old female with history of schizoaffective disorder requiring multiple visits to the emergency department, inpatient hospitalizations, Alzheimer's dementia, diabetes mellitus type 2 without complication, hiatal hernia status post several unsuccessful corrective surgeries, dysphagia with recurrent aspiration pneumonia and previously had a jejunostomy tube which has subsequently been removed, esophageal stricture status post multiple dilations, GERD, arrhythmia with associated syncope status post pacemaker insertion, stroke, recurrent pneumonia with bacteremia, hypothyroidism who presented with 2 week history of cough and fall. She is from SNF and has had progressive decline over the last couple of years. In the emergency department, she was afebrile with stable vital signs except for hypoxia to 86% on room air. CT of the head demonstrated no acute abnormalities with stable atrophy, and arthritic changes. Chest CT demonstrated probable metastatic cancer and aspiration  pneumonia with airway plugging of the right and left lower lobes. She was admitted and started on antibiotics.Marland Kitchen   Hospital Course:  Acute hypoxic respiratory failure due to probable aspiration pneumonia. She was started on clindamycin and aspiration precautions - Continue clindamycin and florastor to complete the course.  - Minimize blood draws if possible - BCx NGTD  Dysphagia with aspiration - Dysphagia 1 diet with nectar thick , further recommendations as per speech. - Speech therapy assessment done.  - Aspiration precautions - No evidence of recurrent stricture on CT scan  Pulmonary nodules and mediastinal lymphadenopathy highly concerning for metastatic cancer - Appreciate pulmonology recommendations - D/c Quantiferon gold, HIV, and other tests - Plan on home with hospice and DNR - case manager is aware  Diabetes mellitus type 2,  hgab1c is 6.9.   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 Continue sodium chloride   Procedures:  none  Consultations:  Speech evaluation.   Discharge Exam: Filed Vitals:   01/02/15 0653  BP: 141/46  Pulse: 61  Temp: 97.9 F (36.6 C)  Resp: 18    General: alert afebrile comfortable Cardiovascular: s1s2 Respiratory: ctab  Discharge Instructions   Discharge Instructions    Discharge instructions    Complete by:  As directed   Follow up with hospice MD AS needed.          Discharge Medication List as of 01/02/2015  6:00 PM    START taking these medications   Details  clindamycin (CLEOCIN) 300 MG capsule Take 1 capsule (300 mg total) by mouth  3 (three) times daily., Starting 01/01/2015, Until Discontinued, Print    oxybutynin (DITROPAN) 5 MG  tablet Take 1 tablet (5 mg total) by mouth 3 (three) times daily., Starting 01/01/2015, Until Discontinued, Print    saccharomyces boulardii (FLORASTOR) 250 MG capsule Take 1 capsule (250 mg total) by mouth 2 (two) times daily., Starting 01/01/2015, Until Discontinued, Print      CONTINUE these medications which have NOT CHANGED   Details  acetaminophen (TYLENOL) 500 MG tablet Take 500 mg by mouth 3 (three) times daily., Until Discontinued, Historical Med    benztropine (COGENTIN) 1 MG tablet Take 1 mg by mouth 2 (two) times daily., Until Discontinued, Historical Med    chlordiazePOXIDE (LIBRIUM) 25 MG capsule Take 25 mg by mouth every evening. At 7pm, Until Discontinued, Historical Med    docusate sodium (COLACE) 100 MG capsule Take 100 mg by mouth 2 (two) times daily., Until Discontinued, Historical Med    levothyroxine (SYNTHROID, LEVOTHROID) 75 MCG tablet Take 75 mcg by mouth daily before breakfast., Until Discontinued, Historical Med    OLANZapine (ZYPREXA) 20 MG tablet Take 20 mg by mouth at bedtime., Until Discontinued, Historical Med    pantoprazole (PROTONIX) 40 MG tablet Take 40 mg by mouth daily., Until Discontinued, Historical Med    rivastigmine (EXELON) 4.6 mg/24hr Place 4.6 mg onto the skin daily., Until Discontinued, Historical Med    sodium chloride 1 G tablet Take 1 g by mouth 3 (three) times daily., Until Discontinued, Historical Med    traMADol (ULTRAM) 50 MG tablet Take 50 mg by mouth 2 (two) times daily as needed for moderate pain., Until Discontinued, Historical Med    traZODone (DESYREL) 50 MG tablet Take 50 mg by mouth every 8 (eight) hours as needed. For agitation, Until Discontinued, Historical Med       Allergies  Allergen Reactions  . Penicillins     Unknown reaction - noted on MAR. Has patient had a PCN reaction causing immediate rash, facial/tongue/throat swelling, SOB or lightheadedness with hypotension:  Has patient had a PCN reaction causing  severe rash involving mucus membranes or skin necrosis:  Has patient had a PCN reaction that required hospitalization  Has patient had a PCN reaction occurring within the last 10 years:  If all of the above answers are "NO", then may p      The results of significant diagnostics from this hospitalization (including imaging, microbiology, ancillary and laboratory) are listed below for reference.    Significant Diagnostic Studies: Dg Chest 1 View  12/30/2014  CLINICAL DATA:  Pain following fall.  Cough and congestion. EXAM: CHEST 1 VIEW COMPARISON:  None FINDINGS: There is moderate interstitial edema with small pleural effusions bilaterally and borderline cardiomegaly. There is mild pulmonary venous hypertension. In the periphery the right mid lung, there is a 2 x 2 cm nodular opacity. Pacemaker leads are attached to the right atrium right ventricle. No pneumothorax. No fractures are apparent. IMPRESSION: 2 x 2 cm nodular opacity in the periphery the right midlung. Advise noncontrast enhanced chest CT to further assess. Underlying congestive heart failure. No pneumothorax. Electronically Signed   By: Lowella Grip III M.D.   On: 12/30/2014 07:39   Ct Head Wo Contrast  12/30/2014  CLINICAL DATA:  Pain following fall EXAM: CT HEAD WITHOUT CONTRAST CT CERVICAL SPINE WITHOUT CONTRAST TECHNIQUE: Multidetector CT imaging of the head and cervical spine was performed following the standard protocol without intravenous contrast. Multiplanar CT image reconstructions of the cervical spine were also  generated. COMPARISON:  CT head and CT cervical spine November 21, 2014 ; chest CT December 30, 2014 FINDINGS: CT HEAD FINDINGS Mild diffuse atrophy is stable. There is no intracranial mass, hemorrhage, extra-axial fluid collection, or midline shift. There is patchy small vessel disease in the centra semiovale bilaterally. Elsewhere gray-white compartments are normal. No acute infarct apparent. Bony calvarium  appears intact. The mastoid air cells are clear. CT CERVICAL SPINE FINDINGS There is no demonstrable fracture or spondylolisthesis. Prevertebral soft tissues and predental space regions are normal. There is facet hypertrophy at most levels bilaterally with exit foraminal narrowing due to bony hypertrophy at multiple levels, stable. Exit foraminal narrowing due to bony hypertrophy is most severe at C5-6 on the right, stable. No frank disc extrusion or high-grade stenosis. There is calcification in both carotid arteries. There are areas of infiltrate in the right upper lobe in both the apical and posterior segmental regions. IMPRESSION: CT head: Stable atrophy with patchy periventricular small vessel disease. No intracranial mass, hemorrhage, or extra-axial fluid collection. No new gray-white compartment lesion. CT cervical spine: Multifocal osteoarthritic change, stable from recent prior study. Exit foraminal narrowing greatest at C5-6 on the right due to bony hypertrophy. No fracture or spondylolisthesis. Probable multifocal pneumonia right upper lobe. Please see chest radiograph report for recommendation for noncontrast enhanced chest CT to further evaluate nodular lesion more inferiorly. Bilateral carotid artery calcification. Electronically Signed   By: Lowella Grip III M.D.   On: 12/30/2014 08:13   Ct Chest W Contrast  12/30/2014  CLINICAL DATA:  Dementia, increased confusion. Hypoxia. Cough. Lung nodule. Recent pneumonia on cervical spine CT. EXAM: CT CHEST WITH CONTRAST TECHNIQUE: Multidetector CT imaging of the chest was performed during intravenous contrast administration. CONTRAST:  82m OMNIPAQUE IOHEXOL 300 MG/ML  SOLN COMPARISON:  12/30/2014 FINDINGS: Mediastinum/Nodes: Bilateral supraclavicular adenopathy, index left-sided node 1 cm in short axis, image 8 series 2. Prevascular, paratracheal, bilateral hilar, and right infrahilar adenopathy, right lower paratracheal lymph node short axis  diameter 1.8 cm. Atherosclerotic aortic arch. Moderate-sized hiatal hernia eccentric to the left. Possible anastomotic staple line above this in the mid esophagus, correlate with operative history. Pacer leads in place. I do not see any large central pulmonary emboli, but today's exam was not performed as a CT angiogram and is not considered sensitive for more peripheral pulmonary emboli. Lungs/Pleura: Scattered densities are present in both lungs. Some of these are irregular in nodule, with marginal airspace opacities and dense central opacity, and especially along the periphery of the lung, to include a 2.5 by 2.3 cm nodular opacity in the apical segment right upper lobe ; a 1.9 by 1.4 cm opacity posteriorly in the right upper lobe (image 17, series 6). ; peripheral wedge-shaped opacities in the right middle lobe and right lower lobe ; and several smaller left lung peripheral airspace opacities. Scattered bandlike opacities in both lungs suggesting atelectasis. Thinning of the left mainstem bronchus noted on image 22 series 6, with suspected plugging in the right lower lobe tracheobronchial tree on images 28-32 of series 6. Left lower lobe airway plugging on image 44 series 602. Upper abdomen: Hepatic steatosis. Musculoskeletal: Mild thoracic kyphosis. Mild thoracic spondylosis. Thinning of the left seventh rib posterolaterally. IMPRESSION: 1. Considerable mediastinal, hilar, and infrahilar adenopathy could be reactive or malignant. 2. There scattered densities in both lungs, some of which are nodular, but with the defining characteristic being more peripheral and wedge-shaped. Certainly pulmonary emboli can cause hemorrhage/pulmonary infarcts in such a pattern, but I  do not see any large central pulmonary emboli (exam not sensitive for smaller pulmonary emboli-not performed as a pulmonary angiogram). Other possibilities include multilobar pneumonia, inflammatory nodules, or underlying malignancy. Follow-up to  clearance is recommended. 3. Airway plugging in the right lower lobe and left lower lobe. 4. Moderate-sized hiatal hernia eccentric to the left. 5. Hepatic steatosis. Electronically Signed   By: Van Clines M.D.   On: 12/30/2014 10:03   Ct Cervical Spine Wo Contrast  12/30/2014  CLINICAL DATA:  Pain following fall EXAM: CT HEAD WITHOUT CONTRAST CT CERVICAL SPINE WITHOUT CONTRAST TECHNIQUE: Multidetector CT imaging of the head and cervical spine was performed following the standard protocol without intravenous contrast. Multiplanar CT image reconstructions of the cervical spine were also generated. COMPARISON:  CT head and CT cervical spine November 21, 2014 ; chest CT December 30, 2014 FINDINGS: CT HEAD FINDINGS Mild diffuse atrophy is stable. There is no intracranial mass, hemorrhage, extra-axial fluid collection, or midline shift. There is patchy small vessel disease in the centra semiovale bilaterally. Elsewhere gray-white compartments are normal. No acute infarct apparent. Bony calvarium appears intact. The mastoid air cells are clear. CT CERVICAL SPINE FINDINGS There is no demonstrable fracture or spondylolisthesis. Prevertebral soft tissues and predental space regions are normal. There is facet hypertrophy at most levels bilaterally with exit foraminal narrowing due to bony hypertrophy at multiple levels, stable. Exit foraminal narrowing due to bony hypertrophy is most severe at C5-6 on the right, stable. No frank disc extrusion or high-grade stenosis. There is calcification in both carotid arteries. There are areas of infiltrate in the right upper lobe in both the apical and posterior segmental regions. IMPRESSION: CT head: Stable atrophy with patchy periventricular small vessel disease. No intracranial mass, hemorrhage, or extra-axial fluid collection. No new gray-white compartment lesion. CT cervical spine: Multifocal osteoarthritic change, stable from recent prior study. Exit foraminal narrowing  greatest at C5-6 on the right due to bony hypertrophy. No fracture or spondylolisthesis. Probable multifocal pneumonia right upper lobe. Please see chest radiograph report for recommendation for noncontrast enhanced chest CT to further evaluate nodular lesion more inferiorly. Bilateral carotid artery calcification. Electronically Signed   By: Lowella Grip III M.D.   On: 12/30/2014 08:13    Microbiology: Recent Results (from the past 240 hour(s))  Culture, blood (routine x 2) Call MD if unable to obtain prior to antibiotics being given     Status: None (Preliminary result)   Collection Time: 12/30/14  1:05 PM  Result Value Ref Range Status   Specimen Description BLOOD LEFT HAND  Final   Special Requests IN PEDIATRIC BOTTLE 2CC  Final   Culture   Final    NO GROWTH 3 DAYS Performed at [facility]    Report Status PENDING  Incomplete  Culture, blood (routine x 2) Call MD if unable to obtain prior to antibiotics being given     Status: None (Preliminary result)   Collection Time: 12/30/14  2:05 PM  Result Value Ref Range Status   Specimen Description BLOOD RIGHT HAND  Final   Special Requests IN PEDIATRIC BOTTLE  [place]  Final   Culture   Final    NO GROWTH 3 DAYS Performed at [facility]    Report Status PENDING  Incomplete     Labs: Basic Metabolic Panel:  Recent Labs Lab 12/30/14 0728 12/31/14 0545  NA 136 136  K 4.0 4.8  CL 101 103  CO2 27 27  GLUCOSE 107* 108*  BUN  10 11  CREATININE 0.78 0.73  CALCIUM 8.7* 8.2*   Liver Function Tests:  Recent Labs Lab 12/30/14 0728  AST 25  ALT 10*  ALKPHOS 207*  BILITOT 0.4  PROT 7.8  ALBUMIN 2.8*   No results for input(s): LIPASE, AMYLASE in the last 168 hours. No results for input(s): AMMONIA in the last 168 hours. CBC:  Recent Labs Lab 12/30/14 0728 12/31/14 0545 01/01/15 1125  WBC 12.6* 7.2 5.4  NEUTROABS 10.1*  --   --   HGB 11.2* 10.5* 10.4*  HCT 36.1 32.7* 33.2*  MCV 80.0 78.8 79.8  PLT  227 PLATELET CLUMPS NOTED ON SMEAR, UNABLE TO ESTIMATE 400   Cardiac Enzymes:  Recent Labs Lab 12/30/14 0728  TROPONINI <0.03   BNP: BNP (last 3 results)  Recent Labs  12/30/14 0728  BNP 113.8*    ProBNP (last 3 results) No results for input(s): PROBNP in the last 8760 hours.  CBG: No results for input(s): GLUCAP in the last 168 hours.     Signed:  Jamell Laymon  [facility] 01/03/2015, 11:09 AM   Pt seen and examined, no new changes. Recommend to finish the antibiotic course and follow upw ith Hospice MD as needed.   Hosie Poisson, MD [phone]

## 2015-01-04 LAB — CULTURE, BLOOD (ROUTINE X 2)
CULTURE: NO GROWTH
CULTURE: NO GROWTH

## 2015-01-07 NOTE — Telephone Encounter (Signed)
errenous °

## 2015-01-12 ENCOUNTER — Observation Stay
Admission: EM | Admit: 2015-01-12 | Discharge: 2015-01-14 | Disposition: A | Discharge status: Skilled Nursing Facility | Attending: Specialist | Admitting: Specialist

## 2015-01-12 ENCOUNTER — Encounter: Payer: Self-pay | Admitting: Emergency Medicine

## 2015-01-12 ENCOUNTER — Emergency Department

## 2015-01-12 DIAGNOSIS — Z950 Presence of cardiac pacemaker: Secondary | ICD-10-CM | POA: Insufficient documentation

## 2015-01-12 DIAGNOSIS — J90 Pleural effusion, not elsewhere classified: Secondary | ICD-10-CM | POA: Insufficient documentation

## 2015-01-12 DIAGNOSIS — K219 Gastro-esophageal reflux disease without esophagitis: Secondary | ICD-10-CM | POA: Diagnosis not present

## 2015-01-12 DIAGNOSIS — Z8701 Personal history of pneumonia (recurrent): Secondary | ICD-10-CM | POA: Insufficient documentation

## 2015-01-12 DIAGNOSIS — R296 Repeated falls: Secondary | ICD-10-CM | POA: Diagnosis not present

## 2015-01-12 DIAGNOSIS — Z8673 Personal history of transient ischemic attack (TIA), and cerebral infarction without residual deficits: Secondary | ICD-10-CM | POA: Insufficient documentation

## 2015-01-12 DIAGNOSIS — W19XXXA Unspecified fall, initial encounter: Secondary | ICD-10-CM | POA: Diagnosis not present

## 2015-01-12 DIAGNOSIS — C3490 Malignant neoplasm of unspecified part of unspecified bronchus or lung: Secondary | ICD-10-CM | POA: Diagnosis not present

## 2015-01-12 DIAGNOSIS — M1990 Unspecified osteoarthritis, unspecified site: Secondary | ICD-10-CM | POA: Insufficient documentation

## 2015-01-12 DIAGNOSIS — Z880 Allergy status to penicillin: Secondary | ICD-10-CM | POA: Insufficient documentation

## 2015-01-12 DIAGNOSIS — F259 Schizoaffective disorder, unspecified: Secondary | ICD-10-CM | POA: Diagnosis not present

## 2015-01-12 DIAGNOSIS — F0390 Unspecified dementia without behavioral disturbance: Secondary | ICD-10-CM

## 2015-01-12 DIAGNOSIS — K449 Diaphragmatic hernia without obstruction or gangrene: Secondary | ICD-10-CM | POA: Insufficient documentation

## 2015-01-12 DIAGNOSIS — T148XXA Other injury of unspecified body region, initial encounter: Secondary | ICD-10-CM

## 2015-01-12 DIAGNOSIS — R918 Other nonspecific abnormal finding of lung field: Secondary | ICD-10-CM | POA: Diagnosis not present

## 2015-01-12 DIAGNOSIS — E119 Type 2 diabetes mellitus without complications: Secondary | ICD-10-CM | POA: Diagnosis not present

## 2015-01-12 DIAGNOSIS — Z79899 Other long term (current) drug therapy: Secondary | ICD-10-CM | POA: Diagnosis not present

## 2015-01-12 DIAGNOSIS — G309 Alzheimer's disease, unspecified: Secondary | ICD-10-CM | POA: Insufficient documentation

## 2015-01-12 DIAGNOSIS — I700 Atherosclerosis of aorta: Secondary | ICD-10-CM | POA: Diagnosis not present

## 2015-01-12 DIAGNOSIS — I493 Ventricular premature depolarization: Secondary | ICD-10-CM | POA: Diagnosis not present

## 2015-01-12 DIAGNOSIS — R32 Unspecified urinary incontinence: Secondary | ICD-10-CM | POA: Diagnosis not present

## 2015-01-12 DIAGNOSIS — F0280 Dementia in other diseases classified elsewhere without behavioral disturbance: Secondary | ICD-10-CM | POA: Insufficient documentation

## 2015-01-12 DIAGNOSIS — F419 Anxiety disorder, unspecified: Secondary | ICD-10-CM | POA: Diagnosis not present

## 2015-01-12 DIAGNOSIS — R531 Weakness: Secondary | ICD-10-CM | POA: Diagnosis present

## 2015-01-12 DIAGNOSIS — E039 Hypothyroidism, unspecified: Secondary | ICD-10-CM | POA: Diagnosis not present

## 2015-01-12 DIAGNOSIS — T148 Other injury of unspecified body region: Secondary | ICD-10-CM | POA: Diagnosis present

## 2015-01-12 DIAGNOSIS — Z9889 Other specified postprocedural states: Secondary | ICD-10-CM | POA: Diagnosis not present

## 2015-01-12 DIAGNOSIS — L89152 Pressure ulcer of sacral region, stage 2: Secondary | ICD-10-CM | POA: Insufficient documentation

## 2015-01-12 DIAGNOSIS — Z87440 Personal history of urinary (tract) infections: Secondary | ICD-10-CM | POA: Diagnosis not present

## 2015-01-12 DIAGNOSIS — L8990 Pressure ulcer of unspecified site, unspecified stage: Secondary | ICD-10-CM | POA: Insufficient documentation

## 2015-01-12 LAB — URINALYSIS COMPLETE WITH MICROSCOPIC (ARMC ONLY)
BACTERIA UA: NONE SEEN
Bilirubin Urine: NEGATIVE
GLUCOSE, UA: NEGATIVE mg/dL
Hgb urine dipstick: NEGATIVE
Ketones, ur: NEGATIVE mg/dL
Nitrite: NEGATIVE
Protein, ur: NEGATIVE mg/dL
Specific Gravity, Urine: 1.006 (ref 1.005–1.030)
pH: 7 (ref 5.0–8.0)

## 2015-01-12 LAB — BASIC METABOLIC PANEL
Anion gap: 7 (ref 5–15)
BUN: 10 mg/dL (ref 6–20)
CALCIUM: 8.6 mg/dL — AB (ref 8.9–10.3)
CHLORIDE: 98 mmol/L — AB (ref 101–111)
CO2: 30 mmol/L (ref 22–32)
CREATININE: 0.67 mg/dL (ref 0.44–1.00)
GFR calc non Af Amer: >60 mL/min (ref >60)
GLUCOSE: 122 mg/dL — AB (ref 65–99)
Potassium: 4.4 mmol/L (ref 3.5–5.1)
Sodium: 135 mmol/L (ref 135–145)

## 2015-01-12 LAB — CBC
HCT: 35.3 % (ref 35.0–47.0)
Hemoglobin: 11.3 g/dL — ABNORMAL LOW (ref 12.0–16.0)
MCH: 24.7 pg — AB (ref 26.0–34.0)
MCHC: 32.1 g/dL (ref 32.0–36.0)
MCV: 76.8 fL — AB (ref 80.0–100.0)
PLATELETS: 524 10*3/uL — AB (ref 150–440)
RBC: 4.59 MIL/uL (ref 3.80–5.20)
RDW: 17.4 % — ABNORMAL HIGH (ref 11.5–14.5)
WBC: 8.3 10*3/uL (ref 3.6–11.0)

## 2015-01-12 MED ORDER — RIVASTIGMINE 4.6 MG/24HR TD PT24
4.6000 mg | MEDICATED_PATCH | Freq: Every day | TRANSDERMAL | Status: DC
  Administered 2015-01-13 – 2015-01-14 (×2): 4.6 mg via TRANSDERMAL
  Filled 2015-01-12 (×2): qty 1

## 2015-01-12 MED ORDER — ONDANSETRON HCL 4 MG/2ML IJ SOLN: 4.0000 mg | Freq: Four times a day (QID) | INTRAMUSCULAR | Status: DC | PRN

## 2015-01-12 MED ORDER — ACETAMINOPHEN 650 MG RE SUPP: 650.0000 mg | Freq: Four times a day (QID) | RECTAL | Status: DC | PRN

## 2015-01-12 MED ORDER — HALOPERIDOL 1 MG PO TABS: 1.0000 mg | ORAL_TABLET | Freq: Four times a day (QID) | ORAL | Status: DC | PRN

## 2015-01-12 MED ORDER — TRAZODONE HCL 50 MG PO TABS
50.0000 mg | ORAL_TABLET | Freq: Every day | ORAL | Status: DC
  Administered 2015-01-13 (×2): 50 mg via ORAL
  Filled 2015-01-12 (×2): qty 1

## 2015-01-12 MED ORDER — HEPARIN SODIUM (PORCINE) 5000 UNIT/ML IJ SOLN
5000.0000 [IU] | Freq: Three times a day (TID) | INTRAMUSCULAR | Status: DC
  Administered 2015-01-13 (×3): 5000 [IU] via SUBCUTANEOUS
  Filled 2015-01-12 (×3): qty 1

## 2015-01-12 MED ORDER — DIVALPROEX SODIUM ER 250 MG PO TB24
500.0000 mg | ORAL_TABLET | Freq: Every day | ORAL | Status: DC
  Administered 2015-01-13 (×2): 500 mg via ORAL
  Filled 2015-01-12 (×2): qty 2

## 2015-01-12 MED ORDER — OXYBUTYNIN CHLORIDE 5 MG PO TABS
5.0000 mg | ORAL_TABLET | Freq: Three times a day (TID) | ORAL | Status: DC
Start: 2015-01-12 — End: 2015-01-15
  Administered 2015-01-13 – 2015-01-14 (×6): 5 mg via ORAL
  Filled 2015-01-12 (×6): qty 1

## 2015-01-12 MED ORDER — OXYCODONE HCL 5 MG PO TABS: 5.0000 mg | ORAL_TABLET | ORAL | Status: DC | PRN

## 2015-01-12 MED ORDER — SODIUM CHLORIDE 1 G PO TABS
1.0000 g | ORAL_TABLET | Freq: Three times a day (TID) | ORAL | Status: DC
  Administered 2015-01-13 – 2015-01-14 (×6): 1 g via ORAL
  Filled 2015-01-12 (×8): qty 1

## 2015-01-12 MED ORDER — PANTOPRAZOLE SODIUM 40 MG PO TBEC
40.0000 mg | DELAYED_RELEASE_TABLET | Freq: Every day | ORAL | Status: DC
  Administered 2015-01-13 – 2015-01-14 (×2): 40 mg via ORAL
  Filled 2015-01-12 (×2): qty 1

## 2015-01-12 MED ORDER — LORAZEPAM 0.5 MG PO TABS
0.5000 mg | ORAL_TABLET | Freq: Four times a day (QID) | ORAL | Status: DC | PRN
  Administered 2015-01-14 (×2): 0.5 mg via ORAL
  Filled 2015-01-12 (×2): qty 1

## 2015-01-12 MED ORDER — LEVOTHYROXINE SODIUM 75 MCG PO TABS
75.0000 ug | ORAL_TABLET | Freq: Every day | ORAL | Status: DC
  Administered 2015-01-13 – 2015-01-14 (×2): 75 ug via ORAL
  Filled 2015-01-12 (×2): qty 1

## 2015-01-12 MED ORDER — ONDANSETRON HCL 4 MG PO TABS: 4.0000 mg | ORAL_TABLET | Freq: Four times a day (QID) | ORAL | Status: DC | PRN

## 2015-01-12 MED ORDER — ACETAMINOPHEN 325 MG PO TABS: 650.0000 mg | ORAL_TABLET | Freq: Four times a day (QID) | ORAL | Status: DC | PRN

## 2015-01-12 MED ORDER — MORPHINE SULFATE (PF) 2 MG/ML IV SOLN: 2.0000 mg | INTRAVENOUS | Status: DC | PRN

## 2015-01-12 MED ORDER — SACCHAROMYCES BOULARDII 250 MG PO CAPS
250.0000 mg | ORAL_CAPSULE | Freq: Two times a day (BID) | ORAL | Status: DC
  Administered 2015-01-13 – 2015-01-14 (×4): 250 mg via ORAL
  Filled 2015-01-12 (×4): qty 1

## 2015-01-12 MED ORDER — SODIUM CHLORIDE 0.9 % IV SOLN
INTRAVENOUS | Status: DC
  Administered 2015-01-12: via INTRAVENOUS

## 2015-01-12 MED ORDER — OLANZAPINE 10 MG PO TABS
20.0000 mg | ORAL_TABLET | Freq: Every day | ORAL | Status: DC
Start: 2015-01-12 — End: 2015-01-15
  Administered 2015-01-13 (×2): 20 mg via ORAL
  Filled 2015-01-12 (×3): qty 2

## 2015-01-12 MED ORDER — DOCUSATE SODIUM 100 MG PO CAPS
100.0000 mg | ORAL_CAPSULE | Freq: Two times a day (BID) | ORAL | Status: DC
  Administered 2015-01-13 – 2015-01-14 (×4): 100 mg via ORAL
  Filled 2015-01-12 (×4): qty 1

## 2015-01-12 MED ORDER — TRAMADOL HCL 50 MG PO TABS: 50.0000 mg | ORAL_TABLET | Freq: Two times a day (BID) | ORAL | Status: DC | PRN

## 2015-01-12 NOTE — ED Notes (Signed)
Per pt's daughter, pt is on hospice for lung cancer, unsure of the stage of lung cancer.

## 2015-01-12 NOTE — ED Notes (Signed)
Pt resting in bed with eyes closed. Respirations even and unlabored. Pt awakens with mild stimuli. Daughter at bedside at this time.

## 2015-01-12 NOTE — H&P (Signed)
[facility] NAME: Jill Copeland    MR#:  [id number]  DATE OF BIRTH:  1932-02-24   DATE OF ADMISSION:  01/12/2015  PRIMARY CARE PHYSICIAN: Marco Collie, MD   REQUESTING/REFERRING PHYSICIAN: Thomasene Lot  CHIEF COMPLAINT:   Chief Complaint  Patient presents with  . Fall  . Weakness    HISTORY OF PRESENT ILLNESS:  Jill Copeland  is a 81 y.o. female with a known history of type 2 diabetes non-insulin-requiring, Alzheimer's disease who is presenting to multiple falls. Patient unable to provide meaningful information given mental status/medical condition history aided by daughter present at bedside. She states it patient has been suffering multiple falls greater than 3 daily. This is been a recurring issue, there is no loss of consciousness. He simply mechanical falls. She has multiple assistive devices including a walker and wheelchair however she forgets to use them. The daughter states that patient's mental status is somewhat worse than baseline. Of note patient recently released from nursing facility discharge back to home, she is also present on hospice care for lung cancer which she is also not following following with now.  PAST MEDICAL HISTORY:   Past Medical History  Diagnosis Date  . Diabetes mellitus without complication ([place])   . Alzheimer disease   . Schizo affective schizophrenia ([place])   . Recurrent pneumonia   . Dysphagia causing pulmonary aspiration with swallowing   . Pneumococcal pneumonia ([place])   . Bacteremia     several timesf  . Recurrent UTI   . Stroke (cerebrum) ([place])   . Arrhythmia     with syncope s/p PPM  . GERD (gastroesophageal reflux disease)   . Esophageal stricture   . Hiatal hernia     s/p repair with fistula  . Arthritis   . Hypothyroidism     PAST SURGICAL HISTORY:   Past Surgical History  Procedure Laterality Date  . Hernia repair    . Pacemaker insertion    . Jejunostomy feeding  tube      SOCIAL HISTORY:   Social History  Substance Use Topics  . Smoking status: Never Smoker   . Smokeless tobacco: Not on file  . Alcohol Use: No    FAMILY HISTORY:   Family History  Problem Relation Age of Onset  . Thyroid cancer Grandchild   . Thyroid nodules Daughter   . Thyroid cancer Daughter   . Dementia Mother   . Schizophrenia Maternal Aunt     DRUG ALLERGIES:   Allergies  Allergen Reactions  . Penicillins Other (See Comments)    Unknown reaction - noted on MAR. Has patient had a PCN reaction causing immediate rash, facial/tongue/throat swelling, SOB or lightheadedness with hypotension: No Has patient had a PCN reaction causing severe rash involving mucus membranes or skin necrosis: No Has patient had a PCN reaction that required hospitalization No Has patient had a PCN reaction occurring within the last 10 years: No If all of the above answers are "NO", then may p    REVIEW OF SYSTEMS:  Unable to obtain given patient's mental status/medical condition   MEDICATIONS AT HOME:   Prior to Admission medications   Medication Sig Start Date End Date Taking? Authorizing Provider  benzonatate (TESSALON) 100 MG capsule Take 100 mg by mouth 3 (three) times daily as needed for cough.   Yes Historical Provider, MD  divalproex (DEPAKOTE ER) 500 MG 24 hr tablet Take 500 mg by mouth at bedtime.  Yes Historical Provider, MD  docusate sodium (COLACE) 100 MG capsule Take 100 mg by mouth 2 (two) times daily.   Yes Historical Provider, MD  haloperidol (HALDOL) 1 MG tablet Take 1 mg by mouth every 6 (six) hours as needed for agitation.   Yes Historical Provider, MD  LORazepam (ATIVAN) 0.5 MG tablet Take 0.5 mg by mouth every 6 (six) hours as needed for anxiety.   Yes Historical Provider, MD  OLANZapine (ZYPREXA) 20 MG tablet Take 20 mg by mouth at bedtime.   Yes Historical Provider, MD  oxybutynin (DITROPAN) 5 MG tablet Take 1 tablet (5 mg total) by mouth 3 (three) times  daily. 01/01/15  Yes Hosie Poisson, MD  pantoprazole (PROTONIX) 40 MG tablet Take 40 mg by mouth daily.   Yes Historical Provider, MD  rivastigmine (EXELON) 4.6 mg/24hr Place 4.6 mg onto the skin daily.   Yes Historical Provider, MD  saccharomyces boulardii (FLORASTOR) 250 MG capsule Take 1 capsule (250 mg total) by mouth 2 (two) times daily. 01/01/15  Yes Hosie Poisson, MD  sodium chloride 1 G tablet Take 1 g by mouth 3 (three) times daily.   Yes Historical Provider, MD  traMADol (ULTRAM) 50 MG tablet Take 50 mg by mouth 2 (two) times daily as needed for moderate pain.   Yes Historical Provider, MD  traZODone (DESYREL) 50 MG tablet Take 50 mg by mouth at bedtime. For agitation   Yes Historical Provider, MD  clindamycin (CLEOCIN) 300 MG capsule Take 1 capsule (300 mg total) by mouth 3 (three) times daily. Patient not taking: Reported on 01/12/2015 01/01/15   Hosie Poisson, MD  levothyroxine (SYNTHROID, LEVOTHROID) 75 MCG tablet Take 75 mcg by mouth daily before breakfast.    Historical Provider, MD      VITAL SIGNS:  Blood pressure 144/75, pulse 71, temperature 98.6 F (37 C), temperature source Oral, resp. rate 14, height '5\' 4"'$  (1.626 m), weight 125 lb (56.7 kg), SpO2 100 %.  PHYSICAL EXAMINATION:  VITAL SIGNS: Filed Vitals:   01/12/15 2300  BP: 144/75  Pulse: 71  Temp:   Resp: 25   GENERAL:81 y.o.female currently in no acute distress.  HEAD: Normocephalic, atraumatic.  EYES: Pupils equal, round, reactive to light. Extraocular muscles intact. No scleral icterus.  MOUTH: Moist mucosal membrane. Dentition intact. No abscess noted.  EAR, NOSE, THROAT: Clear without exudates. No external lesions.  NECK: Supple. No thyromegaly. No nodules. No JVD.  PULMONARY: Clear to ascultation, without wheeze rails or rhonci. No use of accessory muscles, Good respiratory effort. good air entry bilaterally CHEST: Nontender to palpation.  CARDIOVASCULAR: S1 and S2. Regular rate and rhythm. No murmurs,  rubs, or gallops. No edema. Pedal pulses 2+ bilaterally.  GASTROINTESTINAL: Soft, nontender, nondistended. No masses. Positive bowel sounds. No hepatosplenomegaly.  MUSCULOSKELETAL: No swelling, clubbing, or edema. Range of motion full in all extremities.  NEUROLOGIC: Cranial nerves II through XII are intact. No gross focal neurological deficits. Sensation intact. Reflexes intact.  SKIN: Multiple bruises and skin tears most prominent on lower extremities No rashes, or cyanosis. Skin warm and dry. Turgor intact.  PSYCHIATRIC: Mood, affect flattened. The patient is awake, alert and oriented to self. Insight, judgment poor.    LABORATORY PANEL:   CBC  Recent Labs Lab 01/12/15 1759  WBC 8.3  HGB 11.3*  HCT 35.3  PLT 524*   ------------------------------------------------------------------------------------------------------------------  Chemistries   Recent Labs Lab 01/12/15 1759  NA 135  K 4.4  CL 98*  CO2 30  GLUCOSE 122*  BUN 10  CREATININE 0.67  CALCIUM 8.6*   ------------------------------------------------------------------------------------------------------------------  Cardiac Enzymes No results for input(s): TROPONINI in the last 168 hours. ------------------------------------------------------------------------------------------------------------------  RADIOLOGY:  Dg Chest 1 View  01/12/2015  CLINICAL DATA:  81 year old female with history of hallucinations. Recent pneumonia. EXAM: CHEST 1 VIEW COMPARISON:  Chest x-ray 08/15/2014. FINDINGS: Lung volumes are low. There are coarse interstitial markings and patchy opacities throughout the lung bases bilaterally (left greater than right), which may reflect areas of atelectasis and/or consolidation. Small left pleural effusion. No evidence of pulmonary edema. Heart size is within normal limits. Large retrocardiac opacity, compatible with a large hiatal hernia. The patient is rotated to the left on today's exam,  resulting in distortion of the mediastinal contours and reduced diagnostic sensitivity and specificity for mediastinal pathology. Atherosclerosis in the thoracic aorta. Left-sided pacemaker device in position with lead tips projecting over the expected location of the right atrial appendage and right ventricular apex. IMPRESSION: 1. Bibasilar interstitial and airspace opacities may reflect areas of atelectasis and/or consolidation. Small left pleural effusion. 2. Atherosclerosis. 3. Large hiatal hernia. Electronically Signed   By: [name] M.D.   On: 01/12/2015 19:12    EKG:   Orders placed or performed during the hospital encounter of 01/12/15  . EKG 12-Lead  . EKG 12-Lead  . ED EKG  . ED EKG    IMPRESSION AND PLAN:   81 year old Caucasian female history of type 2 diabetes non-insulin-requiring, Alzheimer's, hypothyroidism unspecified presenting with multiple falls 1. Multiple falls: Multifactorial in etiology, check vitamin D level, consult physical therapy as well as palliative care 2. Hypothyroidism unspecified: Synthroid 3. GERD without esophagitis: PPI therapy 4. Venous thromboembolism prophylactic: Heparin subcutaneous    All the records are reviewed and case discussed with ED provider. Management plans discussed with the patient, family and they are in agreement.  CODE STATUS: Full  TOTAL TIME TAKING CARE OF THIS PATIENT: 35 minutes.    Hower,  Karenann Cai.D on 01/12/2015 at 11:25 PM  Between 7am to 6pm - Pager - [phone]  After 6pm: House Pager: - [facility]  Office  [phone]  CC: Primary care physician; Marco Collie, MD

## 2015-01-12 NOTE — ED Provider Notes (Signed)
[facility] Emergency Department Provider Note  ____________________________________________  Time seen: 11/07/1936  I have reviewed the triage vital signs and the nursing notes.   HISTORY  Chief Complaint Fall and Weakness     HPI Jill Copeland is a 81 y.o. female with a history of multiple falls over the past few weeks. She was staying in a nursing home but she was released from the nursing home due to lung cancer with terminal diagnosis. She had a fall and wound up at [facility] approximately 2 weeks ago. From [facility], she was released to go home. At home she has been cared for by her daughter.  Her daughter is present in the emergency department currently. She reports the patient continues to have multiple falls. She has a skin tear down the right posterior arm. She also has skin tears on both lower legs from prior falls. The patient denies any recollection of falls. She denies any pain or acute injury.  The patient has had some support from hospice at home. The daughter reports that there is no other support and she is having difficulty caring for her mother currently.  The triage note reports that the daughter has said the patient needs a psychiatric evaluation as well. The patient is alert and communicative, although she does seem to have some cognitive impairment. The patient denies any thoughts of self-harm or harm towards others. She does not appear to be responding to any hallucinatory input.   The patient reports she is not sleeping well. The daughter confirms this and says that the medications that the patient is receiving does not seem to help her sleep. This includes Haldol, tramadol, and a benzodiazepine.    Past Medical History  Diagnosis Date  . Diabetes mellitus without complication ([place])   . Alzheimer disease   . Schizo affective schizophrenia ([place])   . Recurrent pneumonia   . Dysphagia causing pulmonary aspiration with  swallowing   . Pneumococcal pneumonia ([place])   . Bacteremia     several timesf  . Recurrent UTI   . Stroke (cerebrum) ([place])   . Arrhythmia     with syncope s/p PPM  . GERD (gastroesophageal reflux disease)   . Esophageal stricture   . Hiatal hernia     s/p repair with fistula  . Arthritis   . Hypothyroidism     Patient Active Problem List   Diagnosis Date Noted  . CAP (community acquired pneumonia) 12/30/2014  . Overactive bladder 12/30/2014  . Diabetes mellitus without complication ([place])   . Alzheimer disease   . Schizo affective schizophrenia ([place])   . Recurrent pneumonia   . Dysphagia causing pulmonary aspiration with swallowing   . Pneumococcal pneumonia ([place])   . Arrhythmia   . Esophageal stricture   . Hypothyroidism   . Lung nodule   . Dementia in chronic schizophrenia ([place]) 08/12/2014  . Social discord 08/12/2014  . Family conflict 71/08/[id number]  . Schizoaffective disorder, unspecified type [facility])     Past Surgical History  Procedure Laterality Date  . Hernia repair    . Pacemaker insertion    . Jejunostomy feeding tube      Current Outpatient Rx  Name  Route  Sig  Dispense  Refill  . acetaminophen (TYLENOL) 500 MG tablet   Oral   Take 500 mg by mouth 3 (three) times daily.         . benztropine (COGENTIN) 1 MG tablet   Oral   Take 1  mg by mouth 2 (two) times daily.         . chlordiazePOXIDE (LIBRIUM) 25 MG capsule   Oral   Take 25 mg by mouth every evening. At 7pm         . clindamycin (CLEOCIN) 300 MG capsule   Oral   Take 1 capsule (300 mg total) by mouth 3 (three) times daily.   15 capsule   0   . docusate sodium (COLACE) 100 MG capsule   Oral   Take 100 mg by mouth 2 (two) times daily.         Marland Kitchen levothyroxine (SYNTHROID, LEVOTHROID) 75 MCG tablet   Oral   Take 75 mcg by mouth daily before breakfast.         . OLANZapine (ZYPREXA) 20 MG tablet   Oral   Take 20 mg by mouth at bedtime.         Marland Kitchen oxybutynin (DITROPAN) 5 MG  tablet   Oral   Take 1 tablet (5 mg total) by mouth 3 (three) times daily.   60 tablet   0   . pantoprazole (PROTONIX) 40 MG tablet   Oral   Take 40 mg by mouth daily.         . rivastigmine (EXELON) 4.6 mg/24hr   Transdermal   Place 4.6 mg onto the skin daily.         Marland Kitchen saccharomyces boulardii (FLORASTOR) 250 MG capsule   Oral   Take 1 capsule (250 mg total) by mouth 2 (two) times daily.   30 capsule   0   . sodium chloride 1 G tablet   Oral   Take 1 g by mouth 3 (three) times daily.         . traMADol (ULTRAM) 50 MG tablet   Oral   Take 50 mg by mouth 2 (two) times daily as needed for moderate pain.         . traZODone (DESYREL) 50 MG tablet   Oral   Take 50 mg by mouth every 8 (eight) hours as needed. For agitation           Allergies Penicillins  Family History  Problem Relation Age of Onset  . Thyroid cancer Grandchild   . Thyroid nodules Daughter   . Thyroid cancer Daughter   . Dementia Mother   . Schizophrenia Maternal Aunt     Social History Social History  Substance Use Topics  . Smoking status: Never Smoker   . Smokeless tobacco: None  . Alcohol Use: No    Review of Systems  Constitutional: Negative for fever. General weakness and falls. See history of present illness ENT: Negative for sore throat. Cardiovascular: Negative for chest pain. Respiratory: Negative for cough. Gastrointestinal: Negative for abdominal pain, vomiting and diarrhea. Genitourinary: Negative for dysuria. Musculoskeletal: Falls without deformity. Skin: Multiple skin tears. Neurological: History of dementia. Communicative today.   10-point ROS otherwise negative.  ____________________________________________   PHYSICAL EXAM:  VITAL SIGNS: ED Triage Vitals  Enc Vitals Group     BP 01/12/15 1756 150/65 mmHg     Pulse Rate 01/12/15 1756 75     Resp 01/12/15 1756 18     Temp 01/12/15 1756 98.6 F (37 C)     Temp Source 01/12/15 1756 Oral     SpO2  01/12/15 1756 93 %     Weight 01/12/15 1756 125 lb (56.7 kg)     Height 01/12/15 1756 '5\' 4"'$  (1.626 m)  Head Cir --      Peak Flow --      Pain Score 01/12/15 1756 5     Pain Loc --      Pain Edu? --      Excl. in [place]? --     Constitutional:  Alert and communicative. Appears elderly and frail. No acute distress. ENT   Head: Normocephalic and atraumatic.   Nose: No congestion/rhinnorhea.    Cardiovascular: Normal rate, regular rhythm, no murmur noted Respiratory:  Normal respiratory effort, no tachypnea.    Breath sounds are clear and equal bilaterally.  Gastrointestinal: Soft and nontender. No distention.  Back: No muscle spasm, no tenderness, no CVA tenderness. Musculoskeletal: No deformity noted. Nontender with normal range of motion in all extremities.  No noted edema. Neurologic:  Normal speech and language. No gross focal neurologic deficits are appreciated.  Skin:  Healing skin tear on the right elbow with minimal patches of ecchymosis superior to this. Patient has Tegaderm dressings over both shins where she has previously injured herself by walking into a table. Psychiatric: Calm and quiet affect, with some indication of decreased comprehension. Cooperative.  ____________________________________________    LABS (pertinent positives/negatives)  Labs Reviewed  BASIC METABOLIC PANEL - Abnormal; Notable for the following:    Chloride 98 (*)    Glucose, Bld 122 (*)    Calcium 8.6 (*)    All other components within normal limits  CBC - Abnormal; Notable for the following:    Hemoglobin 11.3 (*)    MCV 76.8 (*)    MCH 24.7 (*)    RDW 17.4 (*)    Platelets 524 (*)    All other components within normal limits  URINALYSIS COMPLETEWITH MICROSCOPIC ([facility] ONLY)  CBG MONITORING, ED     ____________________________________________   EKG  ED ECG REPORT I, Chanze Teagle W, the attending physician, personally viewed and interpreted this ECG.   Date: 01/12/2015   EKG Time: 1759  Rate: 79  Rhythm: Normal sinus rhythm  Axis: Normal  Intervals: Normal  ST&T Change: Interpretation limited from artifact. No ST elevation or depression noted.   ____________________________________________    RADIOLOGY   chest x-ray:  IMPRESSION: 1. Bibasilar interstitial and airspace opacities may reflect areas of atelectasis and/or consolidation. Small left pleural effusion. 2. Atherosclerosis. 3. Large hiatal hernia.  ____________________________________________  ____________________________________________   INITIAL IMPRESSION / ASSESSMENT AND PLAN / ED COURSE  Pertinent labs & imaging results that were available during my care of the patient were reviewed by me and considered in my medical decision making (see chart for details).  Elderly appearing 81 year old female with general weakness and multiple falls. She has multiple skin tears that are old. No acute injury is noted. The daughter feels that she is not able to care for the patient at home any longer. She is a hospice patient apparently with lung cancer.  Records of an reviewed. CT scan on 12/30/2014, at [facility], shows multiple nodules in the lungs consistent with cancer.  Medical evaluation ongoing currently in the emergency department. Once this is complete, we will seek observation in the hospital for further care and social work consult for placement.  ----------------------------------------- 9:24 PM on 01/12/2015 -----------------------------------------  Chest x-ray and blood tests reviewed. Urinalysis pending. We will discuss with the hospitalist to admit for observation pending urinalysis.  ____________________________________________   FINAL CLINICAL IMPRESSION(S) / ED DIAGNOSES  Final diagnoses:  Dementia, without behavioral disturbance  General weakness  Falls, initial encounter  Multiple skin tears  Ahmed Prima, MD 01/12/15 2126

## 2015-01-12 NOTE — ED Notes (Signed)
Per EMS pt has had multiple falls. EMS states they were called out by the patient's daughter for falls and for the patient to have a psychiatric evaluation. Per EMS they were told by the patient's daughter that she was on hospice but EMS was unsure for what, EMS also states that they were told by the daughter "do not bring her home". Pt presents to ED alert and oriented to person, place, time, and situation. Pt states that she does not want to kill anyone or herself. Pt states that her daughter keeps telling her she is crazy. Pt denies A/V/T hallucinations and that she knew her daughter was going to place her but she didn't think it would be today.

## 2015-01-12 NOTE — ED Notes (Signed)
Pt resting in bed with eyes closed. NAD noted at this time. Daughter at bedside at this time. Respirations even and unlabored.

## 2015-01-13 DIAGNOSIS — L8990 Pressure ulcer of unspecified site, unspecified stage: Secondary | ICD-10-CM | POA: Insufficient documentation

## 2015-01-13 MED ORDER — ENOXAPARIN SODIUM 40 MG/0.4ML ~~LOC~~ SOLN
40.0000 mg | SUBCUTANEOUS | Status: DC
  Administered 2015-01-13: 20:00:00 40 mg via SUBCUTANEOUS
  Filled 2015-01-13: qty 0.4

## 2015-01-13 NOTE — Care Management (Signed)
Attempt to complete RNCM assessment.  Patient is currently alone, and unable to provide history.  Will follow up at a later time.  Flo Shanks from [facility] aware of admission

## 2015-01-13 NOTE — Consult Note (Signed)
WOC wound consult note Reason for Consult: Abrasions, skin tears to bilateral lower legs, right elbow.  Resolving staple line to back of head.  Staples removed.   Stage 2 pressure injury to sacrum.  Wound type:Stage 2 sacral pressure injury Full thickness skin tears to bilateral lower extremities and right elbow.  Bruising to upper and lower extremities.   Pressure Ulcer POA: Yes Measurement:Left anterior lower leg 4 cm x 6 cm x 0.2 cm (skin flap partially approximated Right anterior lower leg 2 cm x 4 cm x 0.2 cm (skin flap partially approximated Right elbow 3 scattered nonintact lesions, scabbed, dry and intact.  Bruising to bilateral upper and lower extremities.  Resolving staple line to back of head.  Staples removed.  Sacrum 3 cm x 3.2 cm x 0.2 cm erythema with 1 cm x 1 cm x 0.1 cm nonintact center.  Wound bed:100% pink and moist Drainage (amount, consistency, odor) Scant serous drainage Periwound:Bruising Dressing procedure/placement/frequency:Cleanse skin tears to bilateral lower legs and right elbow with soap and water.  Apply contact layer (Mepitel) to wound bed.  Change twice weekly Friday and Tuesday. Cleanse sacrum with soap and water.  Apply Allevyn silicone border foam dressing.  Change every 3 days and PRN soilage.  No disposable briefs, therapeutic linen to skin to promote healing.  Will not follow at this time.  Please re-consult if needed.  Domenic Moras RN BSN [place] Pager [phone]

## 2015-01-13 NOTE — Progress Notes (Addendum)
Visit made. Patient is followed by [facility]ospice and [facility] at home with a hospice diagnosis of Alzheimers disease, she is a DNR code. Patient to the ED for evaluation of multiple falls and agitation. Patient seen sitting up in the recliner, pleasant and engaging, denied pain or discomfort. Lunch tray on overbed table, patient drank her fluids and ate her magic cup, did not want the purred food she was sent. Per discussion with CSW Baxter Flattery and chart note review patient's daughter is unable to take care of her any longer at home.  Physical therapy has recommended SNF. Will await discharge plan. Patient will revoke hospice benefit if discharged to SNF for rehab. Thank you. Flo Shanks RN, BSN, [place] [facility] [place] [phone] c  UPDATE: per conversation with CSW Baxter Flattery, plan is for patient to discharge to [facility] for skilled rehab tomorrow 10/25. Writer contacted patient's daughter Lattie Haw via phone, who is unable to come to the hospital due to sick children, to sign revocation of hospice benefit form. Writer contacted hospice SW who will complete paper work with Lattie Haw in her home.

## 2015-01-13 NOTE — Plan of Care (Signed)
Problem: Discharge Progression Outcomes Goal: Other Discharge Outcomes/Goals Outcome: Progressing Patient alert to self and place. No complaints of pain or discomfort. Pt is followed at home by hospice. Needs placement.

## 2015-01-13 NOTE — Progress Notes (Signed)
Initial Nutrition Assessment  INTERVENTION:   Meals and Snacks: Cater to patient preferences. Will send cottage cheese as 3pm snack if diet order allows as pt currently on dysphagia I and cottage cheese compliant with dysphagia II Coordination of Care: Recommend SLP evaluation to diet order and liquid consistency Medical Food Supplement Therapy: will recommend Mighty Shakes on meal trays TID for added nutrition (each shake provides 300kcals and 9g protein) and Magic Cup BID as well.    NUTRITION DIAGNOSIS:   Increased nutrient needs related to wound healing as evidenced by estimated needs.  GOAL:   Patient will meet greater than or equal to 90% of their needs  MONITOR:    (Energy Intake, Anthropometrics, Digestive System, Electrolyte and Renal Profile, Skin)  REASON FOR ASSESSMENT:   Malnutrition Screening Tool (Thickened liquids, Stage II Pressure ulcer)    ASSESSMENT:   Pt admitted with mutiple falls and weakness. Pt with h/o lung cancer recently diagnosed, aspiration pna, and alzheimers dementia.   Past Medical History  Diagnosis Date  . Diabetes mellitus without complication ([place])   . Alzheimer disease   . Schizo affective schizophrenia ([place])   . Recurrent pneumonia   . Dysphagia causing pulmonary aspiration with swallowing   . Pneumococcal pneumonia ([place])   . Bacteremia     several timesf  . Recurrent UTI   . Stroke (cerebrum) ([place])   . Arrhythmia     with syncope s/p PPM  . GERD (gastroesophageal reflux disease)   . Esophageal stricture   . Hiatal hernia     s/p repair with fistula  . Arthritis   . Hypothyroidism     Diet Order:  DIET - DYS 1 Room service appropriate?: Yes; Fluid consistency:: Nectar Thick    Current Nutrition: Per daughter pt ate very well this am. Pt had greek yogurt at bedside for a snack.  Food/Nutrition-Related History: Pt reports very good appetite usually, eating 3 meals per day PTA. Pt daughter reports pt usually eats 5-6 small  soft meals per day. Daughter also reports having been followed by swallow therapist in the past.   Scheduled Medications:  . divalproex  500 mg Oral QHS  . docusate sodium  100 mg Oral BID  . heparin  5,000 Units Subcutaneous 3 times per day  . levothyroxine  75 mcg Oral QAC breakfast  . OLANZapine  20 mg Oral QHS  . oxybutynin  5 mg Oral TID  . pantoprazole  40 mg Oral Daily  . rivastigmine  4.6 mg Transdermal Daily  . saccharomyces boulardii  250 mg Oral BID  . sodium chloride  1 g Oral TID  . traZODone  50 mg Oral QHS    Continuous Medications:  . sodium chloride 75 mL/hr at 01/12/15 2356     Electrolyte/Renal Profile and Glucose Profile:   Recent Labs Lab 01/12/15 1759  NA 135  K 4.4  CL 98*  CO2 30  BUN 10  CREATININE 0.67  CALCIUM 8.6*  GLUCOSE 122*   Protein Profile: No results for input(s): ALBUMIN in the last 168 hours.  Gastrointestinal Profile: Last BM:  01/13/2015   Nutrition-Focused Physical Exam Findings: Nutrition-Focused physical exam completed. Findings are no fat depletion, no muscle depletion, and no edema, however difficult for RD to assess pt lower extremities on visit this am.   Weight Change: Pt daughter reports 30lbs weight loss in the past year and more previously. Pt reports weight of close to 100lbs currently, current documented weight of 125lbs. RD notes weight  at [facility] on 10/10 of 118lbs. (20% weight loss in one year from measured weight at [facility].   Skin:   (Stage II coccyx pressure ulcer)   Height:   Ht Readings from Last 1 Encounters:  01/12/15 '5\' 4"'$  (1.626 m)    Weight:   Wt Readings from Last 1 Encounters:  01/12/15 125 lb (56.7 kg)    Wt Readings from Last 10 Encounters:  01/12/15 125 lb (56.7 kg)  12/30/14 118 lb 9.7 oz (53.8 kg)  10/21/14 135 lb (61.236 kg)  08/11/14 125 lb 4.8 oz (56.836 kg)  08/07/14 130 lb (58.968 kg)     BMI:  Body mass index is 21.45 kg/(m^2).  Estimated Nutritional Needs:    Kcal:  BEE: 1017kcals, TEE: (IF 1.1-1.3)(AF 1.2) 1342-1586kcals  Protein:  68-85g protein (1.2-1.5g/kg)  Fluid:  1418-1724m of fluid (25-340mkg)  EDUCATION NEEDS:   No education needs identified at this time   HISummit LakeRD, LDN Pager ([phone]

## 2015-01-13 NOTE — Clinical Social Work Placement (Signed)
   CLINICAL SOCIAL WORK PLACEMENT  NOTE  Date:  01/13/2015  Patient Details  Name: Jill Copeland MRN: [id number] Date of Birth: 30-Jun-1931  Clinical Social Work is seeking post-discharge placement for this patient at the [place] level of care (*CSW will initial, date and re-position this form in  chart as items are completed):  Yes   Patient/family provided with [facility] Work Department's list of facilities offering this level of care within the geographic area requested by the patient (or if unable, by the patient's family).  Yes   Patient/family informed of their freedom to choose among providers that offer the needed level of care, that participate in Medicare, Medicaid or managed care program needed by the patient, have an available bed and are willing to accept the patient.  Yes   Patient/family informed of [facility]'s ownership interest in [facility] and [facility], as well as of the fact that they are under no obligation to receive care at these facilities.  PASRR submitted to EDS on 01/13/15     PASRR number received on       Existing PASRR number confirmed on       FL2 transmitted to all facilities in geographic area requested by pt/family on 01/13/15     FL2 transmitted to all facilities within larger geographic area on       Patient informed that his/her managed care company has contracts with or will negotiate with certain facilities, including the following:            Patient/family informed of bed offers received.  Patient chooses bed at       Physician recommends and patient chooses bed at      Patient to be transferred to   on  .  Patient to be transferred to facility by       Patient family notified on   of transfer.  Name of family member notified:        PHYSICIAN       Additional Comment:    _______________________________________________ Alonna Buckler, LCSW 01/13/2015, 8:58 PM

## 2015-01-13 NOTE — Plan of Care (Signed)
Problem: Discharge Progression Outcomes Goal: Other Discharge Outcomes/Goals Outcome: Progressing Patient has no complaints of pain. VSS. Tolerating diet well, needs assistance at meals. Worked with physical therapy, up in chair for couple of hours. Possible discharge tomorrow.

## 2015-01-13 NOTE — Progress Notes (Signed)
Jill Copeland NAME: Jill Copeland    MR#:  [id number]  DATE OF BIRTH:  09/26/1931  SUBJECTIVE:  CHIEF COMPLAINT:   Chief Complaint  Patient presents with  . Fall  . Weakness   Pt. Here due to recurrent falls.  No acute events overnight.  Seen by PT and they recommend Long term care.    REVIEW OF SYSTEMS:    Review of Systems  Unable to perform ROS: dementia   Nutrition: dysphagia 1 Tolerating Diet: Yes Tolerating PT: Evaluation noted.    DRUG ALLERGIES:   Allergies  Allergen Reactions  . Penicillins Other (See Comments)    Unknown reaction - noted on MAR. Has patient had a PCN reaction causing immediate rash, facial/tongue/throat swelling, SOB or lightheadedness with hypotension: No Has patient had a PCN reaction causing severe rash involving mucus membranes or skin necrosis: No Has patient had a PCN reaction that required hospitalization No Has patient had a PCN reaction occurring within the last 10 years: No If all of the above answers are "NO", then may p    VITALS:  Blood pressure 140/54, pulse 75, temperature 99.4 F (37.4 C), temperature source Oral, resp. rate 20, height '5\' 4"'$  (1.626 m), weight 56.7 kg (125 lb), SpO2 98 %.  PHYSICAL EXAMINATION:   Physical Exam  GENERAL:  81 y.o.-year-old patient lying in the bed with no acute distress.  EYES: Pupils equal, round, reactive to light and accommodation. No scleral icterus. Extraocular muscles intact.  HEENT: Head atraumatic, normocephalic. Oropharynx and nasopharynx clear.  NECK:  Supple, no jugular venous distention. No thyroid enlargement, no tenderness.  LUNGS: Good a/e B/l.  Bibasilar rales. No rhonchi, wheezing. No use of accessory muscles of respiration.  CARDIOVASCULAR: S1, S2 normal. No murmurs, rubs, or gallops.  ABDOMEN: Soft, nontender, nondistended. Bowel sounds present. No organomegaly or mass.  EXTREMITIES: No cyanosis, clubbing or edema  b/l.    NEUROLOGIC: Cranial nerves II through XII are intact. No focal Motor or sensory deficits b/l.  G.obally weak PSYCHIATRIC: The patient is alert and oriented x 1.   SKIN: No obvious rash, lesion, or ulcer.    LABORATORY PANEL:   CBC  Recent Labs Lab 01/12/15 1759  WBC 8.3  HGB 11.3*  HCT 35.3  PLT 524*   ------------------------------------------------------------------------------------------------------------------  Chemistries   Recent Labs Lab 01/12/15 1759  NA 135  K 4.4  CL 98*  CO2 30  GLUCOSE 122*  BUN 10  CREATININE 0.67  CALCIUM 8.6*   ------------------------------------------------------------------------------------------------------------------  Cardiac Enzymes No results for input(s): TROPONINI in the last 168 hours. ------------------------------------------------------------------------------------------------------------------  RADIOLOGY:  Dg Chest 1 View  01/12/2015  CLINICAL DATA:  81 year old female with history of hallucinations. Recent pneumonia. EXAM: CHEST 1 VIEW COMPARISON:  Chest x-ray 08/15/2014. FINDINGS: Lung volumes are low. There are coarse interstitial markings and patchy opacities throughout the lung bases bilaterally (left greater than right), which may reflect areas of atelectasis and/or consolidation. Small left pleural effusion. No evidence of pulmonary edema. Heart size is within normal limits. Large retrocardiac opacity, compatible with a large hiatal hernia. The patient is rotated to the left on today's exam, resulting in distortion of the mediastinal contours and reduced diagnostic sensitivity and specificity for mediastinal pathology. Atherosclerosis in the thoracic aorta. Left-sided pacemaker device in position with lead tips projecting over the expected location of the right atrial appendage and right ventricular apex. IMPRESSION: 1. Bibasilar interstitial and airspace opacities may reflect areas of  atelectasis and/or  consolidation. Small left pleural effusion. 2. Atherosclerosis. 3. Large hiatal hernia. Electronically Signed   By: Jill Copeland M.D.   On: 01/12/2015 19:12      ASSESSMENT AND PLAN:   81 yo female w/ hx of Dementia, schizoaffective disorder, DM, hx of CVA, GERD, Arthritis, Hypothyroidism, hx of recurrent UTI's, who presented to the hospital due to recurrent falls.    1. Recurrent Falls - likely mechanical in nature due to weakness, underlying dementia, deconditioning.  - seen by PT and likely needs long term care.  Social work made aware and likely d/c to SNF tomorrow.   2. Hypothyroidism - cont. Synthroid.   3. Hx of Dementia w/out behavioral disturbance - cont. Rivastigmine, Depakote, Zyprexa.   4. GERD - cont. Protonix.   5. Anxiety - cont. As needed Ativan.     All the records are reviewed and case discussed with Care Management/Social Workerr. Management plans discussed with the patient, family and they are in agreement.  CODE STATUS: Full   DVT Prophylaxis: Heparin SQ   TOTAL TIME TAKING CARE OF THIS PATIENT: 25 minutes.   POSSIBLE D/C IN 1-2 DAYS, DEPENDING ON CLINICAL CONDITION.   Jill Copeland M.D on 01/13/2015 at 3:14 PM  Between 7am to 6pm - Pager - [phone]  After 6pm go to [email or web address] - password [email or web address] [facility]  [place] [facility]  Office  [phone]  CC: Primary care physician; Jill Collie, MD

## 2015-01-13 NOTE — Evaluation (Signed)
Physical Therapy Evaluation Patient Details Name: Jill Copeland MRN: [id number] DOB: Jul 26, 1931 Today's Date: 01/13/2015   History of Present Illness  Pt is an 81yo white female who was recently at [place] long last week for PNA, at which point per daughter, lung masses were identified on CT scan, believed to be lung CA. Pt returned to home on 2LO2, but continues to fall multiple times per day. PMH includes alzheimer's demetentia and DM.   Clinical Impression  Pt is received semirecumbent in bed upon entry, awake, alert, and willing to participate. No acute distress noted. Pt is able to provide some history but is confused at baseline, hence daughter assists with history. Daughter reports that pt falls about 3x per day on average, and has been falling for a number of years. Strength as screened by functional mobility assessment presenting with moderate weakness (see below.) Pt falls risk is high as evidenced by slow gait speed, poor forward reach, and altered posturing during transfers and mobility. Pt also is noncompliant with AD, likely related to memory deficits from Alzheimer's. Pt has been on O2 at home for less than one week, since DC from [facility], however, SaO2 remains stable with all activity on 2L during evaluation. Patient presenting with impairment of strength, balance, oxygen perfusion, and activity tolerance, limiting ability to perform ADL and mobility tasks at  baseline level of function. Pt appears to be presenting at baseline, and does not warrant skilled PT services at this point in time. PT is recommending long-term care placement at this time.     Follow Up Recommendations Supervision for mobility/OOB;Supervision/Assistance - 24 hour;Other (comment) (Long term care placement recommended. )    Equipment Recommendations  None recommended by PT    Recommendations for Other Services       Precautions / Restrictions Precautions Precautions: Fall      Mobility  Bed  Mobility Overal bed mobility: Needs Assistance Bed Mobility: Supine to Sit     Supine to sit: Mod assist     General bed mobility comments: Assistance required for OOB.   Transfers Overall transfer level: Needs assistance Equipment used: None;1 person hand held assist Transfers: Sit to/from Stand (Transfers 3x. ) Sit to Stand: Min guard         General transfer comment: Expresses decreased stability and safety and asks for help to avoid falling.   Ambulation/Gait Ambulation/Gait assistance: Min guard Ambulation Distance (Feet):  (bed to chair) Assistive device: 1 person hand held assist;None       General Gait Details: Small choppy steps, appears very unstable, able to self support holding onto PT.   Stairs            Wheelchair Mobility    Modified Rankin (Stroke Patients Only)       Balance Overall balance assessment: No apparent balance deficits (not formally assessed);History of Falls (Performs 5x sit-to-stand in 56 seconds, loses balance when legs are not touching bed, requiring minA from PT to stabilize. )                                           Pertinent Vitals/Pain Pain Assessment: No/denies pain    Home Living Family/patient expects to be discharged to:: Private residence Living Arrangements: Children Available Help at Discharge: Family (hospice RN helping 2x/week. ) Type of Home: Other(Comment) ([place]home) Home Access: Level entry     Home  Layout: Two level;Able to live on main level with bedroom/bathroom Home Equipment: Wheelchair - manual;Cane - single point Agricultural consultant. )      Prior Function Level of Independence: Needs assistance   Gait / Transfers Assistance Needed: household distances, does not use AD as directed, multiple falls.   ADL's / Homemaking Assistance Needed: Assistanc ewith ADLs, except self feeding.         Hand Dominance        Extremity/Trunk Assessment   Upper Extremity Assessment:  Generalized weakness           Lower Extremity Assessment: Generalized weakness (requires ModA for bed mobility/ MinA for transfers. )      Cervical / Trunk Assessment: Kyphotic  Communication   Communication: No difficulties  Cognition Arousal/Alertness: Awake/alert;Lethargic Behavior During Therapy: Restless;Anxious (moderately confused. ) Overall Cognitive Status: History of cognitive impairments - at baseline       Memory: Decreased recall of precautions;Decreased short-term memory              General Comments      Exercises        Assessment/Plan    PT Assessment All further PT needs can be met in the next venue of care  PT Diagnosis Difficulty walking;Abnormality of gait;Generalized weakness;Altered mental status   PT Problem List Decreased strength;Decreased knowledge of use of DME;Decreased safety awareness;Decreased activity tolerance;Decreased balance;Decreased mobility  PT Treatment Interventions     PT Goals (Current goals can be found in the Care Plan section) Acute Rehab PT Goals PT Goal Formulation: Patient unable to participate in goal setting    Frequency     Barriers to discharge        Co-evaluation               End of Session Equipment Utilized During Treatment: Oxygen Activity Tolerance: Patient tolerated treatment well;Patient limited by lethargy Patient left: in chair;with family/visitor present;with chair alarm set;with call bell/phone within reach Nurse Communication: Mobility status;Other (comment)    Functional Assessment Tool Used: Clinical Judgment  Functional Limitation: Mobility: Walking and moving around Mobility: Walking and Moving Around Current Status [phone]): At least 40 percent but less than 60 percent impaired, limited or restricted Mobility: Walking and Moving Around Goal Status [phone]): At least 40 percent but less than 60 percent impaired, limited or restricted    Time: 1002-1027 PT Time Calculation  (min) (ACUTE ONLY): 25 min   Charges:   PT Evaluation $Initial PT Evaluation Tier I: 1 Procedure     PT G Codes:   PT G-Codes **NOT FOR INPATIENT CLASS** Functional Assessment Tool Used: Clinical Judgment  Functional Limitation: Mobility: Walking and moving around Mobility: Walking and Moving Around Current Status ([id number]): At least 40 percent but less than 60 percent impaired, limited or restricted Mobility: Walking and Moving Around Goal Status [phone]): At least 40 percent but less than 60 percent impaired, limited or restricted    Angelos Wasco C 01/13/2015, 10:55 AM  11:00 AM  Etta Grandchild, PT, DPT [place] License # [id number]
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
## 2015-01-14 LAB — VITAMIN D 25 HYDROXY (VIT D DEFICIENCY, FRACTURES): Vit D, 25-Hydroxy: 13.6 ng/mL — ABNORMAL LOW (ref 30.0–100.0)

## 2015-01-14 MED ORDER — LORAZEPAM 0.5 MG PO TABS: 0.5000 mg | ORAL_TABLET | Freq: Four times a day (QID) | ORAL | Status: DC | PRN

## 2015-01-14 MED ORDER — TRAMADOL HCL 50 MG PO TABS: 50.0000 mg | ORAL_TABLET | Freq: Two times a day (BID) | ORAL | Status: DC | PRN

## 2015-01-14 NOTE — Progress Notes (Signed)
Patient discharged to [facility] per MD orders. Report called to [place] at facility. EMS called for transport.

## 2015-01-14 NOTE — Progress Notes (Signed)
Visit made. Patient seen lying in bed, eyes closed, appeared to be resting. Discharge plan is for patient to transfer to [facility] SNF today via EMS for skilled rehab. She will revoke her hospice benefit. Revocation form signed by patient's daughter Lattie Haw. Hospice team updated.  Flo Shanks RN, BSN, [place] [facility] [place], [facility] [phone] c

## 2015-01-14 NOTE — Discharge Summary (Signed)
[facility] NAME: Jill Copeland    MR#:  [id number]  [place] OF BIRTH:  1931/07/27  DATE OF ADMISSION:  01/12/2015 ADMITTING PHYSICIAN: Lytle Butte, MD  DATE OF DISCHARGE: 01/14/2015  PRIMARY CARE PHYSICIAN: HODGES,BETH, MD    ADMISSION DIAGNOSIS:  General weakness [R53.1] Multiple skin tears [T14.8] Falls, initial encounter [W19.XXXA] Dementia, without behavioral disturbance [F03.90]  DISCHARGE DIAGNOSIS:  Active Problems:   Multiple falls   Pressure ulcer   SECONDARY DIAGNOSIS:   Past Medical History  Diagnosis Date  . Diabetes mellitus without complication ([place])   . Alzheimer disease   . Schizo affective schizophrenia ([place])   . Recurrent pneumonia   . Dysphagia causing pulmonary aspiration with swallowing   . Pneumococcal pneumonia ([place])   . Bacteremia     several timesf  . Recurrent UTI   . Stroke (cerebrum) ([place])   . Arrhythmia     with syncope s/p PPM  . GERD (gastroesophageal reflux disease)   . Esophageal stricture   . Hiatal hernia     s/p repair with fistula  . Arthritis   . Hypothyroidism     HOSPITAL COURSE:   81 yo female w/ hx of Dementia, schizoaffective disorder, DM, hx of CVA, GERD, Arthritis, Hypothyroidism, hx of recurrent UTI's, who presented to the hospital due to recurrent falls.   1. Recurrent Falls - likely mechanical in nature due to weakness, underlying dementia, deconditioning.  -There was no evidence of any infectious or metabolic source to explain her recurrent falls. Patient was seen by physical therapy and they recommended long-term care and therefore patient is being discharged to his sclerosing facility today.  2. Hypothyroidism - patient will continue  Synthroid.   3. Hx of Dementia w/out behavioral disturbance - patient will continue Rivastigmine, Depakote, Zyprexa.   4. GERD - patient will continue Protonix.   5. Anxiety - continue As needed Ativan.   6.  Urinary incontinence-patient will continue her oxybutynin.  DISCHARGE CONDITIONS:   Stable  CONSULTS OBTAINED:  Treatment Team:  Lytle Butte, MD  DRUG ALLERGIES:   Allergies  Allergen Reactions  . Penicillins Other (See Comments)    Unknown reaction - noted on MAR. Has patient had a PCN reaction causing immediate rash, facial/tongue/throat swelling, SOB or lightheadedness with hypotension: No Has patient had a PCN reaction causing severe rash involving mucus membranes or skin necrosis: No Has patient had a PCN reaction that required hospitalization No Has patient had a PCN reaction occurring within the last 10 years: No If all of the above answers are "NO", then may p    DISCHARGE MEDICATIONS:   Current Discharge Medication List    CONTINUE these medications which have CHANGED   Details  !! LORazepam (ATIVAN) 0.5 MG tablet Take 1 tablet (0.5 mg total) by mouth every 6 (six) hours as needed for anxiety. Qty: 30 tablet, Refills: 0    !! traMADol (ULTRAM) 50 MG tablet Take 1 tablet (50 mg total) by mouth 2 (two) times daily as needed for moderate pain. Qty: 30 tablet, Refills: 0     !! - Potential duplicate medications found. Please discuss with provider.    CONTINUE these medications which have NOT CHANGED   Details  benzonatate (TESSALON) 100 MG capsule Take 100 mg by mouth 3 (three) times daily as needed for cough.    divalproex (DEPAKOTE ER) 500 MG 24 hr tablet Take 500 mg by mouth at bedtime.  docusate sodium (COLACE) 100 MG capsule Take 100 mg by mouth 2 (two) times daily.    haloperidol (HALDOL) 1 MG tablet Take 1 mg by mouth every 6 (six) hours as needed for agitation.    levothyroxine (SYNTHROID, LEVOTHROID) 75 MCG tablet Take 75 mcg by mouth daily before breakfast.    !! LORazepam (ATIVAN) 0.5 MG tablet Take 0.5 mg by mouth every 6 (six) hours as needed for anxiety.    OLANZapine (ZYPREXA) 20 MG tablet Take 20 mg by mouth at bedtime.    oxybutynin  (DITROPAN) 5 MG tablet Take 1 tablet (5 mg total) by mouth 3 (three) times daily. Qty: 60 tablet, Refills: 0    pantoprazole (PROTONIX) 40 MG tablet Take 40 mg by mouth daily.    rivastigmine (EXELON) 4.6 mg/24hr Place 4.6 mg onto the skin daily.    saccharomyces boulardii (FLORASTOR) 250 MG capsule Take 1 capsule (250 mg total) by mouth 2 (two) times daily. Qty: 30 capsule, Refills: 0    sodium chloride 1 G tablet Take 1 g by mouth 3 (three) times daily.    !! traMADol (ULTRAM) 50 MG tablet Take 50 mg by mouth 2 (two) times daily as needed for moderate pain.    traZODone (DESYREL) 50 MG tablet Take 50 mg by mouth at bedtime. For agitation     !! - Potential duplicate medications found. Please discuss with provider.       DISCHARGE INSTRUCTIONS:   DIET:  Dysphagia 1 diet with nectar thick liquids  DISCHARGE CONDITION:  Stable  ACTIVITY:  Activity as tolerated  OXYGEN:  Home Oxygen: No.   Oxygen Delivery: room air  DISCHARGE LOCATION:  nursing home   If you experience worsening of your admission symptoms, develop shortness of breath, life threatening emergency, suicidal or homicidal thoughts you must seek medical attention immediately by calling 911 or calling your MD immediately  if symptoms less severe.  You Must read complete instructions/literature along with all the possible adverse reactions/side effects for all the Medicines you take and that have been prescribed to you. Take any new Medicines after you have completely understood and accpet all the possible adverse reactions/side effects.   Please note  You were cared for by a hospitalist during your hospital stay. If you have any questions about your discharge medications or the care you received while you were in the hospital after you are discharged, you can call the unit and asked to speak with the hospitalist on call if the hospitalist that took care of you is not available. Once you are discharged, your  primary care physician will handle any further medical issues. Please note that NO REFILLS for any discharge medications will be authorized once you are discharged, as it is imperative that you return to your primary care physician (or establish a relationship with a primary care physician if you do not have one) for your aftercare needs so that they can reassess your need for medications and monitor your lab values.     Today   No acute complaints presently. No fevers, chills, nausea, vomiting.  VITAL SIGNS:  Blood pressure 122/60, pulse 64, temperature 97.6 F (36.4 C), temperature source Oral, resp. rate 20, height '5\' 4"'$  (1.626 m), weight 56.7 kg (125 lb), SpO2 99 %.  I/O:   Intake/Output Summary (Last 24 hours) at 01/14/15 0858 Last data filed at 01/13/15 1700  Gross per 24 hour  Intake    480 ml  Output    150 ml  Net    330 ml    PHYSICAL EXAMINATION:   GENERAL: 81 y.o.-year-old patient lying in the bed with no acute distress.  EYES: Pupils equal, round, reactive to light and accommodation. No scleral icterus. Extraocular muscles intact.  HEENT: Head atraumatic, normocephalic. Oropharynx and nasopharynx clear.  NECK: Supple, no jugular venous distention. No thyroid enlargement, no tenderness.  LUNGS: Good a/e B/l. Bibasilar rales. No rhonchi, wheezing. No use of accessory muscles of respiration.  CARDIOVASCULAR: S1, S2 normal. No murmurs, rubs, or gallops.  ABDOMEN: Soft, nontender, nondistended. Bowel sounds present. No organomegaly or mass.  EXTREMITIES: No cyanosis, clubbing or edema b/l.  NEUROLOGIC: Cranial nerves II through XII are intact. No focal Motor or sensory deficits b/l. Globally weak PSYCHIATRIC: The patient is alert and oriented x 1.  SKIN: No obvious rash, lesion, or ulcer.   DATA REVIEW:   CBC  Recent Labs Lab 01/12/15 1759  WBC 8.3  HGB 11.3*  HCT 35.3  PLT 524*    Chemistries   Recent Labs Lab 01/12/15 1759  NA 135  K  4.4  CL 98*  CO2 30  GLUCOSE 122*  BUN 10  CREATININE 0.67  CALCIUM 8.6*    Cardiac Enzymes No results for input(s): TROPONINI in the last 168 hours.  RADIOLOGY:  Dg Chest 1 View  01/12/2015  CLINICAL DATA:  81 year old female with history of hallucinations. Recent pneumonia. EXAM: CHEST 1 VIEW COMPARISON:  Chest x-ray 08/15/2014. FINDINGS: Lung volumes are low. There are coarse interstitial markings and patchy opacities throughout the lung bases bilaterally (left greater than right), which may reflect areas of atelectasis and/or consolidation. Small left pleural effusion. No evidence of pulmonary edema. Heart size is within normal limits. Large retrocardiac opacity, compatible with a large hiatal hernia. The patient is rotated to the left on today's exam, resulting in distortion of the mediastinal contours and reduced diagnostic sensitivity and specificity for mediastinal pathology. Atherosclerosis in the thoracic aorta. Left-sided pacemaker device in position with lead tips projecting over the expected location of the right atrial appendage and right ventricular apex. IMPRESSION: 1. Bibasilar interstitial and airspace opacities may reflect areas of atelectasis and/or consolidation. Small left pleural effusion. 2. Atherosclerosis. 3. Large hiatal hernia. Electronically Signed   By: [name] M.D.   On: 01/12/2015 19:12      Management plans discussed with the patient, family and they are in agreement.  CODE STATUS:     Code Status Orders        Start     Ordered   01/12/15 2234  Full code   Continuous     01/12/15 2233      TOTAL TIME TAKING CARE OF THIS PATIENT: 40 minutes.    [name] M.D on 01/14/2015 at 8:58 AM  Between 7am to 6pm - Pager - [phone]  After 6pm go to [email or web address] - password [email or web address] [facility]  [facility]  Office  [phone]  CC: Primary care physician; Marco Collie, MD

## 2015-01-14 NOTE — Plan of Care (Signed)
Problem: Discharge Progression Outcomes Goal: Other Discharge Outcomes/Goals Plan of care progress to goal: - No complaints of pain. - Continues on 02 per [place]. - No respiratory distress noted. Will continue to monitor.

## 2015-01-17 ENCOUNTER — Emergency Department
Admission: EM | Admit: 2015-01-17 | Discharge: 2015-01-24 | Disposition: A | Discharge status: Other Acute Inpatient Hospital | Payer: Medicare Other | Attending: Emergency Medicine | Admitting: Emergency Medicine

## 2015-01-17 DIAGNOSIS — R4689 Other symptoms and signs involving appearance and behavior: Secondary | ICD-10-CM

## 2015-01-17 DIAGNOSIS — E119 Type 2 diabetes mellitus without complications: Secondary | ICD-10-CM | POA: Diagnosis not present

## 2015-01-17 DIAGNOSIS — F1310 Sedative, hypnotic or anxiolytic abuse, uncomplicated: Secondary | ICD-10-CM | POA: Diagnosis not present

## 2015-01-17 DIAGNOSIS — Z79899 Other long term (current) drug therapy: Secondary | ICD-10-CM | POA: Insufficient documentation

## 2015-01-17 DIAGNOSIS — F205 Residual schizophrenia: Secondary | ICD-10-CM | POA: Diagnosis present

## 2015-01-17 DIAGNOSIS — F911 Conduct disorder, childhood-onset type: Secondary | ICD-10-CM | POA: Insufficient documentation

## 2015-01-17 DIAGNOSIS — Z880 Allergy status to penicillin: Secondary | ICD-10-CM | POA: Insufficient documentation

## 2015-01-17 DIAGNOSIS — N3281 Overactive bladder: Secondary | ICD-10-CM | POA: Diagnosis present

## 2015-01-17 DIAGNOSIS — Z008 Encounter for other general examination: Secondary | ICD-10-CM | POA: Diagnosis present

## 2015-01-17 DIAGNOSIS — F259 Schizoaffective disorder, unspecified: Secondary | ICD-10-CM | POA: Diagnosis not present

## 2015-01-17 DIAGNOSIS — E039 Hypothyroidism, unspecified: Secondary | ICD-10-CM | POA: Diagnosis present

## 2015-01-17 LAB — CBC WITH DIFFERENTIAL/PLATELET
Basophils Absolute: 0 10*3/uL (ref 0–0.1)
Basophils Relative: 1 %
EOS ABS: 0.2 10*3/uL (ref 0–0.7)
HCT: 33.9 % — ABNORMAL LOW (ref 35.0–47.0)
Hemoglobin: 10.7 g/dL — ABNORMAL LOW (ref 12.0–16.0)
LYMPHS ABS: 1.4 10*3/uL (ref 1.0–3.6)
Lymphocytes Relative: 21 %
MCH: 24.3 pg — ABNORMAL LOW (ref 26.0–34.0)
MCHC: 31.7 g/dL — AB (ref 32.0–36.0)
MCV: 76.6 fL — ABNORMAL LOW (ref 80.0–100.0)
MONO ABS: 0.9 10*3/uL (ref 0.2–0.9)
Neutro Abs: 4.2 10*3/uL (ref 1.4–6.5)
Neutrophils Relative %: 62 %
PLATELETS: 289 10*3/uL (ref 150–440)
RBC: 4.42 MIL/uL (ref 3.80–5.20)
RDW: 17.4 % — AB (ref 11.5–14.5)
WBC: 6.8 10*3/uL (ref 3.6–11.0)

## 2015-01-17 LAB — COMPREHENSIVE METABOLIC PANEL
ALT: 14 U/L (ref 14–54)
ANION GAP: 5 (ref 5–15)
AST: 28 U/L (ref 15–41)
Albumin: 3.2 g/dL — ABNORMAL LOW (ref 3.5–5.0)
Alkaline Phosphatase: 164 U/L — ABNORMAL HIGH (ref 38–126)
BUN: 11 mg/dL (ref 6–20)
CALCIUM: 8.6 mg/dL — AB (ref 8.9–10.3)
CHLORIDE: 98 mmol/L — AB (ref 101–111)
CO2: 29 mmol/L (ref 22–32)
Creatinine, Ser: 0.69 mg/dL (ref 0.44–1.00)
Glucose, Bld: 87 mg/dL (ref 65–99)
Potassium: 4.1 mmol/L (ref 3.5–5.1)
SODIUM: 132 mmol/L — AB (ref 135–145)
Total Bilirubin: 0.6 mg/dL (ref 0.3–1.2)
Total Protein: 7.4 g/dL (ref 6.5–8.1)

## 2015-01-17 LAB — URINALYSIS COMPLETE WITH MICROSCOPIC (ARMC ONLY)
Bacteria, UA: NONE SEEN
Bilirubin Urine: NEGATIVE
Glucose, UA: NEGATIVE mg/dL
Hgb urine dipstick: NEGATIVE
Ketones, ur: NEGATIVE mg/dL
Nitrite: NEGATIVE
PH: 7 (ref 5.0–8.0)
PROTEIN: NEGATIVE mg/dL
SPECIFIC GRAVITY, URINE: 1.009 (ref 1.005–1.030)

## 2015-01-17 LAB — ACETAMINOPHEN LEVEL

## 2015-01-17 LAB — SALICYLATE LEVEL

## 2015-01-17 LAB — ETHANOL

## 2015-01-17 NOTE — ED Notes (Signed)
Pt arrives to ED via [facility] from [facility] d/t c/o inappropriate behavior for a psych evaluation. Per EMS, [facility] staff reports the pt was acting aggressively and was being uncooperative; EMS reports the pt was sitting quietly and calmly in a wheelchair upon their arrival. EMS further reports the pt has been completely calm and cooperative during transit. EMS reports the pt stated that "she just wanted to sleep and [they] wouldn't let me". Pt arrives in NAD, with respirations even, regular and unlabored.

## 2015-01-17 NOTE — ED Provider Notes (Signed)
[facility] Emergency Department Provider Note  ____________________________________________  Time seen: Approximately 11:30 PM  I have reviewed the triage vital signs and the nursing notes.   HISTORY  Chief Complaint Psychiatric Evaluation    HPI Liese Dizdarevic is a 81 y.o. female with a history of terminal lung cancer who was sent in from her skilled nursing facility for aggressive behavior. The patient says that she would like to contact her daughter and that she does not want to go back to the skilled nursing facility. She is denying any suicidal, homicidal ideation at this time. Denies any hallucinations. Denying any pain at this time.   Past Medical History  Diagnosis Date  . Diabetes mellitus without complication ([place])   . Alzheimer disease   . Schizo affective schizophrenia ([place])   . Recurrent pneumonia   . Dysphagia causing pulmonary aspiration with swallowing   . Pneumococcal pneumonia ([place])   . Bacteremia     several timesf  . Recurrent UTI   . Stroke (cerebrum) ([place])   . Arrhythmia     with syncope s/p PPM  . GERD (gastroesophageal reflux disease)   . Esophageal stricture   . Hiatal hernia     s/p repair with fistula  . Arthritis   . Hypothyroidism     Patient Active Problem List   Diagnosis Date Noted  . Pressure ulcer 01/13/2015  . Multiple falls 01/12/2015  . Overactive bladder 12/30/2014  . Diabetes mellitus without complication ([place])   . Alzheimer disease   . Schizo affective schizophrenia ([place])   . Recurrent pneumonia   . Dysphagia causing pulmonary aspiration with swallowing   . Pneumococcal pneumonia ([place])   . Arrhythmia   . Esophageal stricture   . Hypothyroidism   . Lung nodule   . Dementia in chronic schizophrenia ([place]) 08/12/2014  . Social discord 08/12/2014  . Family conflict 30/16/0109  . Schizoaffective disorder, unspecified type [facility])     Past Surgical History  Procedure Laterality Date  . Hernia  repair    . Pacemaker insertion    . Jejunostomy feeding tube      Current Outpatient Rx  Name  Route  Sig  Dispense  Refill  . benzonatate (TESSALON) 100 MG capsule   Oral   Take 100 mg by mouth 3 (three) times daily as needed for cough.         . cloNIDine (CATAPRES) 0.1 MG tablet   Oral   Take 0.1 mg by mouth 3 (three) times daily.         . divalproex (DEPAKOTE ER) 500 MG 24 hr tablet   Oral   Take 500 mg by mouth at bedtime.         . docusate sodium (COLACE) 100 MG capsule   Oral   Take 100 mg by mouth 2 (two) times daily.         Marland Kitchen donepezil (ARICEPT) 5 MG tablet   Oral   Take 5 mg by mouth at bedtime.         . haloperidol (HALDOL) 1 MG tablet   Oral   Take 1 mg by mouth every 6 (six) hours as needed for agitation.         . haloperidol (HALDOL) 2 MG/ML solution   Oral   Take 1 mg by mouth daily as needed for agitation.         Marland Kitchen levothyroxine (SYNTHROID, LEVOTHROID) 75 MCG tablet   Oral   Take 75 mcg  by mouth daily before breakfast.         . LORazepam (ATIVAN) 0.5 MG tablet   Oral   Take 1 tablet (0.5 mg total) by mouth every 6 (six) hours as needed for anxiety.   30 tablet   0   . LORazepam (ATIVAN) 2 MG/ML concentrated solution   Oral   Take 1 mg by mouth daily as needed for anxiety.         Marland Kitchen OLANZapine (ZYPREXA) 20 MG tablet   Oral   Take 20 mg by mouth at bedtime.         Marland Kitchen oxybutynin (DITROPAN) 5 MG tablet   Oral   Take 1 tablet (5 mg total) by mouth 3 (three) times daily.   60 tablet   0   . pantoprazole (PROTONIX) 40 MG tablet   Oral   Take 40 mg by mouth daily.         . rivastigmine (EXELON) 4.6 mg/24hr   Transdermal   Place 4.6 mg onto the skin daily.         Marland Kitchen saccharomyces boulardii (FLORASTOR) 250 MG capsule   Oral   Take 1 capsule (250 mg total) by mouth 2 (two) times daily.   30 capsule   0   . sodium chloride 1 G tablet   Oral   Take 1 g by mouth 3 (three) times daily.         . traMADol  (ULTRAM) 50 MG tablet   Oral   Take 50 mg by mouth 2 (two) times daily as needed for moderate pain.         . traMADol (ULTRAM) 50 MG tablet   Oral   Take 1 tablet (50 mg total) by mouth 2 (two) times daily as needed for moderate pain.   30 tablet   0   . traZODone (DESYREL) 50 MG tablet   Oral   Take 50 mg by mouth at bedtime. For agitation           Allergies Penicillins  Family History  Problem Relation Age of Onset  . Thyroid cancer Grandchild   . Thyroid nodules Daughter   . Thyroid cancer Daughter   . Dementia Mother   . Schizophrenia Maternal Aunt     Social History Social History  Substance Use Topics  . Smoking status: Never Smoker   . Smokeless tobacco: None  . Alcohol Use: No    Review of Systems Constitutional: No fever/chills Eyes: No visual changes. ENT: No sore throat. Cardiovascular: Denies chest pain. Respiratory: Denies shortness of breath. Gastrointestinal: No abdominal pain.  No nausea, no vomiting.  No diarrhea.  No constipation. Genitourinary: Negative for dysuria. Musculoskeletal: Negative for back pain. Skin: Negative for rash. Neurological: Negative for headaches, focal weakness or numbness.  10-point ROS otherwise negative.  ____________________________________________   PHYSICAL EXAM:  VITAL SIGNS: ED Triage Vitals  Enc Vitals Group     BP 01/17/15 2303 135/67 mmHg     Pulse Rate 01/17/15 2303 65     Resp 01/17/15 2303 16     Temp 01/17/15 2303 97.4 F (36.3 C)     Temp Source 01/17/15 2303 Oral     SpO2 01/17/15 2303 97 %     Weight 01/17/15 2303 125 lb (56.7 kg)     Height 01/17/15 2303 '5\' 2"'$  (1.575 m)     Head Cir --      Peak Flow --      Pain Score 01/17/15  2223 0     Pain Loc --      Pain Edu? --      Excl. in [place]? --     Constitutional: Alert and oriented. Well appearing and in no acute distress. Eyes: Conjunctivae are normal. PERRL. EOMI. Head: Atraumatic. Nose: No congestion/rhinnorhea. Mouth/Throat:  Mucous membranes are moist.  Oropharynx non-erythematous. Neck: No stridor.   Cardiovascular: Normal rate, regular rhythm. Grossly normal heart sounds.  Good peripheral circulation. Respiratory: Normal respiratory effort.  No retractions. Lungs CTAB. Gastrointestinal: Soft and nontender. No distention. No abdominal bruits. No CVA tenderness. Musculoskeletal: No lower extremity tenderness nor edema.  No joint effusions. Neurologic:  Normal speech and language. No gross focal neurologic deficits are appreciated. No gait instability. Skin:  Skin is warm, dry and intact. No rash noted. Multiple areas of ecchymosis presumably from previous falls over the past few weeks as documented in recent hospital charting. Falls appear old as the ecchymosis appears mostly purple to yellow. Psychiatric: Mood and affect are normal. Speech and behavior are normal.  ____________________________________________   LABS (all labs ordered are listed, but only abnormal results are displayed)  Labs Reviewed  URINALYSIS COMPLETEWITH MICROSCOPIC ([facility] ONLY) - Abnormal; Notable for the following:    Color, Urine YELLOW (*)    APPearance CLEAR (*)    Leukocytes, UA 1+ (*)    Squamous Epithelial / LPF 6-30 (*)    All other components within normal limits  COMPREHENSIVE METABOLIC PANEL - Abnormal; Notable for the following:    Sodium 132 (*)    Chloride 98 (*)    Calcium 8.6 (*)    Albumin 3.2 (*)    Alkaline Phosphatase 164 (*)    All other components within normal limits  URINE DRUG SCREEN, QUALITATIVE ([facility] ONLY)  CBC WITH DIFFERENTIAL/PLATELET  ETHANOL  SALICYLATE LEVEL  ACETAMINOPHEN LEVEL   ____________________________________________  EKG   ____________________________________________  RADIOLOGY   ____________________________________________   PROCEDURES   ____________________________________________   INITIAL IMPRESSION / ASSESSMENT AND PLAN / ED COURSE  Pertinent labs & imaging  results that were available during my care of the patient were reviewed by me and considered in my medical decision making (see chart for details).  We'll commit the patient for aggressive behavior. The patient is aware that she will likely need to stay overnight at the hospital to be seen by the psychiatrist tomorrow. ____________________________________________   FINAL CLINICAL IMPRESSION(S) / ED DIAGNOSES  Aggressive behavior at skilled nursing facility.    Orbie Pyo, MD 01/17/15 2352

## 2015-01-17 NOTE — BH Assessment (Signed)
Assessment Note  Jill Copeland is an 81 y.o. female presenting to the ED voluntarily via EMS from skilled nursing facility for aggressive behavior.  Pt reports all she wanted to do "was go to sleep and they wouldn't let her".  Pt reports no longer wanting to be at the facility.  Pt denies SI/HI and any visual/audio hallucinations.  Diagnosis: Aggressive behavior  Past Medical History:  Past Medical History  Diagnosis Date  . Diabetes mellitus without complication ([place])   . Alzheimer disease   . Schizo affective schizophrenia ([place])   . Recurrent pneumonia   . Dysphagia causing pulmonary aspiration with swallowing   . Pneumococcal pneumonia ([place])   . Bacteremia     several timesf  . Recurrent UTI   . Stroke (cerebrum) ([place])   . Arrhythmia     with syncope s/p PPM  . GERD (gastroesophageal reflux disease)   . Esophageal stricture   . Hiatal hernia     s/p repair with fistula  . Arthritis   . Hypothyroidism     Past Surgical History  Procedure Laterality Date  . Hernia repair    . Pacemaker insertion    . Jejunostomy feeding tube      Family History:  Family History  Problem Relation Age of Onset  . Thyroid cancer Grandchild   . Thyroid nodules Daughter   . Thyroid cancer Daughter   . Dementia Mother   . Schizophrenia Maternal Aunt     Social History:  reports that she has never smoked. She does not have any smokeless tobacco history on file. She reports that she does not drink alcohol or use illicit drugs.  Additional Social History:  Alcohol / Drug Use History of alcohol / drug use?: No history of alcohol / drug abuse  CIWA: CIWA-Ar BP: 135/67 mmHg Pulse Rate: 65 COWS:    Allergies:  Allergies  Allergen Reactions  . Penicillins Other (See Comments)    Unknown reaction - noted on MAR. Has patient had a PCN reaction causing immediate rash, facial/tongue/throat swelling, SOB or lightheadedness with hypotension: No Has patient had a PCN reaction causing  severe rash involving mucus membranes or skin necrosis: No Has patient had a PCN reaction that required hospitalization No Has patient had a PCN reaction occurring within the last 10 years: No If all of the above answers are "NO", then may p    Home Medications:  (Not in a hospital admission)  OB/GYN Status:  No LMP recorded. Patient is postmenopausal.  General Assessment Data Location of Assessment: [facility] ED TTS Assessment: In system Is this a Tele or Face-to-Face Assessment?: Face-to-Face Is this an Initial Assessment or a Re-assessment for this encounter?: Initial Assessment Marital status: Widowed Jill Copeland name: [name] Is patient pregnant?: No Pregnancy Status: No Living Arrangements: Other (Comment) (skilled nursing) Can pt return to current living arrangement?: Yes Admission Status: Voluntary Is patient capable of signing voluntary admission?: Yes Referral Source: Other Insurance type: Medicare  Medical Screening Exam ([place]) Medical Exam completed: Yes  Crisis Care Plan Living Arrangements: Other (Comment) (skilled nursing) Name of Psychiatrist: N/A Name of Therapist: N/A  Education Status Is patient currently in school?: No Current Grade: N/A Highest grade of school patient has completed: N/A Name of school: N/A Contact person: N/a  Risk to self with the past 6 months Suicidal Ideation: No Has patient been a risk to self within the past 6 months prior to admission? : No Suicidal Intent: No Has patient had any  suicidal intent within the past 6 months prior to admission? : No Is patient at risk for suicide?: No Suicidal Plan?: No Has patient had any suicidal plan within the past 6 months prior to admission? : No Access to Means: No What has been your use of drugs/alcohol within the last 12 months?: N/A Previous Attempts/Gestures: No How many times?: 0 Other Self Harm Risks: N/A Triggers for Past Attempts: None known Intentional Self Injurious  Behavior: None Family Suicide History: No Recent stressful life event(s): Other (Comment) Persecutory voices/beliefs?: No Depression: No Substance abuse history and/or treatment for substance abuse?: No Suicide prevention information given to non-admitted patients: Not applicable  Risk to Others within the past 6 months Homicidal Ideation: No Does patient have any lifetime risk of violence toward others beyond the six months prior to admission? : No Thoughts of Harm to Others: No Current Homicidal Intent: No Current Homicidal Plan: No Access to Homicidal Means: No Identified Victim: N/A History of harm to others?: No Assessment of Violence: None Noted Violent Behavior Description: N/A Does patient have access to weapons?: No Criminal Charges Pending?: No Does patient have a court date: No Is patient on probation?: No  Psychosis Hallucinations: None noted Delusions: None noted  Mental Status Report Appearance/Hygiene: In scrubs Eye Contact: Good Motor Activity: Freedom of movement Speech: Logical/coherent Level of Consciousness: Drowsy Mood: Pleasant Affect: Appropriate to circumstance Anxiety Level: None Thought Processes: Coherent Judgement: Unimpaired Orientation: Person, Place Obsessive Compulsive Thoughts/Behaviors: None  Cognitive Functioning Concentration: Good Memory: Recent Intact IQ: Average Insight: Fair Impulse Control: Fair Appetite: Fair Weight Loss: 0 Weight Gain: 0 Sleep: No Change Total Hours of Sleep: 8 Vegetative Symptoms: None  ADLScreening [facility] Assessment Services) Patient's cognitive ability adequate to safely complete daily activities?: No Patient able to express need for assistance with ADLs?: Yes Independently performs ADLs?: No  Prior Inpatient Therapy Prior Inpatient Therapy: No Prior Therapy Dates: N/a Prior Therapy Facilty/Provider(s): N/A Reason for Treatment: N/A  Prior Outpatient Therapy Prior Outpatient Therapy:  No Prior Therapy Dates: N/A Prior Therapy Facilty/Provider(s): N/a Reason for Treatment: N/a Does patient have an ACCT team?: No Does patient have Intensive In-House Services?  : No Does patient have [facility] services? : No Does patient have P4CC services?: No  ADL Screening (condition at time of admission) Patient's cognitive ability adequate to safely complete daily activities?: No Patient able to express need for assistance with ADLs?: Yes Independently performs ADLs?: No       Abuse/Neglect Assessment (Assessment to be complete while patient is alone) Physical Abuse: Denies Verbal Abuse: Denies Sexual Abuse: Denies Exploitation of patient/patient's resources: Denies Self-Neglect: Denies Values / Beliefs Cultural Requests During Hospitalization: None Spiritual Requests During Hospitalization: None Consults Spiritual Care Consult Needed: No Social Work Consult Needed: No Regulatory affairs officer (For Healthcare) Does patient have an advance directive?: No Would patient like information on creating an advanced directive?: No - patient declined information    Additional Information 1:1 In Past 12 Months?: No CIRT Risk: No Elopement Risk: No Does patient have medical clearance?: No     Disposition:  Disposition Initial Assessment Completed for this Encounter: Yes Disposition of Patient: Other dispositions Other disposition(s): Other (Comment) (Psych MD consult)  On Site Evaluation by:   Reviewed with Physician:    Oneita Hurt 01/17/2015 11:54 PM

## 2015-01-18 DIAGNOSIS — F911 Conduct disorder, childhood-onset type: Secondary | ICD-10-CM | POA: Diagnosis not present

## 2015-01-18 LAB — URINE DRUG SCREEN, QUALITATIVE (ARMC ONLY)
Amphetamines, Ur Screen: NOT DETECTED
BARBITURATES, UR SCREEN: NOT DETECTED
BENZODIAZEPINE, UR SCRN: POSITIVE — AB
CANNABINOID 50 NG, UR ~~LOC~~: NOT DETECTED
Cocaine Metabolite,Ur ~~LOC~~: NOT DETECTED
MDMA (Ecstasy)Ur Screen: NOT DETECTED
Methadone Scn, Ur: NOT DETECTED
OPIATE, UR SCREEN: NOT DETECTED
PHENCYCLIDINE (PCP) UR S: NOT DETECTED
Tricyclic, Ur Screen: NOT DETECTED

## 2015-01-18 MED ORDER — DIPHENHYDRAMINE HCL 25 MG PO CAPS
ORAL_CAPSULE | ORAL | Status: AC
  Administered 2015-01-18: 25 mg via ORAL
  Filled 2015-01-18: qty 1

## 2015-01-18 MED ORDER — SULFAMETHOXAZOLE-TRIMETHOPRIM 800-160 MG PO TABS
1.0000 | ORAL_TABLET | Freq: Two times a day (BID) | ORAL | Status: AC
  Administered 2015-01-18 – 2015-01-20 (×5): 1 via ORAL
  Filled 2015-01-18 (×6): qty 1

## 2015-01-18 MED ORDER — DONEPEZIL HCL 5 MG PO TABS
5.0000 mg | ORAL_TABLET | Freq: Every day | ORAL | Status: DC
  Administered 2015-01-18 – 2015-01-23 (×5): 5 mg via ORAL
  Filled 2015-01-18 (×6): qty 1

## 2015-01-18 MED ORDER — PANTOPRAZOLE SODIUM 40 MG PO TBEC
40.0000 mg | DELAYED_RELEASE_TABLET | Freq: Every day | ORAL | Status: DC
  Administered 2015-01-18 – 2015-01-24 (×7): 40 mg via ORAL
  Filled 2015-01-18 (×8): qty 1

## 2015-01-18 MED ORDER — CLONIDINE HCL 0.1 MG PO TABS
0.1000 mg | ORAL_TABLET | Freq: Three times a day (TID) | ORAL | Status: DC
  Administered 2015-01-18 – 2015-01-24 (×12): 0.1 mg via ORAL
  Filled 2015-01-18 (×16): qty 1

## 2015-01-18 MED ORDER — DIPHENHYDRAMINE HCL 25 MG PO CAPS
25.0000 mg | ORAL_CAPSULE | Freq: Once | ORAL | Status: AC
  Administered 2015-01-18: 25 mg via ORAL

## 2015-01-18 MED ORDER — SACCHAROMYCES BOULARDII 250 MG PO CAPS
250.0000 mg | ORAL_CAPSULE | Freq: Two times a day (BID) | ORAL | Status: DC
  Administered 2015-01-18 – 2015-01-24 (×12): 250 mg via ORAL
  Filled 2015-01-18 (×16): qty 1

## 2015-01-18 MED ORDER — OLANZAPINE 5 MG PO TABS
2.5000 mg | ORAL_TABLET | Freq: Every day | ORAL | Status: DC
  Administered 2015-01-18: 2.5 mg via ORAL
  Filled 2015-01-18 (×2): qty 1

## 2015-01-18 MED ORDER — LEVOTHYROXINE SODIUM 75 MCG PO TABS
75.0000 ug | ORAL_TABLET | Freq: Every day | ORAL | Status: DC
  Administered 2015-01-18 – 2015-01-23 (×5): 75 ug via ORAL
  Filled 2015-01-18 (×9): qty 1

## 2015-01-18 MED ORDER — OXYBUTYNIN CHLORIDE 5 MG PO TABS
5.0000 mg | ORAL_TABLET | Freq: Three times a day (TID) | ORAL | Status: DC
  Administered 2015-01-18 – 2015-01-24 (×18): 5 mg via ORAL
  Filled 2015-01-18 (×20): qty 1

## 2015-01-18 NOTE — ED Notes (Signed)
ivcpsych consult pending

## 2015-01-18 NOTE — ED Provider Notes (Signed)
-----------------------------------------   7:08 AM on 01/18/2015 -----------------------------------------   Blood pressure 135/67, pulse 65, temperature 97.4 F (36.3 C), temperature source Oral, resp. rate 16, height '5\' 2"'$  (1.575 m), weight 125 lb (56.7 kg), SpO2 97 %.  The patient had no acute events since last update.  Calm and cooperative at this time.  Disposition is pending per Psychiatry/Behavioral Medicine team recommendations.     Hinda Kehr, MD 01/18/15 [phone]

## 2015-01-18 NOTE — Consult Note (Signed)
[name] Consult   Reason for Consult:  Follow up Referring Physician:  ER Patient Identification: Jill Copeland MRN:  [id number] Principal Diagnosis: <principal problem not specified> Diagnosis:   Patient Active Problem List   Diagnosis Date Noted  . Pressure ulcer [L89.90] 01/13/2015  . Multiple falls [R29.6] 01/12/2015  . Overactive bladder [N32.81] 12/30/2014  . Diabetes mellitus without complication ([place]) [E83.1]   . Alzheimer disease [G30.9]   . Schizo affective schizophrenia ([place]) [F25.0]   . Recurrent pneumonia [J18.9]   . Dysphagia causing pulmonary aspiration with swallowing [R13.19]   . Pneumococcal pneumonia ([place]) [J13]   . Arrhythmia [I49.9]   . Esophageal stricture [K22.2]   . Hypothyroidism [E03.9]   . Lung nodule [R91.1]   . Dementia in chronic schizophrenia ([place]) [F20.5] 08/12/2014  . Social discord [Z65.8] 08/12/2014  . Family conflict [D17.6] 16/09/3708  . Schizoaffective disorder, unspecified type ([place]) [F25.9]     Total Time spent with patient: 45 minutes  Subjective:   Jill Copeland is a 81 y.o. female patient admitted with   A long H/O Dementia and has been residing at current NH for past few days and prior to that she has been living in an apt with daughter.Jill Copeland  HPI:  Pt does not like current living situation and became upset and agitated. Staff reports that she has been calm, pleasant and co-operative. And relates well about her living situation.  Past Psychiatric History:  H/O Dementia and is being treated for the same. No :H/o Suicide attemptss.  Risk to Self: Suicidal Ideation: No Suicidal Intent: No Is patient at risk for suicide?: No Suicidal Plan?: No Access to Means: No What has been your use of drugs/alcohol within the last 12 months?: N/A How many times?: 0 Other Self Harm Risks: N/A Triggers for Past Attempts: None known Intentional Self Injurious Behavior: None Risk to Others: Homicidal Ideation: No Thoughts  of Harm to Others: No Current Homicidal Intent: No Current Homicidal Plan: No Access to Homicidal Means: No Identified Victim: N/A History of harm to others?: No Assessment of Violence: None Noted Violent Behavior Description: N/A Does patient have access to weapons?: No Criminal Charges Pending?: No Does patient have a court date: No Prior Inpatient Therapy: Prior Inpatient Therapy: No Prior Therapy Dates: N/a Prior Therapy Facilty/Provider(s): N/A Reason for Treatment: N/A Prior Outpatient Therapy: Prior Outpatient Therapy: No Prior Therapy Dates: N/A Prior Therapy Facilty/Provider(s): N/a Reason for Treatment: N/a Does patient have an ACCT team?: No Does patient have Intensive In-House Services?  : No Does patient have [facility] services? : No Does patient have P4CC services?: No  Past Medical History:  Past Medical History  Diagnosis Date  . Diabetes mellitus without complication ([place])   . Alzheimer disease   . Schizo affective schizophrenia ([place])   . Recurrent pneumonia   . Dysphagia causing pulmonary aspiration with swallowing   . Pneumococcal pneumonia ([place])   . Bacteremia     several timesf  . Recurrent UTI   . Stroke (cerebrum) ([place])   . Arrhythmia     with syncope s/p PPM  . GERD (gastroesophageal reflux disease)   . Esophageal stricture   . Hiatal hernia     s/p repair with fistula  . Arthritis   . Hypothyroidism     Past Surgical History  Procedure Laterality Date  . Hernia repair    . Pacemaker insertion    . Jejunostomy feeding tube     Family History:  Family History  Problem Relation Age of Onset  . Thyroid cancer Grandchild   . Thyroid nodules Daughter   . Thyroid cancer Daughter   . Dementia Mother   . Schizophrenia Maternal Aunt    Family Psychiatric  History: none  Social History:  History  Alcohol Use No     History  Drug Use No    Social History   Social History  . Marital Status: Widowed    Spouse Name: N/A  . Number of  Children: N/A  . Years of Education: N/A   Social History Main Topics  . Smoking status: Never Smoker   . Smokeless tobacco: None  . Alcohol Use: No  . Drug Use: No  . Sexual Activity: Not Asked   Other Topics Concern  . None   Social History Narrative   Additional Social History:    History of alcohol / drug use?: No history of alcohol / drug abuse                     Allergies:   Allergies  Allergen Reactions  . Penicillins Other (See Comments)    Unknown reaction - noted on MAR. Has patient had a PCN reaction causing immediate rash, facial/tongue/throat swelling, SOB or lightheadedness with hypotension: No Has patient had a PCN reaction causing severe rash involving mucus membranes or skin necrosis: No Has patient had a PCN reaction that required hospitalization No Has patient had a PCN reaction occurring within the last 10 years: No If all of the above answers are "NO", then may p    Labs:  Results for orders placed or performed during the hospital encounter of 01/17/15 (from the past 48 hour(s))  Urinalysis complete, with microscopic ([facility] only)     Status: Abnormal   Collection Time: 01/17/15 10:30 PM  Result Value Ref Range   Color, Urine YELLOW (A) YELLOW   APPearance CLEAR (A) CLEAR   Glucose, UA NEGATIVE NEGATIVE mg/dL   Bilirubin Urine NEGATIVE NEGATIVE   Ketones, ur NEGATIVE NEGATIVE mg/dL   Specific Gravity, Urine 1.009 1.005 - 1.030   Hgb urine dipstick NEGATIVE NEGATIVE   pH 7.0 5.0 - 8.0   Protein, ur NEGATIVE NEGATIVE mg/dL   Nitrite NEGATIVE NEGATIVE   Leukocytes, UA 1+ (A) NEGATIVE   RBC / HPF 0-5 0 - 5 RBC/hpf   WBC, UA 0-5 0 - 5 WBC/hpf   Bacteria, UA NONE SEEN NONE SEEN   Squamous Epithelial / LPF 6-30 (A) NONE SEEN  Urine Drug Screen, Qualitative ([facility] only)     Status: Abnormal   Collection Time: 01/17/15 10:30 PM  Result Value Ref Range   Tricyclic, Ur Screen NONE DETECTED NONE DETECTED   Amphetamines, Ur Screen NONE DETECTED  NONE DETECTED   MDMA (Ecstasy)Ur Screen NONE DETECTED NONE DETECTED   Cocaine Metabolite,Ur [place] NONE DETECTED NONE DETECTED   Opiate, Ur Screen NONE DETECTED NONE DETECTED   Phencyclidine (PCP) Ur S NONE DETECTED NONE DETECTED   Cannabinoid 50 Ng, Ur  NONE DETECTED NONE DETECTED   Barbiturates, Ur Screen NONE DETECTED NONE DETECTED   Benzodiazepine, Ur Scrn POSITIVE (A) NONE DETECTED   Methadone Scn, Ur NONE DETECTED NONE DETECTED    Comment: (NOTE) 211  Tricyclics, urine               Cutoff 1000 ng/mL 200  Amphetamines, urine             Cutoff 1000 ng/mL 300  MDMA (Ecstasy), urine  Cutoff 500 ng/mL 400  Cocaine Metabolite, urine       Cutoff 300 ng/mL 500  Opiate, urine                   Cutoff 300 ng/mL 600  Phencyclidine (PCP), urine      Cutoff 25 ng/mL 700  Cannabinoid, urine              Cutoff 50 ng/mL 800  Barbiturates, urine             Cutoff 200 ng/mL 900  Benzodiazepine, urine           Cutoff 200 ng/mL 1000 Methadone, urine                Cutoff 300 ng/mL 1100 1200 The urine drug screen provides only a preliminary, unconfirmed 1300 analytical test result and should not be used for non-medical 1400 purposes. Clinical consideration and professional judgment should 1500 be applied to any positive drug screen result due to possible 1600 interfering substances. A more specific alternate chemical method 1700 must be used in order to obtain a confirmed analytical result.  1800 Gas chromato graphy / mass spectrometry (GC/MS) is the preferred 1900 confirmatory method.   CBC with Differential     Status: Abnormal   Collection Time: 01/17/15 10:51 PM  Result Value Ref Range   WBC 6.8 3.6 - 11.0 K/uL   RBC 4.42 3.80 - 5.20 MIL/uL   Hemoglobin 10.7 (L) 12.0 - 16.0 g/dL   HCT 33.9 (L) 35.0 - 47.0 %   MCV 76.6 (L) 80.0 - 100.0 fL   MCH 24.3 (L) 26.0 - 34.0 pg   MCHC 31.7 (L) 32.0 - 36.0 g/dL   RDW 17.4 (H) 11.5 - 14.5 %   Platelets 289 150 - 440 K/uL    Comment:  PLATELET COUNT CONFIRMED BY SMEAR   Neutrophils Relative % 62% %   Neutro Abs 4.2 1.4 - 6.5 K/uL   Lymphocytes Relative 21% %   Lymphs Abs 1.4 1.0 - 3.6 K/uL   Monocytes Relative 13% %   Monocytes Absolute 0.9 0.2 - 0.9 K/uL   Eosinophils Relative 3% %   Eosinophils Absolute 0.2 0 - 0.7 K/uL   Basophils Relative 1% %   Basophils Absolute 0.0 0 - 0.1 K/uL  Comprehensive metabolic panel     Status: Abnormal   Collection Time: 01/17/15 10:51 PM  Result Value Ref Range   Sodium 132 (L) 135 - 145 mmol/L   Potassium 4.1 3.5 - 5.1 mmol/L   Chloride 98 (L) 101 - 111 mmol/L   CO2 29 22 - 32 mmol/L   Glucose, Bld 87 65 - 99 mg/dL   BUN 11 6 - 20 mg/dL   Creatinine, Ser 0.69 0.44 - 1.00 mg/dL   Calcium 8.6 (L) 8.9 - 10.3 mg/dL   Total Protein 7.4 6.5 - 8.1 g/dL   Albumin 3.2 (L) 3.5 - 5.0 g/dL   AST 28 15 - 41 U/L   ALT 14 14 - 54 U/L   Alkaline Phosphatase 164 (H) 38 - 126 U/L   Total Bilirubin 0.6 0.3 - 1.2 mg/dL   GFR calc non Af Amer >60 >60 mL/min   GFR calc Af Amer >60 >60 mL/min    Comment: (NOTE) The eGFR has been calculated using the CKD EPI equation. This calculation has not been validated in all clinical situations. eGFR's persistently <60 mL/min signify possible Chronic Kidney Disease.    Anion gap 5  5 - 15  Ethanol     Status: None   Collection Time: 01/17/15 10:51 PM  Result Value Ref Range   Alcohol, Ethyl (B) <5 <5 mg/dL    Comment:        LOWEST DETECTABLE LIMIT FOR SERUM ALCOHOL IS 5 mg/dL FOR MEDICAL PURPOSES ONLY   Salicylate level     Status: None   Collection Time: 01/17/15 10:51 PM  Result Value Ref Range   Salicylate Lvl <1.6 2.8 - 30.0 mg/dL  Acetaminophen level     Status: Abnormal   Collection Time: 01/17/15 10:51 PM  Result Value Ref Range   Acetaminophen (Tylenol), Serum <10 (L) 10 - 30 ug/mL    Comment:        THERAPEUTIC CONCENTRATIONS VARY SIGNIFICANTLY. A RANGE OF 10-30 ug/mL MAY BE AN EFFECTIVE CONCENTRATION FOR MANY PATIENTS. HOWEVER,  SOME ARE BEST TREATED AT CONCENTRATIONS OUTSIDE THIS RANGE. ACETAMINOPHEN CONCENTRATIONS >150 ug/mL AT 4 HOURS AFTER INGESTION AND >50 ug/mL AT 12 HOURS AFTER INGESTION ARE OFTEN ASSOCIATED WITH TOXIC REACTIONS.     Current Facility-Administered Medications  Medication Dose Route Frequency Provider Last Rate Last Dose  . cloNIDine (CATAPRES) tablet 0.1 mg  0.1 mg Oral TID Paulette Blanch, MD   0.1 mg at 01/18/15 1605  . donepezil (ARICEPT) tablet 5 mg  5 mg Oral QHS Paulette Blanch, MD      . levothyroxine (SYNTHROID, LEVOTHROID) tablet 75 mcg  75 mcg Oral QAC breakfast Paulette Blanch, MD   75 mcg at 01/18/15 1002  . oxybutynin (DITROPAN) tablet 5 mg  5 mg Oral TID Paulette Blanch, MD   5 mg at 01/18/15 1605  . pantoprazole (PROTONIX) EC tablet 40 mg  40 mg Oral Daily Paulette Blanch, MD   40 mg at 01/18/15 1003  . saccharomyces boulardii (FLORASTOR) capsule 250 mg  250 mg Oral BID Paulette Blanch, MD   250 mg at 01/18/15 1003  . sulfamethoxazole-trimethoprim (BACTRIM DS,SEPTRA DS) 800-160 MG per tablet 1 tablet  1 tablet Oral Q12H Paulette Blanch, MD   1 tablet at 01/18/15 1003   Current Outpatient Prescriptions  Medication Sig Dispense Refill  . benzonatate (TESSALON) 100 MG capsule Take 100 mg by mouth 3 (three) times daily as needed for cough.    . cloNIDine (CATAPRES) 0.1 MG tablet Take 0.1 mg by mouth 3 (three) times daily.    . divalproex (DEPAKOTE ER) 500 MG 24 hr tablet Take 500 mg by mouth at bedtime.    . docusate sodium (COLACE) 100 MG capsule Take 100 mg by mouth 2 (two) times daily.    Jill Copeland donepezil (ARICEPT) 5 MG tablet Take 5 mg by mouth at bedtime.    . haloperidol (HALDOL) 1 MG tablet Take 1 mg by mouth every 6 (six) hours as needed for agitation.    . haloperidol (HALDOL) 2 MG/ML solution Take 1 mg by mouth daily as needed for agitation.    Jill Copeland levothyroxine (SYNTHROID, LEVOTHROID) 75 MCG tablet Take 75 mcg by mouth daily before breakfast.    . LORazepam (ATIVAN) 0.5 MG tablet Take 1 tablet (0.5  mg total) by mouth every 6 (six) hours as needed for anxiety. 30 tablet 0  . LORazepam (ATIVAN) 2 MG/ML concentrated solution Take 1 mg by mouth daily as needed for anxiety.    Jill Copeland OLANZapine (ZYPREXA) 20 MG tablet Take 20 mg by mouth at bedtime.    Jill Copeland oxybutynin (DITROPAN) 5 MG tablet Take 1 tablet (  5 mg total) by mouth 3 (three) times daily. 60 tablet 0  . pantoprazole (PROTONIX) 40 MG tablet Take 40 mg by mouth daily.    . rivastigmine (EXELON) 4.6 mg/24hr Place 4.6 mg onto the skin daily.    Jill Copeland saccharomyces boulardii (FLORASTOR) 250 MG capsule Take 1 capsule (250 mg total) by mouth 2 (two) times daily. 30 capsule 0  . sodium chloride 1 G tablet Take 1 g by mouth 3 (three) times daily.    . traMADol (ULTRAM) 50 MG tablet Take 50 mg by mouth 2 (two) times daily as needed for moderate pain.    . traMADol (ULTRAM) 50 MG tablet Take 1 tablet (50 mg total) by mouth 2 (two) times daily as needed for moderate pain. 30 tablet 0  . traZODone (DESYREL) 50 MG tablet Take 50 mg by mouth at bedtime. For agitation      Musculoskeletal: Strength & Muscle Tone: within normal limits Gait & Station: normal Patient leans: N/A  Psychiatric Specialty Exam: Review of Systems  All other systems reviewed and are negative.   Blood pressure 147/83, pulse 65, temperature 97.4 F (36.3 C), temperature source Oral, resp. rate 16, height '5\' 2"'  (1.575 m), weight 125 lb (56.7 kg), SpO2 97 %.Body mass index is 22.86 kg/(m^2).  General Appearance: Casual  Eye Contact::  Fair  Speech:  Clear and Coherent  Volume:  Normal  Mood:  Anxious  Affect:  Appropriate  Thought Process:  Circumstantial  Orientation:  Other:  knew she was in a Hospital and this was Oct of 2016  Thought Content:  Negative  Suicidal Thoughts:  No  Homicidal Thoughts:  No  Memory:  not tested  Judgement:  Fair  Insight:  Fair  Psychomotor Activity:  Normal  Concentration:  Fair  Recall:  [employer] of Knowledge:Fair  Language: Fair   Akathisia:  No  Handed:  Left  AIMS (if indicated):     Assets:  Communication Skills Desire for Improvement Housing Social Support Transportation  ADL's:  Intact  Cognition: WNL  Sleep:      Treatment Plan Summary: Plan Continue current meds and daughter is looking into appropriate placement and will wait for the same  Disposition: No evidence of imminent risk to self or others at present.    Dewain Penning 01/18/2015 4:21 PM

## 2015-01-19 DIAGNOSIS — F911 Conduct disorder, childhood-onset type: Secondary | ICD-10-CM | POA: Diagnosis not present

## 2015-01-19 MED ORDER — LORAZEPAM 0.5 MG PO TABS
ORAL_TABLET | ORAL | Status: AC
  Administered 2015-01-19: 0.5 mg via ORAL
  Filled 2015-01-19: qty 1

## 2015-01-19 MED ORDER — HALOPERIDOL LACTATE 5 MG/ML IJ SOLN
5.0000 mg | Freq: Once | INTRAMUSCULAR | Status: AC
  Administered 2015-01-20: 5 mg via INTRAMUSCULAR
  Filled 2015-01-19: qty 1

## 2015-01-19 MED ORDER — OLANZAPINE 5 MG PO TABS
2.5000 mg | ORAL_TABLET | ORAL | Status: AC
  Administered 2015-01-19: 2.5 mg via ORAL
  Filled 2015-01-19: qty 1

## 2015-01-19 MED ORDER — LORAZEPAM 0.5 MG PO TABS
0.5000 mg | ORAL_TABLET | Freq: Once | ORAL | Status: AC
  Administered 2015-01-19: 0.5 mg via ORAL

## 2015-01-19 NOTE — ED Notes (Signed)
NAD noted at this time. Pt requests repeatedly for head to be raised and lowered. Pt requests to sit up on side of bed. Respirations even and unlabored at this time.

## 2015-01-19 NOTE — ED Notes (Signed)
BEHAVIORAL HEALTH ROUNDING Patient sleeping: Yes.   Patient alert and oriented: UTA, pt still sleeping Behavior appropriate: Yes.  ; If no, describe:  Nutrition and fluids offered: Yes  Toileting and hygiene offered: Yes  Sitter present: yes Law enforcement present: Yes ODS

## 2015-01-19 NOTE — ED Notes (Signed)
Pt continuously saying that she can't breathe, staff checked pt O2 level and it's @ 98%, HR 81

## 2015-01-19 NOTE — ED Notes (Signed)
BEHAVIORAL HEALTH ROUNDING Patient sleeping: Yes.   Patient alert and oriented: not applicable SLEEPING Behavior appropriate: Yes.  ; If no, describe: SLEEPING Nutrition and fluids offered: No SLEEPING Toileting and hygiene offered: NoSLEEPING Sitter present: not applicable Law enforcement present: Yes O[place]

## 2015-01-19 NOTE — ED Notes (Signed)

## 2015-01-19 NOTE — ED Notes (Signed)
Pt becoming very agitated, pt continuing to ask to speak with doctor and asking staff to call her daughter to come get her. RN & MD notified and meds ordered.

## 2015-01-19 NOTE — ED Provider Notes (Signed)
And patient becoming restless, somewhat anxious appearing. I will give her a repeat dose of Zyprexa. Otherwise in no distress, does appear to be becoming slightly agitated.  Delman Kitten, MD 01/19/15 [phone]

## 2015-01-19 NOTE — ED Notes (Signed)
Pt continuing to get up out of bed with unsteady gait. Pt asking same questions over and over again. RN notified.

## 2015-01-19 NOTE — ED Notes (Signed)
NAD Noted at this time. Pt resting in bed with respirations even and unlabored. Pt requests for staff to come in and sit with her. Pt is maintained within staff eye sight at this time.

## 2015-01-19 NOTE — ED Notes (Signed)
NAD noted at this time. Pt sitting up in bed eating breakfast at this time. Pt compliant with medication at this time.

## 2015-01-19 NOTE — ED Notes (Signed)
ENVIRONMENTAL ASSESSMENT  Potentially harmful objects out of patient reach: Yes.  Personal belongings secured: Yes.  Patient dressed in hospital provided attire only: Yes.  Plastic bags out of patient reach: Yes.  Patient care equipment (cords, cables, call bells, lines, and drains) shortened, removed, or accounted for: Yes.  Equipment and supplies removed from bottom of stretcher: Yes.  Potentially toxic materials out of patient reach: Yes.  Sharps container removed or out of patient reach: Yes.   BEHAVIORAL HEALTH ROUNDING Patient sleeping: Yes.   Patient alert and oriented: not applicable SLEEPING Behavior appropriate: Yes.  ; If no, describe: SLEEPING Nutrition and fluids offered: No SLEEPING Toileting and hygiene offered: NoSLEEPING Sitter present: not applicable Law enforcement present: Yes ODS

## 2015-01-19 NOTE — ED Notes (Signed)
Pt stating "I can't breathe", Pt respirations even and unlabored, pt talking in complete and coherent sentences, pulse ox: 99% RA, lungs clear x4.

## 2015-01-19 NOTE — ED Notes (Signed)
Pt has what appears to be a stage 2 pressure ulcer on pt's sacral area with non blanching skin around the site.

## 2015-01-19 NOTE — ED Notes (Signed)
1:1 sitter placed with patient.  Colletta [place], EDT sitting.

## 2015-01-19 NOTE — ED Notes (Signed)
Pt escorted back to bed by this tech.

## 2015-01-19 NOTE — ED Notes (Signed)
NAD noted at this time. Pt resting in bed with her eyes closed. Respirations even and unlabored at this time. Pt awakens with mild stimuli.

## 2015-01-19 NOTE — ED Notes (Signed)
ED BHU [place] Is the patient under IVC or is there intent for IVC: Yes.   Is the patient medically cleared: Yes.   Is there vacancy in the ED BHU: Yes.   Is the population mix appropriate for patient: No. Is the patient awaiting placement in inpatient or outpatient setting: No. Has the patient had a psychiatric consult: Yes.   Survey of unit performed for contraband, proper placement and condition of furniture, tampering with fixtures in bathroom, shower, and each patient room: Yes.  ; Findings:  APPEARANCE/BEHAVIOR Pt appears agitated, is unable to get comfortable, consistently requests a staff member to sit by her bedside.  NEURO ASSESSMENT Orientation: time, place and person Hallucinations: No.None noted (Hallucinations) Speech: Normal Gait: unsteady RESPIRATORY ASSESSMENT Normal expansion.  Clear to auscultation.  No rales, rhonchi, or wheezing. CARDIOVASCULAR ASSESSMENT regular rate and rhythm, S1, S2 normal, no murmur, click, rub or gallop GASTROINTESTINAL ASSESSMENT soft, nontender, BS WNL, no r/g EXTREMITIES normal strength, tone, and muscle mass PLAN OF CARE Provide calm/safe environment. Vital signs assessed twice daily. ED BHU Assessment once each 12-hour shift. Collaborate with intake RN daily or as condition indicates. Assure the ED provider has rounded once each shift. Provide and encourage hygiene. Provide redirection as needed. Assess for escalating behavior; address immediately and inform ED provider.  Assess family dynamic and appropriateness for visitation as needed: Yes.  ; If necessary, describe findings:  Educate the patient/family about BHU procedures/visitation: Yes.  ; If necessary, describe findings:

## 2015-01-19 NOTE — ED Notes (Signed)
NAD noted at thsi time. Pt resting in bed quietly with even and unlabored respirations at this time.

## 2015-01-19 NOTE — ED Notes (Signed)
Patient assigned to appropriate care area. Patient oriented to unit/care area: Informed that, for their safety, care areas are designed for safety and monitored by security cameras at all times; and visiting hours explained to patient. Patient verbalizes understanding, and verbal contract for safety obtained. 

## 2015-01-19 NOTE — Clinical Social Work Note (Signed)
Clinical Social Work Assessment  Patient Details  Name: Jill Copeland MRN: [id number] Date of Birth: 1931-06-07  Date of referral:  01/12/15               Reason for consult:   (from [facility] )                Permission sought to share information with:  Facility Sport and exercise psychologist, Family Supports Permission granted to share information::     Name::        Agency::     Relationship::     Contact Information:     Housing/Transportation Living arrangements for the past 2 months:  [place], [place], [place] (several in the past two months) Source of Information:  Adult Children, Facility, Medical Team, Psychiatric Consultation Patient Interpreter Needed:  None Criminal Activity/Legal Involvement Pertinent to Current Situation/Hospitalization:    Significant Relationships:  Adult Children Lives with:  Facility Resident Do you feel safe going back to the place where you live?  No (patient does not like SNF) Need for family participation in patient care:  Yes (Comment)  Care giving concerns:  Concern that [facility] is unable to meet patient's needs and at this time patient not meeting criteria for inpatient psych.   Social Worker assessment / plan:  Holiday representative ([place]) consult, patient is from [facility] skilled nursing. Patient was sleep during time of visit   (Information provided over the phone by patient's daughter is POA Lattie Haw [phone] ).  CSW introduced self and explained role of CSW department  Patient currently lives at [facility] [place] where she has been about 1 week.  Per daughter she is concerned with patient returning to [facility] as she does not believe they can meet patients needs.  Informed daughter that patient was evaluated by Psych MD, and patient did not meet criteria for inpatient psych treatment.  dau was upset and did not agree with recommendation.  Informed daughter patient would have to discharge back to [facility] and she could coordinate a different placement if she did not like that  One.    Call to [facility], spoke to Amy, she has been instructed by administration that patient is unable to return today.  CSW call Cyndie Chime of Nursing on Monday to discuss patient coming back.     Clinical Social Worker will continue to follow patient to assist with ongoing and disposition needs.   Employment status:    Forensic scientist:  Medicare, Medicaid [place] PT Recommendations:    Information / Referral to community resources:   discussed [facility] as a possible option.   Patient/Family's Response to care:  Patient's daughter is not in agreement with plan but has not come up with an alternative at this time.  Patient/Family's Understanding of and Emotional Response to Diagnosis, Current Treatment, and Prognosis:  Patient will remain in the ED for Psych to reevaluate her today and discontinue her IVC.   Emotional Assessment Appearance:  Appears stated age Attitude/Demeanor/Rapport:  Aggressive (Verbally and/or physically) (at facility) Affect (typically observed):  Calm, Agitated Orientation:  Oriented to Self, Oriented to Place Alcohol / Substance use:    Psych involvement (Current and /or in the community):  Yes (Comment) (psych following pt)  Discharge Needs  Concerns to be addressed:  Discharge Planning Concerns, Care Coordination Readmission within the last 30 days:  Yes Current discharge risk:  Chronically ill, Cognitively Impaired, Dependent with Mobility Barriers  to Discharge:  Other, Homeless with medical needs (SNF will not take patient back today.  )   Maurine Cane, LCSW 01/19/2015, 10:39 AM

## 2015-01-19 NOTE — ED Notes (Signed)
Pt asking continuously to call daughter, staff allowed pt to call daughter and daughter begins cussing at staff. Daughter stating " I've been called over 10 times in 2 days, I sent her there for ya'll to take care of and ya'll can't even f*cing do that" pt daughter also states "I called there 12 damn times yesterday and noone would call me back and now that's I have something to do ya'll call me all f*cking day.

## 2015-01-19 NOTE — ED Notes (Signed)
Pt actively trying to get out of bed.  When handing pt water for medications pt threw cup back at this RN.  Pt stated "I'm not taking any medicine."

## 2015-01-19 NOTE — ED Notes (Signed)
NAD noted at this time. Pt resting in bed quietly with her eyes closed. Respirations are even and unlabored. Pt awakens with mild stimuli at this time.

## 2015-01-19 NOTE — ED Provider Notes (Signed)
-----------------------------------------   8:05 AM on 01/19/2015 -----------------------------------------   Blood pressure 147/83, pulse 65, temperature 97.4 F (36.3 C), temperature source Oral, resp. rate 16, height '5\' 2"'$  (1.575 m), weight 125 lb (56.7 kg), SpO2 97 %.  The patient had no acute events since last update.  Calm and cooperative at this time.  Disposition is pending per Psychiatry/Behavioral Medicine team recommendations.     Nena Polio, MD 01/19/15 [phone]

## 2015-01-20 DIAGNOSIS — F911 Conduct disorder, childhood-onset type: Secondary | ICD-10-CM | POA: Diagnosis not present

## 2015-01-20 DIAGNOSIS — F259 Schizoaffective disorder, unspecified: Secondary | ICD-10-CM | POA: Insufficient documentation

## 2015-01-20 MED ORDER — OLANZAPINE 10 MG PO TABS
20.0000 mg | ORAL_TABLET | Freq: Every day | ORAL | Status: DC
  Administered 2015-01-20 – 2015-01-23 (×4): 20 mg via ORAL
  Filled 2015-01-20 (×4): qty 2

## 2015-01-20 NOTE — ED Notes (Signed)
Pt trying to get out of bed, stating "I want to get out of here".  Pt grabbed at EDT's necklace.  Bed position has continuously been modified for patient's safety, 1:1 sitter at patient bedside.

## 2015-01-20 NOTE — ED Notes (Signed)
ED BHU [place] Is the patient under IVC or is there intent for IVC: Yes.   Is the patient medically cleared: Yes.   Is there vacancy in the ED BHU: Yes.   Is the population mix appropriate for patient: Yes.   Is the patient awaiting placement in inpatient or outpatient setting: Yes.   Has the patient had a psychiatric consult: Yes.   Survey of unit performed for contraband, proper placement and condition of furniture, tampering with fixtures in bathroom, shower, and each patient room: Yes.  ; Findings: Environment secure, Pt has a Air cabin crew.  APPEARANCE/BEHAVIOR calm and cooperative NEURO ASSESSMENT Orientation: person Hallucinations: No.None noted (Hallucinations) Speech: Slurred Gait: unsteady RESPIRATORY ASSESSMENT Normal expansion.  Clear to auscultation.  No rales, rhonchi, or wheezing., No chest wall tenderness., No kyphosis or scoliosis. CARDIOVASCULAR ASSESSMENT regular rate and rhythm, S1, S2 normal, no murmur, click, rub or gallop GASTROINTESTINAL ASSESSMENT soft, nontender, BS WNL, no r/g EXTREMITIES normal strength, tone, and muscle mass, no deformities, no erythema, induration, or nodules, no evidence of joint effusion, ROM of all joints is normal, no evidence of joint instability PLAN OF CARE Provide calm/safe environment. Vital signs assessed twice daily. ED BHU Assessment once each 12-hour shift. Collaborate with intake RN daily or as condition indicates. Assure the ED provider has rounded once each shift. Provide and encourage hygiene. Provide redirection as needed. Assess for escalating behavior; address immediately and inform ED provider.  Assess family dynamic and appropriateness for visitation as needed: No.; If necessary, describe findings: Family dynamics was not assessed because family is not present.  Educate the patient/family about BHU procedures/visitation: Yes.  ; If necessary, describe findings: Pt educated, but the Pt's mental state does  not allow her to understand.

## 2015-01-20 NOTE — ED Provider Notes (Signed)
-----------------------------------------   7:08 AM on 01/20/2015 -----------------------------------------   Blood pressure 103/45, pulse 60, temperature 98.7 F (37.1 C), temperature source Oral, resp. rate 18, height '5\' 2"'$  (1.575 m), weight 125 lb (56.7 kg), SpO2 100 %.  Patient was given IM Haldol overnight for persistent mild agitation and trying to climb out of the bed. 1:1 sitter continued.  Disposition is pending per Psychiatry/Behavioral Medicine team recommendations.     Paulette Blanch, MD 01/20/15 [phone]

## 2015-01-20 NOTE — ED Notes (Signed)
Patient assigned to appropriate care area. Patient oriented to unit/care area: Informed that, for their safety, care areas are designed for safety and monitored by security and staff at all times; and visiting hours explained to patient with no apparent understanding. Patient verbalizes understanding, but given the Pt's mental status RN believes that the Pt does not understand.

## 2015-01-20 NOTE — Consult Note (Signed)
Jill Copeland Face-to-Face Psychiatry Consult   Reason for Consult:  Consult follow-up for this 81 year old woman with schizophrenia and dementia Referring Physician:  Thomasene Lot Patient Identification: Jill Copeland MRN:  [id number] Principal Diagnosis: Schizoaffective disorder, unspecified type [facility]) Diagnosis:   Patient Active Problem List   Diagnosis Date Noted  . Pressure ulcer [L89.90] 01/13/2015  . Multiple falls [R29.6] 01/12/2015  . Overactive bladder [N32.81] 12/30/2014  . Diabetes mellitus without complication ([place]) [G29.5]   . Alzheimer disease [G30.9]   . Schizo affective schizophrenia ([place]) [F25.0]   . Recurrent pneumonia [J18.9]   . Dysphagia causing pulmonary aspiration with swallowing [R13.19]   . Pneumococcal pneumonia ([place]) [J13]   . Arrhythmia [I49.9]   . Esophageal stricture [K22.2]   . Hypothyroidism [E03.9]   . Lung nodule [R91.1]   . Dementia in chronic schizophrenia ([place]) [F20.5] 08/12/2014  . Social discord [Z65.8] 08/12/2014  . Family conflict [M84.1] 32/44/0102  . Schizoaffective disorder, unspecified type ([place]) [F25.9]     Total Time spent with patient: 30 minutes  Subjective:   Jill Copeland is a 81 y.o. female patient admitted with "I want to go home".  HPI:  Follow-up for this 81 year old woman with a history of schizophrenia and dementia. She had evidently been living at [facility] manner recently but was brought here because she had become agitated. I'm unclear about the degree of agitation. Since being here in the emergency room she has been frequently trying to get up and stand but has not been violent aggressive or threatening. Patient does say that they were mean to her at [facility] manner and treated her badly and that she does not want to go back there. She is not making any other bizarre statements. Evidently daughter had felt that the patient needed some level of care different than what she is currently getting.  Social history: Patient had  recently been living with her daughter who had been increasingly having trouble taking care of her at home. More recently patient has been at [facility] manner. Doesn't sound like there is other family actively involved in treatment.  Medical history: Patient has hypothyroidism and history of recurrent bladder problems possibly heart arrhythmia possible diabetes. She has been diagnosed with dementia and has a more distant history of schizoaffective disorder.  Family history: The patient is unable to answer any questions about family history.  Substance abuse history: There is no known substance abuse history problem    Past Psychiatric History: Patient has a history of a diagnosis of schizophrenia and has had more than one admission to psychiatric hospitals in decades gone by. Evidently has had some lengthy hospitalizations in the past. As she has gotten more demented her behavior has been even more difficult to control. Does have a history of getting somewhat physically aggressive when she is agitated. No known history of suicidality. Multiple medications have been tried. It looks like recently it's documented that she was taking Zyprexa 20 mg at night  Risk to Self: Suicidal Ideation: No Suicidal Intent: No Is patient at risk for suicide?: No Suicidal Plan?: No Access to Means: No What has been your use of drugs/alcohol within the last 12 months?: N/A How many times?: 0 Other Self Harm Risks: N/A Triggers for Past Attempts: None known Intentional Self Injurious Behavior: None Risk to Others: Homicidal Ideation: No Thoughts of Harm to Others: No Current Homicidal Intent: No Current Homicidal Plan: No Access to Homicidal Means: No Identified Victim: N/A History of harm to others?:  No Assessment of Violence: None Noted Violent Behavior Description: N/A Does patient have access to weapons?: No Criminal Charges Pending?: No Does patient have a court date: No Prior Inpatient Therapy:  Prior Inpatient Therapy: No Prior Therapy Dates: N/a Prior Therapy Facilty/Provider(s): N/A Reason for Treatment: N/A Prior Outpatient Therapy: Prior Outpatient Therapy: No Prior Therapy Dates: N/A Prior Therapy Facilty/Provider(s): N/a Reason for Treatment: N/a Does patient have an ACCT team?: No Does patient have Intensive In-House Services?  : No Does patient have [facility] services? : No Does patient have P4CC services?: No  Past Medical History:  Past Medical History  Diagnosis Date  . Diabetes mellitus without complication ([place])   . Alzheimer disease   . Schizo affective schizophrenia ([place])   . Recurrent pneumonia   . Dysphagia causing pulmonary aspiration with swallowing   . Pneumococcal pneumonia ([place])   . Bacteremia     several timesf  . Recurrent UTI   . Stroke (cerebrum) ([place])   . Arrhythmia     with syncope s/p PPM  . GERD (gastroesophageal reflux disease)   . Esophageal stricture   . Hiatal hernia     s/p repair with fistula  . Arthritis   . Hypothyroidism     Past Surgical History  Procedure Laterality Date  . Hernia repair    . Pacemaker insertion    . Jejunostomy feeding tube     Family History:  Family History  Problem Relation Age of Onset  . Thyroid cancer Grandchild   . Thyroid nodules Daughter   . Thyroid cancer Daughter   . Dementia Mother   . Schizophrenia Maternal Aunt    Family Psychiatric  History: There is no information clearly available but I don't believe there is any past history of family brought mental Copeland problems Social History:  History  Alcohol Use No     History  Drug Use No    Social History   Social History  . Marital Status: Widowed    Spouse Name: N/A  . Number of Children: N/A  . Years of Education: N/A   Social History Main Topics  . Smoking status: Never Smoker   . Smokeless tobacco: None  . Alcohol Use: No  . Drug Use: No  . Sexual Activity: Not Asked   Other Topics Concern  . None   Social  History Narrative   Additional Social History:    History of alcohol / drug use?: No history of alcohol / drug abuse                     Allergies:   Allergies  Allergen Reactions  . Penicillins Other (See Comments)    Unknown reaction - noted on MAR. Has patient had a PCN reaction causing immediate rash, facial/tongue/throat swelling, SOB or lightheadedness with hypotension: No Has patient had a PCN reaction causing severe rash involving mucus membranes or skin necrosis: No Has patient had a PCN reaction that required hospitalization No Has patient had a PCN reaction occurring within the last 10 years: No If all of the above answers are "NO", then may p    Labs: No results found for this or any previous visit (from the past 48 hour(s)).  Current Facility-Administered Medications  Medication Dose Route Frequency Provider Last Rate Last Dose  . cloNIDine (CATAPRES) tablet 0.1 mg  0.1 mg Oral TID Paulette Blanch, MD   0.1 mg at 01/20/15 1536  . donepezil (ARICEPT) tablet 5 mg  5 mg  Oral QHS Paulette Blanch, MD   5 mg at 01/18/15 2149  . levothyroxine (SYNTHROID, LEVOTHROID) tablet 75 mcg  75 mcg Oral QAC breakfast Paulette Blanch, MD   75 mcg at 01/19/15 0830  . OLANZapine (ZYPREXA) tablet 20 mg  20 mg Oral QHS Gonzella Lex, MD      . oxybutynin (DITROPAN) tablet 5 mg  5 mg Oral TID Paulette Blanch, MD   5 mg at 01/20/15 1536  . pantoprazole (PROTONIX) EC tablet 40 mg  40 mg Oral Daily Paulette Blanch, MD   40 mg at 01/20/15 1134  . saccharomyces boulardii (FLORASTOR) capsule 250 mg  250 mg Oral BID Paulette Blanch, MD   250 mg at 01/20/15 1133  . sulfamethoxazole-trimethoprim (BACTRIM DS,SEPTRA DS) 800-160 MG per tablet 1 tablet  1 tablet Oral Q12H Paulette Blanch, MD   1 tablet at 01/20/15 1133   Current Outpatient Prescriptions  Medication Sig Dispense Refill  . benzonatate (TESSALON) 100 MG capsule Take 100 mg by mouth 3 (three) times daily as needed for cough.    . cloNIDine (CATAPRES) 0.1 MG  tablet Take 0.1 mg by mouth 3 (three) times daily.    . divalproex (DEPAKOTE ER) 500 MG 24 hr tablet Take 500 mg by mouth at bedtime.    . docusate sodium (COLACE) 100 MG capsule Take 100 mg by mouth 2 (two) times daily.    Marland Kitchen donepezil (ARICEPT) 5 MG tablet Take 5 mg by mouth at bedtime.    . haloperidol (HALDOL) 1 MG tablet Take 1 mg by mouth every 6 (six) hours as needed for agitation.    . haloperidol (HALDOL) 2 MG/ML solution Take 1 mg by mouth daily as needed for agitation.    Marland Kitchen levothyroxine (SYNTHROID, LEVOTHROID) 75 MCG tablet Take 75 mcg by mouth daily before breakfast.    . LORazepam (ATIVAN) 0.5 MG tablet Take 1 tablet (0.5 mg total) by mouth every 6 (six) hours as needed for anxiety. 30 tablet 0  . LORazepam (ATIVAN) 2 MG/ML concentrated solution Take 1 mg by mouth daily as needed for anxiety.    Marland Kitchen OLANZapine (ZYPREXA) 20 MG tablet Take 20 mg by mouth at bedtime.    Marland Kitchen oxybutynin (DITROPAN) 5 MG tablet Take 1 tablet (5 mg total) by mouth 3 (three) times daily. 60 tablet 0  . pantoprazole (PROTONIX) 40 MG tablet Take 40 mg by mouth daily.    . rivastigmine (EXELON) 4.6 mg/24hr Place 4.6 mg onto the skin daily.    Marland Kitchen saccharomyces boulardii (FLORASTOR) 250 MG capsule Take 1 capsule (250 mg total) by mouth 2 (two) times daily. 30 capsule 0  . sodium chloride 1 G tablet Take 1 g by mouth 3 (three) times daily.    . traMADol (ULTRAM) 50 MG tablet Take 50 mg by mouth 2 (two) times daily as needed for moderate pain.    . traMADol (ULTRAM) 50 MG tablet Take 1 tablet (50 mg total) by mouth 2 (two) times daily as needed for moderate pain. 30 tablet 0  . traZODone (DESYREL) 50 MG tablet Take 50 mg by mouth at bedtime. For agitation      Musculoskeletal: Strength & Muscle Tone: decreased Gait & Station: unsteady Patient leans: N/A  Psychiatric Specialty Exam: Review of Systems  Unable to perform ROS: mental acuity    Blood pressure 178/89, pulse 83, temperature 98.7 F (37.1 C),  temperature source Oral, resp. rate 24, height '5\' 2"'$  (1.575 m), weight  56.7 kg (125 lb), SpO2 97 %.Body mass index is 22.86 kg/(m^2).  General Appearance: Disheveled  Eye Contact::  Minimal  Speech:  Garbled and Slow  Volume:  Decreased  Mood:  Anxious  Affect:  Restricted  Thought Process:  Loose  Orientation:  Other:  Patient seems to have only a vague idea of her current situation although she knows she is at a hospital.  Thought Content:  Delusions  Suicidal Thoughts:  No  Homicidal Thoughts:  No  Memory:  Full memory testing not redone. Patient clearly has problems with short-term memory.  Judgement:  Impaired  Insight:  Lacking  Psychomotor Activity:  Restlessness  Concentration:  Poor  Recall:  Poor  Fund of Knowledge:Poor  Language: Poor  Akathisia:  No  Handed:  Right  AIMS (if indicated):     Assets:  Physical Copeland Social Support  ADL's:  Impaired  Cognition: Impaired,  Moderate  Sleep:      Treatment Plan Summary: Plan Patient has been judged by the psychiatrist over the weekend to not require psychiatric hospitalization and I would agree with that. That does not seem to be an acute problem but rather a chronic problem with dementia and problems with adjusting to her new social situation. She is not violent or aggressive but does require close management because of her worsening dementia. Patient is to be referred to [facility] manner again and if they refused to another facility to care for a person with her degree of dementia. I have increased the dose of Zyprexa back to the previously prescribed 20 mg a day.  Disposition: No evidence of imminent risk to self or others at present.   Patient does not meet criteria for psychiatric inpatient admission.  Jill Copeland 01/20/2015 4:05 PM

## 2015-01-21 DIAGNOSIS — F911 Conduct disorder, childhood-onset type: Secondary | ICD-10-CM | POA: Diagnosis not present

## 2015-01-21 NOTE — ED Provider Notes (Signed)
-----------------------------------------   6:41 AM on 01/21/2015 -----------------------------------------   Blood pressure 94/46, pulse 77, temperature 98.1 F (36.7 C), temperature source Oral, resp. rate 14, height '5\' 2"'$  (1.575 m), weight 125 lb (56.7 kg), SpO2 95 %.  The patient had no acute events since last update.  Calm and cooperative at this time.    Per psych the plan is for the patient to be discharged but at this time we are unsure if the patient will be accepted back to her nursing facility or if she will be discharged to home. We'll contact social work to help determine the patient's placement.     Loney Hering, MD 01/21/15 [phone]

## 2015-01-21 NOTE — ED Notes (Signed)
Pt given bath, clothing changed, skin checked. Pt with sacral pressure dressing intact to sacral area, pt with coband to left lower arm and bandaid to top of right hand. Pt also with dry skin bath to lower left leg.

## 2015-01-21 NOTE — ED Notes (Signed)
Pt resting quietly in room, 1:1 sitter at bedside.

## 2015-01-21 NOTE — ED Notes (Signed)
Pt 2200 medications held at this time, patient sleeping soundly.  Pt has hx of trouble sleeping and continuously trying to get out of bed.

## 2015-01-21 NOTE — ED Notes (Signed)
Pt up to restroom with no distress noted. Sitter with pt for assistance as needed.

## 2015-01-21 NOTE — BHH Counselor (Signed)
Writer spoke to [facility] [place] about Pt's disposition.  SW is going to follow up with [facility] today.   Deland Pretty, LPCA Therapeutic Triage Specialist

## 2015-01-22 DIAGNOSIS — F911 Conduct disorder, childhood-onset type: Secondary | ICD-10-CM | POA: Diagnosis not present

## 2015-01-22 NOTE — ED Notes (Signed)
BEHAVIORAL HEALTH ROUNDING Patient sleeping: Yes.   Patient alert and oriented: sleeping Behavior appropriate: Yes.  ; If no, describe: sleeping Nutrition and fluids offered: sleeping Toileting and hygiene offered: sleeping Sitter present: no Law enforcement present: Yes  and [place]

## 2015-01-22 NOTE — ED Notes (Signed)
Received report from andrea b rn, care assumed.  Pt resting at this time, sitter remains at bedside.

## 2015-01-22 NOTE — ED Provider Notes (Signed)
-----------------------------------------   7:10 AM on 01/22/2015 -----------------------------------------   Blood pressure 130/65, pulse 68, temperature 98 F (36.7 C), temperature source Oral, resp. rate 17, height '5\' 2"'$  (1.575 m), weight 125 lb (56.7 kg), SpO2 68 %.  The patient had no acute events since last update.  Calm and cooperative at this time.  Currently awaiting social work for appropriate placement either to the patient's nursing home or to her residence.  Harvest Dark, MD 01/22/15 [phone]

## 2015-01-22 NOTE — ED Notes (Signed)
ED BHU [place] Is the patient under IVC or is there intent for IVC: Yes.   Is the patient medically cleared: Yes.   Is there vacancy in the ED BHU: Yes.   Is the population mix appropriate for patient: No. Is the patient awaiting placement in inpatient or outpatient setting: Yes.   Has the patient had a psychiatric consult: Yes.   Survey of unit performed for contraband, proper placement and condition of furniture, tampering with fixtures in bathroom, shower, and each patient room: Yes.  ; Findings:  APPEARANCE/BEHAVIOR calm NEURO ASSESSMENT Orientation: person Hallucinations: No.None noted (Hallucinations) Speech: Normal Gait: unsteady RESPIRATORY ASSESSMENT Normal expansion.  Clear to auscultation.  No rales, rhonchi, or wheezing. CARDIOVASCULAR ASSESSMENT regular rate and rhythm, S1, S2 normal, no murmur, click, rub or gallop GASTROINTESTINAL ASSESSMENT soft, nontender, BS WNL, no r/g EXTREMITIES normal strength, tone, and muscle mass, Strength, tone and muscle mass are abnormal. PLAN OF CARE Provide calm/safe environment. Vital signs assessed twice daily. ED BHU Assessment once each 12-hour shift. Collaborate with intake RN daily or as condition indicates. Assure the ED provider has rounded once each shift. Provide and encourage hygiene. Provide redirection as needed. Assess for escalating behavior; address immediately and inform ED provider.  Assess family dynamic and appropriateness for visitation as needed: Yes.  ; If necessary, describe findings:  Educate the patient/family about BHU procedures/visitation: Yes.  ; If necessary, describe findings:

## 2015-01-22 NOTE — ED Notes (Signed)

## 2015-01-22 NOTE — ED Notes (Signed)
Colen Darling, daughter, called to check on patient.  She also informed this nurse that she talked to [place] at [facility] and according to her they have a bed available and are willing to accept patient.  Phone number to facility is [phone].  Hannah, TTS, informed of this information.

## 2015-01-22 NOTE — ED Notes (Signed)
BEHAVIORAL HEALTH ROUNDING Patient sleeping: Yes.   Patient alert and oriented: no Behavior appropriate: No.; If no, describe: pt confused Nutrition and fluids offered: Yes  Toileting and hygiene offered: Yes  Sitter present: yes Law enforcement present: Yes

## 2015-01-22 NOTE — ED Notes (Signed)
BEHAVIORAL HEALTH ROUNDING Patient sleeping: No. Patient alert and oriented: no Behavior appropriate: No.; If no, describe: pt attempting to get out of bed.  Nutrition and fluids offered: Yes  Toileting and hygiene offered: Yes  Sitter present: yes Law enforcement present: Yes

## 2015-01-22 NOTE — ED Notes (Signed)
BEHAVIORAL HEALTH ROUNDING Patient sleeping: No. Patient alert and oriented: no Behavior appropriate: No.; If no, describe: pt trying to hit sitter  Nutrition and fluids offered: Yes  Toileting and hygiene offered: Yes  Sitter present: yes Law enforcement present: Yes

## 2015-01-22 NOTE — ED Notes (Signed)
BEHAVIORAL HEALTH ROUNDING Patient sleeping: No. Patient alert and oriented: no Behavior appropriate: No.; If no, describe: pt confused, trying to get up Nutrition and fluids offered: Yes  Toileting and hygiene offered: Yes  Sitter present: yes Law enforcement present: Yes

## 2015-01-22 NOTE — ED Notes (Signed)
BEHAVIORAL HEALTH ROUNDING Patient sleeping: No. Patient alert and oriented: no Behavior appropriate: Yes.  ; If no, describe:  Nutrition and fluids offered: No Toileting and hygiene offered: Yes  Sitter present: yes Law enforcement present: Yes

## 2015-01-22 NOTE — ED Notes (Signed)
Patient assigned to appropriate care area. Patient oriented to unit/care area: Informed that, for their safety, care areas are designed for safety and monitored by security cameras at all times. Pt resting at this time.

## 2015-01-23 DIAGNOSIS — F911 Conduct disorder, childhood-onset type: Secondary | ICD-10-CM | POA: Diagnosis not present

## 2015-01-23 DIAGNOSIS — F259 Schizoaffective disorder, unspecified: Secondary | ICD-10-CM | POA: Diagnosis not present

## 2015-01-23 NOTE — ED Notes (Signed)
Resumed care from [facility]; pt has 1:1 sitter Solmon Ice) for safety. Pt in her room resting.

## 2015-01-23 NOTE — ED Notes (Signed)
BEHAVIORAL HEALTH ROUNDING Patient sleeping: Yes.   Patient alert and oriented: not applicable Behavior appropriate: Yes.    Nutrition and fluids offered: No Toileting and hygiene offered: No Sitter present: q15 minute observations Law enforcement present: Yes [employer] 

## 2015-01-23 NOTE — ED Notes (Signed)
Pt in room. Safety sitter at bedside. No complaints or concerns voiced at this time. No abnormal behavior noted at this time. Will continue to monitor with q15 min checks. ODS officer in area.

## 2015-01-23 NOTE — ED Notes (Signed)
Report received from [facility].   Pt in room. Safety Sitter at bedside. No complaints or concerns voiced at this time. No abnormal behavior noted at this time. Will continue to monitor with q15 min checks. ODS officer in area.

## 2015-01-23 NOTE — ED Notes (Signed)
BEHAVIORAL HEALTH ROUNDING Patient sleeping: No. Patient alert and oriented: yes Behavior appropriate: Yes.  ; If no, describe:  Nutrition and fluids offered: Yes  Toileting and hygiene offered: Yes  Sitter present: yes Law enforcement present: Yes  

## 2015-01-23 NOTE — Consult Note (Signed)
  Psychiatry: This is an 81 year old woman with a past history of schizophrenia or schizoaffective disorder who now is also suffering from dementia probably largely Alzheimer's related. She was brought to our hospital because of reported agitation at the facility where she was staying. She has been continued on medication including her psychiatric medicine in the emergency room.  On reevaluation today the patient was awake alert and was eating and able to feed herself. She was able to engage in some conversation although her speech is slow and decreased in amount. Patient did not have a specific complaints except wanting to be out of the hospital. She denied any suicidal thoughts and denied any thoughts of being aggressive.  Vital signs are stable. No obvious new medical problems. Compliant with medicine.  During her few days in the emergency room she has had a couple of occasions of agitation but in the last day or more has been much more calm and appropriate. Her insight is still limited.  I have discussed the case with the psychiatry team in the emergency room. I think that if the patient is taking her medicine as she is right now that discharging her back to her prior living arrangement or finding another appropriate assisted living or group home type environment would be appropriate. If that is not possible we could consider referral to a geriatric psychiatry unit as the patient does have schizophrenia and might benefit from more treatment although honestly I think ultimately placement is probably most appropriate. Because of her chronic mental illness she will probably always be prone to getting more agitated during having more management problems and some other elderly patients.  Case discussed with ER staff including the ER physician and psychiatry intake. No change to diagnosis. We are hoping to find a discharge plan as soon as possible.

## 2015-01-23 NOTE — ED Notes (Signed)
Rep from [facility] in with pt.

## 2015-01-23 NOTE — ED Notes (Signed)
BEHAVIORAL HEALTH ROUNDING Patient sleeping: No. Patient alert and oriented: yes Behavior appropriate: Yes.   Nutrition and fluids offered: Yes  Toileting and hygiene offered: Yes  Sitter present: q15 min observations Law enforcement present: Yes [employer]  ENVIRONMENTAL ASSESSMENT Potentially harmful objects out of patient reach: Yes.   Personal belongings secured: Yes.   Patient dressed in hospital provided attire only: Yes.   Plastic bags out of patient reach: Yes.   Patient care equipment (cords, cables, call bells, lines, and drains) shortened, removed, or accounted for: Yes.   Equipment and supplies removed from bottom of stretcher: Yes.   Potentially toxic materials out of patient reach: Yes.   Sharps container removed or out of patient reach: Yes.  ED BHU [place] Is the patient under IVC or is there intent for IVC: Yes.    Is IVC current? Yes Is the patient medically cleared: Yes.   Is there vacancy in the ED BHU: Yes.   Is the population mix appropriate for patient: No pt high fall risk requiring safety sitter, requires assistance with ADLs..   Is the patient awaiting placement in inpatient or outpatient setting: Yes.   Has the patient had a psychiatric consult: Yes.   Survey of unit performed for contraband, proper placement and condition of furniture, tampering with fixtures in bathroom, shower, and each patient room: Yes.  ; Findings: none APPEARANCE/BEHAVIOR Cooperative,calm NEURO ASSESSMENT Orientation: person Hallucinations: none noted at this time Speech: Normal Gait: shuffling RESPIRATORY ASSESSMENT Breathing Pattern-regular, no respiratory distress noted CARDIOVASCULAR ASSESSMENT Skin color appropriate for age and race GASTROINTESTINAL ASSESSMENT no GI distress noted EXTREMITIES Moves all extremities, no distress noted PLAN OF CARE Provide calm/safe environment. Vital signs assessed twice daily. ED BHU Assessment once each 12-hour  shift. Collaborate with intake RN daily or as condition indicates. Assure the ED provider has rounded once each shift. Provide and encourage hygiene. Provide redirection as needed. Assess for escalating behavior; address immediately and inform ED provider.  Assess family dynamic and appropriateness for visitation as needed: Yes.  Educate the patient/family about BHU procedures/visitation: Yes.

## 2015-01-23 NOTE — ED Notes (Signed)
Pt given breakfast tray

## 2015-01-23 NOTE — ED Notes (Signed)
Pt in room. Safety Sitter at bedside. No complaints or concerns voiced at this time. No abnormal behavior noted at this time. Will continue to monitor with q15 min checks. ODS officer in area.

## 2015-01-23 NOTE — NC FL2 (Addendum)
[place] LEVEL OF CARE SCREENING TOOL     IDENTIFICATION  Patient Name: Jill Copeland Birthdate: 09/19/1931 Sex: female Admission Date (Current Location): 01/17/2015  [place] and [place] Number: Engineering geologist and Address:  [facility], [address], [place], [place] [address]      Provider Number: [id number]  Attending Physician Name and Address:  No att. providers found  Relative Name and Phone Number:       Current Level of Care: SNF Recommended Level of Care: [place] Prior Approval Number:    Date Approved/Denied:   PASRR Number: [id number]  Discharge Plan: SNF    Current Diagnoses: Patient Active Problem List   Diagnosis Date Noted  . Schizoaffective disorder ([place])   . Pressure ulcer 01/13/2015  . Multiple falls 01/12/2015  . Overactive bladder 12/30/2014  . Diabetes mellitus without complication ([place])   . Alzheimer disease   . Schizo affective schizophrenia ([place])   . Recurrent pneumonia   . Dysphagia causing pulmonary aspiration with swallowing   . Pneumococcal pneumonia ([place])   . Arrhythmia   . Esophageal stricture   . Hypothyroidism   . Lung nodule   . Dementia in chronic schizophrenia ([place]) 08/12/2014  . Social discord 08/12/2014  . Family conflict 56/31/4970  . Schizoaffective disorder, unspecified type ([place])     Orientation ACTIVITIES/SOCIAL BLADDER RESPIRATION    Place, Situation, Self  Family supportive Continent    BEHAVIORAL SYMPTOMS/MOOD NEUROLOGICAL BOWEL NUTRITION STATUS  Verbally abusive, Physically abusive, Other (Comment) (can be agressive when sundowning)   Continent Diet  PHYSICIAN VISITS COMMUNICATION OF NEEDS Height & Weight Skin  30 days Verbally   125 lbs. See below         AMBULATORY STATUS RESPIRATION    Assist extensive        Personal Care Assistance Level of Assistance  Dressing, Bathing Bathing Assistance: Maximum assistance   Dressing  Assistance: Maximum assistance      Functional Limitations Info  Sight             SPECIAL CARE FACTORS FREQUENCY  PT (By licensed PT)     PT Frequency: 5             Additional Factors Info  Psychotropic (DNR)               Current Medications (01/23/2015): Current Facility-Administered Medications  Medication Dose Route Frequency Provider Last Rate Last Dose  . cloNIDine (CATAPRES) tablet 0.1 mg  0.1 mg Oral TID Paulette Blanch, MD   0.1 mg at 01/22/15 2127  . donepezil (ARICEPT) tablet 5 mg  5 mg Oral QHS Paulette Blanch, MD   5 mg at 01/22/15 2128  . levothyroxine (SYNTHROID, LEVOTHROID) tablet 75 mcg  75 mcg Oral QAC breakfast Paulette Blanch, MD   75 mcg at 01/23/15 2637  . OLANZapine (ZYPREXA) tablet 20 mg  20 mg Oral QHS Gonzella Lex, MD   20 mg at 01/22/15 2128  . oxybutynin (DITROPAN) tablet 5 mg  5 mg Oral TID Paulette Blanch, MD   5 mg at 01/22/15 2126  . pantoprazole (PROTONIX) EC tablet 40 mg  40 mg Oral Daily Paulette Blanch, MD   40 mg at 01/22/15 1030  . saccharomyces boulardii (FLORASTOR) capsule 250 mg  250 mg Oral BID Paulette Blanch, MD   250 mg at 01/22/15 2125   Current Outpatient Prescriptions  Medication Sig Dispense Refill  . benzonatate (  TESSALON) 100 MG capsule Take 100 mg by mouth 3 (three) times daily as needed for cough.    . cloNIDine (CATAPRES) 0.1 MG tablet Take 0.1 mg by mouth 3 (three) times daily.    . divalproex (DEPAKOTE ER) 500 MG 24 hr tablet Take 500 mg by mouth at bedtime.    . docusate sodium (COLACE) 100 MG capsule Take 100 mg by mouth 2 (two) times daily.    Marland Kitchen donepezil (ARICEPT) 5 MG tablet Take 5 mg by mouth at bedtime.    . haloperidol (HALDOL) 1 MG tablet Take 1 mg by mouth every 6 (six) hours as needed for agitation.    . haloperidol (HALDOL) 2 MG/ML solution Take 1 mg by mouth daily as needed for agitation.    Marland Kitchen levothyroxine (SYNTHROID, LEVOTHROID) 75 MCG tablet Take 75 mcg by mouth daily before breakfast.    . LORazepam (ATIVAN) 0.5 MG  tablet Take 1 tablet (0.5 mg total) by mouth every 6 (six) hours as needed for anxiety. 30 tablet 0  . LORazepam (ATIVAN) 2 MG/ML concentrated solution Take 1 mg by mouth daily as needed for anxiety.    Marland Kitchen OLANZapine (ZYPREXA) 20 MG tablet Take 20 mg by mouth at bedtime.    Marland Kitchen oxybutynin (DITROPAN) 5 MG tablet Take 1 tablet (5 mg total) by mouth 3 (three) times daily. 60 tablet 0  . pantoprazole (PROTONIX) 40 MG tablet Take 40 mg by mouth daily.    . rivastigmine (EXELON) 4.6 mg/24hr Place 4.6 mg onto the skin daily.    Marland Kitchen saccharomyces boulardii (FLORASTOR) 250 MG capsule Take 1 capsule (250 mg total) by mouth 2 (two) times daily. 30 capsule 0  . sodium chloride 1 G tablet Take 1 g by mouth 3 (three) times daily.    . traMADol (ULTRAM) 50 MG tablet Take 50 mg by mouth 2 (two) times daily as needed for moderate pain.    . traMADol (ULTRAM) 50 MG tablet Take 1 tablet (50 mg total) by mouth 2 (two) times daily as needed for moderate pain. 30 tablet 0  . traZODone (DESYREL) 50 MG tablet Take 50 mg by mouth at bedtime. For agitation     Do not use this list as official medication orders. Please verify with discharge summary.  Discharge Medications:   Medication List    ASK your doctor about these medications        benzonatate 100 MG capsule  Commonly known as:  TESSALON  Take 100 mg by mouth 3 (three) times daily as needed for cough.     cloNIDine 0.1 MG tablet  Commonly known as:  CATAPRES  Take 0.1 mg by mouth 3 (three) times daily.     divalproex 500 MG 24 hr tablet  Commonly known as:  DEPAKOTE ER  Take 500 mg by mouth at bedtime.     docusate sodium 100 MG capsule  Commonly known as:  COLACE  Take 100 mg by mouth 2 (two) times daily.     donepezil 5 MG tablet  Commonly known as:  ARICEPT  Take 5 mg by mouth at bedtime.     haloperidol 1 MG tablet  Commonly known as:  HALDOL  Take 1 mg by mouth every 6 (six) hours as needed for agitation.     haloperidol 2 MG/ML solution   Commonly known as:  HALDOL  Take 1 mg by mouth daily as needed for agitation.     levothyroxine 75 MCG tablet  Commonly known as:  SYNTHROID, LEVOTHROID  Take 75 mcg by mouth daily before breakfast.     LORazepam 2 MG/ML concentrated solution  Commonly known as:  ATIVAN  Take 1 mg by mouth daily as needed for anxiety.     LORazepam 0.5 MG tablet  Commonly known as:  ATIVAN  Take 1 tablet (0.5 mg total) by mouth every 6 (six) hours as needed for anxiety.     OLANZapine 20 MG tablet  Commonly known as:  ZYPREXA  Take 20 mg by mouth at bedtime.     oxybutynin 5 MG tablet  Commonly known as:  DITROPAN  Take 1 tablet (5 mg total) by mouth 3 (three) times daily.     pantoprazole 40 MG tablet  Commonly known as:  PROTONIX  Take 40 mg by mouth daily.     rivastigmine 4.6 mg/24hr  Commonly known as:  EXELON  Place 4.6 mg onto the skin daily.     saccharomyces boulardii 250 MG capsule  Commonly known as:  FLORASTOR  Take 1 capsule (250 mg total) by mouth 2 (two) times daily.     sodium chloride 1 G tablet  Take 1 g by mouth 3 (three) times daily.     traMADol 50 MG tablet  Commonly known as:  ULTRAM  Take 50 mg by mouth 2 (two) times daily as needed for moderate pain.     traMADol 50 MG tablet  Commonly known as:  ULTRAM  Take 1 tablet (50 mg total) by mouth 2 (two) times daily as needed for moderate pain.     traZODone 50 MG tablet  Commonly known as:  DESYREL  Take 50 mg by mouth at bedtime. For agitation        Relevant Imaging Results:  Relevant Lab Results:  Recent Labs    Additional Information Ss: [id number] Stage 2 pressure injury to sacrum.  Wound type:Stage 2 sacral pressure injury Full thickness skin tears to bilateral lower extremities and right elbow.  Bruising to upper and lower extremities.  Pressure Ulcer POA: Yes Measurement:Left anterior lower leg 4 cm x 6 cm x 0.2 cm (skin flap partially approximated Right anterior lower leg 2 cm x 4  cm x 0.2 cm (skin flap partially approximated Right elbow 3 scattered nonintact lesions, scabbed, dry and intact. Bruising to bilateral upper and lower extremities.  Resolving staple line to back of head. Staples removed.  Sacrum 3 cm x 3.2 cm x 0.2 cm erythema with 1 cm x 1 cm x 0.1 cm nonintact center.  Wound bed:100% pink and moist Drainage (amount, consistency, odor) Scant serous drainage Periwound:Bruising Dressing procedure/placement/frequency:Cleanse skin tears to bilateral lower legs and right elbow with soap and water. Apply contact layer (Mepitel) to wound bed. Change twice weekly Friday and Tuesday. Cleanse sacrum with soap and water. Apply Allevyn silicone border foam dressing. Change every 3 days and PRN soilage. No disposable briefs, therapeutic linen to skin to promote healing.   Palliative Care to follow at SNF  Alonna Buckler, LCSW

## 2015-01-23 NOTE — ED Notes (Signed)
BEHAVIORAL HEALTH ROUNDING Patient sleeping: Yes.   Patient alert and oriented: na Behavior appropriate: Yes.  ; If no, describe:  Nutrition and fluids offered: No Toileting and hygiene offered: No Sitter present: yes Law enforcement present: Yes

## 2015-01-23 NOTE — ED Provider Notes (Signed)
-----------------------------------------   2:49 AM on 01/23/2015 -----------------------------------------   Blood pressure 119/70, pulse 88, temperature 97.5 F (36.4 C), temperature source Oral, resp. rate 15, height '5\' 2"'$  (1.575 m), weight 125 lb (56.7 kg), SpO2 95 %.  The patient had no acute events since last update.  Calm and cooperative at this time.  Disposition is pending per Psychiatry/Behavioral Medicine team recommendations.   Patient presently sitting up in bed, no distress.  Delman Kitten, MD 01/23/15 [phone]

## 2015-01-23 NOTE — ED Notes (Signed)
Pt took medications with water; no applesauce requested or given.

## 2015-01-24 DIAGNOSIS — F911 Conduct disorder, childhood-onset type: Secondary | ICD-10-CM | POA: Diagnosis not present

## 2015-01-24 NOTE — ED Notes (Signed)
BEHAVIORAL HEALTH ROUNDING Patient sleeping: Yes.   Patient alert and oriented: not applicable Behavior appropriate: Yes.    Nutrition and fluids offered: No Toileting and hygiene offered: No Sitter present: q15 minute observations Law enforcement present: Yes [employer] 

## 2015-01-24 NOTE — ED Notes (Signed)
Report to robin at [facility] room 505 bed 2, waiting on ems

## 2015-01-24 NOTE — ED Notes (Signed)
BEHAVIORAL HEALTH ROUNDING Patient sleeping: Yes.   Patient alert and oriented: not applicable Behavior appropriate: Yes.  ; If no, describe:  Nutrition and fluids offered: Yes  Toileting and hygiene offered: Yes  Sitter present: no Law enforcement present: Yes

## 2015-01-24 NOTE — Clinical Social Work Placement (Signed)
   CLINICAL SOCIAL WORK PLACEMENT  NOTE  Date:  01/24/2015  Patient Details  Name: Jill Copeland MRN: [id number] Date of Birth: 03/16/1932  Clinical Social Work is seeking post-discharge placement for this patient at the [place] level of care (*CSW will initial, date and re-position this form in  chart as items are completed):  Yes   Patient/family provided with [place] Work Department's list of facilities offering this level of care within the geographic area requested by the patient (or if unable, by the patient's family).  Yes   Patient/family informed of their freedom to choose among providers that offer the needed level of care, that participate in Medicare, Medicaid or managed care program needed by the patient, have an available bed and are willing to accept the patient.  Yes   Patient/family informed of [facility]'s ownership interest in [facility] and [facility], as well as of the fact that they are under no obligation to receive care at these facilities.  PASRR submitted to EDS on       PASRR number received on       Existing PASRR number confirmed on 01/24/15     FL2 transmitted to all facilities in geographic area requested by pt/family on 01/21/15     FL2 transmitted to all facilities within larger geographic area on       Patient informed that his/her managed care company has contracts with or will negotiate with certain facilities, including the following:        Yes   Patient/family informed of bed offers received.  Patient chooses bed at  [facility])     Physician recommends and patient chooses bed at      Patient to be transferred to  [facility] in [place]) on 01/24/15.  Patient to be transferred to facility by  (EMS)     Patient family notified on 01/23/15 of transfer.  Name of family member notified:   (Daughter Lattie Haw)     PHYSICIAN Please sign FL2     Additional Comment:     _______________________________________________ Maurine Cane, LCSW 01/24/2015, 8:31 AM

## 2015-01-24 NOTE — ED Notes (Signed)
BEHAVIORAL HEALTH ROUNDING Patient sleeping: No. Patient alert and oriented: yes Behavior appropriate: Yes.  ; If no, describe:  Nutrition and fluids offered: Yes  Toileting and hygiene offered: Yes  Sitter present: yes Law enforcement present: Yes  

## 2015-01-24 NOTE — ED Notes (Addendum)
Pt in room. No complaints or concerns voiced at this time. No abnormal behavior noted at this time. Will continue to monitor with q15 min checks. ODS officer in area. 

## 2015-01-24 NOTE — ED Notes (Signed)
Patient assigned to appropriate care area. Patient oriented to unit/care area: Informed that, for their safety, care areas are designed for safety and monitored by security cameras at all times; and visiting hours explained to patient. Patient verbalizes understanding, and verbal contract for safety obtained. 

## 2015-01-24 NOTE — ED Notes (Signed)

## 2015-01-24 NOTE — ED Notes (Signed)
Pt in room. No complaints or concerns voiced at this time. No abnormal behavior noted at this time. Will continue to monitor with q15 min checks. ODS officer in area. 

## 2015-01-24 NOTE — Progress Notes (Signed)
Clinical Social Worker informed by Alethia Berthold, MD that patient is medically ready to discharge from ED.  Patient will discharge to [facility] in [place]. Patient's daughter is in a agreement with plan. (CSW Baxter Flattery has spoken to daughter)  Call to [facility] to confirm that patient's bed is ready. Patient will discharge to [facility] and RN to call for report to [phone] .    Psych MD will discontinue IVC and sign FL2.  Discharge packet will be provided to RN.   RN will call report and patient will discharge to [facility] via EMS.  Casimer Lanius. Latanya Presser, MSW Clinical Social Work Department [phone] 9:16 AM

## 2015-01-24 NOTE — ED Notes (Signed)
Ems here

## 2015-01-24 NOTE — ED Notes (Signed)
BEHAVIORAL HEALTH ROUNDING Patient sleeping: No. Patient alert and oriented: no Behavior appropriate: Yes.  ; If no, describe:  Nutrition and fluids offered: Yes  Toileting and hygiene offered: Yes  Sitter present: yes Law enforcement present: Yes

## 2015-01-24 NOTE — ED Notes (Signed)
BEHAVIORAL HEALTH ROUNDING Patient sleeping: Yes.   Patient alert and oriented: not applicable Behavior appropriate: Yes.  ; If no, describe:  Nutrition and fluids offered: Yes  Toileting and hygiene offered: Yes  Sitter present: yes Law enforcement present: Yes  

## 2015-01-24 NOTE — ED Provider Notes (Signed)
-----------------------------------------   6:46 AM on 01/24/2015 -----------------------------------------   Blood pressure 93/44, pulse 59, temperature 98.1 F (36.7 C), temperature source Oral, resp. rate 16, height '5\' 2"'$  (1.575 m), weight 125 lb (56.7 kg), SpO2 97 %.  The patient had no acute events since last update.  Calm and cooperative at this time.  Disposition is pending per Psychiatry/Behavioral Medicine team recommendations.     Paulette Blanch, MD 01/24/15 [phone]

## 2015-03-27 ENCOUNTER — Other Ambulatory Visit: Payer: Self-pay | Admitting: Family Medicine

## 2015-03-27 DIAGNOSIS — R531 Weakness: Secondary | ICD-10-CM

## 2015-03-27 DIAGNOSIS — R4182 Altered mental status, unspecified: Secondary | ICD-10-CM

## 2015-04-03 ENCOUNTER — Ambulatory Visit
Admission: RE | Admit: 2015-04-03 | Discharge: 2015-04-03 | Disposition: A | Discharge status: Home / Self Care | Payer: Medicare Other | Source: Ambulatory Visit | Attending: Family Medicine | Admitting: Family Medicine

## 2015-04-03 DIAGNOSIS — R4182 Altered mental status, unspecified: Secondary | ICD-10-CM | POA: Diagnosis present

## 2015-04-03 DIAGNOSIS — R531 Weakness: Secondary | ICD-10-CM

## 2015-12-02 ENCOUNTER — Emergency Department: Payer: Medicare Other

## 2015-12-02 ENCOUNTER — Encounter: Payer: Self-pay | Admitting: Emergency Medicine

## 2015-12-02 ENCOUNTER — Emergency Department
Admission: EM | Admit: 2015-12-02 | Discharge: 2015-12-02 | Disposition: A | Discharge status: Home / Self Care | Payer: Medicare Other | Attending: Emergency Medicine | Admitting: Emergency Medicine

## 2015-12-02 DIAGNOSIS — E039 Hypothyroidism, unspecified: Secondary | ICD-10-CM | POA: Diagnosis not present

## 2015-12-02 DIAGNOSIS — R4182 Altered mental status, unspecified: Secondary | ICD-10-CM | POA: Diagnosis present

## 2015-12-02 DIAGNOSIS — S0083XA Contusion of other part of head, initial encounter: Secondary | ICD-10-CM | POA: Insufficient documentation

## 2015-12-02 DIAGNOSIS — S60222A Contusion of left hand, initial encounter: Secondary | ICD-10-CM | POA: Diagnosis not present

## 2015-12-02 DIAGNOSIS — Y929 Unspecified place or not applicable: Secondary | ICD-10-CM | POA: Insufficient documentation

## 2015-12-02 DIAGNOSIS — M955 Acquired deformity of pelvis: Secondary | ICD-10-CM | POA: Insufficient documentation

## 2015-12-02 DIAGNOSIS — Y999 Unspecified external cause status: Secondary | ICD-10-CM | POA: Diagnosis not present

## 2015-12-02 DIAGNOSIS — Y939 Activity, unspecified: Secondary | ICD-10-CM | POA: Insufficient documentation

## 2015-12-02 DIAGNOSIS — G309 Alzheimer's disease, unspecified: Secondary | ICD-10-CM | POA: Insufficient documentation

## 2015-12-02 DIAGNOSIS — W19XXXA Unspecified fall, initial encounter: Secondary | ICD-10-CM | POA: Diagnosis not present

## 2015-12-02 DIAGNOSIS — Z79899 Other long term (current) drug therapy: Secondary | ICD-10-CM | POA: Insufficient documentation

## 2015-12-02 DIAGNOSIS — E119 Type 2 diabetes mellitus without complications: Secondary | ICD-10-CM | POA: Diagnosis not present

## 2015-12-02 LAB — CBC WITH DIFFERENTIAL/PLATELET
BASOS PCT: 0 %
Basophils Absolute: 0 10*3/uL (ref 0–0.1)
Eosinophils Absolute: 0.1 10*3/uL (ref 0–0.7)
Eosinophils Relative: 2 %
HEMATOCRIT: 38.5 % (ref 35.0–47.0)
HEMOGLOBIN: 13.2 g/dL (ref 12.0–16.0)
LYMPHS ABS: 1.6 10*3/uL (ref 1.0–3.6)
Lymphocytes Relative: 17 %
MCH: 30.2 pg (ref 26.0–34.0)
MCHC: 34.3 g/dL (ref 32.0–36.0)
MCV: 88.1 fL (ref 80.0–100.0)
MONO ABS: 0.6 10*3/uL (ref 0.2–0.9)
Monocytes Relative: 7 %
NEUTROS ABS: 7 10*3/uL — AB (ref 1.4–6.5)
NEUTROS PCT: 74 %
Platelets: 147 10*3/uL — ABNORMAL LOW (ref 150–440)
RBC: 4.37 MIL/uL (ref 3.80–5.20)
RDW: 13.3 % (ref 11.5–14.5)
WBC: 9.4 10*3/uL (ref 3.6–11.0)

## 2015-12-02 LAB — COMPREHENSIVE METABOLIC PANEL
ALK PHOS: 76 U/L (ref 38–126)
ALT: 17 U/L (ref 14–54)
ANION GAP: 6 (ref 5–15)
AST: 23 U/L (ref 15–41)
Albumin: 3.7 g/dL (ref 3.5–5.0)
BILIRUBIN TOTAL: 0.9 mg/dL (ref 0.3–1.2)
BUN: 15 mg/dL (ref 6–20)
CALCIUM: 8.8 mg/dL — AB (ref 8.9–10.3)
CO2: 30 mmol/L (ref 22–32)
CREATININE: 0.84 mg/dL (ref 0.44–1.00)
Chloride: 102 mmol/L (ref 101–111)
Glucose, Bld: 98 mg/dL (ref 65–99)
Potassium: 3.7 mmol/L (ref 3.5–5.1)
Sodium: 138 mmol/L (ref 135–145)
TOTAL PROTEIN: 7 g/dL (ref 6.5–8.1)

## 2015-12-02 LAB — URINALYSIS COMPLETE WITH MICROSCOPIC (ARMC ONLY)
BACTERIA UA: NONE SEEN
BILIRUBIN URINE: NEGATIVE
GLUCOSE, UA: NEGATIVE mg/dL
Hgb urine dipstick: NEGATIVE
NITRITE: NEGATIVE
Protein, ur: NEGATIVE mg/dL
SPECIFIC GRAVITY, URINE: 1.012 (ref 1.005–1.030)
pH: 7 (ref 5.0–8.0)

## 2015-12-02 LAB — TROPONIN I
TROPONIN I: 0.03 ng/mL — AB (ref <0.03)
Troponin I: 0.03 ng/mL (ref <0.03)

## 2015-12-02 LAB — LIPASE, BLOOD: Lipase: 13 U/L (ref 11–51)

## 2015-12-02 NOTE — ED Provider Notes (Signed)
[facility] Emergency Department Provider Note   ____________________________________________   First MD Initiated Contact with Patient 12/02/15 1108     (approximate)  I have reviewed the triage vital signs and the nursing notes.   HISTORY  Chief Complaint Fall   HPI Jill Copeland is a 82 y.o. female with a history of Alzheimer's dementia as well as pneumonia who is presenting to the emergency department today with altered mental status. She presents here with her daughter who says that she had a fall last week. She is unsure exactly when the patient fell last week and was told that the patient "threw herself to the ground." The daughter also says that the patient had an acute change of mental status today and was unable to walk. She says that she is also very sedated. The daughter is suspecting that the sedation is from multiple new medications. However, she says that she has also had multiple UTIs in the past altering her mental status. The daughters also and the patient was congested last week and has a history of pneumonia. Is concerned about swelling and ecchymosis to the left hand as well as periorbital ecchymosis over the left eye. The patient at this time has no complaints but is also altered.  Daughter reports that the patient had a tetanus shot last year. Past Medical History:  Diagnosis Date  . Alzheimer disease   . Arrhythmia    with syncope s/p PPM  . Arthritis   . Bacteremia    several timesf  . Diabetes mellitus without complication ([place])   . Dysphagia causing pulmonary aspiration with swallowing   . Esophageal stricture   . GERD (gastroesophageal reflux disease)   . Hiatal hernia    s/p repair with fistula  . Hypothyroidism   . Pneumococcal pneumonia ([place])   . Recurrent pneumonia   . Recurrent UTI   . Schizo affective schizophrenia ([place])   . Stroke (cerebrum) [facility])     Patient Active Problem List   Diagnosis Date Noted  .  Schizoaffective disorder ([place])   . Pressure ulcer 01/13/2015  . Multiple falls 01/12/2015  . Overactive bladder 12/30/2014  . Diabetes mellitus without complication ([place])   . Alzheimer disease   . Schizo affective schizophrenia ([place])   . Recurrent pneumonia   . Dysphagia causing pulmonary aspiration with swallowing   . Pneumococcal pneumonia ([place])   . Arrhythmia   . Esophageal stricture   . Hypothyroidism   . Lung nodule   . Dementia in chronic schizophrenia ([place]) 08/12/2014  . Social discord 08/12/2014  . Family conflict [id number]/5621  . Schizoaffective disorder, unspecified type [facility])     Past Surgical History:  Procedure Laterality Date  . HERNIA REPAIR    . JEJUNOSTOMY FEEDING TUBE    . PACEMAKER INSERTION      Prior to Admission medications   Medication Sig Start Date End Date Taking? Authorizing Provider  benzonatate (TESSALON) 100 MG capsule Take 100 mg by mouth 3 (three) times daily as needed for cough.    Historical Provider, MD  cloNIDine (CATAPRES) 0.1 MG tablet Take 0.1 mg by mouth 3 (three) times daily.    Historical Provider, MD  divalproex (DEPAKOTE ER) 500 MG 24 hr tablet Take 500 mg by mouth at bedtime.    Historical Provider, MD  docusate sodium (COLACE) 100 MG capsule Take 100 mg by mouth 2 (two) times daily.    Historical Provider, MD  donepezil (ARICEPT) 5 MG tablet Take 5  mg by mouth at bedtime.    Historical Provider, MD  haloperidol (HALDOL) 1 MG tablet Take 1 mg by mouth every 6 (six) hours as needed for agitation.    Historical Provider, MD  haloperidol (HALDOL) 2 MG/ML solution Take 1 mg by mouth daily as needed for agitation.    Historical Provider, MD  levothyroxine (SYNTHROID, LEVOTHROID) 75 MCG tablet Take 75 mcg by mouth daily before breakfast.    Historical Provider, MD  LORazepam (ATIVAN) 0.5 MG tablet Take 1 tablet (0.5 mg total) by mouth every 6 (six) hours as needed for anxiety. 01/14/15   [name], MD  LORazepam (ATIVAN) 2 MG/ML  concentrated solution Take 1 mg by mouth daily as needed for anxiety.    Historical Provider, MD  OLANZapine (ZYPREXA) 20 MG tablet Take 20 mg by mouth at bedtime.    Historical Provider, MD  oxybutynin (DITROPAN) 5 MG tablet Take 1 tablet (5 mg total) by mouth 3 (three) times daily. 01/01/15   Hosie Poisson, MD  pantoprazole (PROTONIX) 40 MG tablet Take 40 mg by mouth daily.    Historical Provider, MD  rivastigmine (EXELON) 4.6 mg/24hr Place 4.6 mg onto the skin daily.    Historical Provider, MD  saccharomyces boulardii (FLORASTOR) 250 MG capsule Take 1 capsule (250 mg total) by mouth 2 (two) times daily. 01/01/15   Hosie Poisson, MD  sodium chloride 1 G tablet Take 1 g by mouth 3 (three) times daily.    Historical Provider, MD  traMADol (ULTRAM) 50 MG tablet Take 50 mg by mouth 2 (two) times daily as needed for moderate pain.    Historical Provider, MD  traMADol (ULTRAM) 50 MG tablet Take 1 tablet (50 mg total) by mouth 2 (two) times daily as needed for moderate pain. 01/14/15   [name], MD  traZODone (DESYREL) 50 MG tablet Take 50 mg by mouth at bedtime. For agitation    Historical Provider, MD    Allergies Penicillins  Family History  Problem Relation Age of Onset  . Thyroid cancer Grandchild   . Thyroid nodules Daughter   . Thyroid cancer Daughter   . Dementia Mother   . Schizophrenia Maternal Aunt     Social History Social History  Substance Use Topics  . Smoking status: Never Smoker  . Smokeless tobacco: Never Used  . Alcohol use No    Review of Systems Constitutional: No fever/chills Eyes: No visual changes. ENT: No sore throat. Cardiovascular: Denies chest pain. Respiratory: Denies shortness of breath. Gastrointestinal: No abdominal pain.  No nausea, no vomiting.  No diarrhea.  No constipation. Genitourinary: Negative for dysuria. Musculoskeletal: Negative for back pain. Skin: Negative for rash. Neurological: Negative for headaches, focal weakness or  numbness.  10-point ROS otherwise negative.However, confounded by patient's decreased mental status. She is able to tell me her name as well as her location.  ____________________________________________   PHYSICAL EXAM:  VITAL SIGNS: ED Triage Vitals  Enc Vitals Group     BP 12/02/15 1059 (!) 117/54     Pulse Rate 12/02/15 1059 (!) 53     Resp 12/02/15 1059 20     Temp 12/02/15 1059 98.9 F (37.2 C)     Temp Source 12/02/15 1059 Oral     SpO2 12/02/15 1059 94 %     Weight 12/02/15 1102 118 lb (53.5 kg)     Height 12/02/15 1102 '5\' 6"'$  (1.676 m)     Head Circumference --      Peak Flow --  Pain Score --      Pain Loc --      Pain Edu? --      Excl. in [place]? --     Constitutional: Alert and orientedTo name and place.  in no acute distress. Eyes closed when I'm talking to the daughter. However, the patient is arousable to light touch. Eyes: Conjunctivae are normal. PERRL. EOMI. Head: Left periorbital ecchymosis without swelling or deformity. Nose: No congestion/rhinnorhea. Mouth/Throat: Mucous membranes are moist.  Oropharynx non-erythematous. Neck: No stridor.  No tenderness to the midline C-spine. No deformity or step-off. Cardiovascular: Bradycardia Cardiac, regular rhythm. Grossly normal heart sounds.   Respiratory: Normal respiratory effort.  No retractions. Lungs CTAB. Gastrointestinal: Soft and nontender. No distention.  Musculoskeletal: No lower extremity tenderness nor edema.  No joint effusions.  Left hand with ecchymosis over the dorsum. No swelling or deformity. No focal tenderness. Able to passively range the fingers as well as the wrist without any outward signs of pain from the patient. Able to range the left elbow fully. There is no effusion to the left elbow. No tenderness to palpation. No ecchymosis about the left elbow. Small skin tear about 1-2 cm over the right anterior shin. There is no active bleeding. There is a mild amount of small ecchymosis. No pus,  induration or erythema. Neurologic:  Normal speech and language. No gross focal neurologic deficits are appreciated.  instability. Skin:  Skin is warm, dry. No rash noted.   ____________________________________________   LABS (all labs ordered are listed, but only abnormal results are displayed)  Labs Reviewed  TROPONIN I - Abnormal; Notable for the following:       Result Value   Troponin I 0.03 (*)    All other components within normal limits  CBC WITH DIFFERENTIAL/PLATELET - Abnormal; Notable for the following:    Platelets 147 (*)    Neutro Abs 7.0 (*)    All other components within normal limits  COMPREHENSIVE METABOLIC PANEL - Abnormal; Notable for the following:    Calcium 8.8 (*)    All other components within normal limits  URINALYSIS COMPLETEWITH MICROSCOPIC ([facility] ONLY) - Abnormal; Notable for the following:    Color, Urine YELLOW (*)    APPearance CLEAR (*)    Ketones, ur TRACE (*)    Leukocytes, UA TRACE (*)    Squamous Epithelial / LPF 0-5 (*)    All other components within normal limits  TROPONIN I - Abnormal; Notable for the following:    Troponin I 0.03 (*)    All other components within normal limits  LIPASE, BLOOD   ____________________________________________  EKG  ED ECG REPORT I, Doran Stabler, the attending physician, personally viewed and interpreted this ECG.   Date: 12/02/2015  EKG Time: 1216  Rate: 53  Rhythm: Ventricular pacing at 53.  Axis: Normal  Intervals:Intraventricular conduction delay is consistent with ventricular pacing.  ST&T Change: T-wave inversion in aVL. Mild ST elevations consistent with paced rhythm.  ____________________________________________  RADIOLOGY  CT Head Wo Contrast (Accession [id number]) (Order [id number])  Imaging  Date: 12/02/2015 Department: [facility] EMERGENCY DEPARTMENT Released By/Authorizing: Orbie Pyo, MD (auto-released)  PACS Images   Show images for CT  Head Wo Contrast  Study Result   CLINICAL DATA:  Left periorbital ecchymosis status post fall  EXAM: CT HEAD WITHOUT CONTRAST  TECHNIQUE: Contiguous axial images were obtained from the base of the skull through the vertex without intravenous contrast.  COMPARISON:  04/03/2015  FINDINGS: Brain: No evidence of acute infarction, hemorrhage, extra-axial collection, ventriculomegaly, or mass effect. Generalized cerebral atrophy. Periventricular white matter low attenuation likely secondary to microangiopathy.  Vascular: Cerebrovascular atherosclerotic calcifications are noted.  Skull: Negative for fracture or focal lesion.  Sinuses/Orbits: Visualized portions of the orbits are unremarkable. Visualized portions of the paranasal sinuses and mastoid air cells are unremarkable.  Other: None.  IMPRESSION: No acute intracranial pathology.   Electronically Signed   By: Kathreen Devoid   On: 12/02/2015 13:23    CT Pelvis Wo Contrast (Accession [id number]) (Order [id number])  Imaging  Date: 12/02/2015 Department: [facility] EMERGENCY DEPARTMENT Released By/Authorizing: Orbie Pyo, MD (auto-released)  PACS Images   Show images for CT Pelvis Wo Contrast  Study Result   CLINICAL DATA:  Initial evaluation for pelvic abnormality found on previous x-ray.  EXAM: CT PELVIS WITHOUT CONTRAST  TECHNIQUE: Multidetector CT imaging of the pelvis was performed following the standard protocol without intravenous contrast.  COMPARISON:  Prior radiograph from 12/02/2015.  FINDINGS: Urinary Tract: Visualized portions of the ureters are grossly unremarkable on this noncontrast examination. Bladder within normal limits.  Bowel: Visualized portions of the bowel within normal limits without acute inflammation or obstruction. Anastomotic suture line partially visualized about the bowel within the left abdomen.  Vascular/Lymphatic:  Scattered aorto bi-iliac atherosclerotic calcifications present. No adenopathy.  Reproductive:  Uterus and ovaries within normal limits for age.  Other: No free air or fluid identified within the visualized abdomen and pelvis.  Musculoskeletal: Osseous deformity with probable remotely healed fractures of the left superior and inferior pubic rami are present, most like related to remote trauma. These appear chronic in nature, without definite acute component. Probable additional remotely healed fracture of the right inferior pubic ramus also present. No soft tissue mass or other abnormality seen about this deformity. No hematoma or other abnormality to suggest acute injury. Normal alignment preserved at the pubis symphysis without pubic diastasis. SI joints approximated. Remainder of the bones of the pelvis intact. No acute abnormality about the hips. Degenerative osteoarthrosis noted at the hips bilaterally. Mild degenerative spondylolysis and facet arthrosis noted within the lower lumbar spine.  IMPRESSION: 1. Deformity predominantly involving the left superior and inferior pubic rami adjacent to the pubis symphysis, most consistent with remotely healed trauma. 2. No other acute osseous abnormality within the pelvis. 3. Mild degenerative osteoarthrosis about the hips bilaterally.   Electronically Signed   By: Jeannine Boga M.D.   On: 12/02/2015 14:07   CT Maxillofacial Wo Contrast (Accession [id number]) (Order [id number])  Imaging  Date: 12/02/2015 Department: [facility] EMERGENCY DEPARTMENT Released By/Authorizing: Orbie Pyo, MD (auto-released)  PACS Images   Show images for CT Maxillofacial Wo Contrast  Study Result   CLINICAL DATA:  [place] last week with a left periorbital ecchymosis  EXAM: CT MAXILLOFACIAL WITHOUT CONTRAST  TECHNIQUE: Multidetector CT imaging of the maxillofacial structures was performed. Multiplanar  CT image reconstructions were also generated. A small metallic BB was placed on the right temple in order to reliably differentiate right from left.  COMPARISON:  CT brain scan of 04/03/2015  FINDINGS: Osseous: No maxillofacial fracture is seen. The periorbital soft tissues are unremarkable and the orbital rims are intact.  Orbits: No fracture seen.  No periorbital air is noted.  Sinuses: The paranasal sinuses are clear. The zygomatic arches are intact. The mandibular condylar heads are in normal position. Minimal mucosal thickening is present in the floor of the maxillary sinuses. The  nasal turbinates are normal in size and position.  Soft tissues: No significant soft tissue swelling is seen.  Limited intracranial: No intracranial abnormality is evident on the images obtained.  IMPRESSION: Negative no maxillofacial fracture is seen.   Electronically Signed   By: Ivar Drape M.D.   On: 12/02/2015 13:23    DG Chest 1 View (Accession [id number]) (Order [id number])  Imaging  Date: 12/02/2015 Department: [facility] EMERGENCY DEPARTMENT Released By/Authorizing: Orbie Pyo, MD (auto-released)  PACS Images   Show images for DG Chest 1 View  Study Result   CLINICAL DATA:  Status post fall ten days ago with chest congestion since then. History of diabetes, permanent pacemaker, aspiration pneumonia  EXAM: CHEST 1 VIEW  COMPARISON:  Portable chest x-ray of January 12, 2015  FINDINGS: The lungs are borderline hypoinflated. The interstitial markings are coarse but stable. The right hilar structures are prominent but also stable. The heart and pulmonary vascularity are normal. There is calcification in the wall of the aortic arch. The permanent pacemaker is in stable position. Hypoplasia of the left seventh rib, chronic  IMPRESSION: Chronic bronchitic changes.  No alveolar pneumonia nor CHF.  Aortic  atherosclerosis.   Electronically Signed   By: Anaiya Wisinski  [place] M.D.   On: 12/02/2015 13:08   DG Hand Complete Left (Accession [id number]) (Order [id number])  Imaging  Date: 12/02/2015 Department: [facility] EMERGENCY DEPARTMENT Released By/Authorizing: Orbie Pyo, MD (auto-released)  PACS Images   Show images for DG Hand Complete Left  Study Result   CLINICAL DATA:  Fall at nursing home 1-2 weeks ago.  Hand pain.  EXAM: LEFT HAND - COMPLETE 3+ VIEW  COMPARISON:  None.  FINDINGS: Mild degenerative changes at the first carpometacarpal joint. No acute bony abnormality. Specifically, no fracture, subluxation, or dislocation. Soft tissues are intact.  IMPRESSION: No acute bony abnormality.   Electronically Signed   By: Rolm Baptise M.D.   On: 12/02/2015 13:10    DG Pelvis 1-2 Views (Accession [id number]) (Order [id number])  Imaging  Date: 12/02/2015 Department: [facility] EMERGENCY DEPARTMENT Released By/Authorizing: Orbie Pyo, MD (auto-released)  PACS Images   Show images for DG Pelvis 1-2 Views  Study Result   CLINICAL DATA:  Fall 1-2 weeks ago.  EXAM: PELVIS - 1-2 VIEW  COMPARISON:  None  FINDINGS: Deformity of the left pubic bone and inferior pubic ramus which may be related to old injury. Mild degenerative changes in the hips with joint space narrowing and spurring. SI joints are symmetric and unremarkable.  IMPRESSION: Deformity of the left hit bone and inferior pubic ramus, possibly related to old injury. No definite acute fracture.  Mild degenerative changes in the hips bilaterally.   Electronically Signed   By: Rolm Baptise M.D.   On: 12/02/2015 13:08       ____________________________________________   PROCEDURES  Procedure(s) performed:   Procedures  Critical Care performed:   ____________________________________________   INITIAL IMPRESSION /  ASSESSMENT AND PLAN / ED COURSE  Pertinent labs & imaging results that were available during my care of the patient were reviewed by me and considered in my medical decision making (see chart for details).  ----------------------------------------- 2:52 PM on 12/02/2015 -----------------------------------------  Patient resting comfortably at this time. I updated the daughter about the labs and imaging. I will send the urine culture but I do not see an obvious UTI. The daughter still seems concerned about the patient's arm swelling. She says that  she was most concerned about the swelling and hand and says that her arm is not usually swollen. I asked her where exactly she has the most concerned and she says the hand but then also points to the patient's mid humerus. I'm able to range the arm at the shoulder as well as the elbow to full range of motion without the patient expressing any hour sign of pain. There is no deformity. There is no effusion at the elbow. The arm ranges smoothly with passive range of motion. I do not see any clinical findings of a fracture. There is no deformity. The patient's daughter is also concerned about the patient's elbow but I do not feel any swelling, effusion or any restriction to passive range of motion. I feel that it is highly unlikely that there is any fracture here. I also discussed the stable troponin. The daughter says that the patient has been recently placed in her current care facility and she has had multiple new medications added recently. I feel this is mostly because of the patient's weakness and altered mental status.   Clinical Course     ____________________________________________   FINAL CLINICAL IMPRESSION(S) / ED DIAGNOSES  Altered mental status.    NEW MEDICATIONS STARTED DURING THIS VISIT:  New Prescriptions   No medications on file     Note:  This document was prepared using Dragon voice recognition software and may include  unintentional dictation errors.    Orbie Pyo, MD 12/02/15 1455

## 2015-12-02 NOTE — ED Notes (Signed)
MD at bedside. 

## 2015-12-02 NOTE — ED Notes (Signed)
Patient transported to X-ray 

## 2015-12-02 NOTE — ED Triage Notes (Signed)
Per daughter pt from [facility] and had a fall last week resulting in left arm injury and facial bruising. Pt was started on new psych med. Daughter states she is "drugged up this morning". Pt noted to be very sleepy and dosing off in triage. Bruising noted to left hand and bruising noted to left orbital space.

## 2015-12-02 NOTE — ED Notes (Signed)
Pt is brought into ED by daughter for altered mental status. At baseline pt is oriented to self and pt was able to state her name in triage but not during assessment. Pt is notably drowsy and has hx of dementia and Alzheimers. Per daughter recently pt had a psyhiatric medication added after pt's fall last week where staff stated pt "threw herself on the floor" resulting in injuries evident on patient today. Upon assessment Left eye bruising, Left arm/elbow swelling, and Skin tear on R leg. Pt responds to voice. NAD noted. Daughter at bedside.

## 2015-12-03 LAB — URINE CULTURE

## 2016-10-02 ENCOUNTER — Emergency Department: Payer: Medicare Other

## 2016-10-02 ENCOUNTER — Inpatient Hospital Stay
Admission: EM | Admit: 2016-10-02 | Discharge: 2016-10-05 | DRG: 871 | Disposition: A | Discharge status: Home / Self Care | Payer: Medicare Other | Attending: Internal Medicine | Admitting: Internal Medicine

## 2016-10-02 ENCOUNTER — Encounter: Payer: Self-pay | Admitting: Emergency Medicine

## 2016-10-02 DIAGNOSIS — Y95 Nosocomial condition: Secondary | ICD-10-CM | POA: Diagnosis present

## 2016-10-02 DIAGNOSIS — Z880 Allergy status to penicillin: Secondary | ICD-10-CM

## 2016-10-02 DIAGNOSIS — Z8701 Personal history of pneumonia (recurrent): Secondary | ICD-10-CM

## 2016-10-02 DIAGNOSIS — E876 Hypokalemia: Secondary | ICD-10-CM | POA: Diagnosis present

## 2016-10-02 DIAGNOSIS — K219 Gastro-esophageal reflux disease without esophagitis: Secondary | ICD-10-CM | POA: Diagnosis present

## 2016-10-02 DIAGNOSIS — J45909 Unspecified asthma, uncomplicated: Secondary | ICD-10-CM | POA: Diagnosis present

## 2016-10-02 DIAGNOSIS — R1310 Dysphagia, unspecified: Secondary | ICD-10-CM | POA: Diagnosis present

## 2016-10-02 DIAGNOSIS — E872 Acidosis: Secondary | ICD-10-CM | POA: Diagnosis present

## 2016-10-02 DIAGNOSIS — F259 Schizoaffective disorder, unspecified: Secondary | ICD-10-CM | POA: Diagnosis present

## 2016-10-02 DIAGNOSIS — Z808 Family history of malignant neoplasm of other organs or systems: Secondary | ICD-10-CM | POA: Diagnosis not present

## 2016-10-02 DIAGNOSIS — A411 Sepsis due to other specified staphylococcus: Principal | ICD-10-CM | POA: Diagnosis present

## 2016-10-02 DIAGNOSIS — J189 Pneumonia, unspecified organism: Secondary | ICD-10-CM | POA: Diagnosis present

## 2016-10-02 DIAGNOSIS — R6520 Severe sepsis without septic shock: Secondary | ICD-10-CM | POA: Diagnosis present

## 2016-10-02 DIAGNOSIS — Z950 Presence of cardiac pacemaker: Secondary | ICD-10-CM | POA: Diagnosis not present

## 2016-10-02 DIAGNOSIS — E039 Hypothyroidism, unspecified: Secondary | ICD-10-CM | POA: Diagnosis present

## 2016-10-02 DIAGNOSIS — I361 Nonrheumatic tricuspid (valve) insufficiency: Secondary | ICD-10-CM | POA: Diagnosis not present

## 2016-10-02 DIAGNOSIS — Z8673 Personal history of transient ischemic attack (TIA), and cerebral infarction without residual deficits: Secondary | ICD-10-CM

## 2016-10-02 DIAGNOSIS — J9601 Acute respiratory failure with hypoxia: Secondary | ICD-10-CM

## 2016-10-02 DIAGNOSIS — Z7951 Long term (current) use of inhaled steroids: Secondary | ICD-10-CM | POA: Diagnosis not present

## 2016-10-02 DIAGNOSIS — A419 Sepsis, unspecified organism: Secondary | ICD-10-CM

## 2016-10-02 DIAGNOSIS — F0280 Dementia in other diseases classified elsewhere without behavioral disturbance: Secondary | ICD-10-CM | POA: Diagnosis present

## 2016-10-02 DIAGNOSIS — E119 Type 2 diabetes mellitus without complications: Secondary | ICD-10-CM | POA: Diagnosis present

## 2016-10-02 DIAGNOSIS — G309 Alzheimer's disease, unspecified: Secondary | ICD-10-CM | POA: Diagnosis present

## 2016-10-02 DIAGNOSIS — E860 Dehydration: Secondary | ICD-10-CM | POA: Diagnosis present

## 2016-10-02 DIAGNOSIS — Z818 Family history of other mental and behavioral disorders: Secondary | ICD-10-CM

## 2016-10-02 LAB — CBC WITH DIFFERENTIAL/PLATELET
Basophils Absolute: 0 10*3/uL (ref 0–0.1)
Basophils Relative: 0 %
Eosinophils Absolute: 0 10*3/uL (ref 0–0.7)
Eosinophils Relative: 0 %
HEMATOCRIT: 43.4 % (ref 35.0–47.0)
HEMOGLOBIN: 14.5 g/dL (ref 12.0–16.0)
LYMPHS ABS: 1 10*3/uL (ref 1.0–3.6)
LYMPHS PCT: 9 %
MCH: 29.5 pg (ref 26.0–34.0)
MCHC: 33.5 g/dL (ref 32.0–36.0)
MCV: 88.3 fL (ref 80.0–100.0)
MONOS PCT: 10 %
Monocytes Absolute: 1.1 10*3/uL — ABNORMAL HIGH (ref 0.2–0.9)
NEUTROS ABS: 9.4 10*3/uL — AB (ref 1.4–6.5)
NEUTROS PCT: 81 %
Platelets: 193 10*3/uL (ref 150–440)
RBC: 4.92 MIL/uL (ref 3.80–5.20)
RDW: 14 % (ref 11.5–14.5)
WBC: 11.5 10*3/uL — AB (ref 3.6–11.0)

## 2016-10-02 LAB — URINALYSIS, COMPLETE (UACMP) WITH MICROSCOPIC
BACTERIA UA: NONE SEEN
BILIRUBIN URINE: NEGATIVE
Glucose, UA: NEGATIVE mg/dL
KETONES UR: 5 mg/dL — AB
LEUKOCYTES UA: NEGATIVE
NITRITE: NEGATIVE
Protein, ur: 100 mg/dL — AB
SPECIFIC GRAVITY, URINE: 1.021 (ref 1.005–1.030)
pH: 6 (ref 5.0–8.0)

## 2016-10-02 LAB — COMPREHENSIVE METABOLIC PANEL
ALBUMIN: 3.1 g/dL — AB (ref 3.5–5.0)
ALT: 24 U/L (ref 14–54)
ANION GAP: 13 (ref 5–15)
AST: 44 U/L — AB (ref 15–41)
Alkaline Phosphatase: 96 U/L (ref 38–126)
BILIRUBIN TOTAL: 0.9 mg/dL (ref 0.3–1.2)
BUN: 19 mg/dL (ref 6–20)
CO2: 31 mmol/L (ref 22–32)
Calcium: 9.1 mg/dL (ref 8.9–10.3)
Chloride: 90 mmol/L — ABNORMAL LOW (ref 101–111)
Creatinine, Ser: 0.74 mg/dL (ref 0.44–1.00)
GFR calc Af Amer: >60 mL/min (ref >60)
GFR calc non Af Amer: >60 mL/min (ref >60)
Glucose, Bld: 136 mg/dL — ABNORMAL HIGH (ref 65–99)
POTASSIUM: 2.7 mmol/L — AB (ref 3.5–5.1)
SODIUM: 134 mmol/L — AB (ref 135–145)
TOTAL PROTEIN: 8.1 g/dL (ref 6.5–8.1)

## 2016-10-02 LAB — LACTIC ACID, PLASMA
Lactic Acid, Venous: 2 mmol/L (ref 0.5–1.9)
Lactic Acid, Venous: 2.4 mmol/L (ref 0.5–1.9)

## 2016-10-02 LAB — PROTIME-INR
INR: 1.11
PROTHROMBIN TIME: 14.4 s (ref 11.4–15.2)

## 2016-10-02 LAB — MRSA PCR SCREENING: MRSA by PCR: NEGATIVE

## 2016-10-02 LAB — TROPONIN I: TROPONIN I: 0.05 ng/mL — AB (ref <0.03)

## 2016-10-02 MED ORDER — SODIUM CHLORIDE 0.9 % IV BOLUS (SEPSIS)
250.0000 mL | Freq: Once | INTRAVENOUS | Status: AC
  Administered 2016-10-02: 250 mL via INTRAVENOUS

## 2016-10-02 MED ORDER — HYDROCHLOROTHIAZIDE 25 MG PO TABS
25.0000 mg | ORAL_TABLET | Freq: Every day | ORAL | Status: DC
  Administered 2016-10-03: 11:00:00 25 mg via ORAL
  Filled 2016-10-02: qty 1

## 2016-10-02 MED ORDER — LORATADINE 10 MG PO TABS
10.0000 mg | ORAL_TABLET | Freq: Every day | ORAL | Status: DC
  Administered 2016-10-03 – 2016-10-05 (×3): 10 mg via ORAL
  Filled 2016-10-02 (×3): qty 1

## 2016-10-02 MED ORDER — ALBUTEROL SULFATE (2.5 MG/3ML) 0.083% IN NEBU
5.0000 mg | INHALATION_SOLUTION | Freq: Once | RESPIRATORY_TRACT | Status: AC
  Administered 2016-10-02: 5 mg via RESPIRATORY_TRACT
  Filled 2016-10-02: qty 6

## 2016-10-02 MED ORDER — DIVALPROEX SODIUM ER 500 MG PO TB24
500.0000 mg | ORAL_TABLET | Freq: Every day | ORAL | Status: DC
  Administered 2016-10-02 – 2016-10-04 (×3): 500 mg via ORAL
  Filled 2016-10-02 (×4): qty 1

## 2016-10-02 MED ORDER — SODIUM CHLORIDE 0.9 % IV BOLUS (SEPSIS)
500.0000 mL | Freq: Once | INTRAVENOUS | Status: AC
  Administered 2016-10-02: 500 mL via INTRAVENOUS

## 2016-10-02 MED ORDER — DEXTROSE 5 % IV SOLN
2.0000 g | Freq: Once | INTRAVENOUS | Status: AC
  Administered 2016-10-02: 2 g via INTRAVENOUS
  Filled 2016-10-02: qty 2

## 2016-10-02 MED ORDER — LOPERAMIDE HCL 2 MG PO CAPS: 2.0000 mg | ORAL_CAPSULE | ORAL | Status: DC | PRN

## 2016-10-02 MED ORDER — FUROSEMIDE 20 MG PO TABS
20.0000 mg | ORAL_TABLET | ORAL | Status: DC
  Administered 2016-10-03 – 2016-10-05 (×2): 20 mg via ORAL
  Filled 2016-10-02 (×3): qty 1

## 2016-10-02 MED ORDER — VANCOMYCIN HCL IN DEXTROSE 1-5 GM/200ML-% IV SOLN
1000.0000 mg | Freq: Once | INTRAVENOUS | Status: AC
  Administered 2016-10-02: 1000 mg via INTRAVENOUS
  Filled 2016-10-02: qty 200

## 2016-10-02 MED ORDER — ENOXAPARIN SODIUM 40 MG/0.4ML ~~LOC~~ SOLN
40.0000 mg | SUBCUTANEOUS | Status: DC
  Administered 2016-10-02 – 2016-10-04 (×3): 40 mg via SUBCUTANEOUS
  Filled 2016-10-02 (×3): qty 0.4

## 2016-10-02 MED ORDER — VANCOMYCIN HCL IN DEXTROSE 750-5 MG/150ML-% IV SOLN
750.0000 mg | INTRAVENOUS | Status: DC
  Administered 2016-10-02 – 2016-10-05 (×3): 750 mg via INTRAVENOUS
  Filled 2016-10-02 (×3): qty 150

## 2016-10-02 MED ORDER — ONDANSETRON HCL 4 MG PO TABS: 4.0000 mg | ORAL_TABLET | Freq: Four times a day (QID) | ORAL | Status: DC | PRN

## 2016-10-02 MED ORDER — ACETAMINOPHEN 325 MG PO TABS
650.0000 mg | ORAL_TABLET | Freq: Four times a day (QID) | ORAL | Status: DC | PRN
  Administered 2016-10-02: 650 mg via ORAL
  Filled 2016-10-02: qty 2

## 2016-10-02 MED ORDER — RIVASTIGMINE 4.6 MG/24HR TD PT24
4.6000 mg | MEDICATED_PATCH | Freq: Every day | TRANSDERMAL | Status: DC
  Administered 2016-10-03 – 2016-10-05 (×3): 4.6 mg via TRANSDERMAL
  Filled 2016-10-02 (×3): qty 1

## 2016-10-02 MED ORDER — DEXTROSE 5 % IV SOLN: 500.0000 mg | Freq: Three times a day (TID) | INTRAVENOUS | Status: DC

## 2016-10-02 MED ORDER — GABAPENTIN 100 MG PO CAPS
200.0000 mg | ORAL_CAPSULE | Freq: Three times a day (TID) | ORAL | Status: DC
  Administered 2016-10-02 – 2016-10-05 (×8): 200 mg via ORAL
  Filled 2016-10-02 (×8): qty 2

## 2016-10-02 MED ORDER — METOPROLOL TARTRATE 25 MG PO TABS
12.5000 mg | ORAL_TABLET | Freq: Two times a day (BID) | ORAL | Status: DC
  Administered 2016-10-02 – 2016-10-05 (×6): 12.5 mg via ORAL
  Filled 2016-10-02 (×6): qty 1

## 2016-10-02 MED ORDER — SODIUM CHLORIDE 1 G PO TABS
1.0000 g | ORAL_TABLET | Freq: Three times a day (TID) | ORAL | Status: DC
  Administered 2016-10-02 – 2016-10-05 (×9): 1 g via ORAL
  Filled 2016-10-02 (×11): qty 1

## 2016-10-02 MED ORDER — DOCUSATE SODIUM 100 MG PO CAPS
100.0000 mg | ORAL_CAPSULE | Freq: Two times a day (BID) | ORAL | Status: DC
  Administered 2016-10-02 – 2016-10-05 (×6): 100 mg via ORAL
  Filled 2016-10-02 (×6): qty 1

## 2016-10-02 MED ORDER — DIPHENHYDRAMINE HCL 50 MG/ML IJ SOLN
INTRAMUSCULAR | Status: AC
  Administered 2016-10-02: 25 mg via INTRAVENOUS
  Filled 2016-10-02: qty 1

## 2016-10-02 MED ORDER — FERROUS SULFATE 325 (65 FE) MG PO TABS
325.0000 mg | ORAL_TABLET | Freq: Every day | ORAL | Status: DC
  Administered 2016-10-03 – 2016-10-05 (×3): 325 mg via ORAL
  Filled 2016-10-02 (×3): qty 1

## 2016-10-02 MED ORDER — SODIUM CHLORIDE 0.9 % IV BOLUS (SEPSIS)
1000.0000 mL | Freq: Once | INTRAVENOUS | Status: AC
  Administered 2016-10-02: 1000 mL via INTRAVENOUS

## 2016-10-02 MED ORDER — MELATONIN 5 MG PO TABS
2.5000 mg | ORAL_TABLET | Freq: Every day | ORAL | Status: DC
  Administered 2016-10-02 – 2016-10-04 (×3): 2.5 mg via ORAL
  Filled 2016-10-02 (×4): qty 0.5

## 2016-10-02 MED ORDER — SODIUM CHLORIDE 0.9 % IV SOLN
Freq: Once | INTRAVENOUS | Status: AC
  Administered 2016-10-02: 18:00:00 via INTRAVENOUS

## 2016-10-02 MED ORDER — GUAIFENESIN 100 MG/5ML PO SYRP
200.0000 mg | ORAL_SOLUTION | Freq: Four times a day (QID) | ORAL | Status: DC | PRN
  Filled 2016-10-02: qty 10

## 2016-10-02 MED ORDER — ALBUTEROL SULFATE (2.5 MG/3ML) 0.083% IN NEBU: 2.5000 mg | INHALATION_SOLUTION | Freq: Four times a day (QID) | RESPIRATORY_TRACT | Status: DC | PRN

## 2016-10-02 MED ORDER — ONDANSETRON HCL 4 MG/2ML IJ SOLN
4.0000 mg | Freq: Four times a day (QID) | INTRAMUSCULAR | Status: DC | PRN
  Administered 2016-10-04: 09:00:00 4 mg via INTRAVENOUS
  Filled 2016-10-02: qty 2

## 2016-10-02 MED ORDER — FLUTICASONE PROPIONATE 50 MCG/ACT NA SUSP
2.0000 | Freq: Every day | NASAL | Status: DC
  Administered 2016-10-03 – 2016-10-05 (×3): 2 via NASAL
  Filled 2016-10-02: qty 16

## 2016-10-02 MED ORDER — SERTRALINE HCL 50 MG PO TABS
50.0000 mg | ORAL_TABLET | Freq: Every day | ORAL | Status: DC
  Administered 2016-10-02 – 2016-10-04 (×3): 50 mg via ORAL
  Filled 2016-10-02 (×3): qty 1

## 2016-10-02 MED ORDER — LEVOTHYROXINE SODIUM 25 MCG PO TABS
125.0000 ug | ORAL_TABLET | Freq: Every day | ORAL | Status: DC
  Administered 2016-10-03 – 2016-10-05 (×3): 125 ug via ORAL
  Filled 2016-10-02 (×3): qty 1

## 2016-10-02 MED ORDER — POTASSIUM CHLORIDE CRYS ER 10 MEQ PO TBCR
10.0000 meq | EXTENDED_RELEASE_TABLET | ORAL | Status: DC
  Administered 2016-10-02: 10 meq via ORAL
  Filled 2016-10-02: qty 1

## 2016-10-02 MED ORDER — VITAMIN D 1000 UNITS PO TABS
2000.0000 [IU] | ORAL_TABLET | Freq: Every day | ORAL | Status: DC
  Administered 2016-10-03 – 2016-10-05 (×3): 2000 [IU] via ORAL
  Filled 2016-10-02 (×4): qty 2

## 2016-10-02 MED ORDER — ONDANSETRON HCL 4 MG PO TABS: 8.0000 mg | ORAL_TABLET | Freq: Three times a day (TID) | ORAL | Status: DC | PRN

## 2016-10-02 MED ORDER — ALUM & MAG HYDROXIDE-SIMETH 200-200-20 MG/5ML PO SUSP
30.0000 mL | ORAL | Status: DC | PRN
  Filled 2016-10-02: qty 30

## 2016-10-02 MED ORDER — TRAMADOL HCL 50 MG PO TABS
50.0000 mg | ORAL_TABLET | Freq: Two times a day (BID) | ORAL | Status: DC | PRN
Start: 2016-10-02 — End: 2016-10-05

## 2016-10-02 MED ORDER — MAGNESIUM HYDROXIDE 400 MG/5ML PO SUSP
30.0000 mL | Freq: Every day | ORAL | Status: DC | PRN
  Filled 2016-10-02: qty 30

## 2016-10-02 MED ORDER — POTASSIUM CHLORIDE CRYS ER 20 MEQ PO TBCR
40.0000 meq | EXTENDED_RELEASE_TABLET | Freq: Once | ORAL | Status: AC
  Administered 2016-10-02: 40 meq via ORAL
  Filled 2016-10-02: qty 2

## 2016-10-02 MED ORDER — ACETAMINOPHEN 500 MG PO TABS: 500.0000 mg | ORAL_TABLET | ORAL | Status: DC | PRN

## 2016-10-02 MED ORDER — DIPHENHYDRAMINE HCL 50 MG/ML IJ SOLN
25.0000 mg | Freq: Once | INTRAMUSCULAR | Status: AC
  Administered 2016-10-02: 25 mg via INTRAVENOUS

## 2016-10-02 MED ORDER — DEXTROSE 5 % IV SOLN
1.0000 g | Freq: Two times a day (BID) | INTRAVENOUS | Status: DC
  Administered 2016-10-03 – 2016-10-04 (×3): 1 g via INTRAVENOUS
  Filled 2016-10-02 (×4): qty 1

## 2016-10-02 NOTE — Clinical Social Work Note (Signed)
CSW is aware that patient is from [place]. CSW will assess when able.  Santiago Bumpers, MSW, Latanya Presser [phone]

## 2016-10-02 NOTE — ED Notes (Signed)
Dr. Quentin Cornwall notified of suspected Code Sepsis patient. Primary RN Larene Beach also notified

## 2016-10-02 NOTE — ED Notes (Signed)
Attempted to call report. Nurse unavailable. RN will call back shortly.

## 2016-10-02 NOTE — ED Notes (Signed)
Pt has started to develop a red rash to forhead and around hair line. Pt denies the rash is itchy. No swelling noted and no increased SOB.

## 2016-10-02 NOTE — Progress Notes (Signed)
Family Meeting Note  Advance Directive:yes  Today a meeting took place with the daughter and pt in ER  The following were discussed:Patient's diagnosis: , Patient's progosis: pt is being admitted with Bilateral pneumonia and sepsis Vitals stable D/w pt and dter Code status addressed and pt is FULL code    Time spent during discussion 16 mins  [place], MD

## 2016-10-02 NOTE — H&P (Signed)
[facility] [place] NAME: Jill Copeland    MR#:  [id number]  DATE OF BIRTH:  [birth year]  DATE OF ADMISSION:  10/02/2016  PRIMARY CARE PHYSICIAN: Marco Collie, MD   REQUESTING/REFERRING PHYSICIAN: Dr. Quentin Cornwall  CHIEF COMPLAINT:   Increasing shortness of breath and fever Cough with productive phlegm HISTORY OF PRESENT ILLNESS:  Jill Copeland  is a 83 y.o. female with a known history ofChronic dysphagia causing aspiration pneumonia in the past, history of esophageal stricture status post dilation shin in the past, Alzheimer's dementia, bipolar disorder comes to the emergency room from [place] after she started having increasing shortness of breath cough and fever Patient was found to have fever of 100.6 elevated white count and chest x-ray showing bilateral pneumonia. She is being admitted with sepsis secondary to multifocal pneumonia Patient received IV cefepime and vancomycin in the ER.   PAST MEDICAL HISTORY:   Past Medical History:  Diagnosis Date  . Alzheimer disease   . Arrhythmia    with syncope s/p PPM  . Arthritis   . Bacteremia    several timesf  . Diabetes mellitus without complication ([place])   . Dysphagia causing pulmonary aspiration with swallowing   . Esophageal stricture   . GERD (gastroesophageal reflux disease)   . Hiatal hernia    s/p repair with fistula  . Hypothyroidism   . Pneumococcal pneumonia ([place])   . Recurrent pneumonia   . Recurrent UTI   . Schizo affective schizophrenia ([place])   . Stroke (cerebrum) ([place])     PAST SURGICAL HISTOIRY:   Past Surgical History:  Procedure Laterality Date  . HERNIA REPAIR    . JEJUNOSTOMY FEEDING TUBE    . PACEMAKER INSERTION      SOCIAL HISTORY:   Social History  Substance Use Topics  . Smoking status: Never Smoker  . Smokeless tobacco: Never Used  . Alcohol use No    FAMILY HISTORY:   Family History  Problem Relation Age of Onset  . Thyroid  cancer Grandchild   . Thyroid nodules Daughter   . Thyroid cancer Daughter   . Dementia Mother   . Schizophrenia Maternal Aunt     DRUG ALLERGIES:   Allergies  Allergen Reactions  . Penicillins Other (See Comments)        REVIEW OF SYSTEMS:  Review of Systems  Constitutional: Positive for fever. Negative for chills and weight loss.  HENT: Negative for ear discharge, ear pain and nosebleeds.   Eyes: Negative for blurred vision, pain and discharge.  Respiratory: Positive for cough, sputum production and shortness of breath. Negative for wheezing and stridor.   Cardiovascular: Negative for chest pain, palpitations, orthopnea and PND.  Gastrointestinal: Negative for abdominal pain, diarrhea, nausea and vomiting.  Genitourinary: Negative for frequency and urgency.  Musculoskeletal: Negative for back pain and joint pain.  Neurological: Positive for weakness. Negative for sensory change, speech change and focal weakness.  Psychiatric/Behavioral: Negative for depression and hallucinations. The patient is not nervous/anxious.      MEDICATIONS AT HOME:   Prior to Admission medications   Medication Sig Start Date End Date Taking? Authorizing Provider  acetaminophen (TYLENOL) 500 MG tablet Take 500 mg by mouth every 4 (four) hours as needed.   Yes [provider]  alum & mag hydroxide-simeth (MINTOX) 200-200-20 MG/5ML suspension Take 30 mLs by mouth as needed for indigestion or heartburn.   Yes [provider]  cetirizine (ZYRTEC) 10 MG tablet Take  10 mg by mouth daily.   Yes [provider]  Cholecalciferol 2000 units TABS Take 2,000 Units by mouth daily.   Yes [provider]  divalproex (DEPAKOTE ER) 500 MG 24 hr tablet Take 500 mg by mouth at bedtime.   Yes [provider]  docusate sodium (COLACE) 100 MG capsule Take 100 mg by mouth 2 (two) times daily.   Yes [provider]  ferrous sulfate 325 (65 FE) MG tablet Take 325 mg by  mouth daily with breakfast.   Yes [provider]  fluticasone (FLONASE) 50 MCG/ACT nasal spray Place 2 sprays into both nostrils daily.   Yes [provider]  furosemide (LASIX) 20 MG tablet Take 20 mg by mouth every other day.   Yes [provider]  gabapentin (NEURONTIN) 100 MG capsule Take 200 mg by mouth 3 (three) times daily.   Yes [provider]  guaifenesin (ROBITUSSIN) 100 MG/5ML syrup Take 200 mg by mouth 4 (four) times daily as needed for cough.   Yes [provider]  hydrochlorothiazide (HYDRODIURIL) 12.5 MG tablet Take 25 mg by mouth daily.   Yes [provider]  levofloxacin (LEVAQUIN) 250 MG tablet Take 250 mg by mouth daily. 10/01/16 10/10/16 Yes [provider]  levothyroxine (SYNTHROID, LEVOTHROID) 125 MCG tablet Take 125 mcg by mouth daily before breakfast.    Yes [provider]  loperamide (IMODIUM) 2 MG capsule Take 2 mg by mouth as needed for diarrhea or loose stools.   Yes [provider]  magnesium hydroxide (MILK OF MAGNESIA) 400 MG/5ML suspension Take 30 mLs by mouth daily as needed for mild constipation.   Yes [provider]  Melatonin 3 MG TABS Take 3 mg by mouth at bedtime.   Yes [provider]  metoprolol tartrate (LOPRESSOR) 25 MG tablet Take 12.5 mg by mouth 2 (two) times daily.   Yes [provider]  neomycin-bacitracin-polymyxin (NEOSPORIN) ointment Apply 1 application topically as needed for wound care. apply to eye   Yes [provider]  ondansetron (ZOFRAN) 8 MG tablet Take 8 mg by mouth every 8 (eight) hours as needed for nausea or vomiting.   Yes [provider]  potassium chloride (K-DUR,KLOR-CON) 10 MEQ tablet Take 10 mEq by mouth every other day.   Yes [provider]  rivastigmine (EXELON) 4.6 mg/24hr Place 4.6 mg onto the skin daily.   Yes [provider]  saccharomyces boulardii (FLORASTOR) 250 MG capsule Take 1  capsule (250 mg total) by mouth 2 (two) times daily. Patient taking differently: Take 250 mg by mouth daily.  01/01/15  Yes Hosie Poisson, MD  senna (SENOKOT) 8.6 MG TABS tablet Take 1 tablet by mouth 2 (two) times a week. Mondays and Fridays   Yes [provider]  sertraline (ZOLOFT) 50 MG tablet Take 50 mg by mouth at bedtime.   Yes [provider]  sodium chloride 1 G tablet Take 1 g by mouth 3 (three) times daily.   Yes [provider]  traMADol (ULTRAM) 50 MG tablet Take 1 tablet (50 mg total) by mouth 2 (two) times daily as needed for moderate pain. Patient taking differently: Take 50 mg by mouth 2 (two) times daily.  01/14/15  Yes Sainani, Belia Heman, MD      VITAL SIGNS:  Blood pressure (!) 178/78, pulse 89, temperature (!) 102.7 F (39.3 C), temperature source Oral, resp. rate (!) 23, height 5\' 3"  (1.6 m), weight 53.5 kg (118 lb), SpO2  96 %.  PHYSICAL EXAMINATION:  GENERAL:  83 y.o.-year-old patient lying in the bed with no acute distress.  EYES: Pupils equal, round, reactive to light and accommodation. No scleral icterus. Extraocular muscles intact.  HEENT: Head atraumatic, normocephalic. Oropharynx and nasopharynx clear.  NECK:  Supple, no jugular venous distention. No thyroid enlargement, no tenderness.  LUNGS: Coarse breath sounds bilaterally, no wheezing, rales,rhonchi or crepitation. No use of accessory muscles of respiration.  CARDIOVASCULAR: S1, S2 normal. No murmurs, rubs, or gallops.  ABDOMEN: Soft, nontender, nondistended. Bowel sounds present. No organomegaly or mass.  EXTREMITIES: No pedal edema, cyanosis, or clubbing.  NEUROLOGIC: Cranial nerves II through XII are intact. Muscle strength 5/5 in all extremities. Sensation intact. Gait not checked.  PSYCHIATRIC: The patient is alert and oriented x 2  SKIN: No obvious rash, lesion, or ulcer.   LABORATORY PANEL:   CBC  Recent Labs Lab 10/02/16 1227  WBC 11.5*  HGB 14.5  HCT 43.4  PLT 193    ------------------------------------------------------------------------------------------------------------------  Chemistries   Recent Labs Lab 10/02/16 1227  NA 134*  K 2.7*  CL 90*  CO2 31  GLUCOSE 136*  BUN 19  CREATININE 0.74  CALCIUM 9.1  AST 44*  ALT 24  ALKPHOS 96  BILITOT 0.9   ------------------------------------------------------------------------------------------------------------------  Cardiac Enzymes  Recent Labs Lab 10/02/16 1227  TROPONINI 0.05*   ------------------------------------------------------------------------------------------------------------------  RADIOLOGY:  Dg Chest 2 View  Result Date: 10/02/2016 CLINICAL DATA:  Cough EXAM: CHEST  2 VIEW COMPARISON:  12/02/2015 FINDINGS: Heterogeneous opacities have developed throughout both lungs. Upper normal heart size. Left subclavian pacemaker device and leads are stable and intact. No pneumothorax. Tiny pleural effusions are suspected based on blunting of the posterior costophrenic angles. Osteopenia. An upper thoracic compression deformity is not significantly changed compared with 12/30/2014 IMPRESSION: Extensive patchy opacities throughout both lungs worrisome for airspace disease. Electronically Signed   By: Marybelle Killings M.D.   On: 10/02/2016 13:03   EKG Sinus tachycardia IMPRESSION AND PLAN:   Jill Copeland  is a 83 y.o. female with a known history ofChronic dysphagia causing aspiration pneumonia in the past, history of esophageal stricture status post dilation shin in the past, Alzheimer's dementia, bipolar disorder comes to the emergency room from [place] after she started having increasing shortness of breath cough and fever   1. Sepsis due to multifocal pneumonia -Admit to medical floor -Continue IV vancomycin and cefepime--- pharmacy to dose -MRSA PCR pending -Follow blood cultures sputum culture -Given history of dysphagia and aspiration the past will get speech  evaluation. -Patient normally takes mechanical soft diet at [facility] as needed -Incentive spirometer  2. Lactic acidosis and leukocytosis due to #1  3. Hypokalemia replete with  Oral  4. Hypothyroidism continue Synthroid  5. History of schizoaffective schizophrenia and Alzheimer's dementia -Continue her psych meds  6. DVT prophylaxis subcutaneous Lovenox   All the records are reviewed and case discussed with ED provider. Management plans discussed with the patient, family and they are in agreement.  CODE STATUS: full code  TOTAL TIME TAKING CARE OF THIS PATIE93minutes.    Crystallee Werden M.D on 10/02/2016 at 5:06 PM  Between 7am to 6pm - Pager - [phone]  After 6pm go to [email or web address] - password [email or web address] [facility]  [facility]  Office  [phone]  CC: Primary care physician; Marco Collie, MD

## 2016-10-02 NOTE — ED Triage Notes (Signed)
Pt arrived via POV with c/o coughing a lot, states she had an XR taken states the doctor found something in her lung. Pt stays at [facility]. Per daughter, pt has not been drinking and eating much. Has hx of aspiration PNA.  Was told on Tuesday pt had PNA, but did not treat her at [facility]. Daughter also worried pt may have UTI.

## 2016-10-02 NOTE — ED Provider Notes (Signed)
[facility] Emergency Department Provider Note    First MD Initiated Contact with Patient 10/02/16 1240     (approximate)  I have reviewed the triage vital signs and the nursing notes.   HISTORY  Chief Complaint Cough and Dehydration  Level V Caveat:  alzheimers dementia  HPI Jill Copeland is a 83 y.o. female history of Alzheimer's disease as well as CVA with history of pneumococcal pneumonia as well as aspiration pneumonia presents with 4 days of worsening cough and fever. Also complaining of urinary frequency. Patient does not wear home oxygen who presented to the ER febrile with acute respiratory failure with hypoxia.  States that she has been having thick productive cough. She reportedly had a chest x-ray done several days ago that showed probable pneumonia but was not started on any antibiotics.   Past Medical History:  Diagnosis Date  . Alzheimer disease   . Arrhythmia    with syncope s/p PPM  . Arthritis   . Bacteremia    several timesf  . Diabetes mellitus without complication ([place])   . Dysphagia causing pulmonary aspiration with swallowing   . Esophageal stricture   . GERD (gastroesophageal reflux disease)   . Hiatal hernia    s/p repair with fistula  . Hypothyroidism   . Pneumococcal pneumonia ([place])   . Recurrent pneumonia   . Recurrent UTI   . Schizo affective schizophrenia ([place])   . Stroke (cerebrum) [facility])    Family History  Problem Relation Age of Onset  . Thyroid cancer Grandchild   . Thyroid nodules Daughter   . Thyroid cancer Daughter   . Dementia Mother   . Schizophrenia Maternal Aunt    Past Surgical History:  Procedure Laterality Date  . HERNIA REPAIR    . JEJUNOSTOMY FEEDING TUBE    . PACEMAKER INSERTION     Patient Active Problem List   Diagnosis Date Noted  . Sepsis ([place]) 10/02/2016  . Schizoaffective disorder ([place])   . Pressure ulcer 01/13/2015  . Multiple falls 01/12/2015  . Overactive bladder  12/30/2014  . Diabetes mellitus without complication ([place])   . Alzheimer disease   . Schizo affective schizophrenia ([place])   . Recurrent pneumonia   . Dysphagia causing pulmonary aspiration with swallowing   . Pneumococcal pneumonia ([place])   . Arrhythmia   . Esophageal stricture   . Hypothyroidism   . Lung nodule   . Dementia in chronic schizophrenia ([place]) 08/12/2014  . Social discord 08/12/2014  . Family conflict 34/19/6222  . Schizoaffective disorder, unspecified type ([place])       Prior to Admission medications   Medication Sig Start Date End Date Taking? Authorizing Provider  acetaminophen (TYLENOL) 500 MG tablet Take 500 mg by mouth every 4 (four) hours as needed.   Yes [provider]  alum & mag hydroxide-simeth (MINTOX) 200-200-20 MG/5ML suspension Take 30 mLs by mouth as needed for indigestion or heartburn.   Yes [provider]  cetirizine (ZYRTEC) 10 MG tablet Take 10 mg by mouth daily.   Yes [provider]  Cholecalciferol 2000 units TABS Take 2,000 Units by mouth daily.   Yes [provider]  divalproex (DEPAKOTE ER) 500 MG 24 hr tablet Take 500 mg by mouth at bedtime.   Yes [provider]  docusate sodium (COLACE) 100 MG capsule Take 100 mg by mouth 2 (two) times daily.   Yes [provider]  ferrous sulfate 325 (65 FE) MG tablet Take 325 mg  by mouth daily with breakfast.   Yes [provider]  fluticasone (FLONASE) 50 MCG/ACT nasal spray Place 2 sprays into both nostrils daily.   Yes [provider]  furosemide (LASIX) 20 MG tablet Take 20 mg by mouth every other day.   Yes [provider]  gabapentin (NEURONTIN) 100 MG capsule Take 200 mg by mouth 3 (three) times daily.   Yes [provider]  guaifenesin (ROBITUSSIN) 100 MG/5ML syrup Take 200 mg by mouth 4 (four) times daily as needed for cough.   Yes [provider]  hydrochlorothiazide (HYDRODIURIL) 12.5 MG tablet Take 25  mg by mouth daily.   Yes [provider]  levofloxacin (LEVAQUIN) 250 MG tablet Take 250 mg by mouth daily. 10/01/16 10/10/16 Yes [provider]  levothyroxine (SYNTHROID, LEVOTHROID) 125 MCG tablet Take 125 mcg by mouth daily before breakfast.    Yes [provider]  loperamide (IMODIUM) 2 MG capsule Take 2 mg by mouth as needed for diarrhea or loose stools.   Yes [provider]  magnesium hydroxide (MILK OF MAGNESIA) 400 MG/5ML suspension Take 30 mLs by mouth daily as needed for mild constipation.   Yes [provider]  Melatonin 3 MG TABS Take 3 mg by mouth at bedtime.   Yes [provider]  metoprolol tartrate (LOPRESSOR) 25 MG tablet Take 12.5 mg by mouth 2 (two) times daily.   Yes [provider]  neomycin-bacitracin-polymyxin (NEOSPORIN) ointment Apply 1 application topically as needed for wound care. apply to eye   Yes [provider]  ondansetron (ZOFRAN) 8 MG tablet Take 8 mg by mouth every 8 (eight) hours as needed for nausea or vomiting.   Yes [provider]  potassium chloride (K-DUR,KLOR-CON) 10 MEQ tablet Take 10 mEq by mouth every other day.   Yes [provider]  rivastigmine (EXELON) 4.6 mg/24hr Place 4.6 mg onto the skin daily.   Yes [provider]  saccharomyces boulardii (FLORASTOR) 250 MG capsule Take 1 capsule (250 mg total) by mouth 2 (two) times daily. Patient taking differently: Take 250 mg by mouth daily.  01/01/15  Yes Hosie Poisson, MD  senna (SENOKOT) 8.6 MG TABS tablet Take 1 tablet by mouth 2 (two) times a week. Mondays and Fridays   Yes [provider]  sertraline (ZOLOFT) 50 MG tablet Take 50 mg by mouth at bedtime.   Yes [provider]  sodium chloride 1 G tablet Take 1 g by mouth 3 (three) times daily.   Yes [provider]  traMADol (ULTRAM) 50 MG tablet Take 1 tablet (50 mg total) by mouth 2 (two) times daily as needed for moderate  pain. Patient taking differently: Take 50 mg by mouth 2 (two) times daily.  01/14/15  Yes [name], MD    Allergies Penicillins    Social History Social History  Substance Use Topics  . Smoking status: Never Smoker  . Smokeless tobacco: Never Used  . Alcohol use No    Review of Systems Patient denies headaches, rhinorrhea, blurry vision, numbness, shortness of breath, chest pain, edema, cough, abdominal pain, nausea, vomiting, diarrhea, dysuria, fevers, rashes or hallucinations unless otherwise stated above in HPI. ____________________________________________   PHYSICAL EXAM:  VITAL SIGNS: Vitals:   10/02/16 1800 10/02/16 1954  BP:  (!) 128/53  Pulse:  74  Resp:  (!) 22  Temp: 99.7 F (37.6 C) 98.7 F (37.1 C)    Constitutional: Alert and oriented. Critically ill appearing in moderate respiratory  distress Eyes: Conjunctivae are normal.  Head: Atraumatic. Nose: No congestion/rhinnorhea. Mouth/Throat: Mucous membranes are moist.   Neck: No stridor. Painless ROM.  Cardiovascular: Normal rate, regular rhythm. Grossly normal heart sounds.  Good peripheral circulation. Respiratory: mild tachypnea on supplmental O2.  Diffuse rhonchorus breathsounds particularly in RLL Gastrointestinal: Soft and nontender. No distention. No abdominal bruits. No CVA tenderness. Musculoskeletal: No lower extremity tenderness nor edema.  No joint effusions. Neurologic:  Normal speech and language. No gross focal neurologic deficits are appreciated. No facial droop Skin:  Skin is warm, dry and intact. No rash noted. Psychiatric: Mood and affect are normal. Speech and behavior are normal.  ____________________________________________   LABS (all labs ordered are listed, but only abnormal results are displayed)  Results for orders placed or performed during the hospital encounter of 10/02/16 (from the past 24 hour(s))  Comprehensive metabolic panel     Status: Abnormal   Collection  Time: 10/02/16 12:27 PM  Result Value Ref Range   Sodium 134 (L) 135 - 145 mmol/L   Potassium 2.7 (LL) 3.5 - 5.1 mmol/L   Chloride 90 (L) 101 - 111 mmol/L   CO2 31 22 - 32 mmol/L   Glucose, Bld 136 (H) 65 - 99 mg/dL   BUN 19 6 - 20 mg/dL   Creatinine, Ser 0.74 0.44 - 1.00 mg/dL   Calcium 9.1 8.9 - 10.3 mg/dL   Total Protein 8.1 6.5 - 8.1 g/dL   Albumin 3.1 (L) 3.5 - 5.0 g/dL   AST 44 (H) 15 - 41 U/L   ALT 24 14 - 54 U/L   Alkaline Phosphatase 96 38 - 126 U/L   Total Bilirubin 0.9 0.3 - 1.2 mg/dL   GFR calc non Af Amer >60 >60 mL/min   GFR calc Af Amer >60 >60 mL/min   Anion gap 13 5 - 15  Lactic acid, plasma     Status: Abnormal   Collection Time: 10/02/16 12:27 PM  Result Value Ref Range   Lactic Acid, Venous 2.0 (HH) 0.5 - 1.9 mmol/L  CBC with Differential     Status: Abnormal   Collection Time: 10/02/16 12:27 PM  Result Value Ref Range   WBC 11.5 (H) 3.6 - 11.0 K/uL   RBC 4.92 3.80 - 5.20 MIL/uL   Hemoglobin 14.5 12.0 - 16.0 g/dL   HCT 43.4 35.0 - 47.0 %   MCV 88.3 80.0 - 100.0 fL   MCH 29.5 26.0 - 34.0 pg   MCHC 33.5 32.0 - 36.0 g/dL   RDW 14.0 11.5 - 14.5 %   Platelets 193 150 - 440 K/uL   Neutrophils Relative % 81 %   Neutro Abs 9.4 (H) 1.4 - 6.5 K/uL   Lymphocytes Relative 9 %   Lymphs Abs 1.0 1.0 - 3.6 K/uL   Monocytes Relative 10 %   Monocytes Absolute 1.1 (H) 0.2 - 0.9 K/uL   Eosinophils Relative 0 %   Eosinophils Absolute 0.0 0 - 0.7 K/uL   Basophils Relative 0 %   Basophils Absolute 0.0 0 - 0.1 K/uL  Protime-INR     Status: None   Collection Time: 10/02/16 12:27 PM  Result Value Ref Range   Prothrombin Time 14.4 11.4 - 15.2 seconds   INR 1.11   Urinalysis, Complete w Microscopic     Status: Abnormal   Collection Time: 10/02/16 12:27 PM  Result Value Ref Range   Color, Urine YELLOW (A) YELLOW   APPearance HAZY (A) CLEAR   Specific Gravity, Urine 1.021  1.005 - 1.030   pH 6.0 5.0 - 8.0   Glucose, UA NEGATIVE NEGATIVE mg/dL   Hgb urine dipstick SMALL  (A) NEGATIVE   Bilirubin Urine NEGATIVE NEGATIVE   Ketones, ur 5 (A) NEGATIVE mg/dL   Protein, ur 100 (A) NEGATIVE mg/dL   Nitrite NEGATIVE NEGATIVE   Leukocytes, UA NEGATIVE NEGATIVE   RBC / HPF 0-5 0 - 5 RBC/hpf   WBC, UA 0-5 0 - 5 WBC/hpf   Bacteria, UA NONE SEEN NONE SEEN   Squamous Epithelial / LPF 0-5 (A) NONE SEEN   Mucous PRESENT   Troponin I     Status: Abnormal   Collection Time: 10/02/16 12:27 PM  Result Value Ref Range   Troponin I 0.05 (HH) <0.03 ng/mL  Lactic acid, plasma     Status: Abnormal   Collection Time: 10/02/16  3:18 PM  Result Value Ref Range   Lactic Acid, Venous 2.4 (HH) 0.5 - 1.9 mmol/L  MRSA PCR Screening     Status: None   Collection Time: 10/02/16  6:22 PM  Result Value Ref Range   MRSA by PCR NEGATIVE NEGATIVE   ____________________________________________  EKG My review and personal interpretation at Time: 1:24   Indication: sob  Rate: 75  Rhythm: sinus Axis: normal Other: down sloping st changes in inferolateral leads ____________________________________________  RADIOLOGY  I personally reviewed all radiographic images ordered to evaluate for the above acute complaints and reviewed radiology reports and findings.  These findings were personally discussed with the patient.  Please see medical record for radiology report.  ____________________________________________   PROCEDURES  Procedure(s) performed:  Procedures    Critical Care performed: yes CRITICAL CARE Performed by: Merlyn Lot   Total critical care time: 45 minutes  Critical care time was exclusive of separately billable procedures and treating other patients.  Critical care was necessary to treat or prevent imminent or life-threatening deterioration.  Critical care was time spent personally by me on the following activities: development of treatment plan with patient and/or surrogate as well as nursing, discussions with consultants, evaluation of patient's  response to treatment, examination of patient, obtaining history from patient or surrogate, ordering and performing treatments and interventions, ordering and review of laboratory studies, ordering and review of radiographic studies, pulse oximetry and re-evaluation of patient's condition.  ____________________________________________   INITIAL IMPRESSION / ASSESSMENT AND PLAN / ED COURSE  Pertinent labs & imaging results that were available during my care of the patient were reviewed by me and considered in my medical decision making (see chart for details).  DDX: pna, uti, chf, acs, bacteremia  Terricka Onofrio is a 83 y.o. who presents to the ED with symptoms as described above. Patient does have evidence of severe sepsis with acute hypoxia or respiratory failure. Based on exam I am concern for pneumonia.  The patient will be placed on continuous pulse oximetry and telemetry for monitoring.  Laboratory evaluation will be sent to evaluate for the above complaints.     Clinical Course as of Oct 03 2127  Sat Oct 02, 2016  1358 Patient with acute hyponatremia as well as evidence of dehydration.  Patient receiving nebulization treatments. Patient sitting IV fluids for resuscitation. Do feel presentation most consistent with multifocal healthcare associated pneumonia. Probably due to also risk factors with increased aspiration risk. Patient will require admission to the hospital for further evaluation and management.  Have discussed with the patient and available family all diagnostics and treatments performed thus far and all questions  were answered to the best of my ability. The patient demonstrates understanding and agreement with plan.   [PR]    Clinical Course User Index [PR] Merlyn Lot, MD     ____________________________________________   FINAL CLINICAL IMPRESSION(S) / ED DIAGNOSES  Final diagnoses:  Severe sepsis ([place])  Acute respiratory failure with hypoxia ([place])  HCAP  (healthcare-associated pneumonia)      NEW MEDICATIONS STARTED DURING THIS VISIT:  Current Discharge Medication List       Note:  This document was prepared using Dragon voice recognition software and may include unintentional dictation errors.    Merlyn Lot, MD 10/02/16 2129

## 2016-10-02 NOTE — ED Notes (Signed)
Waiting for cefepime from pharmacy. Vancomycin started to prevent delay in care.

## 2016-10-02 NOTE — ED Notes (Signed)
RN called dietary to request meal tray as soon as possible. Food will be delivered to in patient room as soon as possible.

## 2016-10-02 NOTE — Progress Notes (Signed)
Pharmacy Antibiotic Note  Jill Copeland is a 83 y.o. female admitted on 10/02/2016 with  sepsis.  Pharmacy has been consulted for Vancomycin and cefepime dosing. Patient received vancomycin 1g IV and cefepime 2g IV x 1 dose in ED.   Patient has PCN allergy. According to patient's daughter patient developed a rash after taking a PCN when she was younger.   Plan: Ke: 0.041   Vd: 37.1   T1/2: 16.9    Will start patient on Vancomycin 750mg  IV every 24 hours with 11 stack dosing. Calculated trough at Css is 15. Trough ordered prior to 4th dose. Will monitor renal function and adjust dose as needed.   Will start patient on cefepime 1g IV every 12 hour based on current CrCl <60ml/min.   Height: 5\' 3"  (160 cm) Weight: 118 lb (53.5 kg) IBW/kg (Calculated) : 52.4  Temp (24hrs), Avg:100.6 F (38.1 C), Min:100.6 F (38.1 C), Max:100.6 F (38.1 C)   Recent Labs Lab 10/02/16 1227  WBC 11.5*  CREATININE 0.74  LATICACIDVEN 2.0*    Estimated Creatinine Clearance: 44.1 mL/min (by C-G formula based on SCr of 0.74 mg/dL).     Antimicrobials this admission: 7/14 vancomycin >>  7/14 cefepime >>   Dose adjustments this admission:  Microbiology results: 7/14 BCx: pending 7/14 UCx: sent  7/14 MRSA PCR: pending  Thank you for allowing pharmacy to be a part of this patient's care.  Pernell Dupre, PharmD, BCPS Clinical Pharmacist 10/02/2016 3:11 PM

## 2016-10-03 LAB — BLOOD CULTURE ID PANEL (REFLEXED)
Acinetobacter baumannii: NOT DETECTED
CANDIDA TROPICALIS: NOT DETECTED
Candida albicans: NOT DETECTED
Candida glabrata: NOT DETECTED
Candida krusei: NOT DETECTED
Candida parapsilosis: NOT DETECTED
ENTEROCOCCUS SPECIES: NOT DETECTED
Enterobacter cloacae complex: NOT DETECTED
Enterobacteriaceae species: NOT DETECTED
Escherichia coli: NOT DETECTED
HAEMOPHILUS INFLUENZAE: NOT DETECTED
Klebsiella oxytoca: NOT DETECTED
Klebsiella pneumoniae: NOT DETECTED
LISTERIA MONOCYTOGENES: NOT DETECTED
METHICILLIN RESISTANCE: NOT DETECTED
NEISSERIA MENINGITIDIS: NOT DETECTED
PROTEUS SPECIES: NOT DETECTED
Pseudomonas aeruginosa: NOT DETECTED
SERRATIA MARCESCENS: NOT DETECTED
STAPHYLOCOCCUS AUREUS BCID: NOT DETECTED
STAPHYLOCOCCUS SPECIES: DETECTED — AB
STREPTOCOCCUS SPECIES: NOT DETECTED
Streptococcus agalactiae: NOT DETECTED
Streptococcus pneumoniae: NOT DETECTED
Streptococcus pyogenes: NOT DETECTED

## 2016-10-03 LAB — URINE CULTURE: Culture: <10000 — AB

## 2016-10-03 LAB — MAGNESIUM: MAGNESIUM: 1.5 mg/dL — AB (ref 1.7–2.4)

## 2016-10-03 LAB — POTASSIUM: POTASSIUM: 2.7 mmol/L — AB (ref 3.5–5.1)

## 2016-10-03 MED ORDER — BUDESONIDE 0.5 MG/2ML IN SUSP
0.5000 mg | Freq: Two times a day (BID) | RESPIRATORY_TRACT | Status: DC
  Administered 2016-10-03 – 2016-10-05 (×4): 0.5 mg via RESPIRATORY_TRACT
  Filled 2016-10-03 (×4): qty 2

## 2016-10-03 MED ORDER — POTASSIUM CHLORIDE CRYS ER 20 MEQ PO TBCR
40.0000 meq | EXTENDED_RELEASE_TABLET | Freq: Three times a day (TID) | ORAL | Status: AC
  Administered 2016-10-03 (×3): 40 meq via ORAL
  Filled 2016-10-03 (×3): qty 2

## 2016-10-03 MED ORDER — IPRATROPIUM-ALBUTEROL 0.5-2.5 (3) MG/3ML IN SOLN
3.0000 mL | Freq: Four times a day (QID) | RESPIRATORY_TRACT | Status: DC
  Administered 2016-10-03 – 2016-10-04 (×5): 3 mL via RESPIRATORY_TRACT
  Filled 2016-10-03 (×5): qty 3

## 2016-10-03 MED ORDER — METHYLPREDNISOLONE SODIUM SUCC 40 MG IJ SOLR
40.0000 mg | Freq: Every day | INTRAMUSCULAR | Status: DC
  Administered 2016-10-03 – 2016-10-04 (×2): 40 mg via INTRAVENOUS
  Filled 2016-10-03 (×2): qty 1

## 2016-10-03 MED ORDER — AZITHROMYCIN 500 MG PO TABS
250.0000 mg | ORAL_TABLET | Freq: Every day | ORAL | Status: DC
Start: 2016-10-04 — End: 2016-10-05
  Administered 2016-10-04 – 2016-10-05 (×2): 250 mg via ORAL
  Filled 2016-10-03 (×2): qty 1

## 2016-10-03 MED ORDER — MAGNESIUM SULFATE 2 GM/50ML IV SOLN
2.0000 g | Freq: Once | INTRAVENOUS | Status: AC
  Administered 2016-10-03: 14:00:00 2 g via INTRAVENOUS
  Filled 2016-10-03: qty 50

## 2016-10-03 MED ORDER — AZITHROMYCIN 500 MG PO TABS
500.0000 mg | ORAL_TABLET | Freq: Every day | ORAL | Status: AC
  Administered 2016-10-03: 11:00:00 500 mg via ORAL
  Filled 2016-10-03: qty 1

## 2016-10-03 MED ORDER — POTASSIUM CHLORIDE IN NACL 20-0.9 MEQ/L-% IV SOLN
INTRAVENOUS | Status: DC
  Administered 2016-10-03: 17:00:00 via INTRAVENOUS
  Filled 2016-10-03 (×2): qty 1000

## 2016-10-03 MED ORDER — ENSURE ENLIVE PO LIQD
237.0000 mL | Freq: Two times a day (BID) | ORAL | Status: DC
  Administered 2016-10-03 – 2016-10-05 (×4): 237 mL via ORAL

## 2016-10-03 NOTE — Progress Notes (Addendum)
Initial Nutrition Assessment  DOCUMENTATION CODES:   Not applicable  INTERVENTION:  Noted SLP has already been consulted for swallowing evaluation. Agree that SLP evaluation is indicated. Will monitor for recommendations.  Encouraged adequate intake of calorie and protein at meals to prevent loss of body weight/lean body mass. Patient will likely need encouragement at each meal in setting of dementia.  Provide Ensure Enlive po BID, each supplement provides 350 kcal and 20 grams of protein. Patient prefers chocolate.  Provide Magic cup TID with meals, each supplement provides 290 kcal and 9 grams of protein.   NUTRITION DIAGNOSIS:   Inadequate oral intake related to poor appetite, nausea as evidenced by per patient/family report, meal completion < 50%.  GOAL:   Patient will meet greater than or equal to 90% of their needs  MONITOR:   PO intake, Supplement acceptance, Labs, Weight trends, I & O's  REASON FOR ASSESSMENT:   Malnutrition Screening Tool, Consult Assessment of nutrition requirement/status (and Poor PO)  ASSESSMENT:   83 year old female with PMHx of DM type 2, Alzheimer's disease, schizoaffective schizophrenia, hx of CVA, dysphagia with recurrent aspiration PNA, GERD, hx of esophageal stricture s/p dilation, hx of hiatal hernia s/p repair, hypothyroidism who presented from [facility] with SOB, fever found to have sepsis due to multifocal PNA.   Spoke with patient at bedside. No family members present. Patient reports she has a decreased appetite here. Reports the food here is different than at [facility]. Also reports she does not feel hungry. Noted on exam that patient is edentulous. She reports that she does not have any dentures, but that she can chew her mechanical soft food without issues. Denies any trouble swallowing at this time. Endorses nausea. Denies any abdominal pain, constipation/diarrhea. Patient reports she usually eats part of three meals daily at  [facility]. Unable to report exactly how much of the meals she finishes. Reports breakfast is her favorite meal. She reports she does not usually drink Ensure. She is amenable to drinking Ensure and trying Magic Cup to help meet calorie/protein needs in setting of her poor PO intake.  Able to discuss patient with RN at [facility] who has cared for her. She confirms patient is on a mechanical soft diet (dysphagia 3) with thin liquids. Reports patient usually eats well at meals. Patient does not receive an oral nutrition supplement at [facility].   Patient is unsure of her weight hx or UBW. Per chart patient was 125-130 lbs in 2016. RD obtained bed scale weight of 121.6 lbs.  Meal Completion: 30% of breakfast this morning  Medications reviewed and include: azithromycin, Vitamin D 2000 units daily, Colace, ferrous sulfate 325 mg daily, Lasix 20 mg every other day, hydrochlorothiazide 25 mg daily, levothyroxine, potassium chloride 10 mEq every other day, cefepime, vancomycin.  Labs reviewed: Potassium 2.7, Magnesium 1.5, Lactic Acid 2.4.   Nutrition-Focused physical exam completed. Findings are mild-moderate fat depletion, mild-moderate muscle depletion, and no edema. Patient is edentulous. Some degree of muscle wasting expected due to advanced age and limited mobility.  Diet Order:  DIET DYS 3 Room service appropriate? Yes; Fluid consistency: Thin  Skin:  Reviewed, no issues  Last BM:  10/02/2016  Height:   Ht Readings from Last 1 Encounters:  10/02/16 5\' 3"  (1.6 m)    Weight:   Wt Readings from Last 1 Encounters:  10/03/16 121 lb 9.6 oz (55.2 kg)    Ideal Body Weight:  52.3 kg  BMI:  Body mass  index is 21.54 kg/m.  Estimated Nutritional Needs:   Kcal:  1340-1605 (25-30 kcal/kg)  Protein:  64-74 grams (1.2-1.4 grams/kg)  Fluid:  1.3 L/day (25 ml/kg)  EDUCATION NEEDS:   No education needs identified at this time  Willey Blade, MS, RD, LDN Pager:  [phone] After Hours Pager: [phone]
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
## 2016-10-03 NOTE — Plan of Care (Signed)
Problem: Fluid Volume: Goal: Ability to maintain a balanced intake and output will improve Outcome: Not Progressing Pt has poor p.o. Intake. Awaiting dietary consult.

## 2016-10-03 NOTE — Progress Notes (Signed)
Made Santiago Glad, CSW aware that patient's daughter Lattie Haw is here and wanting to speak with her.  Santiago Glad to come to patient's room shortly.

## 2016-10-03 NOTE — Progress Notes (Signed)
Patient ID: Jill Copeland, female   DOB: 07/14/1931, 83 y.o.   MRN: [id number]  [employer] PROGRESS NOTE  Oneida Mckamey NWG:[id number] DOB: 09-Sep-1931 DOA: 10/02/2016 PCP: Marco Collie, MD  HPI/Subjective: Patient feeling sick and can't walk well. She's feeling very weak. Over the past few days she vomited 3 times. She's feeling short of breath with cough and wheeze. She does not wear oxygen at home.  Objective: Vitals:   10/02/16 1954 10/03/16 0404  BP: (!) 128/53 (!) 171/61  Pulse: 74 80  Resp: (!) 22 (!) 24  Temp: 98.7 F (37.1 C) 99.5 F (37.5 C)    Filed Weights   10/02/16 1222 10/03/16 1316  Weight: 53.5 kg (118 lb) 55.2 kg (121 lb 9.6 oz)    ROS: Review of Systems  Constitutional: Negative for chills and fever.  Eyes: Negative for blurred vision.  Respiratory: Positive for cough, shortness of breath and wheezing.   Cardiovascular: Negative for chest pain.  Gastrointestinal: Negative for abdominal pain, constipation, diarrhea, nausea and vomiting.  Genitourinary: Negative for dysuria.  Musculoskeletal: Negative for joint pain.  Neurological: Positive for weakness. Negative for dizziness and headaches.   Exam: Physical Exam  Constitutional: She is oriented to person, place, and time.  HENT:  Nose: No mucosal edema.  Mouth/Throat: No oropharyngeal exudate or posterior oropharyngeal edema.  Eyes: Pupils are equal, round, and reactive to light. Conjunctivae, EOM and lids are normal.  Neck: No JVD present. Carotid bruit is not present. No edema present. No thyroid mass and no thyromegaly present.  Cardiovascular: S1 normal and S2 normal.  Exam reveals no gallop.   No murmur heard. Pulses:      Dorsalis pedis pulses are 2+ on the right side, and 2+ on the left side.  Respiratory: No respiratory distress. She has decreased breath sounds in the right middle field, the right lower field, the left middle field and the left lower field. She has no wheezes. She  has rhonchi in the right lower field, the left middle field and the left lower field. She has no rales.  GI: Soft. Bowel sounds are normal. There is no tenderness.  Musculoskeletal:       Right ankle: She exhibits no swelling.       Left ankle: She exhibits no swelling.  Lymphadenopathy:    She has no cervical adenopathy.  Neurological: She is alert and oriented to person, place, and time. No cranial nerve deficit.  Skin: Skin is warm. No rash noted. Nails show no clubbing.  Psychiatric: She has a normal mood and affect.      Data Reviewed: Basic Metabolic Panel:  Recent Labs Lab 10/02/16 1227 10/03/16 0800  NA 134*  --   K 2.7* 2.7*  CL 90*  --   CO2 31  --   GLUCOSE 136*  --   BUN 19  --   CREATININE 0.74  --   CALCIUM 9.1  --   MG  --  1.5*   Liver Function Tests:  Recent Labs Lab 10/02/16 1227  AST 44*  ALT 24  ALKPHOS 96  BILITOT 0.9  PROT 8.1  ALBUMIN 3.1*   CBC:  Recent Labs Lab 10/02/16 1227  WBC 11.5*  NEUTROABS 9.4*  HGB 14.5  HCT 43.4  MCV 88.3  PLT 193   Cardiac Enzymes:  Recent Labs Lab 10/02/16 1227  TROPONINI 0.05*    Recent Results (from the past 240 hour(s))  Culture, blood (Routine x 2)  Status: None (Preliminary result)   Collection Time: 10/02/16 12:27 PM  Result Value Ref Range Status   Specimen Description BLOOD BLOOD LEFT WRIST  Final   Special Requests   Final    BOTTLES DRAWN AEROBIC AND ANAEROBIC Blood Culture adequate volume   Culture  Setup Time AEROBIC BOTTLE ONLY GRAM POSITIVE COCCI   Final   Culture GRAM POSITIVE COCCI  Final   Report Status PENDING  Incomplete  Culture, blood (Routine x 2)     Status: None (Preliminary result)   Collection Time: 10/02/16 12:27 PM  Result Value Ref Range Status   Specimen Description BLOOD RIGHT ANTECUBITAL  Final   Special Requests   Final    BOTTLES DRAWN AEROBIC AND ANAEROBIC Blood Culture adequate volume   Culture  Setup Time   Final    Organism ID to  follow AEROBIC BOTTLE ONLY GRAM POSITIVE COCCI    Culture GRAM POSITIVE COCCI  Final   Report Status PENDING  Incomplete  MRSA PCR Screening     Status: None   Collection Time: 10/02/16  6:22 PM  Result Value Ref Range Status   MRSA by PCR NEGATIVE NEGATIVE Final    Comment:        The GeneXpert MRSA Assay (FDA approved for NASAL specimens only), is one component of a comprehensive MRSA colonization surveillance program. It is not intended to diagnose MRSA infection nor to guide or monitor treatment for MRSA infections.      Studies: Dg Chest 2 View  Result Date: 10/02/2016 CLINICAL DATA:  Cough EXAM: CHEST  2 VIEW COMPARISON:  12/02/2015 FINDINGS: Heterogeneous opacities have developed throughout both lungs. Upper normal heart size. Left subclavian pacemaker device and leads are stable and intact. No pneumothorax. Tiny pleural effusions are suspected based on blunting of the posterior costophrenic angles. Osteopenia. An upper thoracic compression deformity is not significantly changed compared with 12/30/2014 IMPRESSION: Extensive patchy opacities throughout both lungs worrisome for airspace disease. Electronically Signed   By: Marybelle Killings M.D.   On: 10/02/2016 13:03    Scheduled Meds: . [START ON 10/04/2016] azithromycin  250 mg Oral Daily  . cholecalciferol  2,000 Units Oral Daily  . divalproex  500 mg Oral QHS  . docusate sodium  100 mg Oral BID  . enoxaparin (LOVENOX) injection  40 mg Subcutaneous Q24H  . feeding supplement (ENSURE ENLIVE)  237 mL Oral BID BM  . ferrous sulfate  325 mg Oral Q breakfast  . fluticasone  2 spray Each Nare Daily  . furosemide  20 mg Oral QODAY  . gabapentin  200 mg Oral TID  . hydrochlorothiazide  25 mg Oral Daily  . levothyroxine  125 mcg Oral QAC breakfast  . loratadine  10 mg Oral Daily  . Melatonin  2.5 mg Oral QHS  . metoprolol tartrate  12.5 mg Oral BID  . potassium chloride  10 mEq Oral QODAY  . potassium chloride  40 mEq Oral  TID  . rivastigmine  4.6 mg Transdermal Daily  . sertraline  50 mg Oral QHS  . sodium chloride  1 g Oral TID   Continuous Infusions: . ceFEPime (MAXIPIME) IV Stopped (10/03/16 0546)  . magnesium sulfate 1 - 4 g bolus IVPB    . vancomycin Stopped (10/03/16 0037)    Assessment/Plan:  1. Clinical sepsis, bilateral pneumonia. Blood cultures growing gram-positive cocci. Continue Maxipime and vancomycin. Also on Zithromax. 2. Asthmatic bronchitis. Need to get better air entry in the lungs. Start low-dose Solu-Medrol  40 mg IV daily. Budesonide and DuoNeb nebulizer solution. 3. Hypokalemia and hypomagnesemia.. Stop hydrochlorothiazide. Replace potassium and magnesium. 4. Weakness. Physical therapy evaluation 5. Hypothyroidism unspecified on levothyroxin 6. History of schizophrenia on psych medications  Code Status:     Code Status Orders        Start     Ordered   10/02/16 1627  Full code  Continuous     10/02/16 1627    Code Status History    Date Active Date Inactive Code Status Order ID Comments User Context   01/12/2015 10:33 PM 01/15/2015 12:09 AM Full Code [id number]  Lytle Butte, MD ED   12/30/2014  4:37 PM 01/02/2015  9:00 PM DNR [id number]  Erick Colace, NP Inpatient   12/30/2014 11:53 AM 12/30/2014  4:34 PM Full Code [id number]  Janece Canterbury, MD Inpatient    Advance Directive Documentation     Most Recent Value  Type of Advance Directive  Healthcare Power of Attorney  Pre-existing out of facility DNR order (yellow form or pink MOST form)  -  "MOST" Form in Place?  -     Family Communication: Tried to call the patient's daughter but the phone number would not leave a message and the mailbox was full. Disposition Plan: To be determined  Antibiotics:  Vancomycin  Maxipime  Zithromax  Time spent: 28 minutes. Patient seen with the patient's nurse today.  Loletha Grayer  [employer]
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
## 2016-10-03 NOTE — Progress Notes (Signed)
CRITICAL VALUE ALERT  Critical Value: 2.7  Date & Time Notied:  10/03/2016 at 2336 Provider Notified:Dr. Leslye Peer  Orders Received/Actions taken: new orders received

## 2016-10-04 LAB — BASIC METABOLIC PANEL
ANION GAP: 7 (ref 5–15)
BUN: 11 mg/dL (ref 6–20)
CALCIUM: 8.7 mg/dL — AB (ref 8.9–10.3)
CO2: 32 mmol/L (ref 22–32)
Chloride: 98 mmol/L — ABNORMAL LOW (ref 101–111)
Creatinine, Ser: 0.58 mg/dL (ref 0.44–1.00)
GFR calc Af Amer: >60 mL/min (ref >60)
GFR calc non Af Amer: >60 mL/min (ref >60)
GLUCOSE: 161 mg/dL — AB (ref 65–99)
Potassium: 3.4 mmol/L — ABNORMAL LOW (ref 3.5–5.1)
Sodium: 137 mmol/L (ref 135–145)

## 2016-10-04 LAB — MAGNESIUM: Magnesium: 1.8 mg/dL (ref 1.7–2.4)

## 2016-10-04 MED ORDER — CEFUROXIME AXETIL 500 MG PO TABS
500.0000 mg | ORAL_TABLET | Freq: Two times a day (BID) | ORAL | Status: DC
  Administered 2016-10-04 – 2016-10-05 (×3): 500 mg via ORAL
  Filled 2016-10-04 (×4): qty 1

## 2016-10-04 MED ORDER — IPRATROPIUM-ALBUTEROL 0.5-2.5 (3) MG/3ML IN SOLN
3.0000 mL | Freq: Three times a day (TID) | RESPIRATORY_TRACT | Status: DC
  Administered 2016-10-04 – 2016-10-05 (×2): 3 mL via RESPIRATORY_TRACT
  Filled 2016-10-04 (×3): qty 3

## 2016-10-04 MED ORDER — PREDNISONE 50 MG PO TABS
50.0000 mg | ORAL_TABLET | Freq: Every day | ORAL | Status: DC
  Administered 2016-10-04 – 2016-10-05 (×2): 50 mg via ORAL
  Filled 2016-10-04 (×2): qty 1

## 2016-10-04 NOTE — Progress Notes (Signed)
[place] [facility] [place] NAME: Jill Copeland    MR#:  [id number]  DATE OF BIRTH:  12/10/1931  SUBJECTIVE:   Weak.  vomitted oral meds that she took on empty stomach this morning.  She has been eating some meals REVIEW OF SYSTEMS:   Review of Systems  Constitutional: Negative for chills, fever and weight loss.  HENT: Negative for ear discharge, ear pain and nosebleeds.   Eyes: Negative for blurred vision, pain and discharge.  Respiratory: Negative for sputum production, shortness of breath, wheezing and stridor.   Cardiovascular: Negative for chest pain, palpitations, orthopnea and PND.  Gastrointestinal: Positive for nausea and vomiting. Negative for abdominal pain and diarrhea.  Genitourinary: Negative for frequency and urgency.  Musculoskeletal: Negative for back pain and joint pain.  Neurological: Positive for weakness. Negative for sensory change, speech change and focal weakness.  Psychiatric/Behavioral: Negative for depression and hallucinations. The patient is not nervous/anxious.    Tolerating Diet:yes some Tolerating PT: pending  DRUG ALLERGIES:   Allergies  Allergen Reactions  . Penicillins Other (See Comments)        VITALS:  Blood pressure (!) 155/64, pulse 68, temperature 97.7 F (36.5 C), temperature source Oral, resp. rate 19, height 5\' 3"  (1.6 m), weight 55.2 kg (121 lb 9.6 oz), SpO2 95 %.  PHYSICAL EXAMINATION:   Physical Exam  GENERAL:  83 y.o.-year-old patient lying in the bed with no acute distress. weak EYES: Pupils equal, round, reactive to light and accommodation. No scleral icterus. Extraocular muscles intact.  HEENT: Head atraumatic, normocephalic. Oropharynx and nasopharynx clear.  NECK:  Supple, no jugular venous distention. No thyroid enlargement, no tenderness.  LUNGS: decreased breath sounds bilaterally, no wheezing, rales, rhonchi. No use of accessory muscles of respiration.  CARDIOVASCULAR:  S1, S2 normal. No murmurs, rubs, or gallops.  ABDOMEN: Soft, nontender, nondistended. Bowel sounds present. No organomegaly or mass.  EXTREMITIES: No cyanosis, clubbing or edema b/l.    NEUROLOGIC: Cranial nerves II through XII are intact. No focal Motor or sensory deficits b/l.   PSYCHIATRIC:  patient is alert and awake  SKIN: No obvious rash, lesion, or ulcer.   LABORATORY PANEL:  CBC  Recent Labs Lab 10/02/16 1227  WBC 11.5*  HGB 14.5  HCT 43.4  PLT 193    Chemistries   Recent Labs Lab 10/02/16 1227  10/04/16 0414  NA 134*  --  137  K 2.7*  < > 3.4*  CL 90*  --  98*  CO2 31  --  32  GLUCOSE 136*  --  161*  BUN 19  --  11  CREATININE 0.74  --  0.58  CALCIUM 9.1  --  8.7*  MG  --   < > 1.8  AST 44*  --   --   ALT 24  --   --   ALKPHOS 96  --   --   BILITOT 0.9  --   --   < > = values in this interval not displayed. Cardiac Enzymes  Recent Labs Lab 10/02/16 1227  TROPONINI 0.05*   RADIOLOGY:  Dg Chest 2 View  Result Date: 10/02/2016 CLINICAL DATA:  Cough EXAM: CHEST  2 VIEW COMPARISON:  12/02/2015 FINDINGS: Heterogeneous opacities have developed throughout both lungs. Upper normal heart size. Left subclavian pacemaker device and leads are stable and intact. No pneumothorax. Tiny pleural effusions are suspected based on blunting of the posterior costophrenic angles. Osteopenia. An upper thoracic compression deformity  is not significantly changed compared with 12/30/2014 IMPRESSION: Extensive patchy opacities throughout both lungs worrisome for airspace disease. Electronically Signed   By: Jill Copeland M.D.   On: 10/02/2016 13:03   ASSESSMENT AND PLAN:   Jill Copeland  is a 83 y.o. female with a known history of Chronic dysphagia causing aspiration pneumonia in the past, history of esophageal stricture status post dilation shin in the past, Alzheimer's dementia, bipolar disorder comes to the emergency room from [place] after she started having increasing  shortness of breath cough and fever   1. Sepsis due to multifocal pneumonia -Continue IV vancomycin, zithromax and cefepime--- pharmacy to dose -MRSA PCR negative - blood cultures  Staph aureus---change to ceftin and zithromax. D/c vanc. D/w pharmacy =sputum culture not done -appreciated speech evaluation -Patient normally takes mechanical soft diet at [place] as needed -Incentive spirometer -sats 95% on RA  2. Lactic acidosis and leukocytosis due to #1 -improved  3. Hypokalemia replete with  Oral  4. Hypothyroidism continue Synthroid  5. History of schizoaffective schizophrenia and Alzheimer's dementia -Continue her psych meds  6. DVT prophylaxis subcutaneous Lovenox  7. PT to work with pt CSW /CM for d/c planning  Case discussed with Care Management/Social Worker. Management plans discussed with the patient, family and they are in agreement.  CODE STATUS: full  DVT Prophylaxis: lovenox TOTAL TIME TAKING CARE OF THIS PATIENT:30 minutes.  >50% time spent on counselling and coordination of care  POSSIBLE D/C IN 1-2 DAYS, DEPENDING ON CLINICAL CONDITION.  Note: This dictation was prepared with Dragon dictation along with smaller phrase technology. Any transcriptional errors that result from this process are unintentional.  Kinslee Dalpe M.D on 10/04/2016 at 9:05 AM  Between 7am to 6pm - Pager - [phone]  After 6pm go to [email or web address] - password [email or web address] [facility]  Office  [phone]  CC: Primary care physician; Marco Collie, MD

## 2016-10-04 NOTE — Evaluation (Signed)
Physical Therapy Evaluation Patient Details Name: Jill Copeland MRN: [id number] DOB: 03/22/1932 Today's Date: 10/04/2016   History of Present Illness  Pt presented to ED with SOB, fever and cough that had progressively gotten worse over 4 days. Pt stated that X-ray showed "something" in her lung. Pt admitted with acute respiratory failure, hypoxia and sepsis secondary to multifocal pneumonia. PMH: falls, bipolar disorder, schizophrenia, DM, dysphagia, pacemaker, overreactive bladder, Alzheimer's, CVA, arrhythmia, PNA, aspiration.    Clinical Impression  Prior to admission pt reports ambulation with 4-wheel RW around room and to dining hall with no supervision while at [place]. Pt reports assistance for bathing and dressing. Pt alert and oriented to self, place, situation. Pt unable to recall month, day, year. Pt's O2 sitting on EOB on RA 81%. Pt placed on 2L O2 via nasal cannula to address O2 levels (92% and higher prior to, and during, ambulation on 2L via nasal cannula). Pt able to perform sit-to-stand and ambulate with CGA and RW. Pt will benefit from continued skilled PT to address endurance, balance and gait deficits (see below). Recommended discharge to ALF with HHPT and supervision for mobility for O2 tubing management.    Follow Up Recommendations Home health PT;Supervision for mobility/OOB;Other (comment) (ALF)    Equipment Recommendations  Rolling walker with 5" wheels    Recommendations for Other Services       Precautions / Restrictions Precautions Precautions: Fall;ICD/Pacemaker Restrictions Weight Bearing Restrictions: No      Mobility  Bed Mobility Overal bed mobility: Modified Independent Bed Mobility: Supine to Sit     Supine to sit: Modified independent (Device/Increase time);HOB elevated     General bed mobility comments: pt requires increased effort and time with HOB elevated  Transfers Overall transfer level: Needs assistance Equipment used:  Rolling walker (2 wheeled) Transfers: Sit to/from Stand Sit to Stand: Min guard  Pt reported need for use of the [facility] during session; transfer to [facility] with CGA for safety and RW         General transfer comment: CGA for safety with RW with cueing initially for proper hand placement  Ambulation/Gait Ambulation/Gait assistance: Min guard   Assistive device: Rolling walker (2 wheeled)   Gait velocity: decreased gait velocity   General Gait Details: pt able to ambulate with CGA and RW 20 feet to the door; stand break for pulse ox reading; 80 feet to the corner of the nursing station and back to her bedside chair; pt presents with minimal increased B trunk sway, B flat foot (minimal heel strike), decreased B step length  Stairs            Wheelchair Mobility    Modified Rankin (Stroke Patients Only)       Balance Overall balance assessment: Needs assistance Sitting-balance support: No upper extremity supported;Feet supported Sitting balance-Leahy Scale: Good Sitting balance - Comments: pt able to perform static sitting balance with no UE supported with no overt loss of balance   Standing balance support: Bilateral upper extremity supported Standing balance-Leahy Scale: Good Standing balance comment: pt able to perform static standing balance and marching in place with B UE support on RW with no overt loss of balance                             Pertinent Vitals/Pain Pain Assessment: No/denies pain  HR - 74 to 88 bpm during this session via pulse ox O2 - 81% on RA  sitting on EOB; 92% on 2 L sitting on EOB prior to ambulation; 96% on 2 L end of session in bedside chair    Home Living Family/patient expects to be discharged to:: Assisted living               Home Equipment: Walker - 4 wheels;Grab bars - toilet;Grab bars - tub/shower      Prior Function Level of Independence: Needs assistance   Gait / Transfers Assistance Needed: pt reports ambulating  to/from the dining hall with 4-wheel walker without supervision, history of falls (per pt's daughter, falls occurred more when ambulating outdoors on uneven surfaces, but has had some falls inside as well)  ADL's / Homemaking Assistance Needed: pt reports assistance with showering and dressing only        Hand Dominance        Extremity/Trunk Assessment   Upper Extremity Assessment Upper Extremity Assessment: Overall WFL for tasks assessed    Lower Extremity Assessment Lower Extremity Assessment: Generalized weakness       Communication   Communication: No difficulties  Cognition Arousal/Alertness: Awake/alert Behavior During Therapy: WFL for tasks assessed/performed Overall Cognitive Status: History of cognitive impairments - at baseline                                 General Comments: pt oriented to person, place, and situation; pt unable to recall month, day or year for time; pt has history of Alzheimer's      General Comments      Exercises Total Joint Exercises Marching in Standing: AROM;Strengthening;Both;10 reps;Standing (10 reps on B LE; vc's for less body sway)   Assessment/Plan    PT Assessment Patient needs continued PT services  PT Problem List Decreased strength;Decreased activity tolerance;Decreased balance;Decreased mobility;Decreased coordination;Decreased cognition;Decreased knowledge of use of DME;Decreased safety awareness;Cardiopulmonary status limiting activity       PT Treatment Interventions DME instruction;Gait training;Functional mobility training;Therapeutic activities;Therapeutic exercise;Balance training;Patient/family education    PT Goals (Current goals can be found in the Care Plan section)  Acute Rehab PT Goals Patient Stated Goal: to get stronger and be able to walk farther PT Goal Formulation: With patient Time For Goal Achievement: 10/18/16 Potential to Achieve Goals: Good    Frequency Min 2X/week   Barriers  to discharge        Co-evaluation               AM-PAC PT "6 Clicks" Daily Activity  Outcome Measure Difficulty turning over in bed (including adjusting bedclothes, sheets and blankets)?: A Little Difficulty moving from lying on back to sitting on the side of the bed? : A Little Difficulty sitting down on and standing up from a chair with arms (e.g., wheelchair, bedside commode, etc,.)?: Total Help needed moving to and from a bed to chair (including a wheelchair)?: A Little Help needed walking in hospital room?: A Little Help needed climbing 3-5 steps with a railing? : A Lot 6 Click Score: 15    End of Session Equipment Utilized During Treatment: Gait belt;Oxygen Activity Tolerance: Patient limited by fatigue Patient left: in chair;with call bell/phone within reach;with chair alarm set;with family/visitor present Nurse Communication: Mobility status;Precautions;Other (comment) (via whiteboard; also notified nursing of O2 levels ) PT Visit Diagnosis: Unsteadiness on feet (R26.81);Other abnormalities of gait and mobility (R26.89);Muscle weakness (generalized) (M62.81);History of falling (Z91.81)    Time: 3329-5188 PT Time Calculation (min) (ACUTE ONLY): 49 min  Charges:         PT G CodesLaqueta Carina, SPT 10-18-16,11:19 AM [phone]

## 2016-10-04 NOTE — Care Management (Signed)
Patient is from [place].  Physical therapy consult is pending.

## 2016-10-04 NOTE — Evaluation (Signed)
Clinical/Bedside Swallow Evaluation Patient Details  Name: Jill Copeland MRN: [id number] Date of Birth: 1931/04/16  Today's Date: 10/04/2016 Time: SLP Start Time (ACUTE ONLY): 0845 SLP Stop Time (ACUTE ONLY): 0945 SLP Time Calculation (min) (ACUTE ONLY): 60 min  Past Medical History:  Past Medical History:  Diagnosis Date  . Alzheimer disease   . Arrhythmia    with syncope s/p PPM  . Arthritis   . Bacteremia    several timesf  . Diabetes mellitus without complication ([place])   . Dysphagia causing pulmonary aspiration with swallowing   . Esophageal stricture   . GERD (gastroesophageal reflux disease)   . Hiatal hernia    s/p repair with fistula  . Hypothyroidism   . Pneumococcal pneumonia ([place])   . Recurrent pneumonia   . Recurrent UTI   . Schizo affective schizophrenia ([place])   . Stroke (cerebrum) [facility])    Past Surgical History:  Past Surgical History:  Procedure Laterality Date  . HERNIA REPAIR    . JEJUNOSTOMY FEEDING TUBE    . PACEMAKER INSERTION     HPI:  Pt is a 83 y/o female w/ PMH + for schizoaffective disorder w/ schizophrenia requiring multiple visits to the emergency department, inpatient hospitalizations, Alzheimer's dementia, diabetes mellitus type 2 without complication, hiatal hernia status post several unsuccessful corrective surgeries, dysphagia with recurrent aspiration pneumonia and previously had a jejunostomy tube which has subsequently been removed, esophageal stricture status post multiple dilations, GERD, arrhythmia with associated syncope status post pacemaker insertion, stroke, recurrent pneumonia with bacteremia, hypothyroidism who presented with 2 week history of cough and fall.  She is from SNF and has had progressive decline over last few years. Pt does have Psychiatric dxs w/ behavior issues at times requiring ED visits. Pt does have the chronic ESOPHAGEAL PHASE Dysphagia history of 2-3+ years. Pt has been exeperiencing N/V after minimal oral intake  per report, and pt's report. ANY Esophageal phase dysphagia w/ Regurgitation can increase risk for the aspiration of REFLUX/vomit material thus negatively impacting Pulmonary status.    Assessment / Plan / Recommendation Clinical Impression  Pt appears at baseline w/ no overt oropharyngeal phase swallowing deficits noted w/ oral intake; although, pt only took a few trials d/t feelings of N/V (NSG recently gave medicine for such). Pt consumed po trials of thin liquids and purees w/ no overt s/s of aspiration noted; no decline in respiratory status or change in vocal quality assessed immediate post trials. Oral phase appeared wfl for bolus management and oral clearing. No immediate issues noted post trials of thin liquids, however, after few trials of puree w/ medicine, pt experienced feelings of N/V and spit up. She declined any further food trials and only accepted a few trials of thin liquids stating "I don't want to throw up again". Suspect pt could be experiencing ESOPHAGEAL PHASE dysphagia w/ regurgitation episode noted this morning. Issues of the ESOPHAGEAL PHASE can/will impact the oropharyngeal phases of swallowing and moreso the Desire for eating/drinking overall. Recommend f/u w/ GI for any further assessment of the ESOPHAGUS d/t pt's extensive h/o GI issues. Recommend ST services f/u w/ modifying diet consistency some and toleration of such diet; education on aspiration and REFLUX precautions. Recommend continue education and modified diet at discharge if helpful for pt; pills in puree for easier Esophageal phase clearing. MD and NSG updated; Palliative Care consult requested. Pt has had such services including a Hospice referral and f/u in past, per chart notes.  SLP Visit Diagnosis: Dysphagia, pharyngoesophageal phase (  R13.14);Dysphagia, unspecified (R13.10) (ESOPHAGEAL PHASE dysphagia)    Aspiration Risk  Mild aspiration risk (or regurgitated material; REFLUX)    Diet Recommendation  Dysphagia  level 2 (minced) w/ Thin liquids; aspiration precautions; Strict REFLUX precautions  Medication Administration: Crushed with puree (or whole in puree if tolerats)    Other  Recommendations Recommended Consults: Consider GI evaluation;Consider esophageal assessment (Dietician f/u) Oral Care Recommendations: Oral care BID;Patient independent with oral care;Staff/trained caregiver to provide oral care   Follow up Recommendations Skilled Nursing facility (pt does come from a ALF)      Frequency and Duration  (TBD at discharge)   (TBD at discharge)       Prognosis Prognosis for Safe Diet Advancement: Fair Barriers to Reach Goals: Cognitive deficits;Severity of deficits (GI deficits/issues - chronic)      Swallow Study   General Date of Onset: 10/02/16 HPI: Pt is a 83 y/o female w/ PMH + for schizoaffective disorder w/ schizophrenia requiring multiple visits to the emergency department, inpatient hospitalizations, Alzheimer's dementia, diabetes mellitus type 2 without complication, hiatal hernia status post several unsuccessful corrective surgeries, dysphagia with recurrent aspiration pneumonia and previously had a jejunostomy tube which has subsequently been removed, esophageal stricture status post multiple dilations, GERD, arrhythmia with associated syncope status post pacemaker insertion, stroke, recurrent pneumonia with bacteremia, hypothyroidism who presented with 2 week history of cough and fall.  She is from SNF and has had progressive decline over last few years. Pt does have Psychiatric dxs w/ behavior issues at times requiring ED visits. Pt does have the chronic ESOPHAGEAL PHASE Dysphagia history of 2-3+ years. Pt has been exeperiencing N/V after minimal oral intake per report, and pt's report. ANY Esophageal phase dysphagia w/ Regurgitation can increase risk for the aspiration of REFLUX/vomit material thus negatively impacting Pulmonary status.  Type of Study: Bedside Swallow  Evaluation Previous Swallow Assessment: 2016 Diet Prior to this Study: Dysphagia 1 (puree);Thin liquids ((in 2016), but pt did not like the recommended diet then) Temperature Spikes Noted: No (wbc 11.5) Respiratory Status: Nasal cannula (1-3 liters) History of Recent Intubation: No Behavior/Cognition: Alert;Cooperative;Pleasant mood;Confused;Distractible;Requires cueing Oral Cavity Assessment: Within Functional Limits Oral Care Completed by SLP: Recent completion by staff Oral Cavity - Dentition: Edentulous (baseline) Vision: Functional for self-feeding Self-Feeding Abilities: Able to feed self;Needs assist;Needs set up (overall weakness) Patient Positioning: Upright in bed Baseline Vocal Quality: Normal Volitional Cough: Strong;Congested (min) Volitional Swallow: Able to elicit    Oral/Motor/Sensory Function Overall Oral Motor/Sensory Function: Within functional limits   Ice Chips Ice chips: Not tested   Thin Liquid Thin Liquid: Within functional limits Presentation: Self Fed;Straw (4 trials) Other Comments: pt refused further d/t recent N/V feelings; NSG has given medicine for such    Nectar Thick Nectar Thick Liquid: Not tested   Honey Thick Honey Thick Liquid: Not tested   Puree Puree: Within functional limits Presentation: Spoon (fed; 2 trials (w/ pills)) Other Comments: no overt s/s of aspiration noted by NSG staff   Solid   GO   Solid: Not tested    Functional Assessment Tool Used: clinical judgement Functional Limitations: Swallowing Swallow Current Status (J8841): At least 1 percent but less than 20 percent impaired, limited or restricted (from an oropharyngeal phase standpoint) Swallow Goal Status (Y6063): At least 1 percent but less than 20 percent impaired, limited or restricted (from an oropharyngeal phase standpoint) Swallow Discharge Status [phone]): At least 1 percent but less than 20 percent impaired, limited or restricted (from an oropharyngeal phase  standpoint)  Orinda Kenner, MS, CCC-SLP Kamaile Zachow 10/04/2016,10:47 AM

## 2016-10-04 NOTE — Progress Notes (Signed)
Left message for her daughter Kaliopi Blyden.

## 2016-10-04 NOTE — Care Management (Addendum)
Informed by physical therapy that on room air with exertion, patient's oxygen sats dropped to 81% but recovered into the 90's on 2 liters.  )2 is acute.  Patient does not have a chronic cardiopulmonary diagnosis documented.  At present, physical therapy is recommending can return to [facility] with Home health.  Heads up referral to [place] for Sn and PT. Agency provids home health services for this facility.  Patient will require oxygen wean and or home 02 assessment.  There is concern that patient will have difficulty managing a walker and a portable oxygen tank

## 2016-10-05 ENCOUNTER — Inpatient Hospital Stay (HOSPITAL_COMMUNITY)
Admit: 2016-10-05 | Discharge: 2016-10-05 | Disposition: A | Discharge status: Home / Self Care | Payer: Medicare Other | Attending: Internal Medicine | Admitting: Internal Medicine

## 2016-10-05 DIAGNOSIS — I361 Nonrheumatic tricuspid (valve) insufficiency: Secondary | ICD-10-CM

## 2016-10-05 LAB — CULTURE, BLOOD (ROUTINE X 2)
Special Requests: ADEQUATE
Special Requests: ADEQUATE

## 2016-10-05 LAB — VANCOMYCIN, TROUGH: Vancomycin Tr: 6 ug/mL — ABNORMAL LOW (ref 15–20)

## 2016-10-05 IMAGING — CR RIGHT RIBS AND CHEST - 3+ VIEW
1 series · 3 of 3 positions shown · non-contrast
Comparison: None.

CLINICAL DATA: Fall 5 days ago, pain.

EXAM:
RIGHT RIBS AND CHEST - 3+ VIEW

[Series 1: dxr ribs right unilateral · 0.14mm/px · 3 of 3 slices shown]
[im 1/3]
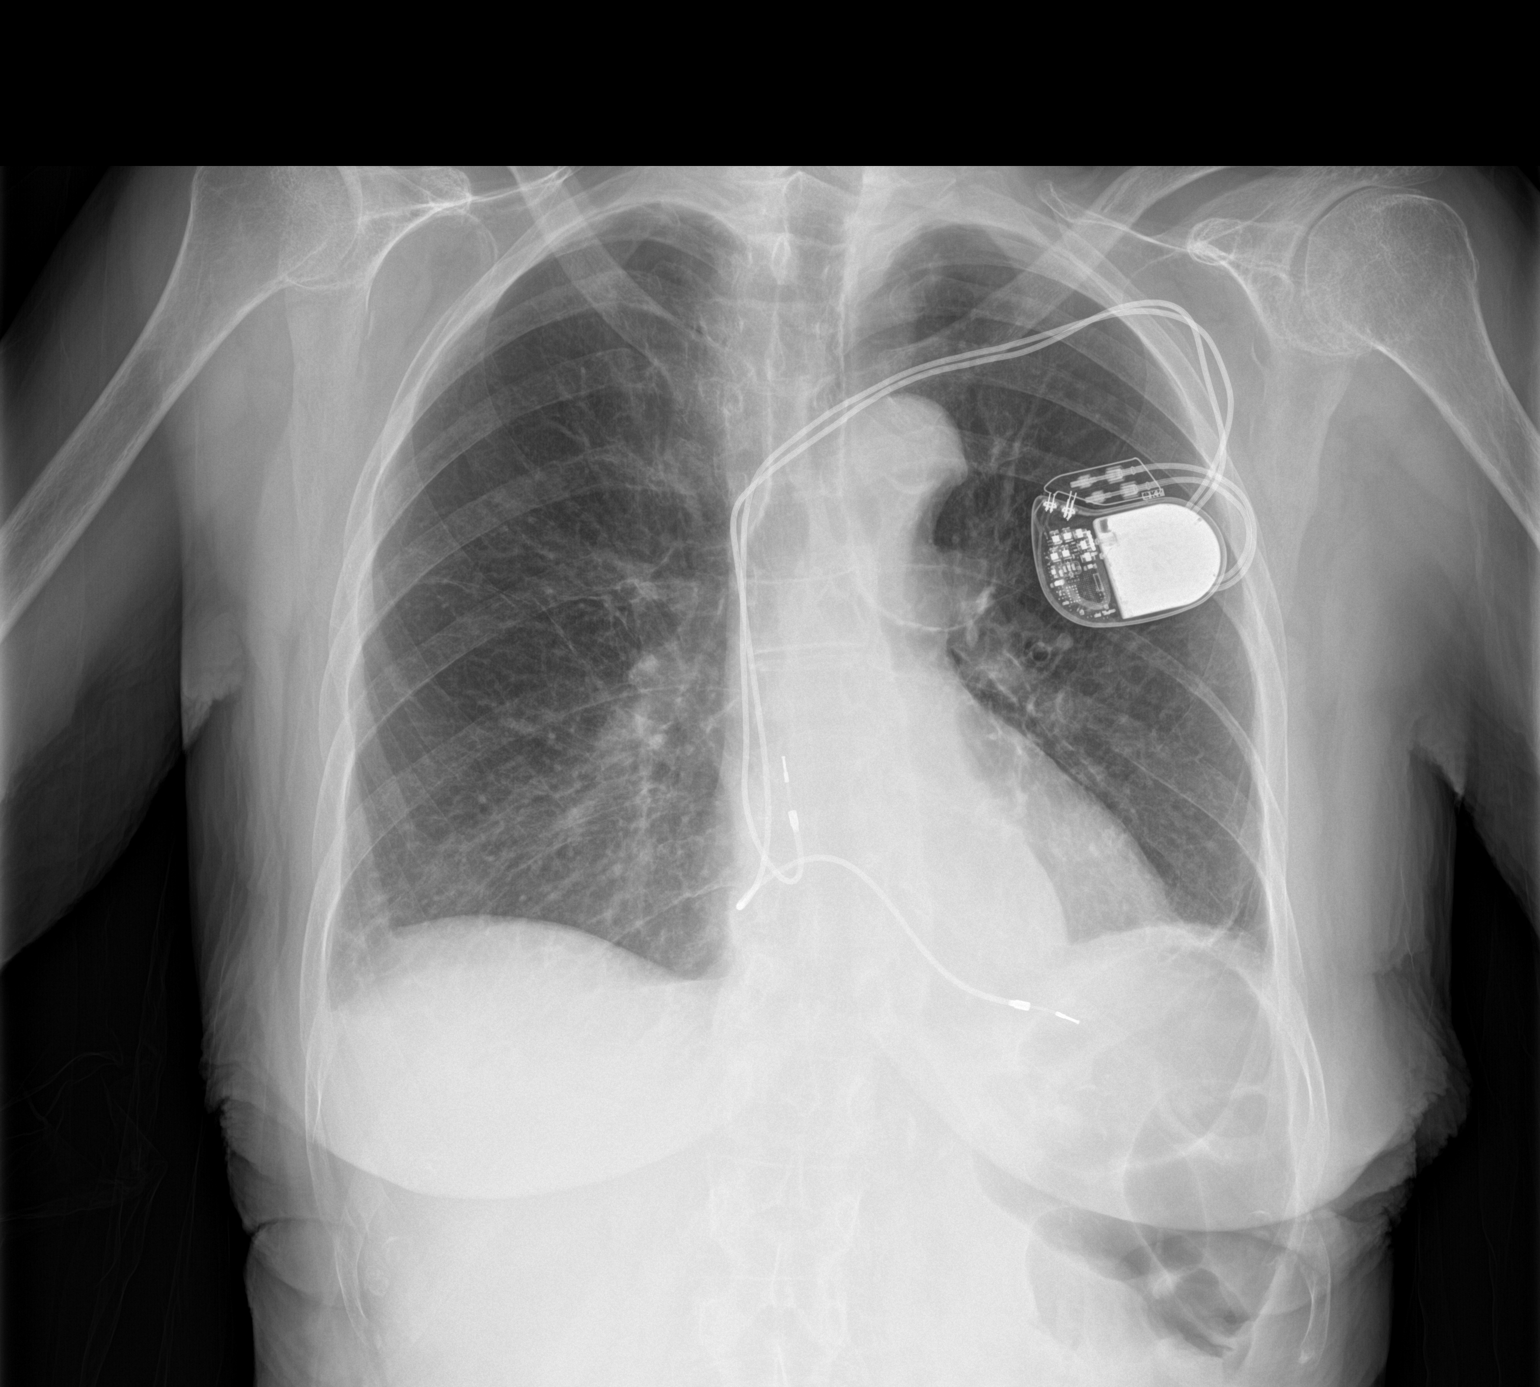
[im 2/3]
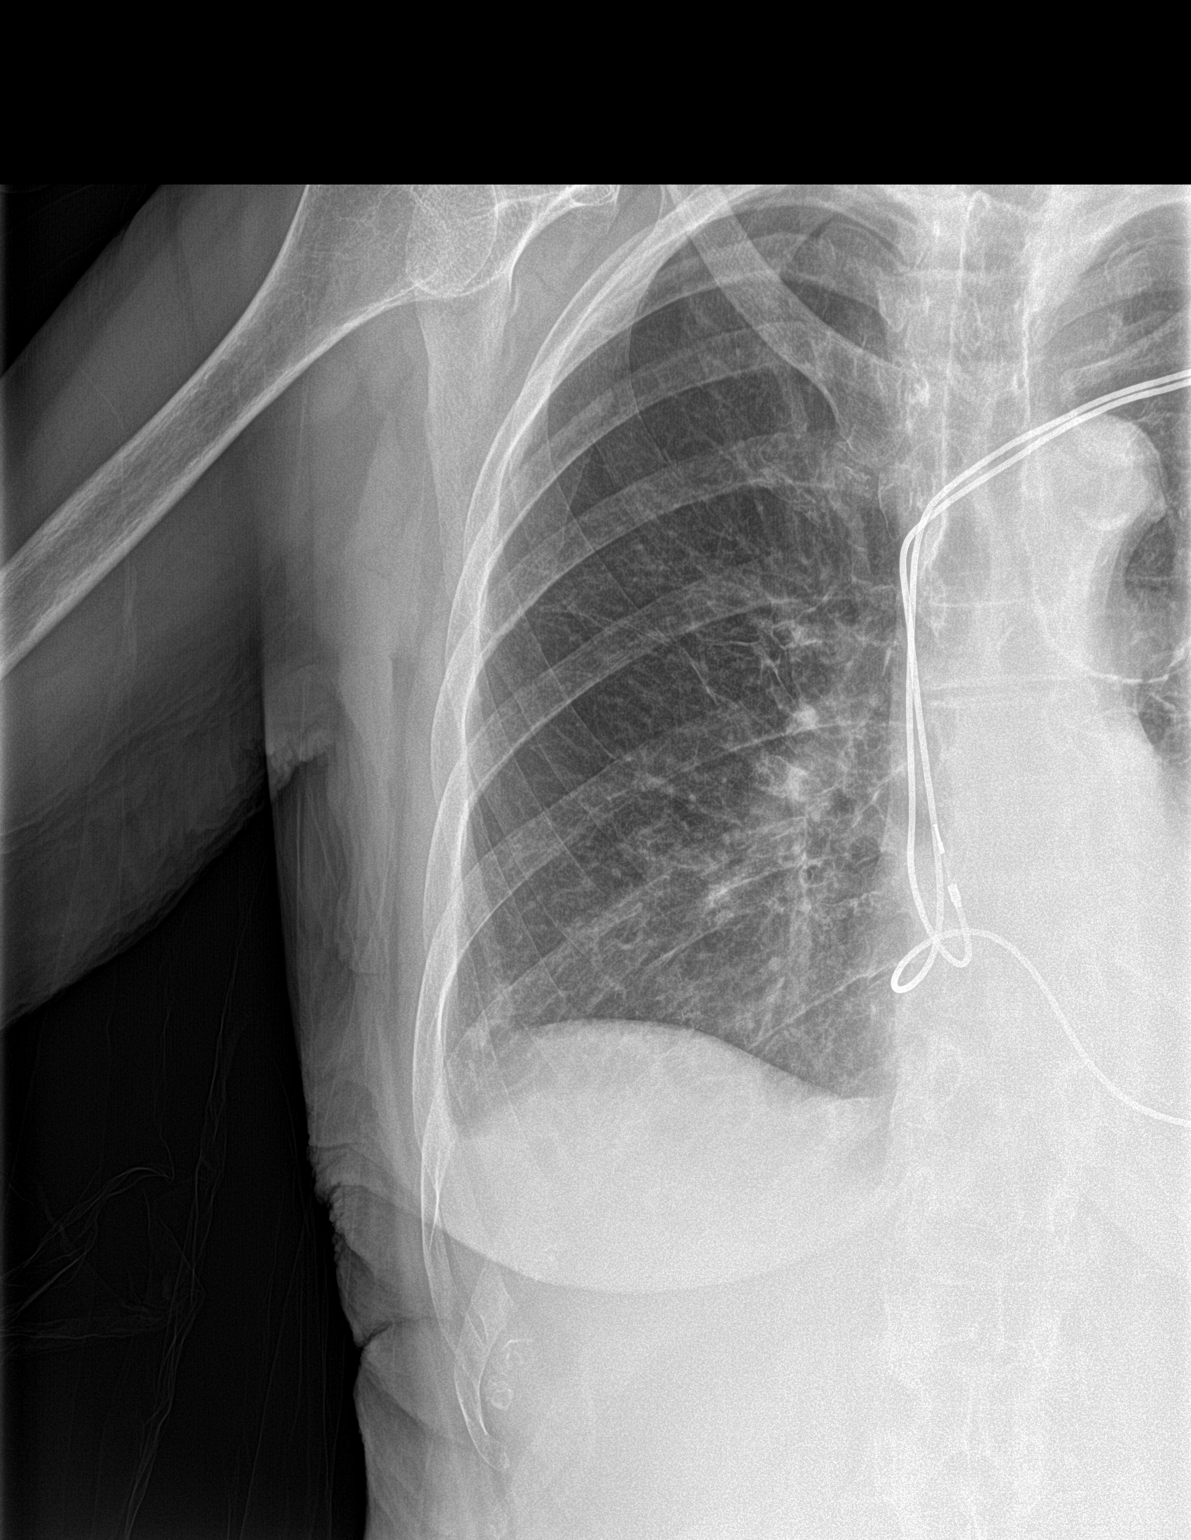
[im 3/3]
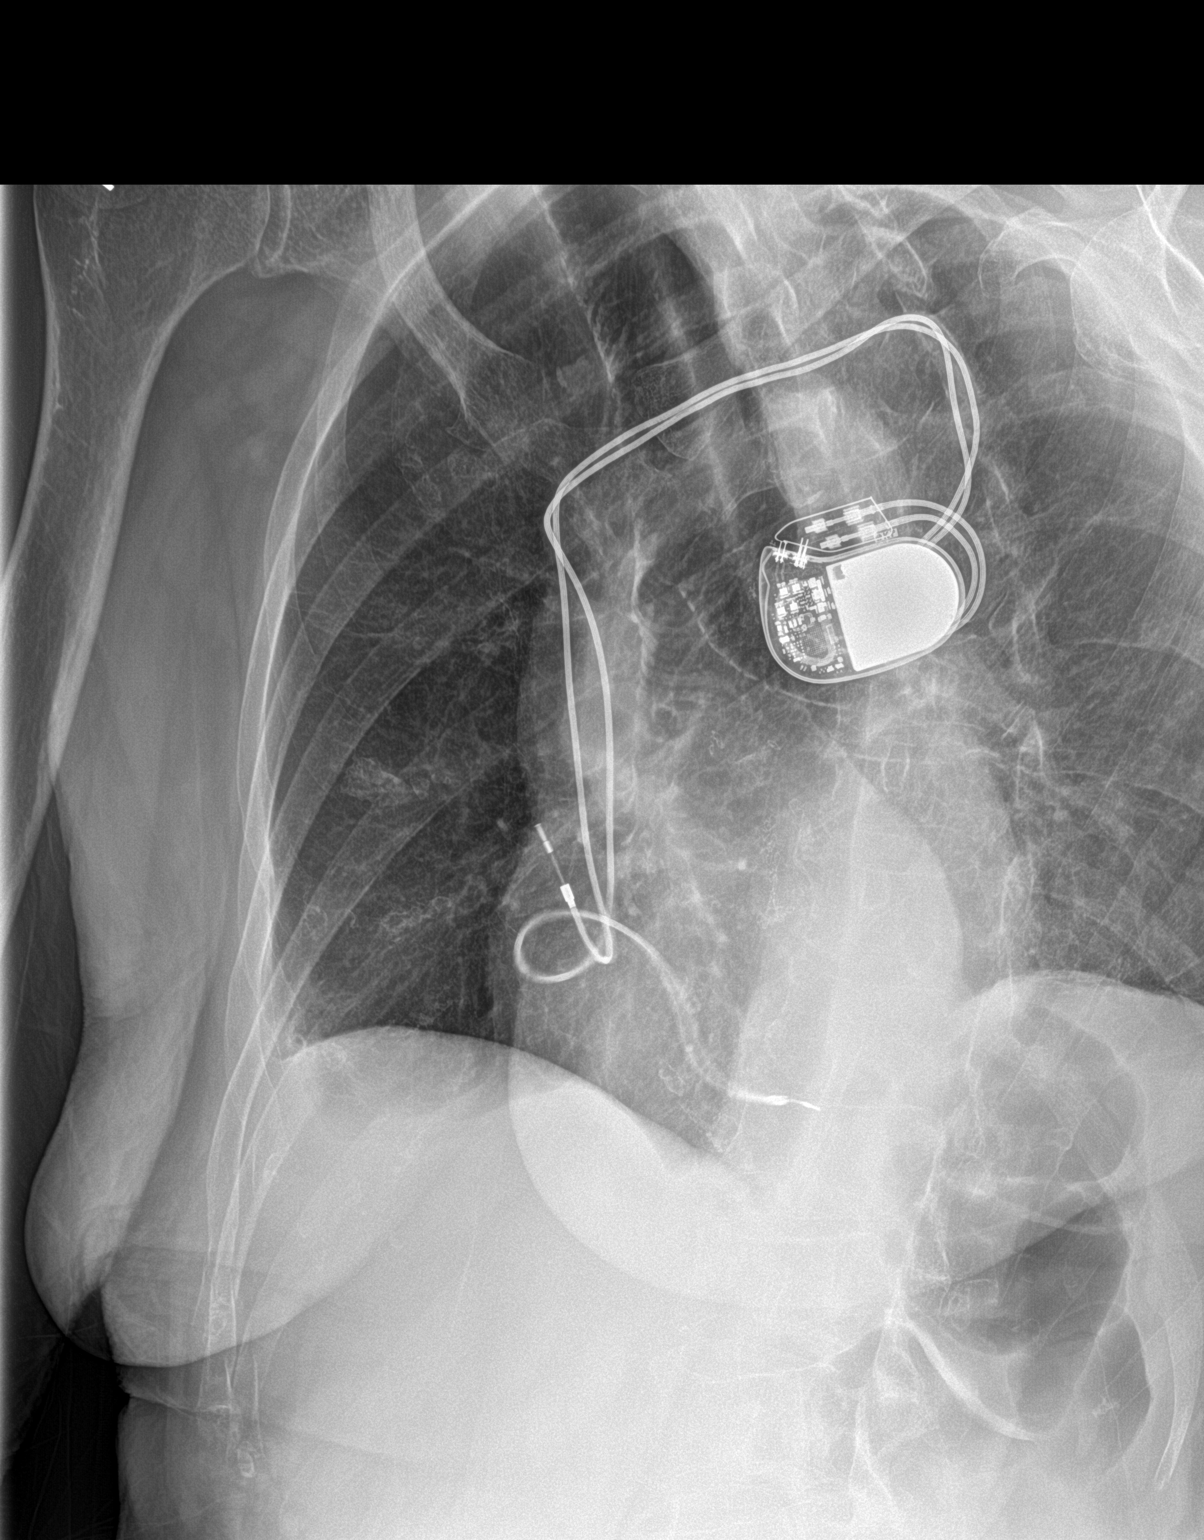

[3 of 3 positions shown; findings below may reference images not displayed]

FINDINGS: Left pacer is in place with leads in the right atrium and right
ventricle. Bibasilar atelectasis or scarring. No effusions. No
pneumothorax. No acute bony abnormality. No visible rib fracture.
IMPRESSION: Bibasilar old linear densities could reflect atelectasis or
scarring. No active disease.

## 2016-10-05 MED ORDER — VANCOMYCIN HCL 500 MG IV SOLR
500.0000 mg | Freq: Two times a day (BID) | INTRAVENOUS | Status: DC
  Filled 2016-10-05 (×2): qty 500

## 2016-10-05 MED ORDER — ENSURE ENLIVE PO LIQD: 237.0000 mL | Freq: Two times a day (BID) | ORAL | 12 refills | Status: DC

## 2016-10-05 MED ORDER — CEFUROXIME AXETIL 500 MG PO TABS: 500.0000 mg | ORAL_TABLET | Freq: Two times a day (BID) | ORAL | 0 refills | Status: DC

## 2016-10-05 MED ORDER — AZITHROMYCIN 250 MG PO TABS: 250.0000 mg | ORAL_TABLET | Freq: Every day | ORAL | 0 refills | Status: DC

## 2016-10-05 MED ORDER — VANCOMYCIN HCL IN DEXTROSE 750-5 MG/150ML-% IV SOLN
750.0000 mg | Freq: Two times a day (BID) | INTRAVENOUS | Status: DC
  Filled 2016-10-05: qty 150

## 2016-10-05 NOTE — Progress Notes (Signed)
PT Cancellation Note  Patient Details Name: Jill Copeland MRN: [id number] DOB: 05-05-1931   Cancelled Treatment:    Reason Eval/Treat Not Completed: Patient declined, no reason specified   Pt in bed and offered session.  Pt declined session despite encouragement prior to discharge.     Chesley Noon 10/05/2016, 9:21 AM

## 2016-10-05 NOTE — Progress Notes (Signed)
*  PRELIMINARY RESULTS* Echocardiogram 2D Echocardiogram has been performed.  Jill Copeland 10/05/2016, 9:57 AM

## 2016-10-05 NOTE — Clinical Social Work Note (Signed)
CSW was informed that patient will be discharging to [facility] today.  CSW spoke to [place] at [facility], and she can accept patient today.  Patient's daughter will transport to ALF.  Jones Broom. [place], MSW, [place]  10/05/2016 12:40 PM

## 2016-10-05 NOTE — Care Management Note (Addendum)
Case Management Note  Patient Details  Name: Jill Copeland MRN: [id number] Date of Birth: 1931-10-27  Subjective/Objective:                 Spoke with patient at bedside. Patient is from [place]. Patient interested in a different facility. Explained to patient that process may take several days, she is ready for DC today and her insurance would not pay for her to stay while they work on finding another ALF. Patient verballized understanding and was agreeable to return to [facility]. Spoke with facility [facility]use [facility]. Referral made for [facility] PT RN. Patient states she has RW. No other CM needs identified.    Action/Plan:  Return to [facility] as facilitated by CSW. Addendum: Patient will DC to [facility] per CSW. [facility] updated.   Expected Discharge Date:  10/05/16               Expected Discharge Plan:  Assisted Living / Rest Home  In-House Referral:     Discharge planning Services  CM Consult  Post Acute Care Choice:  Home Health Choice offered to:  Patient  DME Arranged:    DME Agency:     HH Arranged:  RN, PT [place] Agency:   ([facility])  Status of Service:  Completed, signed off  If discussed at [place] of Stay Meetings, dates discussed:    Additional Comments:  Carles Collet, RN 10/05/2016, 9:27 AM

## 2016-10-05 NOTE — Progress Notes (Signed)
Pt being discharged to [facility], discharge instructions reviewed with pt and daughter,states understanding, pt with no complaints

## 2016-10-05 NOTE — NC FL2 (Signed)
[place] LEVEL OF CARE SCREENING TOOL     IDENTIFICATION  Patient Name: Jill Copeland Birthdate: Apr 03, 1931 Sex: female Admission Date (Current Location): 10/02/2016  [place] and [place] Number:  [name] [id number] [place] and Address:  [facility], [address] [place], [place] [address]      Provider Number: [id number]  Attending Physician Name and Address:  Fritzi Mandes, MD  Relative Name and Phone Number:  Colen Darling Daughter [phone] or Kalie, [place] Daughter [phone]     Current Level of Care: Hospital Recommended Level of Care: [place] Prior Approval Number:    Date Approved/Denied:   PASRR Number: [id number]  Discharge Plan: Domiciliary (Rest home)    Current Diagnoses: Patient Active Problem List   Diagnosis Date Noted  . Sepsis ([place]) 10/02/2016  . Schizoaffective disorder ([place])   . Pressure ulcer 01/13/2015  . Multiple falls 01/12/2015  . Overactive bladder 12/30/2014  . Diabetes mellitus without complication ([place])   . Alzheimer disease   . Schizo affective schizophrenia ([place])   . Recurrent pneumonia   . Dysphagia causing pulmonary aspiration with swallowing   . Pneumococcal pneumonia ([place])   . Arrhythmia   . Esophageal stricture   . Hypothyroidism   . Lung nodule   . Dementia in chronic schizophrenia ([place]) 08/12/2014  . Social discord 08/12/2014  . Family conflict 51/04/5850  . Schizoaffective disorder, unspecified type ([place])     Orientation RESPIRATION BLADDER Height & Weight     Self, Time, Situation  Normal Continent Weight: 121 lb 9.6 oz (55.2 kg) (bed scale) Height:  5\' 3"  (160 cm)  BEHAVIORAL SYMPTOMS/MOOD NEUROLOGICAL BOWEL NUTRITION STATUS      Continent Diet (Dysphagia 2 diet)  AMBULATORY STATUS COMMUNICATION OF NEEDS Skin   Supervision Verbally Normal                       Personal Care Assistance Level of Assistance  Bathing, Feeding, Dressing Bathing  Assistance: Limited assistance Feeding assistance: Independent Dressing Assistance: Limited assistance     Functional Limitations Info  Sight, Hearing, Speech Sight Info: Adequate Hearing Info: Adequate Speech Info: Adequate    SPECIAL CARE FACTORS FREQUENCY  PT (By licensed PT)     PT Frequency: Home Health PT minimum 2x a week              Contractures Contractures Info: Not present    Additional Factors Info  Allergies, Code Status   Psychotropic Code Status Info: Full Code Allergies Info: Penicillins   sertraline (ZOLOFT) tablet 50 mg         Current Medications (10/05/2016):  This is the current hospital active medication list Current Facility-Administered Medications  Medication Dose Route Frequency Provider Last Rate Last Dose  . acetaminophen (TYLENOL) tablet 650 mg  650 mg Oral Q6H PRN Fritzi Mandes, MD   650 mg at 10/02/16 1650  . alum & mag hydroxide-simeth (MAALOX/MYLANTA) 200-200-20 MG/5ML suspension 30 mL  30 mL Oral PRN Fritzi Mandes, MD      . azithromycin [facility]) tablet 250 mg  250 mg Oral Daily Loletha Grayer, MD   250 mg at 10/05/16 0845  . budesonide (PULMICORT) nebulizer solution 0.5 mg  0.5 mg Nebulization BID Loletha Grayer, MD   0.5 mg at 10/05/16 0744  . cefUROXime (CEFTIN) tablet 500 mg  500 mg Oral BID WC Fritzi Mandes, MD   500 mg at 10/05/16 0844  . cholecalciferol (VITAMIN D) tablet 2,000 Units  2,000 Units  Oral Daily Fritzi Mandes, MD   2,000 Units at 10/05/16 [phone]  . divalproex (DEPAKOTE ER) 24 hr tablet 500 mg  500 mg Oral QHS Fritzi Mandes, MD   500 mg at 10/04/16 2114  . docusate sodium (COLACE) capsule 100 mg  100 mg Oral BID Fritzi Mandes, MD   100 mg at 10/05/16 0845  . enoxaparin (LOVENOX) injection 40 mg  40 mg Subcutaneous Q24H Fritzi Mandes, MD   40 mg at 10/04/16 2111  . feeding supplement (ENSURE ENLIVE) (ENSURE ENLIVE) liquid 237 mL  237 mL Oral BID BM Loletha Grayer, MD   237 mL at 10/05/16 0843  . ferrous sulfate tablet 325 mg   325 mg Oral Q breakfast Fritzi Mandes, MD   325 mg at 10/05/16 0845  . fluticasone (FLONASE) 50 MCG/ACT nasal spray 2 spray  2 spray Each Nare Daily Fritzi Mandes, MD   2 spray at 10/05/16 0844  . furosemide (LASIX) tablet 20 mg  20 mg Oral Cherylynn Ridges, MD   20 mg at 10/05/16 0845  . gabapentin (NEURONTIN) capsule 200 mg  200 mg Oral TID Fritzi Mandes, MD   200 mg at 10/05/16 0844  . guaifenesin (ROBITUSSIN) 100 MG/5ML syrup 200 mg  200 mg Oral QID PRN Fritzi Mandes, MD      . ipratropium-albuterol (DUONEB) 0.5-2.5 (3) MG/3ML nebulizer solution 3 mL  3 mL Nebulization TID Loletha Grayer, MD   3 mL at 10/05/16 0743  . levothyroxine (SYNTHROID, LEVOTHROID) tablet 125 mcg  125 mcg Oral QAC breakfast Fritzi Mandes, MD   125 mcg at 10/05/16 0602  . loperamide (IMODIUM) capsule 2 mg  2 mg Oral PRN Fritzi Mandes, MD      . loratadine (CLARITIN) tablet 10 mg  10 mg Oral Daily Fritzi Mandes, MD   10 mg at 10/05/16 0845  . magnesium hydroxide (MILK OF MAGNESIA) suspension 30 mL  30 mL Oral Daily PRN Fritzi Mandes, MD      . Melatonin TABS 2.5 mg  2.5 mg Oral QHS Fritzi Mandes, MD   2.5 mg at 10/04/16 2112  . metoprolol tartrate (LOPRESSOR) tablet 12.5 mg  12.5 mg Oral BID Fritzi Mandes, MD   12.5 mg at 10/05/16 0844  . ondansetron (ZOFRAN) tablet 4 mg  4 mg Oral Q6H PRN Fritzi Mandes, MD       Or  . ondansetron [facility]) injection 4 mg  4 mg Intravenous Q6H PRN Fritzi Mandes, MD   4 mg at 10/04/16 0847  . predniSONE (DELTASONE) tablet 50 mg  50 mg Oral Q breakfast Fritzi Mandes, MD   50 mg at 10/05/16 0845  . rivastigmine (EXELON) 4.6 mg/24hr 4.6 mg  4.6 mg Transdermal Daily Fritzi Mandes, MD   4.6 mg at 10/05/16 0845  . sertraline (ZOLOFT) tablet 50 mg  50 mg Oral QHS Fritzi Mandes, MD   50 mg at 10/04/16 2113  . sodium chloride tablet 1 g  1 g Oral TID Fritzi Mandes, MD   1 g at 10/05/16 0845  . traMADol (ULTRAM) tablet 50 mg  50 mg Oral BID PRN Fritzi Mandes, MD         Discharge Medications: Please see discharge summary for  a list of discharge medications.  Current Discharge Medication List        START taking these medications   Details  azithromycin (ZITHROMAX) 250 MG tablet Take 1 tablet (250 mg total) by mouth daily. Qty: 4 tablet, Refills: 0    cefUROXime (CEFTIN)  500 MG tablet Take 1 tablet (500 mg total) by mouth 2 (two) times daily with a meal. Qty: 14 tablet, Refills: 0    feeding supplement, ENSURE ENLIVE, (ENSURE ENLIVE) LIQD Take 237 mLs by mouth 2 (two) times daily between meals. Qty: 237 mL, Refills: 12          CONTINUE these medications which have NOT CHANGED   Details  acetaminophen (TYLENOL) 500 MG tablet Take 500 mg by mouth every 4 (four) hours as needed.    alum & mag hydroxide-simeth (MINTOX) 200-200-20 MG/5ML suspension Take 30 mLs by mouth as needed for indigestion or heartburn.    cetirizine (ZYRTEC) 10 MG tablet Take 10 mg by mouth daily.    Cholecalciferol 2000 units TABS Take 2,000 Units by mouth daily.    divalproex (DEPAKOTE ER) 500 MG 24 hr tablet Take 500 mg by mouth at bedtime.    docusate sodium (COLACE) 100 MG capsule Take 100 mg by mouth 2 (two) times daily.    ferrous sulfate 325 (65 FE) MG tablet Take 325 mg by mouth daily with breakfast.    fluticasone (FLONASE) 50 MCG/ACT nasal spray Place 2 sprays into both nostrils daily.    furosemide (LASIX) 20 MG tablet Take 20 mg by mouth every other day.    gabapentin (NEURONTIN) 100 MG capsule Take 200 mg by mouth 3 (three) times daily.    guaifenesin (ROBITUSSIN) 100 MG/5ML syrup Take 200 mg by mouth 4 (four) times daily as needed for cough.    levothyroxine (SYNTHROID, LEVOTHROID) 125 MCG tablet Take 125 mcg by mouth daily before breakfast.     loperamide (IMODIUM) 2 MG capsule Take 2 mg by mouth as needed for diarrhea or loose stools.    magnesium hydroxide (MILK OF MAGNESIA) 400 MG/5ML suspension Take 30 mLs by mouth daily as needed for mild constipation.    Melatonin 3 MG TABS Take  3 mg by mouth at bedtime.    metoprolol tartrate (LOPRESSOR) 25 MG tablet Take 12.5 mg by mouth 2 (two) times daily.    neomycin-bacitracin-polymyxin (NEOSPORIN) ointment Apply 1 application topically as needed for wound care. apply to eye    ondansetron (ZOFRAN) 8 MG tablet Take 8 mg by mouth every 8 (eight) hours as needed for nausea or vomiting.    potassium chloride (K-DUR,KLOR-CON) 10 MEQ tablet Take 10 mEq by mouth every other day.    rivastigmine (EXELON) 4.6 mg/24hr Place 4.6 mg onto the skin daily.    saccharomyces boulardii (FLORASTOR) 250 MG capsule Take 1 capsule (250 mg total) by mouth 2 (two) times daily. Qty: 30 capsule, Refills: 0    senna (SENOKOT) 8.6 MG TABS tablet Take 1 tablet by mouth 2 (two) times a week. Mondays and Fridays    sertraline (ZOLOFT) 50 MG tablet Take 50 mg by mouth at bedtime.    sodium chloride 1 G tablet Take 1 g by mouth 3 (three) times daily.    traMADol (ULTRAM) 50 MG tablet Take 1 tablet (50 mg total) by mouth 2 (two) times daily as needed for moderate pain. Qty: 30 tablet, Refills: 0         STOP taking these medications     hydrochlorothiazide (HYDRODIURIL) 12.5 MG tablet      levofloxacin (LEVAQUIN) 250 MG tablet          Relevant Imaging Results:  Relevant Lab Results:   Additional Information SSN [id number]  Ross Ludwig, [place]

## 2016-10-05 NOTE — Discharge Summary (Addendum)
[place] [facility] [place] NAME: Jill Copeland    MR#:  [id number]  DATE OF BIRTH:  1932/02/12  DATE OF ADMISSION:  10/02/2016 ADMITTING PHYSICIAN: Fritzi Mandes, MD  DATE OF DISCHARGE: 10/05/2016  PRIMARY CARE PHYSICIAN: Marco Collie, MD    ADMISSION DIAGNOSIS:  Acute respiratory failure with hypoxia ([place]) [J96.01] HCAP (healthcare-associated pneumonia) [J18.9] Severe sepsis ([place]) [A41.9, R65.20]  DISCHARGE DIAGNOSIS:  Acute respiraotry failure due to Bibasilar pneumonia Staph Hominis Sepsis Hypokalemia  SECONDARY DIAGNOSIS:   Past Medical History:  Diagnosis Date  . Alzheimer disease   . Arrhythmia    with syncope s/p PPM  . Arthritis   . Bacteremia    several timesf  . Diabetes mellitus without complication ([place])   . Dysphagia causing pulmonary aspiration with swallowing   . Esophageal stricture   . GERD (gastroesophageal reflux disease)   . Hiatal hernia    s/p repair with fistula  . Hypothyroidism   . Pneumococcal pneumonia ([place])   . Recurrent pneumonia   . Recurrent UTI   . Schizo affective schizophrenia ([place])   . Stroke (cerebrum) [facility])     HOSPITAL COURSE:  Jill Copeland a 83 y.o. femalewith a known history of Chronic dysphagia causing aspiration pneumonia in the past, history of esophageal stricture status post dilation shin in the past, Alzheimer's dementia, bipolar disorder comes to the emergency room from [place] after she started having increasing shortness of breath cough and fever  1. Sepsis due to multifocal pneumonia -Continue IV vancomycin, zithromax and cefepime---pharmacy to dose -MRSA PCR negative  -Repeat Blood cultures drawn today- blood cultures positive for  Staph hominis---changed to ceftin and zithromax. (total 14 days) D/c vanc. D/w pharmacy and curbsided ID. -will get TTE given positive BC and h/o pacemaker. W/f fever and elevated wbc.  -appreciated speech evaluation -Patient  normally takes mechanical soft diet at [facility] as needed -Incentive spirometer -sats 95% on RA  2. Lactic acidosis and leukocytosis due to #1 -improved  3. Hypokalemia replete with Oral  4. Hypothyroidism continue Synthroid  5. History of schizoaffective schizophrenia and Alzheimer's dementia -Continue her psych meds  6. DVT prophylaxis subcutaneous Lovenox  7. PT recommends HHPT  Left message for Lattie Haw dter D/c today  Case discussed with Care Management/Social Worker. Management plans discussed with the patient, family and they are in agreement.  CODE STATUS: full  CONSULTS OBTAINED:    DRUG ALLERGIES:   Allergen  . Penicillins       DISCHARGE MEDICATIONS:   Current Discharge Medication List    START taking these medications   Details  azithromycin (ZITHROMAX) 250 MG tablet Take 1 tablet (250 mg total) by mouth daily. Qty: 4 tablet, Refills: 0    cefUROXime (CEFTIN) 500 MG tablet Take 1 tablet (500 mg total) by mouth 2 (two) times daily with a meal. Qty: 14 tablet, Refills: 0    feeding supplement, ENSURE ENLIVE, (ENSURE ENLIVE) LIQD Take 237 mLs by mouth 2 (two) times daily between meals. Qty: 237 mL, Refills: 12      CONTINUE these medications which have NOT CHANGED   Details  acetaminophen (TYLENOL) 500 MG tablet Take 500 mg by mouth every 4 (four) hours as needed.    alum & mag hydroxide-simeth (MINTOX) 200-200-20 MG/5ML suspension Take 30 mLs by mouth as needed for indigestion or heartburn.    cetirizine (ZYRTEC) 10 MG tablet Take 10 mg by mouth daily.    Cholecalciferol 2000 units  TABS Take 2,000 Units by mouth daily.    divalproex (DEPAKOTE ER) 500 MG 24 hr tablet Take 500 mg by mouth at bedtime.    docusate sodium (COLACE) 100 MG capsule Take 100 mg by mouth 2 (two) times daily.    ferrous sulfate 325 (65 FE) MG tablet Take 325 mg by mouth daily with breakfast.    fluticasone (FLONASE) 50 MCG/ACT nasal spray Place 2  sprays into both nostrils daily.    furosemide (LASIX) 20 MG tablet Take 20 mg by mouth every other day.    gabapentin (NEURONTIN) 100 MG capsule Take 200 mg by mouth 3 (three) times daily.    guaifenesin (ROBITUSSIN) 100 MG/5ML syrup Take 200 mg by mouth 4 (four) times daily as needed for cough.    levothyroxine (SYNTHROID, LEVOTHROID) 125 MCG tablet Take 125 mcg by mouth daily before breakfast.     loperamide (IMODIUM) 2 MG capsule Take 2 mg by mouth as needed for diarrhea or loose stools.    magnesium hydroxide (MILK OF MAGNESIA) 400 MG/5ML suspension Take 30 mLs by mouth daily as needed for mild constipation.    Melatonin 3 MG TABS Take 3 mg by mouth at bedtime.    metoprolol tartrate (LOPRESSOR) 25 MG tablet Take 12.5 mg by mouth 2 (two) times daily.    neomycin-bacitracin-polymyxin (NEOSPORIN) ointment Apply 1 application topically as needed for wound care. apply to eye    ondansetron (ZOFRAN) 8 MG tablet Take 8 mg by mouth every 8 (eight) hours as needed for nausea or vomiting.    potassium chloride (K-DUR,KLOR-CON) 10 MEQ tablet Take 10 mEq by mouth every other day.    rivastigmine (EXELON) 4.6 mg/24hr Place 4.6 mg onto the skin daily.    saccharomyces boulardii (FLORASTOR) 250 MG capsule Take 1 capsule (250 mg total) by mouth 2 (two) times daily. Qty: 30 capsule, Refills: 0    senna (SENOKOT) 8.6 MG TABS tablet Take 1 tablet by mouth 2 (two) times a week. Mondays and Fridays    sertraline (ZOLOFT) 50 MG tablet Take 50 mg by mouth at bedtime.    sodium chloride 1 G tablet Take 1 g by mouth 3 (three) times daily.    traMADol (ULTRAM) 50 MG tablet Take 1 tablet (50 mg total) by mouth 2 (two) times daily as needed for moderate pain. Qty: 30 tablet, Refills: 0      STOP taking these medications     hydrochlorothiazide (HYDRODIURIL) 12.5 MG tablet      levofloxacin (LEVAQUIN) 250 MG tablet         If you experience worsening of your admission symptoms, develop  shortness of breath, life threatening emergency, suicidal or homicidal thoughts you must seek medical attention immediately by calling 911 or calling your MD immediately  if symptoms less severe.  You Must read complete instructions/literature along with all the possible adverse reactions/side effects for all the Medicines you take and that have been prescribed to you. Take any new Medicines after you have completely understood and accept all the possible adverse reactions/side effects.   Please note  You were cared for by a hospitalist during your hospital stay. If you have any questions about your discharge medications or the care you received while you were in the hospital after you are discharged, you can call the unit and asked to speak with the hospitalist on call if the hospitalist that took care of you is not available. Once you are discharged, your primary care physician will handle  any further medical issues. Please note that NO REFILLS for any discharge medications will be authorized once you are discharged, as it is imperative that you return to your primary care physician (or establish a relationship with a primary care physician if you do not have one) for your aftercare needs so that they can reassess your need for medications and monitor your lab values. Today   SUBJECTIVE    Eating BF VITAL SIGNS:  Blood pressure (!) 179/74, pulse 65, temperature 98 F (36.7 C), temperature source Oral, resp. rate 16, height 5\' 3"  (1.6 m), weight 55.2 kg (121 lb 9.6 oz), SpO2 93 %.  I/O:   Intake/Output Summary (Last 24 hours) at 10/05/16 0832 Last data filed at 10/04/16 1854  Gross per 24 hour  Intake              480 ml  Output                0 ml  Net              480 ml    PHYSICAL EXAMINATION:  GENERAL:  83 y.o.-year-old patient lying in the bed with no acute distress.  EYES: Pupils equal, round, reactive to light and accommodation. No scleral icterus. Extraocular muscles intact.   HEENT: Head atraumatic, normocephalic. Oropharynx and nasopharynx clear.  NECK:  Supple, no jugular venous distention. No thyroid enlargement, no tenderness.  LUNGS: Normal breath sounds bilaterally, no wheezing, rales,rhonchi or crepitation. No use of accessory muscles of respiration.  CARDIOVASCULAR: S1, S2 normal. No murmurs, rubs, or gallops.  ABDOMEN: Soft, non-tender, non-distended. Bowel sounds present. No organomegaly or mass.  EXTREMITIES: No pedal edema, cyanosis, or clubbing.  NEUROLOGIC: Cranial nerves II through XII are intact. Muscle strength 5/5 in all extremities. Sensation intact. Gait not checked.  PSYCHIATRIC: The patient is alert and oriented x 3.  SKIN: No obvious rash, lesion, or ulcer.   DATA REVIEW:   CBC   Recent Labs Lab 10/02/16 1227  WBC 11.5*  HGB 14.5  HCT 43.4  PLT 193    Chemistries   Recent Labs Lab 10/02/16 1227  10/04/16 0414  NA 134*  --  137  K 2.7*  < > 3.4*  CL 90*  --  98*  CO2 31  --  32  GLUCOSE 136*  --  161*  BUN 19  --  11  CREATININE 0.74  --  0.58  CALCIUM 9.1  --  8.7*  MG  --   < > 1.8  AST 44*  --   --   ALT 24  --   --   ALKPHOS 96  --   --   BILITOT 0.9  --   --   < > = values in this interval not displayed.  Microbiology Results   Recent Results (from the past 240 hour(s))  Culture, blood (Routine x 2)     Status: Abnormal   Collection Time: 10/02/16 12:27 PM  Result Value Ref Range Status   Specimen Description BLOOD BLOOD LEFT WRIST  Final   Special Requests   Final    BOTTLES DRAWN AEROBIC AND ANAEROBIC Blood Culture adequate volume   Culture  Setup Time   Final    AEROBIC BOTTLE ONLY GRAM POSITIVE COCCI CRITICAL VALUE NOTED.  VALUE IS CONSISTENT WITH PREVIOUSLY REPORTED AND CALLED VALUE. [facility]    Culture (A)  Final    STAPHYLOCOCCUS HOMINIS SUSCEPTIBILITIES PERFORMED ON PREVIOUS CULTURE WITHIN THE LAST 5 DAYS.  Performed at [facility], [place] [address] [place], [place] [address]    Report  Status 10/05/2016 FINAL  Final  Culture, blood (Routine x 2)     Status: Abnormal   Collection Time: 10/02/16 12:27 PM  Result Value Ref Range Status   Specimen Description BLOOD RIGHT ANTECUBITAL  Final   Special Requests   Final    BOTTLES DRAWN AEROBIC AND ANAEROBIC Blood Culture adequate volume   Culture  Setup Time   Final    AEROBIC BOTTLE ONLY GRAM POSITIVE COCCI CRITICAL RESULT CALLED TO, READ BACK BY AND VERIFIED WITH: HANK ZOMPA ON 10/03/16 AT 1334 [facility]    Culture STAPHYLOCOCCUS HOMINIS (A)  Final   Report Status 10/05/2016 FINAL  Final   Organism ID, Bacteria STAPHYLOCOCCUS HOMINIS  Final      Susceptibility   Staphylococcus hominis - MIC*    CIPROFLOXACIN <=0.5 SENSITIVE Sensitive     ERYTHROMYCIN >=8 RESISTANT Resistant     GENTAMICIN <=0.5 SENSITIVE Sensitive     OXACILLIN <=0.25 SENSITIVE Sensitive     TETRACYCLINE <=1 SENSITIVE Sensitive     VANCOMYCIN <=0.5 SENSITIVE Sensitive     TRIMETH/SULFA <=10 SENSITIVE Sensitive     CLINDAMYCIN <=0.25 SENSITIVE Sensitive     RIFAMPIN <=0.5 SENSITIVE Sensitive     Inducible Clindamycin NEGATIVE Sensitive     * STAPHYLOCOCCUS HOMINIS  Urine culture     Status: Abnormal   Collection Time: 10/02/16 12:27 PM  Result Value Ref Range Status   Specimen Description URINE, RANDOM  Final   Special Requests NONE  Final   Culture (A)  Final    <10,000 COLONIES/mL INSIGNIFICANT GROWTH Performed at [facility], [address], [place], [place] [address]    Report Status 10/03/2016 FINAL  Final  Blood Culture ID Panel (Reflexed)     Status: Abnormal   Collection Time: 10/02/16 12:27 PM  Result Value Ref Range Status   Enterococcus species NOT DETECTED NOT DETECTED Final   Listeria monocytogenes NOT DETECTED NOT DETECTED Final   Staphylococcus species DETECTED (A) NOT DETECTED Final    Comment: Methicillin (oxacillin) susceptible coagulase negative staphylococcus. Possible blood culture contaminant (unless isolated from more  than one blood culture draw or clinical case suggests pathogenicity). No antibiotic treatment is indicated for blood  culture contaminants. CRITICAL RESULT CALLED TO, READ BACK BY AND VERIFIED WITH: HANK ZOMPA ON 10/03/16 AT 1334 [place]    Staphylococcus aureus NOT DETECTED NOT DETECTED Final   Methicillin resistance NOT DETECTED NOT DETECTED Final   Streptococcus species NOT DETECTED NOT DETECTED Final   Streptococcus agalactiae NOT DETECTED NOT DETECTED Final   Streptococcus pneumoniae NOT DETECTED NOT DETECTED Final   Streptococcus pyogenes NOT DETECTED NOT DETECTED Final   Acinetobacter baumannii NOT DETECTED NOT DETECTED Final   Enterobacteriaceae species NOT DETECTED NOT DETECTED Final   Enterobacter cloacae complex NOT DETECTED NOT DETECTED Final   Escherichia coli NOT DETECTED NOT DETECTED Final   Klebsiella oxytoca NOT DETECTED NOT DETECTED Final   Klebsiella pneumoniae NOT DETECTED NOT DETECTED Final   Proteus species NOT DETECTED NOT DETECTED Final   Serratia marcescens NOT DETECTED NOT DETECTED Final   Haemophilus influenzae NOT DETECTED NOT DETECTED Final   Neisseria meningitidis NOT DETECTED NOT DETECTED Final   Pseudomonas aeruginosa NOT DETECTED NOT DETECTED Final   Candida albicans NOT DETECTED NOT DETECTED Final   Candida glabrata NOT DETECTED NOT DETECTED Final   Candida krusei NOT DETECTED NOT DETECTED Final   Candida  parapsilosis NOT DETECTED NOT DETECTED Final   Candida tropicalis NOT DETECTED NOT DETECTED Final  MRSA PCR Screening     Status: None   Collection Time: 10/02/16  6:22 PM  Result Value Ref Range Status   MRSA by PCR NEGATIVE NEGATIVE Final    Comment:        The GeneXpert MRSA Assay (FDA approved for NASAL specimens only), is one component of a comprehensive MRSA colonization surveillance program. It is not intended to diagnose MRSA infection nor to guide or monitor treatment for MRSA infections.     RADIOLOGY:  No results  found.   Management plans discussed with the patient, family and they are in agreement.  CODE STATUS:     Code Status Orders        Start     Ordered   10/02/16 1627  Full code  Continuous     10/02/16 1627    Code Status History    Date Active Date Inactive Code Status Order ID Comments User Context   01/12/2015 10:33 PM 01/15/2015 12:09 AM Full Code [id number]  Lytle Butte, MD ED   12/30/2014  4:37 PM 01/02/2015  9:00 PM DNR [id number]  Erick Colace, NP Inpatient   12/30/2014 11:53 AM 12/30/2014  4:34 PM Full Code [id number]  Janece Canterbury, MD Inpatient    Advance Directive Documentation     Most Recent Value  Type of Advance Directive  Healthcare Power of Attorney  Pre-existing out of facility DNR order (yellow form or pink MOST form)  -  "MOST" Form in Place?  -      TOTAL TIME TAKING CARE OF THIS PATIENT: *40* minutes.    Natika Geyer M.D on 10/05/2016 at 8:32 AM  Between 7am to 6pm - Pager - [phone] After 6pm go to [email or web address] - password [email or web address]  Office  [phone]  CC: Primary care physician; Marco Collie, MD

## 2016-10-05 NOTE — Progress Notes (Signed)
Pharmacy Antibiotic Note  Jill Copeland is a 83 y.o. female admitted on 10/02/2016 with  sepsis.  Pharmacy has been consulted for Vancomycin and cefepime dosing. Patient received vancomycin 1g IV and cefepime 2g IV x 1 dose in ED.   Patient has PCN allergy. According to patient's daughter patient developed a rash after taking a PCN when she was younger.   Plan: Ke: 0.041   Vd: 37.1   T1/2: 16.9    Will start patient on Vancomycin 750mg  IV every 24 hours with 11 stack dosing. Calculated trough at Css is 15. Trough ordered prior to 4th dose. Will monitor renal function and adjust dose as needed.   7/17 @ 2342 VT 6 subtherpeutic drawn appropriately. Will increase dose to vanc 500 mg q12h and recheck VT 7/18 @ 1100 prior to 4th dose. Css 15 mcg/mL.  Will start patient on cefepime 1g IV every 12 hour based on current CrCl <81ml/min.   Height: 5\' 3"  (160 cm) Weight: 121 lb 9.6 oz (55.2 kg) (bed scale) IBW/kg (Calculated) : 52.4  Temp (24hrs), Avg:98.1 F (36.7 C), Min:97.7 F (36.5 C), Max:98.7 F (37.1 C)   Recent Labs Lab 10/02/16 1227 10/02/16 1518 10/04/16 0414 10/04/16 2342  WBC 11.5*  --   --   --   CREATININE 0.74  --  0.58  --   LATICACIDVEN 2.0* 2.4*  --   --   VANCOTROUGH  --   --   --  6*    Estimated Creatinine Clearance: 44.1 mL/min (by C-G formula based on SCr of 0.58 mg/dL).     Antimicrobials this admission: 7/14 vancomycin >>  7/14 cefepime >>   Dose adjustments this admission:  Microbiology results: 7/14 BCx: pending 7/14 UCx: sent  7/14 MRSA PCR: pending  Thank you for allowing pharmacy to be a part of this patient's care.  Tobie Lords, PharmD, BCPS Clinical Pharmacist 10/05/2016 1:37 AM

## 2016-10-05 NOTE — Progress Notes (Signed)
Daughter transporting pt

## 2016-10-09 ENCOUNTER — Encounter: Payer: Self-pay | Admitting: Emergency Medicine

## 2016-10-09 ENCOUNTER — Inpatient Hospital Stay
Admission: EM | Admit: 2016-10-09 | Discharge: 2016-10-14 | DRG: 314 | Disposition: A | Discharge status: Skilled Nursing Facility | Payer: Medicare Other | Attending: Internal Medicine | Admitting: Internal Medicine

## 2016-10-09 DIAGNOSIS — R001 Bradycardia, unspecified: Secondary | ICD-10-CM | POA: Diagnosis present

## 2016-10-09 DIAGNOSIS — Z79899 Other long term (current) drug therapy: Secondary | ICD-10-CM | POA: Diagnosis not present

## 2016-10-09 DIAGNOSIS — R7881 Bacteremia: Secondary | ICD-10-CM | POA: Diagnosis present

## 2016-10-09 DIAGNOSIS — E119 Type 2 diabetes mellitus without complications: Secondary | ICD-10-CM | POA: Diagnosis present

## 2016-10-09 DIAGNOSIS — B957 Other staphylococcus as the cause of diseases classified elsewhere: Secondary | ICD-10-CM | POA: Diagnosis present

## 2016-10-09 DIAGNOSIS — J189 Pneumonia, unspecified organism: Secondary | ICD-10-CM | POA: Diagnosis present

## 2016-10-09 DIAGNOSIS — R296 Repeated falls: Secondary | ICD-10-CM | POA: Diagnosis present

## 2016-10-09 DIAGNOSIS — Z87440 Personal history of urinary (tract) infections: Secondary | ICD-10-CM

## 2016-10-09 DIAGNOSIS — T827XXA Infection and inflammatory reaction due to other cardiac and vascular devices, implants and grafts, initial encounter: Principal | ICD-10-CM | POA: Diagnosis present

## 2016-10-09 DIAGNOSIS — Z888 Allergy status to other drugs, medicaments and biological substances status: Secondary | ICD-10-CM | POA: Diagnosis not present

## 2016-10-09 DIAGNOSIS — F205 Residual schizophrenia: Secondary | ICD-10-CM | POA: Diagnosis present

## 2016-10-09 DIAGNOSIS — F319 Bipolar disorder, unspecified: Secondary | ICD-10-CM | POA: Diagnosis present

## 2016-10-09 DIAGNOSIS — Z8701 Personal history of pneumonia (recurrent): Secondary | ICD-10-CM | POA: Diagnosis not present

## 2016-10-09 DIAGNOSIS — F259 Schizoaffective disorder, unspecified: Secondary | ICD-10-CM | POA: Diagnosis present

## 2016-10-09 DIAGNOSIS — K219 Gastro-esophageal reflux disease without esophagitis: Secondary | ICD-10-CM | POA: Diagnosis present

## 2016-10-09 DIAGNOSIS — I330 Acute and subacute infective endocarditis: Secondary | ICD-10-CM | POA: Diagnosis present

## 2016-10-09 DIAGNOSIS — K222 Esophageal obstruction: Secondary | ICD-10-CM | POA: Diagnosis present

## 2016-10-09 DIAGNOSIS — I361 Nonrheumatic tricuspid (valve) insufficiency: Secondary | ICD-10-CM | POA: Diagnosis not present

## 2016-10-09 DIAGNOSIS — Z8673 Personal history of transient ischemic attack (TIA), and cerebral infarction without residual deficits: Secondary | ICD-10-CM | POA: Diagnosis not present

## 2016-10-09 DIAGNOSIS — Z880 Allergy status to penicillin: Secondary | ICD-10-CM

## 2016-10-09 DIAGNOSIS — E039 Hypothyroidism, unspecified: Secondary | ICD-10-CM | POA: Diagnosis present

## 2016-10-09 DIAGNOSIS — G309 Alzheimer's disease, unspecified: Secondary | ICD-10-CM | POA: Diagnosis present

## 2016-10-09 DIAGNOSIS — I38 Endocarditis, valve unspecified: Secondary | ICD-10-CM | POA: Diagnosis present

## 2016-10-09 DIAGNOSIS — F0280 Dementia in other diseases classified elsewhere without behavioral disturbance: Secondary | ICD-10-CM | POA: Diagnosis present

## 2016-10-09 DIAGNOSIS — Y831 Surgical operation with implant of artificial internal device as the cause of abnormal reaction of the patient, or of later complication, without mention of misadventure at the time of the procedure: Secondary | ICD-10-CM | POA: Diagnosis present

## 2016-10-09 HISTORY — DX: Bradycardia, unspecified: R00.1

## 2016-10-09 LAB — COMPREHENSIVE METABOLIC PANEL
ALT: 14 U/L (ref 14–54)
ANION GAP: 7 (ref 5–15)
AST: 22 U/L (ref 15–41)
Albumin: 2.7 g/dL — ABNORMAL LOW (ref 3.5–5.0)
Alkaline Phosphatase: 101 U/L (ref 38–126)
BUN: 25 mg/dL — ABNORMAL HIGH (ref 6–20)
CHLORIDE: 104 mmol/L (ref 101–111)
CO2: 28 mmol/L (ref 22–32)
CREATININE: 0.66 mg/dL (ref 0.44–1.00)
Calcium: 8.2 mg/dL — ABNORMAL LOW (ref 8.9–10.3)
Glucose, Bld: 115 mg/dL — ABNORMAL HIGH (ref 65–99)
Potassium: 3.4 mmol/L — ABNORMAL LOW (ref 3.5–5.1)
SODIUM: 139 mmol/L (ref 135–145)
Total Bilirubin: 0.4 mg/dL (ref 0.3–1.2)
Total Protein: 6.8 g/dL (ref 6.5–8.1)

## 2016-10-09 LAB — CBC
HCT: 38.2 % (ref 35.0–47.0)
Hemoglobin: 13.2 g/dL (ref 12.0–16.0)
MCH: 30.3 pg (ref 26.0–34.0)
MCHC: 34.5 g/dL (ref 32.0–36.0)
MCV: 88 fL (ref 80.0–100.0)
PLATELETS: 135 10*3/uL — AB (ref 150–440)
RBC: 4.34 MIL/uL (ref 3.80–5.20)
RDW: 14.2 % (ref 11.5–14.5)
WBC: 14.3 10*3/uL — ABNORMAL HIGH (ref 3.6–11.0)

## 2016-10-09 MED ORDER — ACETAMINOPHEN 650 MG RE SUPP: 650.0000 mg | Freq: Four times a day (QID) | RECTAL | Status: DC | PRN

## 2016-10-09 MED ORDER — HYDROCODONE-ACETAMINOPHEN 5-325 MG PO TABS: 1.0000 | ORAL_TABLET | ORAL | Status: DC | PRN

## 2016-10-09 MED ORDER — DIVALPROEX SODIUM ER 500 MG PO TB24
500.0000 mg | ORAL_TABLET | Freq: Every day | ORAL | Status: DC
  Administered 2016-10-09 – 2016-10-13 (×5): 500 mg via ORAL
  Filled 2016-10-09 (×7): qty 1

## 2016-10-09 MED ORDER — SACCHAROMYCES BOULARDII 250 MG PO CAPS
250.0000 mg | ORAL_CAPSULE | Freq: Every day | ORAL | Status: DC
  Administered 2016-10-10 – 2016-10-14 (×5): 250 mg via ORAL
  Filled 2016-10-09 (×5): qty 1

## 2016-10-09 MED ORDER — ACETAMINOPHEN 325 MG PO TABS: 650.0000 mg | ORAL_TABLET | Freq: Four times a day (QID) | ORAL | Status: DC | PRN

## 2016-10-09 MED ORDER — SENNA 8.6 MG PO TABS
1.0000 | ORAL_TABLET | ORAL | Status: DC
  Administered 2016-10-11: 8.6 mg via ORAL
  Filled 2016-10-09 (×2): qty 1

## 2016-10-09 MED ORDER — GABAPENTIN 100 MG PO CAPS
200.0000 mg | ORAL_CAPSULE | Freq: Three times a day (TID) | ORAL | Status: DC
  Administered 2016-10-09 – 2016-10-14 (×15): 200 mg via ORAL
  Filled 2016-10-09 (×15): qty 2

## 2016-10-09 MED ORDER — ONDANSETRON HCL 4 MG PO TABS: 4.0000 mg | ORAL_TABLET | Freq: Four times a day (QID) | ORAL | Status: DC | PRN

## 2016-10-09 MED ORDER — FLUTICASONE PROPIONATE 50 MCG/ACT NA SUSP
2.0000 | Freq: Every day | NASAL | Status: DC
  Administered 2016-10-10 – 2016-10-14 (×4): 2 via NASAL
  Filled 2016-10-09 (×2): qty 16

## 2016-10-09 MED ORDER — MELATONIN 3 MG PO TABS
3.0000 mg | ORAL_TABLET | Freq: Every day | ORAL | Status: DC
  Filled 2016-10-09: qty 1

## 2016-10-09 MED ORDER — MELATONIN 5 MG PO TABS
2.5000 mg | ORAL_TABLET | Freq: Every day | ORAL | Status: DC
  Administered 2016-10-09 – 2016-10-13 (×5): 2.5 mg via ORAL
  Filled 2016-10-09 (×7): qty 0.5

## 2016-10-09 MED ORDER — VITAMIN D3 25 MCG (1000 UNIT) PO TABS
2000.0000 [IU] | ORAL_TABLET | Freq: Every day | ORAL | Status: DC
  Administered 2016-10-10 – 2016-10-14 (×5): 2000 [IU] via ORAL
  Filled 2016-10-09 (×9): qty 2

## 2016-10-09 MED ORDER — FERROUS SULFATE 325 (65 FE) MG PO TABS
325.0000 mg | ORAL_TABLET | Freq: Every day | ORAL | Status: DC
  Administered 2016-10-10 – 2016-10-14 (×5): 325 mg via ORAL
  Filled 2016-10-09 (×5): qty 1

## 2016-10-09 MED ORDER — METOPROLOL TARTRATE 25 MG PO TABS
12.5000 mg | ORAL_TABLET | Freq: Two times a day (BID) | ORAL | Status: DC
  Administered 2016-10-09 – 2016-10-14 (×10): 12.5 mg via ORAL
  Filled 2016-10-09 (×10): qty 1

## 2016-10-09 MED ORDER — LORATADINE 10 MG PO TABS
10.0000 mg | ORAL_TABLET | Freq: Every day | ORAL | Status: DC
  Administered 2016-10-10 – 2016-10-14 (×5): 10 mg via ORAL
  Filled 2016-10-09 (×5): qty 1

## 2016-10-09 MED ORDER — ONDANSETRON HCL 4 MG PO TABS: 8.0000 mg | ORAL_TABLET | Freq: Three times a day (TID) | ORAL | Status: DC | PRN

## 2016-10-09 MED ORDER — ONDANSETRON HCL 4 MG/2ML IJ SOLN
4.0000 mg | Freq: Four times a day (QID) | INTRAMUSCULAR | Status: DC | PRN
  Filled 2016-10-09: qty 2

## 2016-10-09 MED ORDER — ENOXAPARIN SODIUM 40 MG/0.4ML ~~LOC~~ SOLN
40.0000 mg | SUBCUTANEOUS | Status: DC
  Administered 2016-10-09 – 2016-10-13 (×5): 40 mg via SUBCUTANEOUS
  Filled 2016-10-09 (×5): qty 0.4

## 2016-10-09 MED ORDER — ALUM & MAG HYDROXIDE-SIMETH 200-200-20 MG/5ML PO SUSP: 30.0000 mL | ORAL | Status: DC | PRN

## 2016-10-09 MED ORDER — SODIUM CHLORIDE 1 G PO TABS
1.0000 g | ORAL_TABLET | Freq: Three times a day (TID) | ORAL | Status: DC
  Administered 2016-10-09 – 2016-10-14 (×14): 1 g via ORAL
  Filled 2016-10-09 (×17): qty 1

## 2016-10-09 MED ORDER — FUROSEMIDE 20 MG PO TABS
20.0000 mg | ORAL_TABLET | ORAL | Status: DC
  Administered 2016-10-11 – 2016-10-13 (×2): 20 mg via ORAL
  Filled 2016-10-09 (×4): qty 1

## 2016-10-09 MED ORDER — VANCOMYCIN HCL IN DEXTROSE 1-5 GM/200ML-% IV SOLN
1000.0000 mg | Freq: Once | INTRAVENOUS | Status: AC
  Administered 2016-10-09: 1000 mg via INTRAVENOUS
  Filled 2016-10-09: qty 200

## 2016-10-09 MED ORDER — SERTRALINE HCL 50 MG PO TABS
50.0000 mg | ORAL_TABLET | Freq: Every day | ORAL | Status: DC
  Administered 2016-10-09 – 2016-10-13 (×5): 50 mg via ORAL
  Filled 2016-10-09 (×5): qty 1

## 2016-10-09 MED ORDER — RIVASTIGMINE 4.6 MG/24HR TD PT24
4.6000 mg | MEDICATED_PATCH | Freq: Every day | TRANSDERMAL | Status: DC
  Administered 2016-10-10 – 2016-10-14 (×5): 4.6 mg via TRANSDERMAL
  Filled 2016-10-09 (×5): qty 1

## 2016-10-09 MED ORDER — LEVOTHYROXINE SODIUM 25 MCG PO TABS
125.0000 ug | ORAL_TABLET | Freq: Every day | ORAL | Status: DC
  Administered 2016-10-10 – 2016-10-14 (×4): 125 ug via ORAL
  Filled 2016-10-09 (×4): qty 1

## 2016-10-09 MED ORDER — ENSURE ENLIVE PO LIQD
237.0000 mL | Freq: Two times a day (BID) | ORAL | Status: DC
  Administered 2016-10-10 – 2016-10-14 (×6): 237 mL via ORAL

## 2016-10-09 MED ORDER — MAGNESIUM HYDROXIDE 400 MG/5ML PO SUSP: 30.0000 mL | Freq: Every day | ORAL | Status: DC | PRN

## 2016-10-09 MED ORDER — DOCUSATE SODIUM 100 MG PO CAPS
100.0000 mg | ORAL_CAPSULE | Freq: Two times a day (BID) | ORAL | Status: DC
  Administered 2016-10-09 – 2016-10-13 (×5): 100 mg via ORAL
  Filled 2016-10-09 (×5): qty 1

## 2016-10-09 MED ORDER — LOPERAMIDE HCL 2 MG PO CAPS: 2.0000 mg | ORAL_CAPSULE | ORAL | Status: DC | PRN

## 2016-10-09 MED ORDER — GUAIFENESIN 100 MG/5ML PO SYRP
200.0000 mg | ORAL_SOLUTION | Freq: Four times a day (QID) | ORAL | Status: DC | PRN
  Filled 2016-10-09: qty 10

## 2016-10-09 MED ORDER — VANCOMYCIN HCL 500 MG IV SOLR
500.0000 mg | Freq: Two times a day (BID) | INTRAVENOUS | Status: DC
  Filled 2016-10-09 (×2): qty 500

## 2016-10-09 NOTE — Consult Note (Signed)
Pharmacy Antibiotic Note  Jill Copeland is a 83 y.o. female admitted on 10/09/2016 with bacteremia and possible endocarditis. Bcx positive for staph hominis.  Pharmacy has been consulted for vancoymcin dosing. Patient has an unknown PCN allergy. Pt TTE showed vegitation. TEE was recommended. Plan: Vancomycin 1g once then 500mg  q 12 hours Vancomycin 500 IV every 12 hours.  Goal trough 15-20 mcg/mL. Trough prior to the 5th dose 7/23 @ 1630  Height: 5\' 3"  (160 cm) Weight: 121 lb (54.9 kg) IBW/kg (Calculated) : 52.4  Temp (24hrs), Avg:98.3 F (36.8 C), Min:97.9 F (36.6 C), Max:98.7 F (37.1 C)   Recent Labs Lab 10/04/16 0414 10/04/16 2342 10/09/16 1435  WBC  --   --  14.3*  CREATININE 0.58  --  0.66  VANCOTROUGH  --  6*  --     Estimated Creatinine Clearance: 44.1 mL/min (by C-G formula based on SCr of 0.66 mg/dL).     Antimicrobials this admission: vancomycin 7/12 >>    Dose adjustments this admission:   Microbiology results: 7/14 BCx: staph hominis 7/17 Bcx: NG   Thank you for allowing pharmacy to be a part of this patient's care.  Ramond Dial, Pharm.D, BCPS Clinical Pharmacist

## 2016-10-09 NOTE — Plan of Care (Signed)
Problem: Safety: Goal: Ability to remain free from injury will improve Outcome: Progressing Patient educated to call for assistance, bed alarm on, non skid socks on, call light within reach. Hourly rounding completed on patient.

## 2016-10-09 NOTE — ED Provider Notes (Signed)
[facility] Emergency Department Provider Note   ____________________________________________    I have reviewed the triage vital signs and the nursing notes.   HISTORY  Chief Complaint Called back to Hospital    HPI Jill Copeland is a 83 y.o. female who was called back to the hospital for possible vegetations on cardiac valve. Patient had been admitted for severe sepsis secondary to pneumonia had been treated well, echocardiogram found to show questionable vegetations. Patient call back for further evaluation. Patient with Alzheimer's dementia and unable to provide significant history   Past Medical History:  Diagnosis Date  . Alzheimer disease   . Arrhythmia    with syncope s/p PPM  . Arthritis   . Bacteremia    several timesf  . Diabetes mellitus without complication ([place])   . Dysphagia causing pulmonary aspiration with swallowing   . Esophageal stricture   . GERD (gastroesophageal reflux disease)   . Hiatal hernia    s/p repair with fistula  . Hypothyroidism   . Pneumococcal pneumonia ([place])   . Recurrent pneumonia   . Recurrent UTI   . Schizo affective schizophrenia ([place])   . Stroke (cerebrum) [facility])     Patient Active Problem List   Diagnosis Date Noted  . Endocarditis 10/09/2016  . Sepsis ([place]) 10/02/2016  . Schizoaffective disorder ([place])   . Pressure ulcer 01/13/2015  . Multiple falls 01/12/2015  . Overactive bladder 12/30/2014  . Diabetes mellitus without complication ([place])   . Alzheimer disease   . Schizo affective schizophrenia ([place])   . Recurrent pneumonia   . Dysphagia causing pulmonary aspiration with swallowing   . Pneumococcal pneumonia ([place])   . Arrhythmia   . Esophageal stricture   . Hypothyroidism   . Lung nodule   . Dementia in chronic schizophrenia ([place]) 08/12/2014  . Social discord 08/12/2014  . Family conflict 53/29/9242  . Schizoaffective disorder, unspecified type [facility])     Past Surgical History:   Procedure Laterality Date  . HERNIA REPAIR    . JEJUNOSTOMY FEEDING TUBE    . PACEMAKER INSERTION      Prior to Admission medications   Medication Sig Start Date End Date Taking? Authorizing Provider  acetaminophen (TYLENOL) 500 MG tablet Take 500 mg by mouth every 4 (four) hours as needed.   Yes [provider]  alum & mag hydroxide-simeth (MINTOX) 200-200-20 MG/5ML suspension Take 30 mLs by mouth as needed for indigestion or heartburn.   Yes [provider]  azithromycin (ZITHROMAX) 250 MG tablet Take 1 tablet (250 mg total) by mouth daily. 10/05/16  Yes Fritzi Mandes, MD  cefUROXime (CEFTIN) 500 MG tablet Take 1 tablet (500 mg total) by mouth 2 (two) times daily with a meal. 10/05/16 10/12/16 Yes Fritzi Mandes, MD  cetirizine (ZYRTEC) 10 MG tablet Take 10 mg by mouth daily.   Yes [provider]  Cholecalciferol 2000 units TABS Take 2,000 Units by mouth daily.   Yes [provider]  divalproex (DEPAKOTE ER) 500 MG 24 hr tablet Take 500 mg by mouth at bedtime.   Yes [provider]  docusate sodium (COLACE) 100 MG capsule Take 100 mg by mouth 2 (two) times daily.   Yes [provider]  feeding supplement, ENSURE ENLIVE, (ENSURE ENLIVE) LIQD Take 237 mLs by mouth 2 (two) times daily between meals. 10/05/16  Yes Fritzi Mandes, MD  ferrous sulfate 325 (65 FE) MG tablet Take 325 mg by mouth daily with breakfast.   Yes  [provider]  fluticasone (FLONASE) 50 MCG/ACT nasal spray Place 2 sprays into both nostrils daily.   Yes [provider]  furosemide (LASIX) 20 MG tablet Take 20 mg by mouth every other day.   Yes [provider]  gabapentin (NEURONTIN) 100 MG capsule Take 200 mg by mouth 3 (three) times daily.   Yes [provider]  guaifenesin (ROBITUSSIN) 100 MG/5ML syrup Take 200 mg by mouth 4 (four) times daily as needed for cough.   Yes [provider]  levothyroxine (SYNTHROID, LEVOTHROID) 125 MCG  tablet Take 125 mcg by mouth daily before breakfast.    Yes [provider]  loperamide (IMODIUM) 2 MG capsule Take 2 mg by mouth as needed for diarrhea or loose stools.   Yes [provider]  magnesium hydroxide (MILK OF MAGNESIA) 400 MG/5ML suspension Take 30 mLs by mouth daily as needed for mild constipation.   Yes [provider]  Melatonin 3 MG TABS Take 3 mg by mouth at bedtime.   Yes [provider]  metoprolol tartrate (LOPRESSOR) 25 MG tablet Take 12.5 mg by mouth 2 (two) times daily.   Yes [provider]  neomycin-bacitracin-polymyxin (NEOSPORIN) ointment Apply 1 application topically as needed for wound care. apply to eye   Yes [provider]  ondansetron (ZOFRAN) 8 MG tablet Take 8 mg by mouth every 8 (eight) hours as needed for nausea or vomiting.   Yes [provider]  potassium chloride (K-DUR,KLOR-CON) 10 MEQ tablet Take 10 mEq by mouth every other day.   Yes [provider]  rivastigmine (EXELON) 4.6 mg/24hr Place 4.6 mg onto the skin daily.   Yes [provider]  saccharomyces boulardii (FLORASTOR) 250 MG capsule Take 1 capsule (250 mg total) by mouth 2 (two) times daily. Patient taking differently: Take 250 mg by mouth daily.  01/01/15  Yes Hosie Poisson, MD  senna (SENOKOT) 8.6 MG TABS tablet Take 1 tablet by mouth 2 (two) times a week. Mondays and Fridays   Yes [provider]  sertraline (ZOLOFT) 50 MG tablet Take 50 mg by mouth at bedtime.   Yes [provider]  sodium chloride 1 G tablet Take 1 g by mouth 3 (three) times daily.   Yes [provider]  traMADol (ULTRAM) 50 MG tablet Take 1 tablet (50 mg total) by mouth 2 (two) times daily as needed for moderate pain. Patient taking differently: Take 50 mg by mouth 2 (two) times daily.  01/14/15  Yes [name], MD     Allergies Penicillins; Sertraline; and Zoloft [sertraline hcl]  Family History  Problem  Relation Age of Onset  . Thyroid cancer Grandchild   . Thyroid nodules Daughter   . Thyroid cancer Daughter   . Dementia Mother   . Schizophrenia Maternal Aunt     Social History Social History  Substance Use Topics  . Smoking status: Never Smoker  . Smokeless tobacco: Never Used  . Alcohol use No    L5 caveat: Unable to obtain Review of Systems due to severe dementia    ____________________________________________   PHYSICAL EXAM:  VITAL SIGNS: ED Triage Vitals [10/09/16 1335]  Enc Vitals Group     BP 133/66     Pulse Rate 67     Resp 18     Temp 97.9 F (36.6 C)     Temp Source Oral     SpO2 94 %     Weight 54.9 kg (121 lb)  Height 1.6 m (5\' 3" )     Head Circumference      Peak Flow      Pain Score      Pain Loc      Pain Edu?      Excl. in [place]?     Constitutional: Alert. No acute distress.  . Nose: No congestion/rhinnorhea. Mouth/Throat: Mucous membranes are moist.    Cardiovascular: Normal rate, regular rhythm. Grossly normal heart sounds.  No murmur Good peripheral circulation. Respiratory: Normal respiratory effort.  No retractions.  Gastrointestinal: Soft and nontender. No distention.  No CVA tenderness.  Musculoskeletal: No lower extremity tenderness nor edema.  Warm and well perfused Neurologic:  Normal speech and language. No gross focal neurologic deficits are appreciated.  Skin:  Skin is warm, dry and intact. No rash noted.   ____________________________________________   LABS (all labs ordered are listed, but only abnormal results are displayed)  Labs Reviewed  CULTURE, BLOOD (ROUTINE X 2)  CULTURE, BLOOD (ROUTINE X 2)  CBC  COMPREHENSIVE METABOLIC PANEL   ____________________________________________  EKG  None ____________________________________________  RADIOLOGY  None ____________________________________________   PROCEDURES  Procedure(s) performed: No    Critical Care performed:  No ____________________________________________   INITIAL IMPRESSION / ASSESSMENT AND PLAN / ED COURSE  Pertinent labs & imaging results that were available during my care of the patient were reviewed by me and considered in my medical decision making (see chart for details).  Patient presents from nursing home because she was called back to the hospital given concerning echo read, discussed with hospitalist they will admit the patient.    ____________________________________________   FINAL CLINICAL IMPRESSION(S) / ED DIAGNOSES  Final diagnoses:  Infective endocarditis, due to unspecified organism, unspecified chronicity      NEW MEDICATIONS STARTED DURING THIS VISIT:  New Prescriptions   No medications on file     Note:  This document was prepared using Dragon voice recognition software and may include unintentional dictation errors.    Lavonia Drafts, MD 10/09/16 1447

## 2016-10-09 NOTE — H&P (Signed)
[place] [facility] [place] NAME: Jill Copeland    MR#:  [id number]  DATE OF BIRTH:  1931/09/19  DATE OF ADMISSION:  10/09/2016  PRIMARY CARE PHYSICIAN: Marco Collie, MD   REQUESTING/REFERRING PHYSICIAN: dr Corky Downs  CHIEF COMPLAINT:   Possible Endocarditis HISTORY OF PRESENT ILLNESS:  Jill Copeland  is a 83 y.o. female with a known history of Recent admission for acute respiratory failure due to bibasilar pneumonia and staph hominis sepsis he was discharged on March 17 and presents today due to follow-up echocardiogram showing possible endocarditis. Patient was contacted this morning due to abnormal echocardiogram with possible endocarditis.   PAST MEDICAL HISTORY:   Past Medical History:  Diagnosis Date  . Alzheimer disease   . Arrhythmia    with syncope s/p PPM  . Arthritis   . Bacteremia    several timesf  . Diabetes mellitus without complication ([place])   . Dysphagia causing pulmonary aspiration with swallowing   . Esophageal stricture   . GERD (gastroesophageal reflux disease)   . Hiatal hernia    s/p repair with fistula  . Hypothyroidism   . Pneumococcal pneumonia ([place])   . Recurrent pneumonia   . Recurrent UTI   . Schizo affective schizophrenia ([place])   . Stroke (cerebrum) ([place])     PAST SURGICAL HISTORY:   Past Surgical History:  Procedure Laterality Date  . HERNIA REPAIR    . JEJUNOSTOMY FEEDING TUBE    . PACEMAKER INSERTION      SOCIAL HISTORY:   Social History  Substance Use Topics  . Smoking status: Never Smoker  . Smokeless tobacco: Never Used  . Alcohol use No    FAMILY HISTORY:   Family History  Problem Relation Age of Onset  . Thyroid cancer Grandchild   . Thyroid nodules Daughter   . Thyroid cancer Daughter   . Dementia Mother   . Schizophrenia Maternal Aunt     DRUG ALLERGIES:   PCN zoloft   REVIEW OF SYSTEMS:   Review of Systems  Unable to perform ROS: Dementia    MEDICATIONS AT HOME:    Prior to Admission medications   Medication Sig Start Date End Date Taking? Authorizing Provider  acetaminophen (TYLENOL) 500 MG tablet Take 500 mg by mouth every 4 (four) hours as needed.   Yes [provider]  alum & mag hydroxide-simeth (MINTOX) 200-200-20 MG/5ML suspension Take 30 mLs by mouth as needed for indigestion or heartburn.   Yes [provider]  azithromycin (ZITHROMAX) 250 MG tablet Take 1 tablet (250 mg total) by mouth daily. 10/05/16  Yes Fritzi Mandes, MD  cefUROXime (CEFTIN) 500 MG tablet Take 1 tablet (500 mg total) by mouth 2 (two) times daily with a meal. 10/05/16 10/12/16 Yes Fritzi Mandes, MD  cetirizine (ZYRTEC) 10 MG tablet Take 10 mg by mouth daily.   Yes [provider]  Cholecalciferol 2000 units TABS Take 2,000 Units by mouth daily.   Yes [provider]  divalproex (DEPAKOTE ER) 500 MG 24 hr tablet Take 500 mg by mouth at bedtime.   Yes [provider]  docusate sodium (COLACE) 100 MG capsule Take 100 mg by mouth 2 (two) times daily.   Yes [provider]  feeding supplement, ENSURE ENLIVE, (ENSURE ENLIVE) LIQD Take 237 mLs by mouth 2 (two) times daily between meals. 10/05/16  Yes Fritzi Mandes, MD  ferrous sulfate 325 (65 FE) MG tablet Take 325 mg by mouth daily with breakfast.  Yes [provider]  fluticasone (FLONASE) 50 MCG/ACT nasal spray Place 2 sprays into both nostrils daily.   Yes [provider]  furosemide (LASIX) 20 MG tablet Take 20 mg by mouth every other day.   Yes [provider]  gabapentin (NEURONTIN) 100 MG capsule Take 200 mg by mouth 3 (three) times daily.   Yes [provider]  guaifenesin (ROBITUSSIN) 100 MG/5ML syrup Take 200 mg by mouth 4 (four) times daily as needed for cough.   Yes [provider]  levothyroxine (SYNTHROID, LEVOTHROID) 125 MCG tablet Take 125 mcg by mouth daily before breakfast.    Yes [provider]  loperamide (IMODIUM)  2 MG capsule Take 2 mg by mouth as needed for diarrhea or loose stools.   Yes [provider]  magnesium hydroxide (MILK OF MAGNESIA) 400 MG/5ML suspension Take 30 mLs by mouth daily as needed for mild constipation.   Yes [provider]  Melatonin 3 MG TABS Take 3 mg by mouth at bedtime.   Yes [provider]  metoprolol tartrate (LOPRESSOR) 25 MG tablet Take 12.5 mg by mouth 2 (two) times daily.   Yes [provider]  neomycin-bacitracin-polymyxin (NEOSPORIN) ointment Apply 1 application topically as needed for wound care. apply to eye   Yes [provider]  ondansetron (ZOFRAN) 8 MG tablet Take 8 mg by mouth every 8 (eight) hours as needed for nausea or vomiting.   Yes [provider]  potassium chloride (K-DUR,KLOR-CON) 10 MEQ tablet Take 10 mEq by mouth every other day.   Yes [provider]  rivastigmine (EXELON) 4.6 mg/24hr Place 4.6 mg onto the skin daily.   Yes [provider]  saccharomyces boulardii (FLORASTOR) 250 MG capsule Take 1 capsule (250 mg total) by mouth 2 (two) times daily. Patient taking differently: Take 250 mg by mouth daily.  01/01/15  Yes Hosie Poisson, MD  senna (SENOKOT) 8.6 MG TABS tablet Take 1 tablet by mouth 2 (two) times a week. Mondays and Fridays   Yes [provider]  sertraline (ZOLOFT) 50 MG tablet Take 50 mg by mouth at bedtime.   Yes [provider]  sodium chloride 1 G tablet Take 1 g by mouth 3 (three) times daily.   Yes [provider]  traMADol (ULTRAM) 50 MG tablet Take 1 tablet (50 mg total) by mouth 2 (two) times daily as needed for moderate pain. Patient taking differently: Take 50 mg by mouth 2 (two) times daily.  01/14/15  Yes Sainani, Belia Heman, MD      VITAL SIGNS:  Blood pressure 133/66, pulse 67, temperature 97.9 F (36.6 C), temperature source Oral, resp. rate 18, height 5\' 3"  (1.6 m), weight 54.9 kg (121 lb), SpO2 94 %.  PHYSICAL EXAMINATION:    Physical Exam  Constitutional: She is well-developed, well-nourished, and in no distress. No distress.  HENT:  Head: Normocephalic.  Eyes: No scleral icterus.  Neck: Normal range of motion. Neck supple. No JVD present. No tracheal deviation present.  Cardiovascular: Normal rate, regular rhythm and normal heart sounds.  Exam reveals no gallop and no friction rub.   No murmur heard. Pulmonary/Chest: Effort normal and breath sounds normal. No respiratory distress. She has no wheezes. She has no rales. She exhibits no tenderness.  Abdominal: Soft. Bowel sounds are normal. She exhibits no distension and no mass. There is no tenderness. There is no rebound and no guarding.  Musculoskeletal: Normal range of motion. She exhibits no edema.  Neurological:  She is alert.  Oriented to name  Skin: Skin is warm. No rash noted. No erythema.  Psychiatric: Affect and judgment normal.      LABORATORY PANEL:   CBC No results for input(s): WBC, HGB, HCT, PLT in the last 168 hours. ------------------------------------------------------------------------------------------------------------------  Chemistries   Recent Labs Lab 10/04/16 0414  NA 137  K 3.4*  CL 98*  CO2 32  GLUCOSE 161*  BUN 11  CREATININE 0.58  CALCIUM 8.7*  MG 1.8   ------------------------------------------------------------------------------------------------------------------  Cardiac Enzymes No results for input(s): TROPONINI in the last 168 hours. ------------------------------------------------------------------------------------------------------------------  RADIOLOGY:  No results found.  EKG:     IMPRESSION AND PLAN:   83 year old female with history of Alzheimer's dementia, chronic dysphasia who was recently discharged from the hospital with sepsis due to multifocal pneumonia and staph hominis bacteremia who is being admitted due to abnormal echocardiogram concerning for endocarditis  1. Endocarditis:  Start vancomycin [facility] MG cardiology consultation for possible TEE Follow up on repeat blood cultures Stop Ceftin and Zithromax for now.  2. Hypothyroid: Continue Synthroid  3. Alzheimer's dementia: Patient is from [place] 4. History of schizoaffective disorder: Continue outpatient medications   All the records are reviewed and case discussed with ED provider.   CODE STATUS: FULL  TOTAL TIME TAKING CARE OF THIS PATIENT: 40 minutes.    Diontay Rosencrans M.D on 10/09/2016 at 2:40 PM  Between 7am to 6pm - Pager - [phone]  After 6pm go to [email or web address] - password [email or web address] [facility]  Office  [phone]  CC: Primary care physician; Marco Collie, MD

## 2016-10-10 LAB — CBC
HCT: 39.8 % (ref 35.0–47.0)
HEMOGLOBIN: 13.5 g/dL (ref 12.0–16.0)
MCH: 30.5 pg (ref 26.0–34.0)
MCHC: 34 g/dL (ref 32.0–36.0)
MCV: 89.7 fL (ref 80.0–100.0)
PLATELETS: 126 10*3/uL — AB (ref 150–440)
RBC: 4.44 MIL/uL (ref 3.80–5.20)
RDW: 14.2 % (ref 11.5–14.5)
WBC: 10.9 10*3/uL (ref 3.6–11.0)

## 2016-10-10 LAB — CULTURE, BLOOD (SINGLE): CULTURE: NO GROWTH

## 2016-10-10 MED ORDER — SODIUM CHLORIDE 0.9 % IV SOLN
500.0000 mg | Freq: Two times a day (BID) | INTRAVENOUS | Status: DC
  Administered 2016-10-10 – 2016-10-11 (×3): 500 mg via INTRAVENOUS
  Filled 2016-10-10 (×5): qty 500
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
## 2016-10-10 NOTE — Progress Notes (Signed)
Patient scheduled for TEE on July 23rd. Orders placed

## 2016-10-10 NOTE — Progress Notes (Signed)
Jill Copeland NAME: Jill Copeland    MR#:  [id number]  DATE OF BIRTH:  1932-02-11  SUBJECTIVE: Admitted for possible infective endocarditis with abnormal echo. On vancomycin. Scheduled to have TEE tomorrow. Patient denies any complaints.   CHIEF COMPLAINT:   Chief Complaint  Patient presents with  . Abnormal Lab    sent in for positive bx and positive vegatative endocarditis    REVIEW OF SYSTEMS:   ROS CONSTITUTIONAL: No fever, fatigue or weakness.  EYES: No blurred or double vision.  EARS, NOSE, AND THROAT: No tinnitus or ear pain.  RESPIRATORY: No cough, shortness of breath, wheezing or hemoptysis.  CARDIOVASCULAR: No chest pain, orthopnea, edema.  GASTROINTESTINAL: No nausea, vomiting, diarrhea or abdominal pain.  GENITOURINARY: No dysuria, hematuria.  ENDOCRINE: No polyuria, nocturia,  HEMATOLOGY: No anemia, easy bruising or bleeding SKIN: No rash or lesion. MUSCULOSKELETAL: No joint pain or arthritis.   NEUROLOGIC: No tingling, numbness, weakness.  PSYCHIATRY: No anxiety or depression.   DRUG ALLERGIES:     VITALS:  Blood pressure (!) 172/57, pulse 62, temperature 99 F (37.2 C), temperature source Oral, resp. rate 18, height 5\' 3"  (1.6 m), weight 54.9 kg (121 lb), SpO2 98 %.  PHYSICAL EXAMINATION:  GENERAL:  83 y.o.-year-old patient lying in the bed with no acute distress.  EYES: Pupils equal, round, reactive to light and accommodation. No scleral icterus. Extraocular muscles intact.  HEENT: Head atraumatic, normocephalic. Oropharynx and nasopharynx clear.  NECK:  Supple, no jugular venous distention. No thyroid enlargement, no tenderness.  LUNGS: Normal breath sounds bilaterally, no wheezing, rales,rhonchi or crepitation. No use of accessory muscles of respiration.  CARDIOVASCULAR: S1, S2 normal. No murmurs, rubs, or gallops.  ABDOMEN: Soft, nontender, nondistended. Bowel sounds present. No organomegaly or  mass.  EXTREMITIES: No pedal edema, cyanosis, or clubbing.  NEUROLOGIC: Cranial nerves II through XII are intact. Muscle strength 5/5 in all extremities. Sensation intact. Gait not checked.  PSYCHIATRIC: The patient is alert and oriented x 3.  SKIN: No obvious rash, lesion, or ulcer.    LABORATORY PANEL:   CBC  Recent Labs Lab 10/10/16 0706  WBC 10.9  HGB 13.5  HCT 39.8  PLT 126*   ------------------------------------------------------------------------------------------------------------------  Chemistries   Recent Labs Lab 10/04/16 0414 10/09/16 1435  NA 137 139  K 3.4* 3.4*  CL 98* 104  CO2 32 28  GLUCOSE 161* 115*  BUN 11 25*  CREATININE 0.58 0.66  CALCIUM 8.7* 8.2*  MG 1.8  --   AST  --  22  ALT  --  14  ALKPHOS  --  101  BILITOT  --  0.4   ------------------------------------------------------------------------------------------------------------------  Cardiac Enzymes No results for input(s): TROPONINI in the last 168 hours. ------------------------------------------------------------------------------------------------------------------  RADIOLOGY:  No results found.  EKG:   Orders placed or performed during the hospital encounter of 10/02/16  . EKG 12-Lead  . EKG 12-Lead    ASSESSMENT AND PLAN:   #[age] year old female patient with a recent discharge from hospital for sepsis with staph hominis, discharge to assisted living facility with azithromycin, cefitin  comes back because of possible vegetations by echocardiogram in right ventricle. Continue vancomycin, spoke with cardiology Jill Copeland, patient will have TEE tomorrow.  2,Hypothyroid: Continue Synthroid  3. Alzheimer's dementia: Patient is from [place] 4. History of schizoaffective disorder: Continue outpatient medications    All the records are reviewed and case discussed with Care Management/Social Workerr. Management plans discussed with  the patient, family and they are  in agreement.  CODE STATUS: full  TOTAL TIME TAKING CARE OF THIS PATIENT: 35 minutes.   POSSIBLE D/C IN 1-2 DAYS, DEPENDING ON CLINICAL CONDITION.   Jill Copeland M.D on 10/10/2016 at 12:02 PM  Between 7am to 6pm - Pager - [phone]  After 6pm go to [email or web address] - password [email or web address] [facility]  [place] [facility]  Office  [phone]  CC: Primary care physician; Jill Collie, MD   Note: This dictation was prepared with Dragon dictation along with smaller phrase technology. Any transcriptional errors that result from this process are unintentional.

## 2016-10-10 NOTE — NC FL2 (Signed)
[place] LEVEL OF CARE SCREENING TOOL     IDENTIFICATION  Patient Name: Jill Copeland Birthdate: 25-Jul-1931 Sex: female Admission Date (Current Location): 10/09/2016  [place] and [place] Number:  Engineering geologist and Address:  [facility], [address], [place], [place] [address]      Provider Number: [id number]  Attending Physician Name and Address:  [name], MD  Relative Name and Phone Number:       Current Level of Care: Hospital Recommended Level of Care: [place] Prior Approval Number:    Date Approved/Denied:   PASRR Number: [id number]  Discharge Plan: Domiciliary (Rest home)    Current Diagnoses: Patient Active Problem List   Diagnosis Date Noted  . Endocarditis 10/09/2016  . Sepsis ([place]) 10/02/2016  . Schizoaffective disorder ([place])   . Pressure ulcer 01/13/2015  . Multiple falls 01/12/2015  . Overactive bladder 12/30/2014  . Diabetes mellitus without complication ([place])   . Alzheimer disease   . Schizo affective schizophrenia ([place])   . Recurrent pneumonia   . Dysphagia causing pulmonary aspiration with swallowing   . Pneumococcal pneumonia ([place])   . Arrhythmia   . Esophageal stricture   . Hypothyroidism   . Lung nodule   . Dementia in chronic schizophrenia ([place]) 08/12/2014  . Social discord 08/12/2014  . Family conflict [id number]/63/[id number]  . Schizoaffective disorder, unspecified type ([place])     Orientation RESPIRATION BLADDER Height & Weight     Self, Situation, Place  Normal Incontinent Weight: 121 lb (54.9 kg) Height:  5\' 3"  (160 cm)  BEHAVIORAL SYMPTOMS/MOOD NEUROLOGICAL BOWEL NUTRITION STATUS      Continent Diet (Heart healthy)  AMBULATORY STATUS COMMUNICATION OF NEEDS Skin   Supervision Verbally Normal                       Personal Care Assistance Level of Assistance  Bathing, Feeding, Dressing Bathing Assistance: Limited assistance Feeding assistance:  Independent Dressing Assistance: Limited assistance     Functional Limitations Info    Sight Info: Adequate Hearing Info: Adequate Speech Info: Adequate    SPECIAL CARE FACTORS FREQUENCY        PT Frequency: N/A              Contractures Contractures Info: Present    Additional Factors Info  Code Status, Allergies Code Status Info: Full Allergies Info: Penicillins, Sertraline, Zoloft Sertraline Hcl           Current Medications (10/10/2016):  This is the current hospital active medication list Current Facility-Administered Medications  Medication Dose Route Frequency Provider Last Rate Last Dose  . acetaminophen (TYLENOL) tablet 650 mg  650 mg Oral Q6H PRN Bettey Costa, MD       Or  . acetaminophen (TYLENOL) suppository 650 mg  650 mg Rectal Q6H PRN Mody, Sital, MD      . alum & mag hydroxide-simeth (MAALOX/MYLANTA) 200-200-20 MG/5ML suspension 30 mL  30 mL Oral PRN Benjie Karvonen, Sital, MD      . cholecalciferol (VITAMIN D) tablet 2,000 Units  2,000 Units Oral Daily Bettey Costa, MD   2,000 Units at 10/10/16 1015  . divalproex (DEPAKOTE ER) 24 hr tablet 500 mg  500 mg Oral QHS Bettey Costa, MD   500 mg at 10/09/16 2258  . docusate sodium (COLACE) capsule 100 mg  100 mg Oral BID Bettey Costa, MD   Stopped at 10/10/16 [phone]  . enoxaparin (LOVENOX) injection 40 mg  40 mg  Subcutaneous Q24H Bettey Costa, MD   40 mg at 10/09/16 2258  . feeding supplement (ENSURE ENLIVE) (ENSURE ENLIVE) liquid 237 mL  237 mL Oral BID BM Mody, Sital, MD   237 mL at 10/10/16 1015  . ferrous sulfate tablet 325 mg  325 mg Oral Q breakfast Bettey Costa, MD   325 mg at 10/10/16 0851  . fluticasone (FLONASE) 50 MCG/ACT nasal spray 2 spray  2 spray Each Nare Daily Bettey Costa, MD   2 spray at 10/10/16 1134  . [START ON 10/11/2016] furosemide (LASIX) tablet 20 mg  20 mg Oral QODAY Mody, Sital, MD      . gabapentin (NEURONTIN) capsule 200 mg  200 mg Oral TID Bettey Costa, MD   200 mg at 10/10/16 1534  . guaifenesin  (ROBITUSSIN) 100 MG/5ML syrup 200 mg  200 mg Oral QID PRN Bettey Costa, MD      . HYDROcodone-acetaminophen (NORCO/VICODIN) 5-325 MG per tablet 1-2 tablet  1-2 tablet Oral Q4H PRN Mody, Sital, MD      . levothyroxine (SYNTHROID, LEVOTHROID) tablet 125 mcg  125 mcg Oral QAC breakfast Bettey Costa, MD   125 mcg at 10/10/16 0851  . loperamide (IMODIUM) capsule 2 mg  2 mg Oral PRN Bettey Costa, MD      . loratadine (CLARITIN) tablet 10 mg  10 mg Oral Daily Bettey Costa, MD   10 mg at 10/10/16 1015  . magnesium hydroxide (MILK OF MAGNESIA) suspension 30 mL  30 mL Oral Daily PRN Bettey Costa, MD      . Melatonin TABS 2.5 mg  2.5 mg Oral QHS Mody, Sital, MD   2.5 mg at 10/09/16 2258  . metoprolol tartrate (LOPRESSOR) tablet 12.5 mg  12.5 mg Oral BID Bettey Costa, MD   12.5 mg at 10/10/16 1015  . ondansetron (ZOFRAN) tablet 4 mg  4 mg Oral Q6H PRN Bettey Costa, MD       Or  . ondansetron (ZOFRAN) injection 4 mg  4 mg Intravenous Q6H PRN Mody, Sital, MD      . rivastigmine (EXELON) 4.6 mg/24hr 4.6 mg  4.6 mg Transdermal Daily Mody, Sital, MD   4.6 mg at 10/10/16 1015  . saccharomyces boulardii (FLORASTOR) capsule 250 mg  250 mg Oral Daily Bettey Costa, MD   250 mg at 10/10/16 1015  . [START ON 10/11/2016] senna (SENOKOT) tablet 8.6 mg  1 tablet Oral Once per day on Mon Fri Mody, Ulice Bold, MD      . sertraline (ZOLOFT) tablet 50 mg  50 mg Oral QHS Bettey Costa, MD   50 mg at 10/09/16 2258  . sodium chloride tablet 1 g  1 g Oral TID Bettey Costa, MD   1 g at 10/10/16 1534  . vancomycin (VANCOCIN) 500 mg in sodium chloride 0.9 % 100 mL IVPB  500 mg Intravenous Q12H Fritzi Mandes, MD   Stopped at 10/10/16 [phone]     Discharge Medications: Please see discharge summary for a list of discharge medications.  Relevant Imaging Results:  Relevant Lab Results:   Additional Information SS# [id number]  Zettie Pho, LCSW

## 2016-10-10 NOTE — Clinical Social Work Note (Signed)
CSW received consult that the patient is from an ALF. CSW will assess when able.  Santiago Bumpers, MSW, Latanya Presser [phone]

## 2016-10-11 ENCOUNTER — Encounter
Admission: EM | Disposition: A | Discharge status: Skilled Nursing Facility | Payer: Self-pay | Source: Home / Self Care | Attending: Internal Medicine

## 2016-10-11 ENCOUNTER — Inpatient Hospital Stay: Admit: 2016-10-11 | Payer: Medicare Other

## 2016-10-11 ENCOUNTER — Inpatient Hospital Stay: Payer: Medicare Other

## 2016-10-11 ENCOUNTER — Encounter: Payer: Self-pay | Admitting: Physician Assistant

## 2016-10-11 ENCOUNTER — Inpatient Hospital Stay
Admit: 2016-10-11 | Discharge: 2016-10-11 | Disposition: A | Discharge status: Home / Self Care | Payer: Medicare Other | Attending: Internal Medicine | Admitting: Internal Medicine

## 2016-10-11 DIAGNOSIS — I330 Acute and subacute infective endocarditis: Secondary | ICD-10-CM

## 2016-10-11 DIAGNOSIS — T827XXA Infection and inflammatory reaction due to other cardiac and vascular devices, implants and grafts, initial encounter: Principal | ICD-10-CM

## 2016-10-11 HISTORY — PX: TEE WITHOUT CARDIOVERSION: SHX5443

## 2016-10-11 LAB — VANCOMYCIN, TROUGH: Vancomycin Tr: 12 ug/mL — ABNORMAL LOW (ref 15–20)

## 2016-10-11 LAB — BASIC METABOLIC PANEL
Anion gap: 10 (ref 5–15)
BUN: 19 mg/dL (ref 6–20)
CHLORIDE: 98 mmol/L — AB (ref 101–111)
CO2: 25 mmol/L (ref 22–32)
CREATININE: 0.65 mg/dL (ref 0.44–1.00)
Calcium: 8.7 mg/dL — ABNORMAL LOW (ref 8.9–10.3)
GFR calc Af Amer: >60 mL/min (ref >60)
GFR calc non Af Amer: >60 mL/min (ref >60)
GLUCOSE: 150 mg/dL — AB (ref 65–99)
POTASSIUM: 4 mmol/L (ref 3.5–5.1)
Sodium: 133 mmol/L — ABNORMAL LOW (ref 135–145)

## 2016-10-11 LAB — CREATININE, SERUM
CREATININE: 0.91 mg/dL (ref 0.44–1.00)
GFR calc Af Amer: >60 mL/min (ref >60)
GFR, EST NON AFRICAN AMERICAN: 57 mL/min — AB (ref >60)

## 2016-10-11 SURGERY — ECHOCARDIOGRAM, TRANSESOPHAGEAL
Anesthesia: Moderate Sedation

## 2016-10-11 MED ORDER — SODIUM CHLORIDE 0.9 % IV SOLN
Freq: Once | INTRAVENOUS | Status: AC
  Administered 2016-10-11: 08:00:00 via INTRAVENOUS

## 2016-10-11 MED ORDER — IOPAMIDOL (ISOVUE-300) INJECTION 61%
75.0000 mL | Freq: Once | INTRAVENOUS | Status: AC | PRN
  Administered 2016-10-11: 75 mL via INTRAVENOUS

## 2016-10-11 MED ORDER — BUTAMBEN-TETRACAINE-BENZOCAINE 2-2-14 % EX AERO
INHALATION_SPRAY | CUTANEOUS | Status: AC
  Administered 2016-10-11: 09:00:00
  Filled 2016-10-11: qty 20

## 2016-10-11 MED ORDER — FENTANYL CITRATE (PF) 100 MCG/2ML IJ SOLN
INTRAMUSCULAR | Status: AC | PRN
  Administered 2016-10-11: 25 ug via INTRAVENOUS

## 2016-10-11 MED ORDER — VANCOMYCIN HCL IN DEXTROSE 750-5 MG/150ML-% IV SOLN
750.0000 mg | Freq: Two times a day (BID) | INTRAVENOUS | Status: DC
  Administered 2016-10-11 – 2016-10-12 (×2): 750 mg via INTRAVENOUS
  Filled 2016-10-11 (×4): qty 150

## 2016-10-11 MED ORDER — MIDAZOLAM HCL 2 MG/2ML IJ SOLN
INTRAMUSCULAR | Status: AC | PRN
  Administered 2016-10-11 (×2): 1 mg via INTRAVENOUS

## 2016-10-11 MED ORDER — MIDAZOLAM HCL 5 MG/5ML IJ SOLN
INTRAMUSCULAR | Status: AC
  Filled 2016-10-11: qty 10

## 2016-10-11 MED ORDER — LIDOCAINE VISCOUS 2 % MT SOLN
OROMUCOSAL | Status: AC
  Administered 2016-10-11: 15 mL
  Filled 2016-10-11: qty 15

## 2016-10-11 MED ORDER — FENTANYL CITRATE (PF) 100 MCG/2ML IJ SOLN
INTRAMUSCULAR | Status: AC
  Filled 2016-10-11: qty 4

## 2016-10-11 NOTE — Progress Notes (Signed)
[place] at [place] NAME: Jill Copeland    MR#:  [id number]  DATE OF BIRTH:  [birth year]  SUBJECTIVE: Status post TEE this morning, unsuccessful, TEE, probe could not "beyond midesophagus., Spoke with cardiology.    CHIEF COMPLAINT:   Chief Complaint  Patient presents with  . Abnormal Lab    sent in for positive bx and positive vegatative endocarditis    REVIEW OF SYSTEMS:   ROS CONSTITUTIONAL: No fever, fatigue or weakness.  EYES: No blurred or double vision.  EARS, NOSE, AND THROAT: No tinnitus or ear pain.  RESPIRATORY: No cough, shortness of breath, wheezing or hemoptysis.  CARDIOVASCULAR: No chest pain, orthopnea, edema.  GASTROINTESTINAL: No nausea, vomiting, diarrhea or abdominal pain.  GENITOURINARY: No dysuria, hematuria.  ENDOCRINE: No polyuria, nocturia,  HEMATOLOGY: No anemia, easy bruising or bleeding SKIN: No rash or lesion. MUSCULOSKELETAL: No joint pain or arthritis.   NEUROLOGIC: No tingling, numbness, weakness.  PSYCHIATRY: No anxiety or depression.   DRUG ALLERGIES:     VITALS:  Blood pressure (!) 157/55, pulse (!) 55, temperature 98.1 F (36.7 C), temperature source Oral, resp. rate 18, height 5\' 3"  (1.6 m), weight 54.9 kg (121 lb), SpO2 95 %.  PHYSICAL EXAMINATION:  GENERAL:  83 y.o.-year-old patient lying in the bed with no acute distress.  EYES: Pupils equal, round, reactive to light and accommodation. No scleral icterus. Extraocular muscles intact.  HEENT: Head atraumatic, normocephalic. Oropharynx and nasopharynx clear.  NECK:  Supple, no jugular venous distention. No thyroid enlargement, no tenderness.  LUNGS: Normal breath sounds bilaterally, no wheezing, rales,rhonchi or crepitation. No use of accessory muscles of respiration.  CARDIOVASCULAR: S1, S2 normal. No murmurs, rubs, or gallops.  ABDOMEN: Soft, nontender, nondistended. Bowel sounds present. No organomegaly or mass.  EXTREMITIES: No  pedal edema, cyanosis, or clubbing.  NEUROLOGIC: Cranial nerves II through XII are intact. Muscle strength 5/5 in all extremities. Sensation intact. Gait not checked.  PSYCHIATRIC: The patient is alert and oriented x 3.  SKIN: No obvious rash, lesion, or ulcer.    LABORATORY PANEL:   CBC  Recent Labs Lab 10/10/16 0706  WBC 10.9  HGB 13.5  HCT 39.8  PLT 126*   ------------------------------------------------------------------------------------------------------------------  Chemistries   Recent Labs Lab 10/09/16 1435 10/11/16 1303  NA 139 133*  K 3.4* 4.0  CL 104 98*  CO2 28 25  GLUCOSE 115* 150*  BUN 25* 19  CREATININE 0.66 0.65  CALCIUM 8.2* 8.7*  AST 22  --   ALT 14  --   ALKPHOS 101  --   BILITOT 0.4  --    ------------------------------------------------------------------------------------------------------------------  Cardiac Enzymes No results for input(s): TROPONINI in the last 168 hours. ------------------------------------------------------------------------------------------------------------------  RADIOLOGY:  Ct Chest W Contrast  Result Date: 10/11/2016 CLINICAL DATA:  Recent discharge from hospital for sepsis, discharged with antibiotics, returns today because of possible vegetations by echocardiogram and right ventricle. Unsuccessful TEE today due to stricture. EXAM: CT CHEST WITH CONTRAST TECHNIQUE: Multidetector CT imaging of the chest was performed during intravenous contrast administration. CONTRAST:  26mL ISOVUE-300 IOPAMIDOL (ISOVUE-300) INJECTION 61% COMPARISON:  Chest CT dated 12/30/2014. FINDINGS: Cardiovascular: Heart size is normal. No pericardial effusion. No aortic aneurysm. Aortic atherosclerosis. Mediastinum/Nodes: Moderate mediastinal and supraclavicular lymphadenopathy is not significantly changed in the interval suggesting benignity. Hiatal hernia, moderate to large, appears stable. Surgical anastomosis above the level of the hiatal  hernia. Thickening of the esophageal walls above the level of the surgical change. Lungs/Pleura:  Again noted are patchy ill-defined and nodular consolidations throughout both lungs, again with a predominantly peripheral distribution, largest components not significantly changed compared to the previous exam, worsened in the right upper lobe. Upper Abdomen: No acute findings. Musculoskeletal: No acute or suspicious osseous findings. IMPRESSION: 1. Hiatal hernia, moderate to large in size, stable. Surgical anastomosis again appreciated above the level of the hiatal hernia, presumably at the site of the stricture described at today's TEE attempt. Walls of the esophagus above the surgical level are circumferentially thickened suggesting chronic reflux, less likely infectious or neoplastic. 2. The moderate mediastinal lymphadenopathy is not significantly changed compared to the CT chest of 12/30/2014 suggesting benignity. 3. Patchy consolidations throughout both lungs, most of which is not significantly changed compared to the previous chest CT of 12/30/2014, suggesting chronic versus recurrent infectious or inflammatory process. Worsened opacities are seen within the right upper lobe, presumably pneumonia given the history of recent sepsis. 4. Heart size is normal.  No pericardial effusion. 5. Aortic atherosclerosis. Aortic Atherosclerosis (ICD10-I70.0). Electronically Signed   By: Franki Cabot M.D.   On: 10/11/2016 14:38    EKG:   Orders placed or performed during the hospital encounter of 10/02/16  . EKG 12-Lead  . EKG 12-Lead    ASSESSMENT AND PLAN:   [age] year old female patient with a recent discharge from hospital for sepsis with staph hominis, discharge to assisted living facility with azithromycin, cefitin  comes back because of possible vegetations by echocardiogram in right ventricle. Continue vancomycin, Unsuccessful TEE, Dr. Fletcher Anon. Probe could not go beyond midesophagus due to resistance.  recommended CT chest with contrast to evaluate for possible esophageal stricture. Blood cultures this time did not show any growth.   2,Hypothyroid: Continue Synthroid  3. Alzheimer's dementia: Patient is from [place] 4. History of schizoaffective disorder: Continue outpatient medications  D/w daughter.LISA over phone,CT chest result discussed.h/oHiatal hernia surgery and anastomosis and chronic reflux . Also Pacemaker interogation showed battery is completely dead.    All the records are reviewed and case discussed with Care Management/Social Workerr. Management plans discussed with the patient, family and they are in agreement.  CODE STATUS: full  TOTAL TIME TAKING CARE OF THIS PATIENT: 35 minutes.   POSSIBLE D/C IN 1-2 DAYS, DEPENDING ON CLINICAL CONDITION.   [name] M.D on 10/11/2016 at 5:47 PM  Between 7am to 6pm - Pager - [phone]  After 6pm go to [email or web address] - password [email or web address] [facility]  [place] [facility]  Office  [phone]  CC: Primary care physician; Marco Collie, MD   Note: This dictation was prepared with Dragon dictation along with smaller phrase technology. Any transcriptional errors that result from this process are unintentional.

## 2016-10-11 NOTE — Progress Notes (Signed)
   [employer] rep coming to interrogate device today. Will have EP see the patient on 7/24.

## 2016-10-11 NOTE — Progress Notes (Signed)
[place] at [place] NAME: Jill Copeland    MR#:  [id number]  DATE OF BIRTH:  [birth year]  SUBJECTIVE: Status post TEE this morning, unsuccessful, TEE, probe could not "beyond midesophagus., Spoke with cardiology.    CHIEF COMPLAINT:   Chief Complaint  Patient presents with  . Abnormal Lab    sent in for positive bx and positive vegatative endocarditis    REVIEW OF SYSTEMS:   ROS CONSTITUTIONAL: No fever, fatigue or weakness.  EYES: No blurred or double vision.  EARS, NOSE, AND THROAT: No tinnitus or ear pain.  RESPIRATORY: No cough, shortness of breath, wheezing or hemoptysis.  CARDIOVASCULAR: No chest pain, orthopnea, edema.  GASTROINTESTINAL: No nausea, vomiting, diarrhea or abdominal pain.  GENITOURINARY: No dysuria, hematuria.  ENDOCRINE: No polyuria, nocturia,  HEMATOLOGY: No anemia, easy bruising or bleeding SKIN: No rash or lesion. MUSCULOSKELETAL: No joint pain or arthritis.   NEUROLOGIC: No tingling, numbness, weakness.  PSYCHIATRY: No anxiety or depression.   DRUG ALLERGIES:     VITALS:  Blood pressure (!) 157/55, pulse (!) 55, temperature 98.1 F (36.7 C), temperature source Oral, resp. rate 18, height 5\' 3"  (1.6 m), weight 54.9 kg (121 lb), SpO2 95 %.  PHYSICAL EXAMINATION:  GENERAL:  83 y.o.-year-old patient lying in the bed with no acute distress.  EYES: Pupils equal, round, reactive to light and accommodation. No scleral icterus. Extraocular muscles intact.  HEENT: Head atraumatic, normocephalic. Oropharynx and nasopharynx clear.  NECK:  Supple, no jugular venous distention. No thyroid enlargement, no tenderness.  LUNGS: Normal breath sounds bilaterally, no wheezing, rales,rhonchi or crepitation. No use of accessory muscles of respiration.  CARDIOVASCULAR: S1, S2 normal. No murmurs, rubs, or gallops.  ABDOMEN: Soft, nontender, nondistended. Bowel sounds present. No organomegaly or mass.  EXTREMITIES: No  pedal edema, cyanosis, or clubbing.  NEUROLOGIC: Cranial nerves II through XII are intact. Muscle strength 5/5 in all extremities. Sensation intact. Gait not checked.  PSYCHIATRIC: The patient is alert and oriented x 3.  SKIN: No obvious rash, lesion, or ulcer.    LABORATORY PANEL:   CBC  Recent Labs Lab 10/10/16 0706  WBC 10.9  HGB 13.5  HCT 39.8  PLT 126*   ------------------------------------------------------------------------------------------------------------------  Chemistries   Recent Labs Lab 10/09/16 1435  NA 139  K 3.4*  CL 104  CO2 28  GLUCOSE 115*  BUN 25*  CREATININE 0.66  CALCIUM 8.2*  AST 22  ALT 14  ALKPHOS 101  BILITOT 0.4   ------------------------------------------------------------------------------------------------------------------  Cardiac Enzymes No results for input(s): TROPONINI in the last 168 hours. ------------------------------------------------------------------------------------------------------------------  RADIOLOGY:  No results found.  EKG:   Orders placed or performed during the hospital encounter of 10/02/16  . EKG 12-Lead  . EKG 12-Lead    ASSESSMENT AND PLAN:   [age] year old female patient with a recent discharge from hospital for sepsis with staph hominis, discharge to assisted living facility with azithromycin, cefitin  comes back because of possible vegetations by echocardiogram in right ventricle. Continue vancomycin, Unsuccessful TEE, Dr. Fletcher Anon. Probe could not go beyond midesophagus due to resistance. recommended CT chest with contrast to evaluate for possible esophageal stricture. Blood cultures this time did not show any growth.   2,Hypothyroid: Continue Synthroid  3. Alzheimer's dementia: Patient is from [place] Cop[place] 4. History of schizoaffective disorder: Continue outpatient medications    All the records are reviewed and case discussed with Care Management/Social Workerr. Management plans  discussed with the patient, family and they are  in agreement.  CODE STATUS: full  TOTAL TIME TAKING CARE OF THIS PATIENT: 35 minutes.   POSSIBLE D/C IN 1-2 DAYS, DEPENDING ON CLINICAL CONDITION.   [name] M.D on 10/11/2016 at 1:25 PM  Between 7am to 6pm - Pager - [phone]  After 6pm go to [email or web address] - password [email or web address] [facility]  [place] [facility]  Office  [phone]  CC: Primary care physician; Marco Collie, MD   Note: This dictation was prepared with Dragon dictation along with smaller phrase technology. Any transcriptional errors that result from this process are unintentional.

## 2016-10-11 NOTE — Consult Note (Signed)
Cardiology Consultation Note  Patient ID: Jill Copeland, MRN: [id number], DOB/AGE: 07-10-1931 83 y.o. Admit date: 10/09/2016   Date of Consult: 10/11/2016 Primary Physician: Marco Collie, MD Primary Cardiologist: New to [facility] - consult by Fletcher Anon, MD Requesting Physician: Dr. Benjie Karvonen, MD  Chief Complaint: Abnormal TTE Reason for Consult: Possible PPM lead vegetation  HPI: Jill Copeland is a 83 y.o. female who is being seen today for the evaluation of possible PPM lead vegetation at the request of Dr. Benjie Karvonen, MD. Patient has a h/o Alzheimer's disease, symptomatic bradycardia s/p Biotronik PPM implanted in 02/2005 at [facility] in [place], [place] (verifeid with Biotronik), dysphagia with pulmonary aspiration, esophageal stricture, hypothyroidism, DM2, stroke, schizo affective disorder and recent admission for sepsis 2/2 possibly PPM lead vegetation and multifocal PNA.   She previously lived in [place], [place] and had a Biotronik PPM implanted 02/2005 for symptomatic bradycardia by Dr. Clover Mealy, MD. She has not followed up with a cardiologist since relocating to [facility].   Patient was recently admitted to [facility] from 7/14 through 10/05/16 for sepsis with bacteremia growing Staph hominis and multifocal pneumonia. She was treated with vancomycin followed by Ceftin and Zithromax. TTE at time of discharge showed EF 60-65%, no RWMA, study not technically sufficient to allow for evaluation of LV diastolic function. The aortic valve was suboptimal visualized but appear sclerotic with vegetation. TEE was advised. She was discharged prior to reading of TTE and upon the results, she was advised to return to [facility] ED for further evaluation of possible vegetation of the pacemaker lead. She underwent TEE this morning, but that was unsuccessful as the TEE probe beyond the the mid esophagus due to resistance suggestive of stricture or diverticulum. Only part of the aortic valve and proximal aorta were visualized. It was  recommended the patient undergo upper GI Barium swallow study to evaluate this and consideration of CT chest to evaluate for probable pacemaker lead vegetation. Upon admission, her leukocytosis has improved to 10.9. She was started on vancomycin.    Past Medical History:  Diagnosis Date  . Alzheimer disease   . Arrhythmia    with syncope s/p PPM  . Arthritis   . Bacteremia    several timesf  . Diabetes mellitus without complication ([place])   . Dysphagia causing pulmonary aspiration with swallowing   . Esophageal stricture   . GERD (gastroesophageal reflux disease)   . Hiatal hernia    s/p repair with fistula  . Hypothyroidism   . Pneumococcal pneumonia ([place])   . Recurrent pneumonia   . Recurrent UTI   . Schizo affective schizophrenia ([place])   . Stroke (cerebrum) ([place])   . Symptomatic bradycardia    a. s/p biotronik PPM 02/2005 at [facility] in [place], [place]      Most Recent Cardiac Studies: TTE 10/05/2016: Study Conclusions  - Left ventricle: The cavity size was normal. Wall thickness was   increased in a pattern of mild LVH. Systolic function was normal.   The estimated ejection fraction was in the range of 60% to 65%.   Wall motion was normal; there were no regional wall motion   abnormalities. The study is not technically sufficient to allow   evaluation of LV diastolic function. - Aortic valve: Probably trileaflet; mildly thickened, mildly   calcified leaflets. The valve is suboptimally imaged but appears   sclerotic with definite vegitation. However, if clinical concern   for endocarditis persists, TEE is recommended. - Left atrium: The atrium was mildly dilated. -  Right ventricle: The cavity size was normal. Pacer wire or   catheter noted in right ventricle. The lead is incompletely   visualized and vegitation cannot be excluded on this   transthoracic study. Systolic function was normal. - Tricuspid valve: There was mild-moderate regurgitation. - Pulmonary  arteries: Systolic pressure was moderately increased,   in the range of 45 mm Hg to 50 mm Hg. - Pericardium, extracardiac: A trivial pericardial effusion was   identified posterior to the heart.  TEE 10/11/2016: Impressions:  - I could not advance the TEE probe beyond the the mid esophagus   due to resistance suggestive of stricture or diverticulum. only   part of the aortic valve and proximal aorta were visualized.     Recommend an upper GI barium study to evaluate this. Consider CT   chest to evaluate probable pacemaker lead vegetation.   Surgical History:  Past Surgical History:  Procedure Laterality Date  . HERNIA REPAIR    . JEJUNOSTOMY FEEDING TUBE    . PACEMAKER INSERTION       Home Meds: Prior to Admission medications   Medication Sig Start Date End Date Taking? Authorizing Provider  acetaminophen (TYLENOL) 500 MG tablet Take 500 mg by mouth every 4 (four) hours as needed.   Yes [provider]  alum & mag hydroxide-simeth (MINTOX) 200-200-20 MG/5ML suspension Take 30 mLs by mouth as needed for indigestion or heartburn.   Yes [provider]  azithromycin (ZITHROMAX) 250 MG tablet Take 1 tablet (250 mg total) by mouth daily. 10/05/16  Yes Fritzi Mandes, MD  cefUROXime (CEFTIN) 500 MG tablet Take 1 tablet (500 mg total) by mouth 2 (two) times daily with a meal. 10/05/16 10/12/16 Yes Fritzi Mandes, MD  cetirizine (ZYRTEC) 10 MG tablet Take 10 mg by mouth daily.   Yes [provider]  Cholecalciferol 2000 units TABS Take 2,000 Units by mouth daily.   Yes [provider]  divalproex (DEPAKOTE ER) 500 MG 24 hr tablet Take 500 mg by mouth at bedtime.   Yes [provider]  docusate sodium (COLACE) 100 MG capsule Take 100 mg by mouth 2 (two) times daily.   Yes [provider]  feeding supplement, ENSURE ENLIVE, (ENSURE ENLIVE) LIQD Take 237 mLs by mouth 2 (two) times daily between meals. 10/05/16  Yes Fritzi Mandes, MD  ferrous sulfate  325 (65 FE) MG tablet Take 325 mg by mouth daily with breakfast.   Yes [provider]  fluticasone (FLONASE) 50 MCG/ACT nasal spray Place 2 sprays into both nostrils daily.   Yes [provider]  furosemide (LASIX) 20 MG tablet Take 20 mg by mouth every other day.   Yes [provider]  gabapentin (NEURONTIN) 100 MG capsule Take 200 mg by mouth 3 (three) times daily.   Yes [provider]  guaifenesin (ROBITUSSIN) 100 MG/5ML syrup Take 200 mg by mouth 4 (four) times daily as needed for cough.   Yes [provider]  levothyroxine (SYNTHROID, LEVOTHROID) 125 MCG tablet Take 125 mcg by mouth daily before breakfast.    Yes [provider]  loperamide (IMODIUM) 2 MG capsule Take 2 mg by mouth as needed for diarrhea or loose stools.   Yes [provider]  magnesium hydroxide (MILK OF MAGNESIA) 400 MG/5ML suspension Take 30 mLs by mouth daily as needed for mild constipation.   Yes [provider]  Melatonin 3 MG TABS Take 3 mg by mouth at bedtime.   Yes [provider]  metoprolol tartrate (LOPRESSOR) 25 MG tablet Take 12.5 mg by mouth 2 (two) times daily.   Yes [provider]  neomycin-bacitracin-polymyxin (NEOSPORIN) ointment Apply 1 application topically as needed for wound care. apply to eye   Yes [provider]  ondansetron (ZOFRAN) 8 MG tablet Take 8 mg by mouth every 8 (eight) hours as needed for nausea or vomiting.   Yes [provider]  potassium chloride (K-DUR,KLOR-CON) 10 MEQ tablet Take 10 mEq by mouth every other day.   Yes [provider]  rivastigmine (EXELON) 4.6 mg/24hr Place 4.6 mg onto the skin daily.   Yes [provider]  saccharomyces boulardii (FLORASTOR) 250 MG capsule Take 1 capsule (250 mg total) by mouth 2 (two) times daily. Patient taking differently: Take 250 mg by mouth daily.  01/01/15  Yes Hosie Poisson, MD  senna (SENOKOT) 8.6 MG TABS tablet Take  1 tablet by mouth 2 (two) times a week. Mondays and Fridays   Yes [provider]  sertraline (ZOLOFT) 50 MG tablet Take 50 mg by mouth at bedtime.   Yes [provider]  sodium chloride 1 G tablet Take 1 g by mouth 3 (three) times daily.   Yes [provider]  traMADol (ULTRAM) 50 MG tablet Take 1 tablet (50 mg total) by mouth 2 (two) times daily as needed for moderate pain. Patient taking differently: Take 50 mg by mouth 2 (two) times daily.  01/14/15  Yes [name], MD    Inpatient Medications:  . cholecalciferol  2,000 Units Oral Daily  . divalproex  500 mg Oral QHS  . docusate sodium  100 mg Oral BID  . enoxaparin (LOVENOX) injection  40 mg Subcutaneous Q24H  . feeding supplement (ENSURE ENLIVE)  237 mL Oral BID BM  . fentaNYL      . ferrous sulfate  325 mg Oral Q breakfast  . fluticasone  2 spray Each Nare Daily  . furosemide  20 mg Oral QODAY  . gabapentin  200 mg Oral TID  . levothyroxine  125 mcg Oral QAC breakfast  . loratadine  10 mg Oral Daily  . Melatonin  2.5 mg Oral QHS  . metoprolol tartrate  12.5 mg Oral BID  . midazolam      . rivastigmine  4.6 mg Transdermal Daily  . saccharomyces boulardii  250 mg Oral Daily  . senna  1 tablet Oral Once per day on Mon Fri  . sertraline  50 mg Oral QHS  . sodium chloride  1 g Oral TID   . vancomycin Stopped (10/11/16 1106)    Allergies:  Allergies  Allergen Reactions  . Penicillins Other (See Comments)      . Sertraline   . Zoloft [Sertraline Hcl]     Social History   Social History  . Marital status: Widowed    Spouse name: N/A  . Number of children: N/A  . Years of education: N/A   Occupational History  . Not on file.   Social History Main Topics  . Smoking status: Never Smoker  . Smokeless tobacco: Never Used  . Alcohol use No  . Drug use: No  . Sexual activity: Not on file   Other Topics Concern  . Not on file   Social History Narrative  . No narrative on file       Family History  Problem Relation Age of Onset  . Thyroid cancer Grandchild   . Thyroid nodules Daughter   . Thyroid cancer Daughter   .  Dementia Mother   . Schizophrenia Maternal Aunt      Review of Systems: Review of Systems  Unable to perform ROS: Dementia    Labs: No results for input(s): CKTOTAL, CKMB, TROPONINI in the last 72 hours. Lab Results  Component Value Date   WBC 10.9 10/10/2016   HGB 13.5 10/10/2016   HCT 39.8 10/10/2016   MCV 89.7 10/10/2016   PLT 126 (L) 10/10/2016     Recent Labs Lab 10/09/16 1435  NA 139  K 3.4*  CL 104  CO2 28  BUN 25*  CREATININE 0.66  CALCIUM 8.2*  PROT 6.8  BILITOT 0.4  ALKPHOS 101  ALT 14  AST 22  GLUCOSE 115*   No results found for: CHOL, HDL, LDLCALC, TRIG No results found for: DDIMER  Radiology/Studies:  Dg Chest 2 View  Result Date: 10/02/2016 IMPRESSION: Extensive patchy opacities throughout both lungs worrisome for airspace disease. Electronically Signed   By: Marybelle Killings M.D.   On: 10/02/2016 13:03    EKG: Interpreted by me showed: not performed Telemetry: Interpreted by me showed: sinus bradycardia, upper 50s bpm, no pacer spikes noted  Weights: Filed Weights   10/09/16 1335  Weight: 121 lb (54.9 kg)     Physical Exam: Blood pressure (!) 157/55, pulse (!) 55, temperature 98.1 F (36.7 C), temperature source Oral, resp. rate 18, height 5\' 3"  (1.6 m), weight 121 lb (54.9 kg), SpO2 95 %. Body mass index is 21.43 kg/m. General: Well developed, well nourished, in no acute distress. Head: Normocephalic, atraumatic, sclera non-icteric, no xanthomas, nares are without discharge.  Neck: Negative for carotid bruits. JVD not elevated. Lungs: Clear bilaterally to auscultation without wheezes, rales, or rhonchi. Breathing is unlabored. Heart: Bradycardic with S1 S2. No murmurs, rubs, or gallops appreciated. Abdomen: Soft, non-tender, non-distended with normoactive bowel sounds. No hepatomegaly. No  rebound/guarding. No obvious abdominal masses. Msk:  Strength and tone appear normal for age. Extremities: No clubbing or cyanosis. No edema. Distal pedal pulses are 2+ and equal bilaterally. Neuro: Alert. No facial asymmetry. No focal deficit. Moves all extremities spontaneously. Psych:  Responds to questions appropriately with a normal affect.    Assessment and Plan:  Principal Problem:   Pacemaker infection ([place]) Active Problems:   Schizoaffective disorder, unspecified type ([place])   Dementia in chronic schizophrenia ([place])   Alzheimer disease   Recurrent pneumonia   Endocarditis    1. Possible pacemaker lead infection: -Noted on TTE at time of discharge from admission the week prior -TEE unable to be successfully completed on the morning of 7/23 secondary to inability to pass the TEE probe down the esophagus -Consider CT chest for further evaluation of pacemaker lead infection -Vancomycin per IM -Consider ID consult  2. Symptomatic bradycardia: -Status post Biotronik PPM implanted 02/2005 at [facility] in [place], [place] -Will have rep come out to interrogate device  -EP to see the patient on 7/24 -Device may need to be explanted given the above, per EP  3. Recent sepsis with multifocal PNA and possible pacemaker lead infection: -Vancomycin per IM -May need device explantation as above, defer to EP  4. Alzheimer's dementia: -Per IM    Signed, Christell Faith, PA-C [place] Pager: [phone] 10/11/2016, 12:21 PM

## 2016-10-11 NOTE — Consult Note (Signed)
Pharmacy Antibiotic Note  Jill Copeland is a 83 y.o. female admitted on 10/09/2016 with bacteremia and possible endocarditis. Bcx positive for staph hominis.  Pharmacy has been consulted for vancoymcin dosing. Patient has an unknown PCN allergy. Pt TTE showed vegitation. TEE was recommended.  Plan: VT = 12 mcg/mL on 500 mg IV q12h. Patient is not obese so don't anticipate accumulation.  Will increase vancomycin dose to 750 mg IV q12h to achieve higher trough of 15-20 mcg/mL for endocarditis.  VT ordered prior to 5th dose of new regimen. Follow renal function closely.   Height: 5\' 3"  (160 cm) Weight: 121 lb (54.9 kg) IBW/kg (Calculated) : 52.4  Temp (24hrs), Avg:98.4 F (36.9 C), Min:98.1 F (36.7 C), Max:98.6 F (37 C)   Recent Labs Lab 10/04/16 2342 10/09/16 1435 10/10/16 0706 10/11/16 1303 10/11/16 1909  WBC  --  14.3* 10.9  --   --   CREATININE  --  0.66  --  0.65 0.91  VANCOTROUGH 6*  --   --   --  12*    Estimated Creatinine Clearance: 38.7 mL/min (by C-G formula based on SCr of 0.91 mg/dL).     Antimicrobials this admission: vancomycin 7/12 >>    Dose adjustments this admission:  Microbiology results: 7/14 BCx: staph hominis 7/17 Bcx: NG   Thank you for allowing pharmacy to be a part of this patient's care.  Lenis Noon, Pharm.D, BCPS Clinical Pharmacist

## 2016-10-11 NOTE — CV Procedure (Signed)
TEE was attempted. The probe was intubated into the esophagus but could not be advanced beyond the mid point of the esophagus likely due to a suspected stricture or diverticulum. Thus, seizure could not be completed. Only the proximal part of the aorta and part of the aortic valve was visualized.  I reviewed the transthoracic echo images. I don't think there is aortic valve vegetation. However, there is a mass in the right atrium likely that the pacemaker lead that represents a possible vegetation. Given that TEE is not possible, CT chest should be considered as this might be helpful in visualizing this.

## 2016-10-11 NOTE — Care Management (Signed)
Sent to ED from [facility] due to concern of endocarditis showing on an outpatient echo. Recent discharge to [place] living with home health through [facility]- SN and PT.  Notified [facility]. Patient had been a previous resident at [facility] but did not want to return to that facility at time of last discharge. TEE attempted but unable to comlete to assess for possible valve vegetation this day so it is recommended a chest CT be considered

## 2016-10-12 ENCOUNTER — Encounter: Payer: Self-pay | Admitting: Cardiovascular Disease

## 2016-10-12 ENCOUNTER — Inpatient Hospital Stay (HOSPITAL_COMMUNITY)
Admit: 2016-10-12 | Discharge: 2016-10-12 | Disposition: A | Discharge status: Home / Self Care | Payer: Medicare Other | Attending: Cardiovascular Disease | Admitting: Cardiovascular Disease

## 2016-10-12 DIAGNOSIS — I361 Nonrheumatic tricuspid (valve) insufficiency: Secondary | ICD-10-CM

## 2016-10-12 LAB — ECHOCARDIOGRAM LIMITED
Height: 63 in
Weight: 1936 oz

## 2016-10-12 LAB — CREATININE, SERUM
Creatinine, Ser: 0.87 mg/dL (ref 0.44–1.00)
GFR, EST NON AFRICAN AMERICAN: 60 mL/min — AB (ref >60)

## 2016-10-12 MED ORDER — RIFAMPIN 300 MG PO CAPS
300.0000 mg | ORAL_CAPSULE | Freq: Three times a day (TID) | ORAL | Status: DC
  Administered 2016-10-12 – 2016-10-14 (×7): 300 mg via ORAL
  Filled 2016-10-12 (×8): qty 1

## 2016-10-12 MED ORDER — RIFAMPIN 300 MG PO CAPS
300.0000 mg | ORAL_CAPSULE | Freq: Three times a day (TID) | ORAL | Status: DC
  Filled 2016-10-12 (×2): qty 1

## 2016-10-12 MED ORDER — DEXTROSE 5 % IV SOLN
3.0000 g | Freq: Three times a day (TID) | INTRAVENOUS | Status: DC
  Administered 2016-10-12 – 2016-10-14 (×7): 3 g via INTRAVENOUS
  Filled 2016-10-12 (×11): qty 3000

## 2016-10-12 NOTE — Progress Notes (Signed)
Chaplain visited with patient, providing words of encouragement and ministry of presence, while on rounds.     10/12/16 1640  Clinical Encounter Type  Visited With Patient  Visit Type Initial;Spiritual support  Referral From Chaplain  Consult/Referral To Chaplain  Spiritual Encounters  Spiritual Needs Other (Comment)

## 2016-10-12 NOTE — Consult Note (Signed)
ELECTROPHYSIOLOGY CONSULT NOTE  Patient ID: Jill Copeland, MRN: [id number], DOB/AGE: 12-04-1931 83 y.o. Admit date: 10/09/2016 Date of Consult: 10/12/2016  Primary Physician: Marco Collie, MD Primary Cardiologist:  Steele Sizer Tieara Copeland is a 83 y.o. female who is being seen today for the evaluation of Staph Hominis Bacteremia in the setting of her previously implanted pacemaker at the request of Dr Audelia Acton.     HPI Jill Copeland is a 83 y.o. female  With the previously implanted pacemaker apparently at end of service who was found to have a vegetation on her aortic valve during her last hospitalization. This was undertaken for double pneumonia. Blood cultures at that time had demonstrated bacteremia with a staph hominis species. Its association with her device was apparently not appreciated.  Her vancomycin was changed to Ceftin and Zithromax and she was discharged on oral antibiotics. An echocardiogram was obtained as part of the evaluation and was notable for a "definite vegetation" on the aortic valve associated with sclerosis; nothing on the leads was identified.  Efforts to undertake TEE failed. CT scanning had demonstrated a surgical anastomosis in her esophagus presumably the cause for the failure to intubate the probe.  She has a history of prior esophageal stricture with dilatation. She has a history of chronic dysphagia associated with aspiration pneumonia.  She has a complex medical history in addition including bipolar disorder modest dementia        Past Medical History:  Diagnosis Date  . Alzheimer disease   . Arrhythmia    with syncope s/p PPM  . Arthritis   . Bacteremia    several timesf  . Diabetes mellitus without complication ([place])   . Dysphagia causing pulmonary aspiration with swallowing   . Esophageal stricture   . GERD (gastroesophageal reflux disease)   . Hiatal hernia    s/p repair with fistula  . Hypothyroidism   . Pneumococcal pneumonia  ([place])   . Recurrent pneumonia   . Recurrent UTI   . Schizo affective schizophrenia ([place])   . Stroke (cerebrum) ([place])   . Symptomatic bradycardia    a. s/p biotronik PPM 02/2005 at [facility] in [place], [place]      Surgical History:  Past Surgical History:  Procedure Laterality Date  . HERNIA REPAIR    . JEJUNOSTOMY FEEDING TUBE    . PACEMAKER INSERTION    . TEE WITHOUT CARDIOVERSION N/A 10/11/2016   Procedure: TRANSESOPHAGEAL ECHOCARDIOGRAM (TEE);  Surgeon: Wellington Hampshire, MD;  Location: [facility] ORS;  Service: Cardiovascular;  Laterality: N/A;     Home Meds: Prior to Admission medications   Medication Sig Start Date End Date Taking? Authorizing Provider  acetaminophen (TYLENOL) 500 MG tablet Take 500 mg by mouth every 4 (four) hours as needed.   Yes [provider]  alum & mag hydroxide-simeth (MINTOX) 200-200-20 MG/5ML suspension Take 30 mLs by mouth as needed for indigestion or heartburn.   Yes [provider]  azithromycin (ZITHROMAX) 250 MG tablet Take 1 tablet (250 mg total) by mouth daily. 10/05/16  Yes Fritzi Mandes, MD  cefUROXime (CEFTIN) 500 MG tablet Take 1 tablet (500 mg total) by mouth 2 (two) times daily with a meal. 10/05/16 10/12/16 Yes Fritzi Mandes, MD  cetirizine (ZYRTEC) 10 MG tablet Take 10 mg by mouth daily.   Yes [provider]  Cholecalciferol 2000 units TABS Take 2,000 Units by mouth daily.   Yes [provider]  divalproex (DEPAKOTE ER) 500 MG 24 hr tablet Take  500 mg by mouth at bedtime.   Yes [provider]  docusate sodium (COLACE) 100 MG capsule Take 100 mg by mouth 2 (two) times daily.   Yes [provider]  feeding supplement, ENSURE ENLIVE, (ENSURE ENLIVE) LIQD Take 237 mLs by mouth 2 (two) times daily between meals. 10/05/16  Yes Fritzi Mandes, MD  ferrous sulfate 325 (65 FE) MG tablet Take 325 mg by mouth daily with breakfast.   Yes [provider]  fluticasone (FLONASE) 50 MCG/ACT nasal  spray Place 2 sprays into both nostrils daily.   Yes [provider]  furosemide (LASIX) 20 MG tablet Take 20 mg by mouth every other day.   Yes [provider]  gabapentin (NEURONTIN) 100 MG capsule Take 200 mg by mouth 3 (three) times daily.   Yes [provider]  guaifenesin (ROBITUSSIN) 100 MG/5ML syrup Take 200 mg by mouth 4 (four) times daily as needed for cough.   Yes [provider]  levothyroxine (SYNTHROID, LEVOTHROID) 125 MCG tablet Take 125 mcg by mouth daily before breakfast.    Yes [provider]  loperamide (IMODIUM) 2 MG capsule Take 2 mg by mouth as needed for diarrhea or loose stools.   Yes [provider]  magnesium hydroxide (MILK OF MAGNESIA) 400 MG/5ML suspension Take 30 mLs by mouth daily as needed for mild constipation.   Yes [provider]  Melatonin 3 MG TABS Take 3 mg by mouth at bedtime.   Yes [provider]  metoprolol tartrate (LOPRESSOR) 25 MG tablet Take 12.5 mg by mouth 2 (two) times daily.   Yes [provider]  neomycin-bacitracin-polymyxin (NEOSPORIN) ointment Apply 1 application topically as needed for wound care. apply to eye   Yes [provider]  ondansetron (ZOFRAN) 8 MG tablet Take 8 mg by mouth every 8 (eight) hours as needed for nausea or vomiting.   Yes [provider]  potassium chloride (K-DUR,KLOR-CON) 10 MEQ tablet Take 10 mEq by mouth every other day.   Yes [provider]  rivastigmine (EXELON) 4.6 mg/24hr Place 4.6 mg onto the skin daily.   Yes [provider]  saccharomyces boulardii (FLORASTOR) 250 MG capsule Take 1 capsule (250 mg total) by mouth 2 (two) times daily. Patient taking differently: Take 250 mg by mouth daily.  01/01/15  Yes Hosie Poisson, MD  senna (SENOKOT) 8.6 MG TABS tablet Take 1 tablet by mouth 2 (two) times a week. Mondays and Fridays   Yes [provider]  sertraline (ZOLOFT) 50 MG tablet Take 50 mg  by mouth at bedtime.   Yes [provider]  sodium chloride 1 G tablet Take 1 g by mouth 3 (three) times daily.   Yes [provider]  traMADol (ULTRAM) 50 MG tablet Take 1 tablet (50 mg total) by mouth 2 (two) times daily as needed for moderate pain. Patient taking differently: Take 50 mg by mouth 2 (two) times daily.  01/14/15  Yes [name], MD    Inpatient Medications:  . cholecalciferol  2,000 Units Oral Daily  . divalproex  500 mg Oral QHS  . docusate sodium  100 mg Oral BID  . enoxaparin (LOVENOX) injection  40 mg Subcutaneous Q24H  . feeding supplement (ENSURE ENLIVE)  237 mL Oral BID BM  . ferrous sulfate  325 mg Oral Q breakfast  . fluticasone  2 spray Each Nare Daily  . furosemide  20 mg Oral QODAY  . gabapentin  200 mg Oral TID  .  levothyroxine  125 mcg Oral QAC breakfast  . loratadine  10 mg Oral Daily  . Melatonin  2.5 mg Oral QHS  . metoprolol tartrate  12.5 mg Oral BID  . rivastigmine  4.6 mg Transdermal Daily  . saccharomyces boulardii  250 mg Oral Daily  . senna  1 tablet Oral Once per day on Mon Fri  . sertraline  50 mg Oral QHS  . sodium chloride  1 g Oral TID      Allergies:  Allergies  Allergen Reactions  . Penicillins Other (See Comments)       . Sertraline   . Zoloft [Sertraline Hcl]    Alert and oriented in no acute distress HENT- normal edentulous Eyes- EOMI, without scleral icterus  Skin- warm and dry; without rashes LN-neg Neck- supple without thyromegaly, JVP-flat, carotids brisk and full without bruits Back-without CVAT or kyphosis Lungs-clear to auscultation CV-Regular rate and rhythm, nl S1 and S2, no murmurs gallops or rubs, S4-present  Abd-soft with active bowel sounds; no midline pulsation or hepatomegaly Pulses-intact femoral and distal MKS-without gross deformity Neuro- Ax O, CN3-12 intact, grossly normal motor and sensory function Affect engaging   Labs: Cardiac Enzymes No results for input(s):  CKTOTAL, CKMB, TROPONINI in the last 72 hours. CBC Lab Results  Component Value Date   WBC 10.9 10/10/2016   HGB 13.5 10/10/2016   HCT 39.8 10/10/2016   MCV 89.7 10/10/2016   PLT 126 (L) 10/10/2016   PROTIME: No results for input(s): LABPROT, INR in the last 72 hours. Chemistry  Recent Labs Lab 10/09/16 1435 10/11/16 1303  10/12/16 0652  NA 139 133*  --   --   K 3.4* 4.0  --   --   CL 104 98*  --   --   CO2 28 25  --   --   BUN 25* 19  --   --   CREATININE 0.66 0.65  < > 0.87  CALCIUM 8.2* 8.7*  --   --   PROT 6.8  --   --   --   BILITOT 0.4  --   --   --   ALKPHOS 101  --   --   --   ALT 14  --   --   --   AST 22  --   --   --   GLUCOSE 115* 150*  --   --   < > = values in this interval not displayed. Lipids No results found for: CHOL, HDL, LDLCALC, TRIG BNP No results found for: PROBNP Thyroid Function Tests: No results for input(s): TSH, T4TOTAL, T3FREE, THYROIDAB in the last 72 hours.  Invalid input(s): FREET3    Miscellaneous No results found for: DDIMER  Radiology/Studies:  Dg Chest 2 View  Result Date: 10/02/2016 CLINICAL DATA:  Cough EXAM: CHEST  2 VIEW COMPARISON:  12/02/2015 FINDINGS: Heterogeneous opacities have developed throughout both lungs. Upper normal heart size. Left subclavian pacemaker device and leads are stable and intact. No pneumothorax. Tiny pleural effusions are suspected based on blunting of the posterior costophrenic angles. Osteopenia. An upper thoracic compression deformity is not significantly changed compared with 12/30/2014 IMPRESSION: Extensive patchy opacities throughout both lungs worrisome for airspace disease. Electronically Signed   By: Marybelle Killings M.D.   On: 10/02/2016 13:03   Ct Chest W Contrast  Result Date: 10/11/2016 CLINICAL DATA:  Recent discharge from hospital for sepsis, discharged with antibiotics, returns today because of possible vegetations by echocardiogram and right ventricle. Unsuccessful  TEE today due to  stricture. EXAM: CT CHEST WITH CONTRAST TECHNIQUE: Multidetector CT imaging of the chest was performed during intravenous contrast administration. CONTRAST:  31mL ISOVUE-300 IOPAMIDOL (ISOVUE-300) INJECTION 61% COMPARISON:  Chest CT dated 12/30/2014. FINDINGS: Cardiovascular: Heart size is normal. No pericardial effusion. No aortic aneurysm. Aortic atherosclerosis. Mediastinum/Nodes: Moderate mediastinal and supraclavicular lymphadenopathy is not significantly changed in the interval suggesting benignity. Hiatal hernia, moderate to large, appears stable. Surgical anastomosis above the level of the hiatal hernia. Thickening of the esophageal walls above the level of the surgical change. Lungs/Pleura: Again noted are patchy ill-defined and nodular consolidations throughout both lungs, again with a predominantly peripheral distribution, largest components not significantly changed compared to the previous exam, worsened in the right upper lobe. Upper Abdomen: No acute findings. Musculoskeletal: No acute or suspicious osseous findings. IMPRESSION: 1. Hiatal hernia, moderate to large in size, stable. Surgical anastomosis again appreciated above the level of the hiatal hernia, presumably at the site of the stricture described at today's TEE attempt. Walls of the esophagus above the surgical level are circumferentially thickened suggesting chronic reflux, less likely infectious or neoplastic. 2. The moderate mediastinal lymphadenopathy is not significantly changed compared to the CT chest of 12/30/2014 suggesting benignity. 3. Patchy consolidations throughout both lungs, most of which is not significantly changed compared to the previous chest CT of 12/30/2014, suggesting chronic versus recurrent infectious or inflammatory process. Worsened opacities are seen within the right upper lobe, presumably pneumonia given the history of recent sepsis. 4. Heart size is normal.  No pericardial effusion. 5. Aortic atherosclerosis.  Aortic Atherosclerosis (ICD10-I70.0). Electronically Signed   By: Franki Cabot M.D.   On: 10/11/2016 14:38    EKG:  Personally reviewed from 10/02/16. Sinus rhythm with biatrial enlargement LVH with repolarization abnormalities   Assessment and Plan:   Sinus node dysfunction with a previously implanted pacemaker  Pacemaker-Biotronik apparently at end of service    staph hominis bacteremia  Aortic valve vegetation  Bipolar disorder/or dementia  The patient has staph hominis bacteremia. In discussions with Dr. Jerilynn Som disease and cone-this organism is associated with a biofilm similar to staph epidermidis which makes it unlikely to be able to be eradicated from the device. At this juncture, however, it is not clear that the device is infected. The echocardiogram was notable for the vegetation on aortic valve which will require intravenous antibiotics. Her recommendation at this juncture would be 4-6 weeks of Ancef with a determination be made by Dr. Ola Spurr following his return. In addition, she should receive rifampin. She could receive her antibiotics as an outpatient.  Recommended dose of cefazolin is 3 g every 8 is obviously has to be determined in the context of her supposedly penicillin allergy although she was taking Ceftin before. The dose for her rifampin 300 mg every 8  Associated with her IV antibiotic course 3 options present themselves.  The first would be extraction at any juncture because of the concern that the device is infected and the presence of the biofilm make it unlikely to be able to eradicate the infection entirely with antibiotics. The second option would be to obtain surveillance blood cultures following the discontinuation of antibiotics to see if the infection has indeed been eradicated and the third would be to continue her on long-term suppressive antibiotic therapy. Without having seen the patient formally, Dr. Crissie Figures inclination is towards the third  supported by my concern about the morbidity of extraction in this lady whose mental functions are far better than  I had anticipated from the notes but still significantly limited.    Virl Axe

## 2016-10-12 NOTE — Progress Notes (Signed)
   Daughter would like an update. Awaiting EP recommendations and limited TTE.

## 2016-10-12 NOTE — Progress Notes (Signed)
*  PRELIMINARY RESULTS* Echocardiogram 2D Echocardiogram has been performed. A Limited Echo was requested to evaluate pacemaker lead in the right atrium.  Jill Copeland 10/12/2016, 9:39 AM

## 2016-10-12 NOTE — Progress Notes (Signed)
[facility] [place] NAME: Jill Copeland    MR#:  [id number]  DATE OF BIRTH:  08-17-1931  SUBJECTIVE: She denies any complaints. Wants to go home.   CHIEF COMPLAINT:   Chief Complaint  Patient presents with  . Abnormal Lab    sent in for positive bx and positive vegatative endocarditis    REVIEW OF SYSTEMS:   ROS CONSTITUTIONAL: No fever, fatigue or weakness.  EYES: No blurred or double vision.  EARS, NOSE, AND THROAT: No tinnitus or ear pain.  RESPIRATORY: No cough, shortness of breath, wheezing or hemoptysis.  CARDIOVASCULAR: No chest pain, orthopnea, edema.  GASTROINTESTINAL: No nausea, vomiting, diarrhea or abdominal pain.  GENITOURINARY: No dysuria, hematuria.  ENDOCRINE: No polyuria, nocturia,  HEMATOLOGY: No anemia, easy bruising or bleeding SKIN: No rash or lesion. MUSCULOSKELETAL: No joint pain or arthritis.   NEUROLOGIC: No tingling, numbness, weakness.  PSYCHIATRY: No anxiety or depression.   DRUG ALLERGIES:     VITALS:  Blood pressure (!) 145/58, pulse 66, temperature 99.3 F (37.4 C), temperature source Oral, resp. rate 18, height 5\' 3"  (1.6 m), weight 54.9 kg (121 lb), SpO2 96 %.  PHYSICAL EXAMINATION:  GENERAL:  82 y.o.-year-old patient lying in the bed with no acute distress.  EYES: Pupils equal, round, reactive to light and accommodation. No scleral icterus. Extraocular muscles intact.  HEENT: Head atraumatic, normocephalic. Oropharynx and nasopharynx clear.  NECK:  Supple, no jugular venous distention. No thyroid enlargement, no tenderness.  LUNGS: Normal breath sounds bilaterally, no wheezing, rales,rhonchi or crepitation. No use of accessory muscles of respiration.  CARDIOVASCULAR: S1, S2 normal. No murmurs, rubs, or gallops.  ABDOMEN: Soft, nontender, nondistended. Bowel sounds present. No organomegaly or mass.  EXTREMITIES: No pedal edema, cyanosis, or clubbing.  NEUROLOGIC: Cranial nerves II  through XII are intact. Muscle strength 5/5 in all extremities. Sensation intact. Gait not checked.  PSYCHIATRIC: The patient is alert and oriented x 3.  SKIN: No obvious rash, lesion, or ulcer.    LABORATORY PANEL:   CBC  Recent Labs Lab 10/10/16 0706  WBC 10.9  HGB 13.5  HCT 39.8  PLT 126*   ------------------------------------------------------------------------------------------------------------------  Chemistries   Recent Labs Lab 10/09/16 1435 10/11/16 1303  10/12/16 0652  NA 139 133*  --   --   K 3.4* 4.0  --   --   CL 104 98*  --   --   CO2 28 25  --   --   GLUCOSE 115* 150*  --   --   BUN 25* 19  --   --   CREATININE 0.66 0.65  < > 0.87  CALCIUM 8.2* 8.7*  --   --   AST 22  --   --   --   ALT 14  --   --   --   ALKPHOS 101  --   --   --   BILITOT 0.4  --   --   --   < > = values in this interval not displayed. ------------------------------------------------------------------------------------------------------------------  Cardiac Enzymes No results for input(s): TROPONINI in the last 168 hours. ------------------------------------------------------------------------------------------------------------------  RADIOLOGY:  Ct Chest W Contrast  Result Date: 10/11/2016 CLINICAL DATA:  Recent discharge from hospital for sepsis, discharged with antibiotics, returns today because of possible vegetations by echocardiogram and right ventricle. Unsuccessful TEE today due to stricture. EXAM: CT CHEST WITH CONTRAST TECHNIQUE: Multidetector CT imaging of the chest was performed during intravenous contrast administration. CONTRAST:  23mL ISOVUE-300 IOPAMIDOL (ISOVUE-300) INJECTION 61% COMPARISON:  Chest CT dated 12/30/2014. FINDINGS: Cardiovascular: Heart size is normal. No pericardial effusion. No aortic aneurysm. Aortic atherosclerosis. Mediastinum/Nodes: Moderate mediastinal and supraclavicular lymphadenopathy is not significantly changed in the interval suggesting  benignity. Hiatal hernia, moderate to large, appears stable. Surgical anastomosis above the level of the hiatal hernia. Thickening of the esophageal walls above the level of the surgical change. Lungs/Pleura: Again noted are patchy ill-defined and nodular consolidations throughout both lungs, again with a predominantly peripheral distribution, largest components not significantly changed compared to the previous exam, worsened in the right upper lobe. Upper Abdomen: No acute findings. Musculoskeletal: No acute or suspicious osseous findings. IMPRESSION: 1. Hiatal hernia, moderate to large in size, stable. Surgical anastomosis again appreciated above the level of the hiatal hernia, presumably at the site of the stricture described at today's TEE attempt. Walls of the esophagus above the surgical level are circumferentially thickened suggesting chronic reflux, less likely infectious or neoplastic. 2. The moderate mediastinal lymphadenopathy is not significantly changed compared to the CT chest of 12/30/2014 suggesting benignity. 3. Patchy consolidations throughout both lungs, most of which is not significantly changed compared to the previous chest CT of 12/30/2014, suggesting chronic versus recurrent infectious or inflammatory process. Worsened opacities are seen within the right upper lobe, presumably pneumonia given the history of recent sepsis. 4. Heart size is normal.  No pericardial effusion. 5. Aortic atherosclerosis. Aortic Atherosclerosis (ICD10-I70.0). Electronically Signed   By: Franki Cabot M.D.   On: 10/11/2016 14:38    EKG:   Orders placed or performed during the hospital encounter of 10/02/16  . EKG 12-Lead  . EKG 12-Lead    ASSESSMENT AND PLAN:   Staph hominis bacteremia with possible vegetations in aortic valve: Seen by Dr. Virl Axe recommended prolonged course of IV antibiotics with cefazolin to 3 g every 8 hours, rifampin 300 mg every 8 hours. Of note allergic to pcn, that patient  tolerated ceftin . Patient is PICC line for at least 4-6 weeks of antibiotics.  #2. The thyroid isn't: Continue Synthroid #3. Alzheimer's dementia: Continue outpatient medicines. Discussed with Dr. Virl Axe who is going to discuss with patient's daughter about plan of care.  All the records are reviewed and case discussed with Care Management/Social Workerr. Management plans discussed with the patient, family and they are in agreement.  CODE STATUS: full  TOTAL TIME TAKING CARE OF THIS PATIENT: 30minutes.   POSSIBLE D/C IN 1-2DAYS, DEPENDING ON CLINICAL CONDITION.   [name] M.D on 10/12/2016 at 2:47 PM  Between 7am to 6pm - Pager - [phone]  After 6pm go to [email or web address] - password [email or web address] [facility]  [facility]  Office  [phone]  CC: Primary care physician; Marco Collie, MD   Note: This dictation was prepared with Dragon dictation along with smaller phrase technology. Any transcriptional errors that result from this process are unintentional.

## 2016-10-13 ENCOUNTER — Encounter: Payer: Self-pay | Admitting: Internal Medicine

## 2016-10-13 LAB — ECHOCARDIOGRAM COMPLETE
Height: 63 in
Weight: 1945.6 oz

## 2016-10-13 MED ORDER — TEMAZEPAM 7.5 MG PO CAPS: 7.5000 mg | ORAL_CAPSULE | Freq: Every evening | ORAL | Status: DC | PRN

## 2016-10-13 MED ORDER — SODIUM CHLORIDE 0.9% FLUSH: 10.0000 mL | INTRAVENOUS | Status: DC | PRN

## 2016-10-13 MED ORDER — RIFAMPIN 300 MG PO CAPS: 300.0000 mg | ORAL_CAPSULE | Freq: Three times a day (TID) | ORAL | 0 refills | Status: DC

## 2016-10-13 MED ORDER — SODIUM CHLORIDE 0.9% FLUSH
10.0000 mL | Freq: Two times a day (BID) | INTRAVENOUS | Status: DC
Start: 2016-10-13 — End: 2016-10-14
  Administered 2016-10-13 – 2016-10-14 (×3): 10 mL

## 2016-10-13 MED ORDER — TRAMADOL HCL 50 MG PO TABS: 50.0000 mg | ORAL_TABLET | Freq: Two times a day (BID) | ORAL | 0 refills | Status: DC | PRN

## 2016-10-13 MED ORDER — DEXTROSE 5 % IV SOLN: 3.0000 g | Freq: Three times a day (TID) | INTRAVENOUS | 0 refills | Status: AC

## 2016-10-13 NOTE — Evaluation (Signed)
Physical Therapy Evaluation Patient Details Name: Jill Copeland MRN: [id number] DOB: 1931/11/03 Today's Date: 10/13/2016   History of Present Illness  Patient presents with concern over vegetation on pacemaker, fear of endocarditis. She was recently discharged after admission for multi-focal pneumonia. She has a history of bipolar disorder and schizophrenia and has been living in an ALF.   Clinical Impression  Patient is a very pleasant 83 y/o female that presents with concerns of endocarditis. She was recently discharged and had been fairly independent with a RW, she presents as much the same physically this date. She requires no physical assistance for transfers and is able to ambulate short distances with no AD, though she is certainly at much lower falls risk with RW. Her HR throughout ambulation is between 80-100 bpm with no complaints provided. She appears to be near her physical baseline and is able to physically manage in her home environment, though would likely benefit from HHPT.    Follow Up Recommendations Home health PT;Supervision for mobility/OOB;Other (comment) (Has been in an ALF)    Equipment Recommendations  Rolling walker with 5" wheels    Recommendations for Other Services       Precautions / Restrictions Precautions Precautions: Fall;ICD/Pacemaker Restrictions Weight Bearing Restrictions: No      Mobility  Bed Mobility               General bed mobility comments: Not tested, patient is in bedside.   Transfers Overall transfer level: Needs assistance Equipment used: Rolling walker (2 wheeled) Transfers: Sit to/from Stand Sit to Stand: Supervision         General transfer comment: She is able to perform sit to stand transfer with use of UEs and no loss of balance.   Ambulation/Gait Ambulation/Gait assistance: Min guard Ambulation Distance (Feet): 200 Feet Assistive device: Rolling walker (2 wheeled)   Gait velocity: decreased gait  velocity   General Gait Details: Patient ambulates in and out of bathroom without AD and no loss of balance though very short step lengths noted indicative of falls risk. With RW she is able to increase stride length and gait speed with less lateral sway.   Stairs            Wheelchair Mobility    Modified Rankin (Stroke Patients Only)       Balance Overall balance assessment: Needs assistance Sitting-balance support: No upper extremity supported;Feet supported Sitting balance-Leahy Scale: Good Sitting balance - Comments: Patient is able to sit independently with no loss of balance.    Standing balance support: Bilateral upper extremity supported Standing balance-Leahy Scale: Good                               Pertinent Vitals/Pain Pain Assessment: No/denies pain    Home Living Family/patient expects to be discharged to:: Assisted living               Home Equipment: Walker - 4 wheels;Grab bars - toilet;Grab bars - tub/shower      Prior Function Level of Independence: Needs assistance   Gait / Transfers Assistance Needed: pt reports ambulating to/from the dining hall with 4-wheel walker without supervision, history of falls  ADL's / Homemaking Assistance Needed: pt reports assistance with showering and dressing only        Hand Dominance        Extremity/Trunk Assessment   Upper Extremity Assessment Upper Extremity Assessment: Overall WFL for tasks assessed  Lower Extremity Assessment Lower Extremity Assessment: Overall WFL for tasks assessed    Cervical / Trunk Assessment Cervical / Trunk Assessment: Kyphotic  Communication   Communication: No difficulties  Cognition Arousal/Alertness: Awake/alert Behavior During Therapy: WFL for tasks assessed/performed Overall Cognitive Status: History of cognitive impairments - at baseline                                 General Comments: pt oriented to person, place, and  situation -- understands the team is looking for placement for assistance with her anti-biotics.       General Comments      Exercises     Assessment/Plan    PT Assessment Patient needs continued PT services  PT Problem List Decreased strength;Decreased activity tolerance;Decreased balance;Decreased mobility;Decreased coordination;Decreased cognition;Decreased knowledge of use of DME;Decreased safety awareness;Cardiopulmonary status limiting activity       PT Treatment Interventions DME instruction;Gait training;Functional mobility training;Therapeutic activities;Therapeutic exercise;Balance training;Patient/family education;Stair training;Neuromuscular re-education    PT Goals (Current goals can be found in the Care Plan section)  Acute Rehab PT Goals Patient Stated Goal: To be able to find a place to go after this discharge.  PT Goal Formulation: With patient Time For Goal Achievement: 10/27/16 Potential to Achieve Goals: Good    Frequency Min 2X/week   Barriers to discharge        Co-evaluation               AM-PAC PT "6 Clicks" Daily Activity  Outcome Measure Difficulty turning over in bed (including adjusting bedclothes, sheets and blankets)?: A Little Difficulty moving from lying on back to sitting on the side of the bed? : A Little Difficulty sitting down on and standing up from a chair with arms (e.g., wheelchair, bedside commode, etc,.)?: None Help needed moving to and from a bed to chair (including a wheelchair)?: None Help needed walking in hospital room?: None Help needed climbing 3-5 steps with a railing? : A Little 6 Click Score: 21    End of Session Equipment Utilized During Treatment: Gait belt Activity Tolerance: Patient tolerated treatment well Patient left: in chair;with chair alarm set;with call bell/phone within reach Nurse Communication: Mobility status PT Visit Diagnosis: Unsteadiness on feet (R26.81);Other abnormalities of gait and  mobility (R26.89);Muscle weakness (generalized) (M62.81);History of falling (Z91.81)    Time: 5176-1607 PT Time Calculation (min) (ACUTE ONLY): 11 min   Charges:   PT Evaluation $PT Eval Moderate Complexity: 1 Procedure     PT G Codes:   PT G-Codes **NOT FOR INPATIENT CLASS** Functional Assessment Tool Used: AM-PAC 6 Clicks Basic Mobility Functional Limitation: Mobility: Walking and moving around Mobility: Walking and Moving Around Current Status (P7106): At least 1 percent but less than 20 percent impaired, limited or restricted Mobility: Walking and Moving Around Goal Status [phone]): At least 1 percent but less than 20 percent impaired, limited or restricted   Royce Macadamia PT, DPT, CSCS     10/13/2016, 4:28 PM

## 2016-10-13 NOTE — Discharge Summary (Addendum)
[facility] NAME: Jill Copeland    MR#:  [id number]  DATE OF BIRTH:  21-Dec-1931  DATE OF ADMISSION:  10/09/2016 ADMITTING PHYSICIAN: Bettey Costa, MD  DATE OF DISCHARGE: 10/14/2016  PRIMARY CARE PHYSICIAN: Marco Collie, MD   ADMISSION DIAGNOSIS:  Infective endocarditis, due to unspecified organism, unspecified chronicity [I33.0]  DISCHARGE DIAGNOSIS:  Principal Problem:   Pacemaker infection ([place]) Active Problems:   Schizoaffective disorder, unspecified type ([place])   Dementia in chronic schizophrenia ([place])   Alzheimer disease   Recurrent pneumonia   Endocarditis   SECONDARY DIAGNOSIS:   Past Medical History:  Diagnosis Date  . Alzheimer disease   . Arrhythmia    with syncope s/p PPM  . Arthritis   . Bacteremia    several timesf  . Diabetes mellitus without complication ([place])   . Dysphagia causing pulmonary aspiration with swallowing   . Esophageal stricture   . GERD (gastroesophageal reflux disease)   . Hiatal hernia    s/p repair with fistula  . Hypothyroidism   . Pneumococcal pneumonia ([place])   . Recurrent pneumonia   . Recurrent UTI   . Schizo affective schizophrenia ([place])   . Stroke (cerebrum) ([place])   . Symptomatic bradycardia    a. s/p biotronik PPM 02/2005 at [facility] in [place], [place]     ADMITTING HISTORY  HISTORY OF PRESENT ILLNESS:  Jill Copeland  is a 83 y.o. female with a known history of Recent admission for acute respiratory failure due to bibasilar pneumonia and staph hominis sepsis he was discharged on March 17 and presents today due to follow-up echocardiogram showing possible endocarditis. Patient was contacted this morning due to abnormal echocardiogram with possible endocarditis.  HOSPITAL COURSE:   #1. 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 Saunders Revel. Typo in Echo. Should have been 'Not a definite vegetation' but printed as Definite  vegetation. Seen by cardiology and EP in hospital. Due to bacteremia and concern for endocarditis with pacer leads plan is to treat with IV cefazolin and Rifampin for 4 weeks. Will treat for another 25 days. Repeat Blood cx negative PICC line placed 7/25/208  F/U with Cariology and ID in 1 week  #2. Hypthyroidismt: Continue Synthroid  #3. Alzheimer's dementia: Continue outpatient meds  Stable for discharge to SNF with PICC line.  CONSULTS OBTAINED:  Treatment Team:  Minna Merritts, MD  DRUG ALLERGIES:   Penicillin  DISCHARGE MEDICATIONS:   Current Discharge Medication List    START taking these medications   Details  ceFAZolin 3 g in dextrose 5 % 50 mL Inject 3 g into the vein every 8 (eight) hours. Qty: 225 g, Refills: 0    rifampin (RIFADIN) 300 MG capsule Take 1 capsule (300 mg total) by mouth every 8 (eight) hours. Qty: 75 capsule, Refills: 0      CONTINUE these medications which have CHANGED   Details  traMADol (ULTRAM) 50 MG tablet Take 1 tablet (50 mg total) by mouth 2 (two) times daily as needed for moderate pain. Qty: 10 tablet, Refills: 0      CONTINUE these medications which have NOT CHANGED   Details  acetaminophen (TYLENOL) 500 MG tablet Take 500 mg by mouth every 4 (four) hours as needed.    alum & mag hydroxide-simeth (MINTOX) 200-200-20 MG/5ML suspension Take 30 mLs by mouth as needed for indigestion or heartburn.    azithromycin (ZITHROMAX) 250 MG tablet Take 1 tablet (250  mg total) by mouth daily. Qty: 4 tablet, Refills: 0    cetirizine (ZYRTEC) 10 MG tablet Take 10 mg by mouth daily.    Cholecalciferol 2000 units TABS Take 2,000 Units by mouth daily.    divalproex (DEPAKOTE ER) 500 MG 24 hr tablet Take 500 mg by mouth at bedtime.    docusate sodium (COLACE) 100 MG capsule Take 100 mg by mouth 2 (two) times daily.    feeding supplement, ENSURE ENLIVE, (ENSURE ENLIVE) LIQD Take 237 mLs by mouth 2 (two) times daily between meals. Qty: 237  mL, Refills: 12    ferrous sulfate 325 (65 FE) MG tablet Take 325 mg by mouth daily with breakfast.    fluticasone (FLONASE) 50 MCG/ACT nasal spray Place 2 sprays into both nostrils daily.    furosemide (LASIX) 20 MG tablet Take 20 mg by mouth every other day.    gabapentin (NEURONTIN) 100 MG capsule Take 200 mg by mouth 3 (three) times daily.    guaifenesin (ROBITUSSIN) 100 MG/5ML syrup Take 200 mg by mouth 4 (four) times daily as needed for cough.    levothyroxine (SYNTHROID, LEVOTHROID) 125 MCG tablet Take 125 mcg by mouth daily before breakfast.     loperamide (IMODIUM) 2 MG capsule Take 2 mg by mouth as needed for diarrhea or loose stools.    magnesium hydroxide (MILK OF MAGNESIA) 400 MG/5ML suspension Take 30 mLs by mouth daily as needed for mild constipation.    Melatonin 3 MG TABS Take 3 mg by mouth at bedtime.    metoprolol tartrate (LOPRESSOR) 25 MG tablet Take 12.5 mg by mouth 2 (two) times daily.    neomycin-bacitracin-polymyxin (NEOSPORIN) ointment Apply 1 application topically as needed for wound care. apply to eye    ondansetron (ZOFRAN) 8 MG tablet Take 8 mg by mouth every 8 (eight) hours as needed for nausea or vomiting.    potassium chloride (K-DUR,KLOR-CON) 10 MEQ tablet Take 10 mEq by mouth every other day.    rivastigmine (EXELON) 4.6 mg/24hr Place 4.6 mg onto the skin daily.    saccharomyces boulardii (FLORASTOR) 250 MG capsule Take 1 capsule (250 mg total) by mouth 2 (two) times daily. Qty: 30 capsule, Refills: 0    senna (SENOKOT) 8.6 MG TABS tablet Take 1 tablet by mouth 2 (two) times a week. Mondays and Fridays    sertraline (ZOLOFT) 50 MG tablet Take 50 mg by mouth at bedtime.    sodium chloride 1 G tablet Take 1 g by mouth 3 (three) times daily.      STOP taking these medications     cefUROXime (CEFTIN) 500 MG tablet         Today   VITAL SIGNS:  Blood pressure (!) 143/65, pulse 67, temperature 98.5 F (36.9 C), resp. rate 16, height 5'  3" (1.6 m), weight 54.9 kg (121 lb), SpO2 92 %.  I/O:    Intake/Output Summary (Last 24 hours) at 10/14/16 1137 Last data filed at 10/14/16 1012  Gross per 24 hour  Intake              700 ml  Output              300 ml  Net              400 ml    PHYSICAL EXAMINATION:  Physical Exam  GENERAL:  83 y.o.-year-old patient lying in the bed with no acute distress.  LUNGS: Normal breath sounds bilaterally, no wheezing, rales,rhonchi or crepitation.  No use of accessory muscles of respiration.  CARDIOVASCULAR: S1, S2 normal. No murmurs, rubs, or gallops.  ABDOMEN: Soft, non-tender, non-distended. Bowel sounds present. No organomegaly or mass.  NEUROLOGIC: Moves all 4 extremities. PSYCHIATRIC: The patient is alert and awake. SKIN: No obvious rash, lesion, or ulcer.   DATA REVIEW:   CBC  Recent Labs Lab 10/10/16 0706  WBC 10.9  HGB 13.5  HCT 39.8  PLT 126*    Chemistries   Recent Labs Lab 10/09/16 1435 10/11/16 1303  10/12/16 0652  NA 139 133*  --   --   K 3.4* 4.0  --   --   CL 104 98*  --   --   CO2 28 25  --   --   GLUCOSE 115* 150*  --   --   BUN 25* 19  --   --   CREATININE 0.66 0.65  < > 0.87  CALCIUM 8.2* 8.7*  --   --   AST 22  --   --   --   ALT 14  --   --   --   ALKPHOS 101  --   --   --   BILITOT 0.4  --   --   --   < > = values in this interval not displayed.  Cardiac Enzymes No results for input(s): TROPONINI in the last 168 hours.  Microbiology Results  Results for orders placed or performed during the hospital encounter of 10/09/16  Blood culture (routine x 2)     Status: None   Collection Time: 10/09/16  2:35 PM  Result Value Ref Range Status   Specimen Description BLOOD RIGHT ANTECUBITAL  Final   Special Requests   Final    BOTTLES DRAWN AEROBIC AND ANAEROBIC Blood Culture adequate volume   Culture NO GROWTH 5 DAYS  Final   Report Status 10/14/2016 FINAL  Final  Blood culture (routine x 2)     Status: None   Collection Time: 10/09/16   3:05 PM  Result Value Ref Range Status   Specimen Description BLOOD BLOOD LEFT WRIST  Final   Special Requests   Final    BOTTLES DRAWN AEROBIC AND ANAEROBIC Blood Culture results may not be optimal due to an inadequate volume of blood received in culture bottles   Culture NO GROWTH 5 DAYS  Final   Report Status 10/14/2016 FINAL  Final    RADIOLOGY:  No results found.  Follow up with PCP in 1 week.  Management plans discussed with the patient, family and they are in agreement.  CODE STATUS:     Code Status Orders        Start     Ordered   10/09/16 1606  Full code  Continuous     10/09/16 1605    Code Status History    Date Active Date Inactive Code Status Order ID Comments User Context   10/02/2016  4:27 PM 10/05/2016  4:53 PM Full Code [id number]  Fritzi Mandes, MD Inpatient   01/12/2015 10:33 PM 01/15/2015 12:09 AM Full Code [id number]  Lytle Butte, MD ED   12/30/2014  4:37 PM 01/02/2015  9:00 PM DNR [id number]  Erick Colace, NP Inpatient   12/30/2014 11:53 AM 12/30/2014  4:34 PM Full Code [id number]  Janece Canterbury, MD Inpatient    Advance Directive Documentation     Most Recent Value  Type of Advance Directive  Healthcare Power of Attorney  Pre-existing out of facility DNR order (  yellow form or pink MOST form)  -  "MOST" Form in Place?  -      TOTAL TIME TAKING CARE OF THIS PATIENT ON DAY OF DISCHARGE: more than 30 minutes.   Hillary Bow R M.D on 10/14/2016 at 11:37 AM  Between 7am to 6pm - Pager - [phone]  After 6pm go to [email or web address] - password [email or web address] [facility]  [facility]  Office  [phone]  CC: Primary care physician; Marco Collie, MD  Note: This dictation was prepared with Dragon dictation along with smaller phrase technology. Any transcriptional errors that result from this process are unintentional.

## 2016-10-13 NOTE — Progress Notes (Signed)
Patient requesting medication to help her sleep tonight.  Paged Dr. Darvin Neighbours for orders.  Awaiting callback

## 2016-10-13 NOTE — NC FL2 (Signed)
[place] LEVEL OF CARE SCREENING TOOL     IDENTIFICATION  Patient Name: Jill Copeland Birthdate: 1931-09-28 Sex: female Admission Date (Current Location): 10/09/2016  [place] and [place] Number:  [name] [id number] [place] and Address:  [facility], [address] [place], [place] [address]      Provider Number: [id number]  Attending Physician Name and Address:  Hillary Bow, MD  Relative Name and Phone Number:  Colen Darling Daughter [phone] or Krystine, [place] Daughter [phone]     Current Level of Care: Hospital Recommended Level of Care: [place] Prior Approval Number:    Date Approved/Denied:   PASRR Number: pending  Discharge Plan: SNF    Current Diagnoses: Patient Active Problem List   Diagnosis Date Noted  . Pacemaker infection ([place]) 10/11/2016  . Endocarditis 10/09/2016  . Sepsis ([place]) 10/02/2016  . Schizoaffective disorder ([place])   . Pressure ulcer 01/13/2015  . Multiple falls 01/12/2015  . Overactive bladder 12/30/2014  . Diabetes mellitus without complication ([place])   . Alzheimer disease   . Schizo affective schizophrenia ([place])   . Recurrent pneumonia   . Dysphagia causing pulmonary aspiration with swallowing   . Pneumococcal pneumonia ([place])   . Arrhythmia   . Esophageal stricture   . Hypothyroidism   . Lung nodule   . Dementia in chronic schizophrenia ([place]) 08/12/2014  . Social discord 08/12/2014  . Family conflict 32/54/9826  . Schizoaffective disorder, unspecified type ([place])     Orientation RESPIRATION BLADDER Height & Weight     Self, Time, Situation, Place  Normal Continent Weight: 121 lb (54.9 kg) Height:  5\' 3"  (160 cm)  BEHAVIORAL SYMPTOMS/MOOD NEUROLOGICAL BOWEL NUTRITION STATUS      Continent Diet  AMBULATORY STATUS COMMUNICATION OF NEEDS Skin   Supervision Verbally Normal                       Personal Care Assistance Level of Assistance  Bathing, Feeding,  Dressing Bathing Assistance: Limited assistance Feeding assistance: Limited assistance Dressing Assistance: Limited assistance     Functional Limitations Info  Sight, Hearing, Speech Sight Info: Adequate Hearing Info: Adequate Speech Info: Adequate    SPECIAL CARE FACTORS FREQUENCY  PT (By licensed PT)     PT Frequency: Minimum 2x a week              Contractures Contractures Info: Present    Additional Factors Info  Code Status, Allergies    Psychotropic Code Status Info: Full Code Allergies Info: Penicilins   divalproex (DEPAKOTE ER) 24 hr tablet 500 mg sertraline (ZOLOFT) tablet 50 mg          Current Medications (10/13/2016):  This is the current hospital active medication list Current Facility-Administered Medications  Medication Dose Route Frequency Provider Last Rate Last Dose  . acetaminophen (TYLENOL) tablet 650 mg  650 mg Oral Q6H PRN Bettey Costa, MD       Or  . acetaminophen (TYLENOL) suppository 650 mg  650 mg Rectal Q6H PRN Mody, Sital, MD      . alum & mag hydroxide-simeth (MAALOX/MYLANTA) 200-200-20 MG/5ML suspension 30 mL  30 mL Oral PRN Mody, Sital, MD      . ceFAZolin (ANCEF) 3 g in dextrose 5 % 50 mL IVPB  3 g Intravenous Q8H [name], MD 130 mL/hr at 10/13/16 0537 3 g at 10/13/16 0537  . cholecalciferol (VITAMIN D) tablet 2,000 Units  2,000 Units Oral Daily Bettey Costa, MD   2,000  Units at 10/13/16 1011  . divalproex (DEPAKOTE ER) 24 hr tablet 500 mg  500 mg Oral QHS Bettey Costa, MD   500 mg at 10/12/16 2112  . docusate sodium (COLACE) capsule 100 mg  100 mg Oral BID Bettey Costa, MD   100 mg at 10/13/16 1011  . enoxaparin (LOVENOX) injection 40 mg  40 mg Subcutaneous Q24H Bettey Costa, MD   40 mg at 10/12/16 2113  . feeding supplement (ENSURE ENLIVE) (ENSURE ENLIVE) liquid 237 mL  237 mL Oral BID BM Mody, Sital, MD   237 mL at 10/13/16 1000  . ferrous sulfate tablet 325 mg  325 mg Oral Q breakfast Bettey Costa, MD   325 mg at 10/13/16  1012  . fluticasone (FLONASE) 50 MCG/ACT nasal spray 2 spray  2 spray Each Nare Daily Mody, Sital, MD   2 spray at 10/12/16 1025  . furosemide (LASIX) tablet 20 mg  20 mg Oral QODAY Mody, Sital, MD   20 mg at 10/13/16 1011  . gabapentin (NEURONTIN) capsule 200 mg  200 mg Oral TID Bettey Costa, MD   200 mg at 10/13/16 1011  . guaifenesin (ROBITUSSIN) 100 MG/5ML syrup 200 mg  200 mg Oral QID PRN Bettey Costa, MD      . HYDROcodone-acetaminophen (NORCO/VICODIN) 5-325 MG per tablet 1-2 tablet  1-2 tablet Oral Q4H PRN Mody, Sital, MD      . levothyroxine (SYNTHROID, LEVOTHROID) tablet 125 mcg  125 mcg Oral QAC breakfast Bettey Costa, MD   125 mcg at 10/12/16 1019  . loperamide (IMODIUM) capsule 2 mg  2 mg Oral PRN Bettey Costa, MD      . loratadine (CLARITIN) tablet 10 mg  10 mg Oral Daily Mody, Sital, MD   10 mg at 10/13/16 1012  . magnesium hydroxide (MILK OF MAGNESIA) suspension 30 mL  30 mL Oral Daily PRN Bettey Costa, MD      . Melatonin TABS 2.5 mg  2.5 mg Oral QHS Bettey Costa, MD   2.5 mg at 10/12/16 2112  . metoprolol tartrate (LOPRESSOR) tablet 12.5 mg  12.5 mg Oral BID Bettey Costa, MD   12.5 mg at 10/13/16 1012  . ondansetron (ZOFRAN) tablet 4 mg  4 mg Oral Q6H PRN Bettey Costa, MD       Or  . ondansetron (ZOFRAN) injection 4 mg  4 mg Intravenous Q6H PRN Mody, Sital, MD      . rifampin (RIFADIN) capsule 300 mg  300 mg Oral Q8H Deboraha Sprang, MD   300 mg at 10/13/16 0540  . rivastigmine (EXELON) 4.6 mg/24hr 4.6 mg  4.6 mg Transdermal Daily Mody, Sital, MD   4.6 mg at 10/13/16 1013  . saccharomyces boulardii (FLORASTOR) capsule 250 mg  250 mg Oral Daily Mody, Sital, MD   250 mg at 10/13/16 1013  . senna (SENOKOT) tablet 8.6 mg  1 tablet Oral Once per day on Mon Fri Mody, Ulice Bold, MD   8.6 mg at 10/11/16 1030  . sertraline (ZOLOFT) tablet 50 mg  50 mg Oral QHS Bettey Costa, MD   50 mg at 10/12/16 2112  . sodium chloride flush (NS) 0.9 % injection 10-40 mL  10-40 mL Intracatheter Q12H Sudini, Srikar,  MD   10 mL at 10/13/16 1000  . sodium chloride flush (NS) 0.9 % injection 10-40 mL  10-40 mL Intracatheter PRN Sudini, Srikar, MD      . sodium chloride tablet 1 g  1 g Oral TID Bettey Costa, MD  1 g at 10/13/16 1000     Discharge Medications: Please see discharge summary for a list of discharge medications.  Relevant Imaging Results:  Relevant Lab Results:   Additional Information SS# [id number]  Anell Barr

## 2016-10-13 NOTE — Care Management (Signed)
There is significant concern for vegetation on aortic valve and long term IV Cefazolin 3gm q 8h for approximately 4-6 weeks.  Patient is having PICC line placed today.  Have placed call to [facility] to discuss whether this can be managed in the assisted living environment.  Awaiting call back but this CM feels it is extremely doubtful the facility will be able to manage this treatment regime even with home health.

## 2016-10-13 NOTE — Clinical Social Work Note (Addendum)
CSW received referral that patient will need SNF for IV antbiotics.  CSW spoke to patient's daughter Lattie Haw [phone] who gave CSW permission to begin SNF bed search in [facility].  CSW is also awaiting PASSAR number due to patient being a Level 2 manual screen.  CSW continuing to follow patient's progress throughout discharge planning.  CSW attempted to reach patient's daughter Lattie Haw to present bed offers, waiting for call back, also attempted to contact other daughter and left message.  CSW still awaiting for PASSAR number.  Jones Broom. Norval Morton, MSW, [place]  10/13/2016 12:35 PM

## 2016-10-13 NOTE — Care Management Important Message (Signed)
Important Message  Patient Details  Name: Jill Copeland MRN: [id number] Date of Birth: Feb 04, 1932   Medicare Important Message Given:  Yes    Katrina Stack, RN 10/13/2016, 9:49 AM

## 2016-10-13 NOTE — Progress Notes (Signed)
Peripherally Inserted Central Catheter/Midline Placement  The IV Nurse has discussed with the patient and/or persons authorized to consent for the patient, the purpose of this procedure and the potential benefits and risks involved with this procedure.  The benefits include less needle sticks, lab draws from the catheter, and the patient may be discharged home with the catheter. Risks include, but not limited to, infection, bleeding, blood clot (thrombus formation), and puncture of an artery; nerve damage and irregular heartbeat and possibility to perform a PICC exchange if needed/ordered by physician.  Alternatives to this procedure were also discussed.  Bard Power PICC patient education guide, fact sheet on infection prevention and patient information card has been provided to patient /or left at bedside.    PICC/Midline Placement Documentation        Jill Copeland 10/13/2016, 9:11 AM

## 2016-10-13 NOTE — Progress Notes (Signed)
[facility] [place] NAME: Jill Copeland    MR#:  [id number]  DATE OF BIRTH:  05-11-1931    CHIEF COMPLAINT:   Chief Complaint  Patient presents with  . Abnormal Lab    sent in for positive bx and positive vegatative endocarditis   Afebrile. No CP/SOB  REVIEW OF SYSTEMS:   ROS CONSTITUTIONAL: No fever, fatigue or weakness.  EYES: No blurred or double vision.  EARS, NOSE, AND THROAT: No tinnitus or ear pain.  RESPIRATORY: No cough, shortness of breath, wheezing or hemoptysis.  CARDIOVASCULAR: No chest pain, orthopnea, edema.  GASTROINTESTINAL: No nausea, vomiting, diarrhea or abdominal pain.  GENITOURINARY: No dysuria, hematuria.  ENDOCRINE: No polyuria, nocturia,  HEMATOLOGY: No anemia, easy bruising or bleeding SKIN: No rash or lesion. MUSCULOSKELETAL: No joint pain or arthritis.   NEUROLOGIC: No tingling, numbness, weakness.  PSYCHIATRY: No anxiety or depression.   DRUG ALLERGIES:     VITALS:  Blood pressure (!) 141/67, pulse 82, temperature 98 F (36.7 C), resp. rate 16, height 5\' 3"  (1.6 m), weight 54.9 kg (121 lb), SpO2 96 %.  PHYSICAL EXAMINATION:  GENERAL:  83 y.o.-year-old patient lying in the bed with no acute distress.  EYES: Pupils equal, round, reactive to light and accommodation. No scleral icterus. Extraocular muscles intact.  HEENT: Head atraumatic, normocephalic. Oropharynx and nasopharynx clear.  NECK:  Supple, no jugular venous distention. No thyroid enlargement, no tenderness.  LUNGS: Normal breath sounds bilaterally, no wheezing, rales,rhonchi or crepitation. No use of accessory muscles of respiration.  CARDIOVASCULAR: S1, S2 normal. No murmurs, rubs, or gallops.  ABDOMEN: Soft, nontender, nondistended. Bowel sounds present. No organomegaly or mass.  EXTREMITIES: No pedal edema, cyanosis, or clubbing.  NEUROLOGIC: Cranial nerves II through XII are intact. Muscle strength 5/5 in all extremities.  Sensation intact. Gait not checked.  PSYCHIATRIC: The patient is alert and awake  SKIN: No obvious rash, lesion, or ulcer.    LABORATORY PANEL:   CBC  Recent Labs Lab 10/10/16 0706  WBC 10.9  HGB 13.5  HCT 39.8  PLT 126*   ------------------------------------------------------------------------------------------------------------------  Chemistries   Recent Labs Lab 10/09/16 1435 10/11/16 1303  10/12/16 0652  NA 139 133*  --   --   K 3.4* 4.0  --   --   CL 104 98*  --   --   CO2 28 25  --   --   GLUCOSE 115* 150*  --   --   BUN 25* 19  --   --   CREATININE 0.66 0.65  < > 0.87  CALCIUM 8.2* 8.7*  --   --   AST 22  --   --   --   ALT 14  --   --   --   ALKPHOS 101  --   --   --   BILITOT 0.4  --   --   --   < > = values in this interval not displayed. ------------------------------------------------------------------------------------------------------------------  Cardiac Enzymes No results for input(s): TROPONINI in the last 168 hours. ------------------------------------------------------------------------------------------------------------------  RADIOLOGY:  No results found.  EKG:   Orders placed or performed during the hospital encounter of 10/02/16  . EKG 12-Lead  . EKG 12-Lead    ASSESSMENT AND PLAN:   #1. 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 Saunders Revel. Typo in Echo. Should have been 'Not a definite vegetation' but printed as Definite vegetation. Seen by cardiology and EP  in hospital. Due to bacteremia and concern for endocarditis with pacer leads plan is to treat with IV cefazolin and Rifampin for 4 weeks. Will treat for another 25 days. Repeat Blood cx negative PICC line placed 7/25/208  F/U with Cariology and ID in 1 week  #2.Hypthyroidismt: Continue Synthroid  #3. Alzheimer's dementia: Continue outpatient meds  All the records are reviewed and case discussed with Care Management/Social  Worker Management plans discussed with the patient, family and they are in agreement.  CODE STATUS: full  TOTAL TIME TAKING CARE OF THIS PATIENT: 41minutes.   POSSIBLE D/C IN 1-2DAYS, DEPENDING ON CLINICAL CONDITION.   Hillary Bow R M.D on 10/13/2016 at 6:07 PM  Between 7am to 6pm - Pager - [phone]  After 6pm go to [email or web address] - password [email or web address] [facility]  [facility]  Office  [phone]  CC: Primary care physician; Marco Collie, MD   Note: This dictation was prepared with Dragon dictation along with smaller phrase technology. Any transcriptional errors that result from this process are unintentional.

## 2016-10-13 NOTE — Progress Notes (Signed)
Patient alert and oriented, plans to have PICC line placed.  Can sign her own consent.

## 2016-10-14 ENCOUNTER — Telehealth: Payer: Self-pay | Admitting: *Deleted

## 2016-10-14 LAB — CULTURE, BLOOD (ROUTINE X 2)
Culture: NO GROWTH
Culture: NO GROWTH
Special Requests: ADEQUATE

## 2016-10-14 NOTE — Telephone Encounter (Signed)
-----   Message from Blain Pais sent at 10/14/2016 12:03 PM EDT ----- Regarding: tcm/ph 8/2 1:30 Murray Hodgkins, NP

## 2016-10-14 NOTE — Clinical Social Work Placement (Signed)
   CLINICAL SOCIAL WORK PLACEMENT  NOTE  Date:  10/14/2016  Patient Details  Name: Jill Copeland MRN: [id number] Date of Birth: [birth year]  Clinical Social Work is seeking post-discharge placement for this patient at the [place] level of care (*CSW will initial, date and re-position this form in  chart as items are completed):  Yes   Patient/family provided with [facility] Work Department's list of facilities offering this level of care within the geographic area requested by the patient (or if unable, by the patient's family).  Yes   Patient/family informed of their freedom to choose among providers that offer the needed level of care, that participate in Medicare, Medicaid or managed care program needed by the patient, have an available bed and are willing to accept the patient.  Yes   Patient/family informed of [facility]'s ownership interest in [facility] and [facility], as well as of the fact that they are under no obligation to receive care at these facilities.  PASRR submitted to EDS on 10/13/16     PASRR number received on 10/14/16     Existing PASRR number confirmed on       FL2 transmitted to all facilities in geographic area requested by pt/family on 10/13/16     FL2 transmitted to all facilities within larger geographic area on       Patient informed that his/her managed care company has contracts with or will negotiate with certain facilities, including the following:        Yes   Patient/family informed of bed offers received.  Patient [facility] bed at [facility]     Physician recommends and patient chooses bed at      Patient to be transferred to [facility] on  .  Patient to be transferred to facility by [facility]     Patient family notified on 10/14/16 of transfer.  Name of family member notified:  Daughter Lattie Haw [phone]     PHYSICIAN Please sign FL2     Additional  Comment:    _______________________________________________ Ross Ludwig, LCSWA 10/14/2016, 12:24 PM

## 2016-10-14 NOTE — Clinical Social Work Note (Addendum)
CSW spoke to patient's daughter Jill Copeland and presented bed offers.  Patient will be going to [facility] for IV antibiotics.  [facility] number has been received, [phone].  [facility] can go to [facility] today if she is medically ready for discharge and orders have been received.  Jones Broom. [place], MSW, [place]  10/14/2016 10:29 AM

## 2016-10-14 NOTE — Evaluation (Signed)
Occupational Therapy Evaluation Patient Details Name: Jill Copeland MRN: [id number] DOB: 06-Nov-1931 Today's Date: 10/14/2016    History of Present Illness Patient presents with concern over vegetation on pacemaker, fear of endocarditis. She was recently discharged after admission for multi-focal pneumonia. She has a history of bipolar disorder and schizophrenia and has been living in an ALF.    Clinical Impression   Pt seen for OT evaluation this date. Pt lives in an ALF, generally using rollator for ambulation, hx of falls, and per pt report, needs assist for bathing tasks only. Pt presents at supervision to min guard level for dressing, bed mobility, and functional mobility with RW requiring verbal cues for hand placement/safe use of RW. Pt appears close to baseline in terms of self care tasks, would benefit from supervision for mobility. No anticipated OT needs upon discharge.     Follow Up Recommendations  No OT follow up;Supervision - Intermittent (supervision for all mobility)    Equipment Recommendations  None recommended by OT    Recommendations for Other Services       Precautions / Restrictions Precautions Precautions: Fall;ICD/Pacemaker Restrictions Weight Bearing Restrictions: No      Mobility Bed Mobility Overal bed mobility: Modified Independent Bed Mobility: Supine to Sit           General bed mobility comments: no LOB  Transfers Overall transfer level: Needs assistance Equipment used: Rolling walker (2 wheeled) Transfers: Sit to/from Stand Sit to Stand: Supervision         General transfer comment: VC for hand placement, no LOB noted    Balance Overall balance assessment: Needs assistance Sitting-balance support: No upper extremity supported;Feet supported Sitting balance-Leahy Scale: Good     Standing balance support: Bilateral upper extremity supported Standing balance-Leahy Scale: Good                             ADL  either performed or assessed with clinical judgement   ADL Overall ADL's : Needs assistance/impaired                                       General ADL Comments: pt generally supervision to min guard level for functional mobility, supervision and set up level for bathing, dressing tasks seated EOB, VC for hand placement with RW and to keep with her as she tends to push to the side when approaching the chair to sit     Vision Baseline Vision/History: Wears glasses Wears Glasses: Reading only Patient Visual Report: No change from baseline Vision Assessment?: No apparent visual deficits     Perception     Praxis      Pertinent Vitals/Pain Pain Assessment: No/denies pain     Hand Dominance     Extremity/Trunk Assessment Upper Extremity Assessment Upper Extremity Assessment: Overall WFL for tasks assessed   Lower Extremity Assessment Lower Extremity Assessment: Overall WFL for tasks assessed   Cervical / Trunk Assessment Cervical / Trunk Assessment: Kyphotic   Communication Communication Communication: No difficulties   Cognition Arousal/Alertness: Awake/alert Behavior During Therapy: WFL for tasks assessed/performed Overall Cognitive Status: History of cognitive impairments - at baseline                                 General Comments: pt oriented to person, place, and  situation    General Comments       Exercises     Shoulder Instructions      Home Living Family/patient expects to be discharged to:: Assisted living                             Home Equipment: Walker - 4 wheels;Grab bars - toilet;Grab bars - tub/shower          Prior Functioning/Environment Level of Independence: Needs assistance  Gait / Transfers Assistance Needed: pt reports ambulating to/from the dining hall with 4-wheel walker without supervision, history of falls per chart review (pt denies falls, no family present to confirm) ADL's / Homemaking  Assistance Needed: pt reports assistance with showering only (PT evaluation indicates assist for dressing, too; no family present to confirm) Communication / Swallowing Assistance Needed: history of aspiration          OT Problem List: Decreased safety awareness;Impaired balance (sitting and/or standing)      OT Treatment/Interventions: Self-care/ADL training;Therapeutic exercise;Therapeutic activities;Patient/family education    OT Goals(Current goals can be found in the care plan section) Acute Rehab OT Goals Patient Stated Goal: To be able to find a place to go after this discharge.  OT Goal Formulation: With patient Time For Goal Achievement: 10/28/16 Potential to Achieve Goals: Good  OT Frequency: Min 1X/week   Barriers to D/C:            Co-evaluation              AM-PAC PT "6 Clicks" Daily Activity     Outcome Measure Help from another person eating meals?: None Help from another person taking care of personal grooming?: None Help from another person toileting, which includes using toliet, bedpan, or urinal?: A Little Help from another person bathing (including washing, rinsing, drying)?: A Little Help from another person to put on and taking off regular upper body clothing?: None Help from another person to put on and taking off regular lower body clothing?: A Little 6 Click Score: 21   End of Session Equipment Utilized During Treatment: Gait belt;Rolling walker  Activity Tolerance: Patient tolerated treatment well Patient left: in chair;with call bell/phone within Copeland;with chair alarm set  OT Visit Diagnosis: Other abnormalities of gait and mobility (R26.89);History of falling (Z91.81)                Time: 7943-2761 OT Time Calculation (min): 30 min Charges:  OT General Charges $OT Visit: 1 Procedure OT Evaluation $OT Eval Low Complexity: 1 Procedure OT Treatments $Self Care/Home Management : 8-22 mins G-Codes:     Jeni Salles, MPH, MS,  OTR/L ascom [phone] 10/14/16, 10:58 AM

## 2016-10-14 NOTE — Clinical Social Work Note (Signed)
Patient to be d/c'ed today to [facility].  Patient and family agreeable to plans will transport via ems RN to call report to room [address] [phone]  Evette Cristal, MSW, [place]

## 2016-10-14 NOTE — Telephone Encounter (Signed)
Currently admitted.

## 2016-10-15 NOTE — Telephone Encounter (Signed)
Patient contacted regarding discharge from [facility] on 10/14/16.  Nurse at [facility] understands that patient is to follow up with provider Ignacia Bayley NP on 10/21/16 at 1:30 PM at [facility].  Erasmo Downer RN at [facility] confirmed appointment information and had no further questions at this time. Reviewed with her to have patient bring in medications or list at that appointment and she verbalized understanding.

## 2016-10-21 ENCOUNTER — Ambulatory Visit (INDEPENDENT_AMBULATORY_CARE_PROVIDER_SITE_OTHER): Payer: Medicare Other | Admitting: Nurse Practitioner

## 2016-10-21 ENCOUNTER — Encounter: Payer: Self-pay | Admitting: Nurse Practitioner

## 2016-10-21 VITALS — BP 130/80 | HR 70 | Ht 64.5 in | Wt 116.0 lb

## 2016-10-21 DIAGNOSIS — I10 Essential (primary) hypertension: Secondary | ICD-10-CM | POA: Diagnosis not present

## 2016-10-21 DIAGNOSIS — R7881 Bacteremia: Secondary | ICD-10-CM

## 2016-10-21 DIAGNOSIS — Z950 Presence of cardiac pacemaker: Secondary | ICD-10-CM | POA: Diagnosis not present

## 2016-10-21 NOTE — Patient Instructions (Signed)
Medication Instructions:  Please continue your current medications  Labwork: None  Testing/Procedures: None  Follow-Up: 6 weeks with Dr. Caryl Comes  If you need a refill on your cardiac medications before your next appointment, please call your pharmacy.

## 2016-10-21 NOTE — Progress Notes (Signed)
Office Visit    Patient Name: Jill Copeland Date of Encounter: 10/21/2016  Primary Care Provider:  Marco Collie, MD Primary Cardiologist:  Jerilynn Mages. Fletcher Anon, MD / S. Caryl Comes, MD   Chief Complaint    83 y/o ? with a h/o symptomatic bradycardia s/p PPM, alzheimer's dzs, hiatal hernia s/p repair with subsequent esophageal stricture, dysphagia, and aspiration; hypothyroidism, DM, prior CVA, schizoaffective disorder, and recent admissions for sepsis and bacteremia, who presents for f/u.  Past Medical History    Past Medical History:  Diagnosis Date  . Alzheimer disease   . Arthritis   . Bacteremia    a. 09/2016 - admit w/ S hominis bacteremia w/ ? of pacer lead vegetation;  b. 09/2016 TEE:  unable to fully pass TEE probe due to esoph stricture; c. IV Abx per ID.  . Diabetes mellitus without complication ([place])   . Dysphagia causing pulmonary aspiration with swallowing   . Esophageal stricture    a. @ site of anastamosis from hiatal hernia surgery.  Marland Kitchen GERD (gastroesophageal reflux disease)   . Hiatal hernia    a. s/p prior repair with fistula;  b. 09/2016 CT chest: moderate to large hiatal hernia - stable.  . Hypothyroidism   . Pneumococcal pneumonia ([place])   . Recurrent pneumonia   . Recurrent UTI   . Schizo affective schizophrenia ([place])   . Stroke (cerebrum) ([place])   . Symptomatic bradycardia    a. s/p biotronik PPM 02/2005 at [facility] in [place], [place]  . Syncope    a. 02/2005 s/p Biotronik DC PPM @ [facility] in [place], [place].   Past Surgical History:  Procedure Laterality Date  . HERNIA REPAIR    . JEJUNOSTOMY FEEDING TUBE    . PACEMAKER INSERTION    . TEE WITHOUT CARDIOVERSION N/A 10/11/2016   Procedure: TRANSESOPHAGEAL ECHOCARDIOGRAM (TEE);  Surgeon: Wellington Hampshire, MD;  Location: [facility] ORS;  Service: Cardiovascular;  Laterality: N/A;    Allergies  Allergies  Allergen Reactions  . Penicillins Other (See Comments)        History of Present Illness      83 year old female with the above complex past medical history including symptomatically bradycardia status post dual-chamber permanent pacemaker placement 2006. Other history includes hypertension, hypothyroidism, diabetes, stroke, Alzheimer's, schizoaffective disorder, hiatal hernia status post repair with subsequent development of esophageal stricture, dysphasia, and aspiration. She was recently admitted to [facility] on July 14 with sepsis and bacteremia growing Staph hominis.  She was also treated for multifocal pneumonia. A transthoracic echocardiogram showed EF 60-65%. The aortic valve was suboptimally visualized but appeared sclerotic with the possibility of vegetation. Transthoracic echocardiogram was recommended. Patient was actually discharged home prior to the return of that echocardiogram and so when the study was finalized, she was contacted for readmission. She underwent transesophageal echocardiogram however, in the setting of esophageal stricture, our team was not able to pass the ultrasound probe and therefore imaging was limited to that of just the proximal aorta and a portion of the aortic valve. Patient was followed by infectious disease throughout the hospitalization and given history of pacemaker placement with concern for possible vegetation on pacemaker leads, a dedicated echo was performed to assess for vegetations on the right-sided leads. This could not be fully ruled out or ruled in based on imaging. As result, decision was made to keep her on antibiotic therapy with outpatient infectious disease follow-up. She was seen by Dr. Caryl Comes (EP) and was felt to be at a  high risk for complications related to pacemaker explant, if that were ever to occur.  Since discharge, patient has been staying at [facility] and says she's been doing well. She has not had any chest pain or dyspnea. She continues to receive antibiotics through her right upper extremity PICC line without  complications. She is scheduled to see infectious disease, though she isn't sure as to when.  Home Medications    Prior to Admission medications   Medication Sig Start Date End Date Taking? Authorizing Provider  acetaminophen (TYLENOL) 500 MG tablet Take 500 mg by mouth every 4 (four) hours as needed.   Yes [provider]  alum & mag hydroxide-simeth (MINTOX) 200-200-20 MG/5ML suspension Take 30 mLs by mouth as needed for indigestion or heartburn.   Yes [provider]  ceFAZolin 3 g in dextrose 5 % 50 mL Inject 3 g into the vein every 8 (eight) hours. 10/13/16 11/07/16 Yes Sudini, Alveta Heimlich, MD  cetirizine (ZYRTEC) 10 MG tablet Take 10 mg by mouth daily.   Yes [provider]  Cholecalciferol 2000 units TABS Take 2,000 Units by mouth daily.   Yes [provider]  divalproex (DEPAKOTE ER) 500 MG 24 hr tablet Take 500 mg by mouth at bedtime.   Yes [provider]  docusate sodium (COLACE) 100 MG capsule Take 100 mg by mouth 2 (two) times daily.   Yes [provider]  ferrous sulfate 325 (65 FE) MG tablet Take 325 mg by mouth daily with breakfast.   Yes [provider]  fluticasone (FLONASE) 50 MCG/ACT nasal spray Place 2 sprays into both nostrils daily.   Yes [provider]  furosemide (LASIX) 20 MG tablet Take 20 mg by mouth every other day.   Yes [provider]  gabapentin (NEURONTIN) 100 MG capsule Take 200 mg by mouth 3 (three) times daily.   Yes [provider]  guaifenesin (ROBITUSSIN) 100 MG/5ML syrup Take 200 mg by mouth 4 (four) times daily as needed for cough.   Yes [provider]  levothyroxine (SYNTHROID, LEVOTHROID) 125 MCG tablet Take 125 mcg by mouth daily before breakfast.    Yes [provider]  loperamide (IMODIUM) 2 MG capsule Take 2 mg by mouth as needed for diarrhea or loose stools.   Yes [provider]  magnesium hydroxide (MILK OF MAGNESIA) 400 MG/5ML  suspension Take 30 mLs by mouth daily as needed for mild constipation.   Yes [provider]  Melatonin 3 MG TABS Take 3 mg by mouth at bedtime.   Yes [provider]  metoprolol tartrate (LOPRESSOR) 25 MG tablet Take 12.5 mg by mouth 2 (two) times daily.   Yes [provider]  neomycin-bacitracin-polymyxin (NEOSPORIN) ointment Apply 1 application topically as needed for wound care. apply to eye   Yes [provider]  ondansetron (ZOFRAN) 8 MG tablet Take 8 mg by mouth every 8 (eight) hours as needed for nausea or vomiting.   Yes [provider]  potassium chloride (K-DUR,KLOR-CON) 10 MEQ tablet Take 10 mEq by mouth every other day.   Yes [provider]  rivastigmine (EXELON) 4.6 mg/24hr Place 4.6 mg onto the skin daily.   Yes [provider]  saccharomyces boulardii (FLORASTOR) 250 MG capsule Take 1 capsule (250 mg total) by mouth 2 (two) times daily. Patient taking differently: Take 250 mg by mouth daily.  01/01/15  Yes Hosie Poisson, MD  senna (SENOKOT) 8.6 MG TABS tablet Take 1 tablet by mouth 2 (  two) times a week. Mondays and Fridays   Yes [provider]  sertraline (ZOLOFT) 50 MG tablet Take 50 mg by mouth at bedtime.   Yes [provider]  sodium chloride 1 G tablet Take 1 g by mouth 3 (three) times daily.   Yes [provider]  traMADol (ULTRAM) 50 MG tablet Take 1 tablet (50 mg total) by mouth 2 (two) times daily as needed for moderate pain. 10/13/16  Yes Hillary Bow, MD    Review of Systems    She denies chest pain, palpitations, dyspnea, pnd, orthopnea, n, v, dizziness, syncope, edema, weight gain, or early satiety.  All other systems reviewed and are otherwise negative except as noted above.  Physical Exam    VS:  BP 130/80   Pulse 70   Ht 5' 4.5" (1.638 m)   Wt 116 lb (52.6 kg)   BMI 19.60 kg/m  , BMI Body mass index is 19.6 kg/m.  GEN: Well nourished, well developed, in no acute  distress.  HEENT: normal.  Neck: Supple, no JVD, carotid bruits, or masses. Cardiac: RRR, no murmurs, rubs, or gallops. No clubbing, cyanosis, edema.  Radials/DP/PT 2+ and equal bilaterally.  Respiratory:  Respirations regular and unlabored, clear to auscultation bilaterally. GI: Soft, nontender, nondistended, BS + x 4. MS: no deformity or atrophy. Skin: warm and dry, no rash. Neuro:  Strength and sensation are intact. Psych: Normal affect.  Accessory Clinical Findings    ECG - Regular sinus rhythm, 70, no acute ST or T changes.  Assessment & Plan    1.  S hominis bacteremia:  Patient with 2 admissions in July and the setting of bacteremia. Attempts to identify whether or not vegetation existed either on her cardiac valves or pacemaker hardware, or limited transthoracic echo imaging and an inability to successfully perform transesophageal echocardiogram in the setting of esophageal stricture. There were no obvious vegetations noted on valves. Limited echo on July 24 looking at her right-sided atrial and ventricular leads showed faint echoes adjacent to the right ventricular lead as it passed to the right atrium. It was felt that this could represent artifact but vegetation could not be ruled out. Patient was seen by electrophysiology and felt to be at a high risk for complication if lead/device extraction was required. In that setting, decision was made to continue antibiotic therapy via a PICC line in the outpatient setting with close infectious disease follow-up and surveillance blood cultures. She is scheduled to see infectious disease later this month. She has been doing well and tolerating antibiotics well.  2. Essential hypertension: Pressure initially 162/82 however on repeat after about 15 minutes, she was 130/80. I will continue beta blocker and low-dose diuretic therapy.  3. Disposition: The patient does not have electrophysiology follow-up arranged locally, I will arrange for her to  follow-up with Dr. Caryl Comes in our office in approximately 4-6 weeks.   Murray Hodgkins, NP 10/21/2016, 3:05 PM

## 2016-11-19 ENCOUNTER — Encounter: Payer: Self-pay | Admitting: Unknown Physician Specialty

## 2016-11-19 ENCOUNTER — Ambulatory Visit (INDEPENDENT_AMBULATORY_CARE_PROVIDER_SITE_OTHER): Payer: Medicare Other | Admitting: Unknown Physician Specialty

## 2016-11-19 DIAGNOSIS — Z7409 Other reduced mobility: Secondary | ICD-10-CM | POA: Diagnosis not present

## 2016-11-19 DIAGNOSIS — Z7189 Other specified counseling: Secondary | ICD-10-CM

## 2016-11-19 DIAGNOSIS — R0689 Other abnormalities of breathing: Secondary | ICD-10-CM

## 2016-11-19 DIAGNOSIS — F205 Residual schizophrenia: Secondary | ICD-10-CM | POA: Diagnosis not present

## 2016-11-19 DIAGNOSIS — F0280 Dementia in other diseases classified elsewhere without behavioral disturbance: Secondary | ICD-10-CM

## 2016-11-19 MED ORDER — GABAPENTIN 100 MG PO CAPS: 200.0000 mg | ORAL_CAPSULE | Freq: Three times a day (TID) | ORAL | 1 refills | Status: DC

## 2016-11-19 MED ORDER — DIVALPROEX SODIUM ER 500 MG PO TB24: 500.0000 mg | ORAL_TABLET | Freq: Every day | ORAL | 1 refills | Status: DC

## 2016-11-19 MED ORDER — SERTRALINE HCL 50 MG PO TABS: 50.0000 mg | ORAL_TABLET | Freq: Every day | ORAL | 1 refills | Status: DC

## 2016-11-19 NOTE — Patient Instructions (Signed)
 [facility] [phone]

## 2016-11-19 NOTE — Progress Notes (Signed)
BP 122/71   Pulse 67   Temp 99.1 F (37.3 C)   Ht 5\' 3"  (1.6 m)   Wt 114 lb 6.4 oz (51.9 kg)   SpO2 97%   BMI 20.27 kg/m    Subjective:    Patient ID: Jill Copeland, female    DOB: 25-Jul-1931, 83 y.o.   MRN: [id number]  HPI: Jill Copeland is a 83 y.o. female  Chief Complaint  Patient presents with  . Establish Care    pt states that she has been having some rib pain on the left side, states its not very painful but kind of uncomfortable    Jill Copeland is arrives here to establish care.  Her history is remarkable for a history of schitzophrenia complicated by Alzheimer's dementia.  She was hospitalized twice this past summer.  Once for endocarditis due to infected pacemaker and most recently for pneumonia.   She had been in a nursing home in the past but now living with daughter.  Daughter needs help getting resources.    Schitzophrenia Pt's daughter feels this is a wrong diagnosis and feel she is autistic and has been screened positive.    Infection management Through Dr. Ola Spurr upon review of chart.  Blood cultures are pending September 5.  Cephalexen completed and may restart antibiotics  Dementia  Active diagnosis. Daughter states sometimes she forgets and sometimes doesn't.  ADL Pt needs assistance with bathing, meals.  Lacks ability to walk upstairs.  Unable to cook.  Walks with a cane     Rib pain States ribs feel funny on the left.  She does have chronic opacities on chest CT and x-ray   Social History   Social History  . Marital status: Widowed    Spouse name: N/A  . Number of children: N/A  . Years of education: N/A   Occupational History  . Not on file.   Social History Main Topics  . Smoking status: Never Smoker  . Smokeless tobacco: Never Used  . Alcohol use No  . Drug use: No  . Sexual activity: Not on file   Other Topics Concern  . Not on file   Social History Narrative  . No narrative on file   Family History  Problem  Relation Age of Onset  . Thyroid cancer Grandchild   . Thyroid nodules Daughter   . Thyroid cancer Daughter   . Graves' disease Daughter   . Dementia Mother   . Schizophrenia Maternal Aunt   . Thyroid disease Son   . Cancer Son   . Cancer Brother   . Cancer Brother    Past Medical History:  Diagnosis Date  . Alzheimer disease   . Arthritis   . Bacteremia    a. 09/2016 - admit w/ S hominis bacteremia w/ ? of pacer lead vegetation;  b. 09/2016 TEE:  unable to fully pass TEE probe due to esoph stricture; c. IV Abx per ID.  . Diabetes mellitus without complication ([place])   . Dysphagia causing pulmonary aspiration with swallowing   . Esophageal stricture    a. @ site of anastamosis from hiatal hernia surgery.  Marland Kitchen GERD (gastroesophageal reflux disease)   . Hiatal hernia    a. s/p prior repair with fistula;  b. 09/2016 CT chest: moderate to large hiatal hernia - stable.  . Hypothyroidism   . Pneumococcal pneumonia ([place])   . Recurrent pneumonia   . Recurrent UTI   . Schizo affective schizophrenia ([place])   .  Stroke (cerebrum) ([place])   . Symptomatic bradycardia    a. s/p biotronik PPM 02/2005 at [facility] in [place], [place]  . Syncope    a. 02/2005 s/p Biotronik DC PPM @ [facility] in [place], [place].   Past Surgical History:  Procedure Laterality Date  . HERNIA REPAIR     hiatal  . JEJUNOSTOMY FEEDING TUBE    . PACEMAKER INSERTION    . TEE WITHOUT CARDIOVERSION N/A 10/11/2016   Procedure: TRANSESOPHAGEAL ECHOCARDIOGRAM (TEE);  Surgeon: Wellington Hampshire, MD;  Location: [facility] ORS;  Service: Cardiovascular;  Laterality: N/A;     Relevant past medical, surgical, family and social history reviewed and updated as indicated. Interim medical history since our last visit reviewed. Allergies and medications reviewed and updated.  Review of Systems  Per HPI unless specifically indicated above     Objective:    BP 122/71   Pulse 67   Temp 99.1 F (37.3 C)   Ht 5\' 3"  (1.6 m)    Wt 114 lb 6.4 oz (51.9 kg)   SpO2 97%   BMI 20.27 kg/m   Wt Readings from Last 3 Encounters:  11/19/16 114 lb 6.4 oz (51.9 kg)  10/21/16 116 lb (52.6 kg)  10/09/16 121 lb (54.9 kg)    Physical Exam  Constitutional: She appears well-developed and well-nourished. No distress.  HENT:  Head: Normocephalic and atraumatic.  Eyes: Conjunctivae and lids are normal. Right eye exhibits no discharge. Left eye exhibits no discharge. No scleral icterus.  Neck: Normal range of motion. Neck supple. No JVD present. Carotid bruit is not present.  Cardiovascular: Normal rate, regular rhythm and normal heart sounds.   Pulmonary/Chest: Effort normal. She has rhonchi in the left lower field.  Abdominal: Normal appearance. There is no splenomegaly or hepatomegaly.  Musculoskeletal: Normal range of motion.  Neurological: She is alert.  Skin: Skin is warm, dry and intact. No rash noted. No pallor.  Psychiatric: She has a normal mood and affect. Thought content normal.    Results for orders placed or performed during the hospital encounter of 10/09/16  Blood culture (routine x 2)  Result Value Ref Range   Specimen Description BLOOD RIGHT ANTECUBITAL    Special Requests      BOTTLES DRAWN AEROBIC AND ANAEROBIC Blood Culture adequate volume   Culture NO GROWTH 5 DAYS    Report Status 10/14/2016 FINAL   Blood culture (routine x 2)  Result Value Ref Range   Specimen Description BLOOD BLOOD LEFT WRIST    Special Requests      BOTTLES DRAWN AEROBIC AND ANAEROBIC Blood Culture results may not be optimal due to an inadequate volume of blood received in culture bottles   Culture NO GROWTH 5 DAYS    Report Status 10/14/2016 FINAL   CBC  Result Value Ref Range   WBC 14.3 (H) 3.6 - 11.0 K/uL   RBC 4.34 3.80 - 5.20 MIL/uL   Hemoglobin 13.2 12.0 - 16.0 g/dL   HCT 38.2 35.0 - 47.0 %   MCV 88.0 80.0 - 100.0 fL   MCH 30.3 26.0 - 34.0 pg   MCHC 34.5 32.0 - 36.0 g/dL   RDW 14.2 11.5 - 14.5 %   Platelets 135  (L) 150 - 440 K/uL  Comprehensive metabolic panel  Result Value Ref Range   Sodium 139 135 - 145 mmol/L   Potassium 3.4 (L) 3.5 - 5.1 mmol/L   Chloride 104 101 - 111 mmol/L   CO2 28 22 - 32  mmol/L   Glucose, Bld 115 (H) 65 - 99 mg/dL   BUN 25 (H) 6 - 20 mg/dL   Creatinine, Ser 0.66 0.44 - 1.00 mg/dL   Calcium 8.2 (L) 8.9 - 10.3 mg/dL   Total Protein 6.8 6.5 - 8.1 g/dL   Albumin 2.7 (L) 3.5 - 5.0 g/dL   AST 22 15 - 41 U/L   ALT 14 14 - 54 U/L   Alkaline Phosphatase 101 38 - 126 U/L   Total Bilirubin 0.4 0.3 - 1.2 mg/dL   GFR calc non Af Amer >60 >60 mL/min   GFR calc Af Amer >60 >60 mL/min   Anion gap 7 5 - 15  CBC  Result Value Ref Range   WBC 10.9 3.6 - 11.0 K/uL   RBC 4.44 3.80 - 5.20 MIL/uL   Hemoglobin 13.5 12.0 - 16.0 g/dL   HCT 39.8 35.0 - 47.0 %   MCV 89.7 80.0 - 100.0 fL   MCH 30.5 26.0 - 34.0 pg   MCHC 34.0 32.0 - 36.0 g/dL   RDW 14.2 11.5 - 14.5 %   Platelets 126 (L) 150 - 440 K/uL  Vancomycin, trough  Result Value Ref Range   Vancomycin Tr 12 (L) 15 - 20 ug/mL  Creatinine, serum  Result Value Ref Range   Creatinine, Ser 0.91 0.44 - 1.00 mg/dL   GFR calc non Af Amer 57 (L) >60 mL/min   GFR calc Af Amer >60 >60 mL/min  Basic metabolic panel  Result Value Ref Range   Sodium 133 (L) 135 - 145 mmol/L   Potassium 4.0 3.5 - 5.1 mmol/L   Chloride 98 (L) 101 - 111 mmol/L   CO2 25 22 - 32 mmol/L   Glucose, Bld 150 (H) 65 - 99 mg/dL   BUN 19 6 - 20 mg/dL   Creatinine, Ser 0.65 0.44 - 1.00 mg/dL   Calcium 8.7 (L) 8.9 - 10.3 mg/dL   GFR calc non Af Amer >60 >60 mL/min   GFR calc Af Amer >60 >60 mL/min   Anion gap 10 5 - 15  Creatinine, serum  Result Value Ref Range   Creatinine, Ser 0.87 0.44 - 1.00 mg/dL   GFR calc non Af Amer 60 (L) >60 mL/min   GFR calc Af Amer >60 >60 mL/min  ECHOCARDIOGRAM LIMITED  Result Value Ref Range   Weight 1,936 oz   Height 63 in   BP 142/63 mmHg      Assessment & Plan:   Problem List Items Addressed This Visit       Unprioritized   Advanced care planning/counseling discussion    Pt states she would like to be resuscitated.  But, only if she has quality if life wants to maintain on life support.  Daughter Lattie Haw is her health care power of attorney and has a living will.  Asked to bring in copies for [place] to scan      Relevant Orders   Ambulatory referral to Connected Care   Adventitious breath sounds   Relevant Orders   Ambulatory referral to Cardiology   Dementia in chronic schizophrenia ([place])    This seems to be stable.  Pt able to make responses      Impaired mobility and ADLs    Will try to get support from [employer] for assistance.  Also getting PT and OT.        Relevant Orders   Ambulatory referral to Connected Care       Follow up plan:  Return in about 3 months (around 02/18/2017).

## 2016-11-19 NOTE — Assessment & Plan Note (Signed)
Pt states she would like to be resuscitated.  But, only if she has quality if life wants to maintain on life support.  Daughter Lattie Haw is her health care power of attorney and has a living will.  Asked to bring in copies for [place] to scan

## 2016-11-19 NOTE — Assessment & Plan Note (Signed)
Will try to get support from [employer] for assistance.  Also getting PT and OT.

## 2016-11-19 NOTE — Assessment & Plan Note (Signed)
This seems to be stable.  Pt able to make responses

## 2016-11-24 ENCOUNTER — Telehealth: Payer: Self-pay | Admitting: Unknown Physician Specialty

## 2016-11-24 ENCOUNTER — Encounter: Payer: Self-pay | Admitting: Unknown Physician Specialty

## 2016-11-24 NOTE — Telephone Encounter (Signed)
Spoke with pt's daughter Ms. Labar and she gave me more information about the issues she is facing with her mother. She is currently paying for everything for her mom on her own as the SS cks are still being sent to nursing home. She would need help with bathing, meals, cleaning, changing diapers. She receives $715 a month in [employer] and is on [employer] and [employer]. Will email Ms. Labar the sitter list for her to review.    From: Jill Alexanders [facility])  Sent: Monday, November 22, 2016 11:55 AM To: [email or web address] Subject: [place] Morning Ms. Legrand Como, Per our phone call today please find attached information regarding sitter services in [place] and [place], some of these agencies do accept [employer]. Ive also included a resource guide that may help you with finding other help for your mother. Please let me know if there is anything else I can assist you with.  All the best,  [email or web address]   [phone]

## 2016-12-07 ENCOUNTER — Encounter: Payer: Medicare Other | Admitting: Internal Medicine

## 2016-12-14 ENCOUNTER — Encounter: Payer: Medicare Other | Admitting: Internal Medicine

## 2016-12-22 ENCOUNTER — Encounter: Payer: Self-pay | Admitting: Emergency Medicine

## 2016-12-22 ENCOUNTER — Emergency Department
Admission: EM | Admit: 2016-12-22 | Discharge: 2016-12-24 | Disposition: A | Discharge status: Skilled Nursing Facility | Payer: Medicare Other | Attending: Emergency Medicine | Admitting: Emergency Medicine

## 2016-12-22 DIAGNOSIS — Z8673 Personal history of transient ischemic attack (TIA), and cerebral infarction without residual deficits: Secondary | ICD-10-CM | POA: Diagnosis not present

## 2016-12-22 DIAGNOSIS — E039 Hypothyroidism, unspecified: Secondary | ICD-10-CM | POA: Insufficient documentation

## 2016-12-22 DIAGNOSIS — R262 Difficulty in walking, not elsewhere classified: Secondary | ICD-10-CM | POA: Insufficient documentation

## 2016-12-22 DIAGNOSIS — F205 Residual schizophrenia: Secondary | ICD-10-CM | POA: Diagnosis present

## 2016-12-22 DIAGNOSIS — G309 Alzheimer's disease, unspecified: Secondary | ICD-10-CM | POA: Insufficient documentation

## 2016-12-22 DIAGNOSIS — F250 Schizoaffective disorder, bipolar type: Secondary | ICD-10-CM

## 2016-12-22 DIAGNOSIS — F259 Schizoaffective disorder, unspecified: Secondary | ICD-10-CM | POA: Diagnosis present

## 2016-12-22 DIAGNOSIS — E119 Type 2 diabetes mellitus without complications: Secondary | ICD-10-CM | POA: Diagnosis not present

## 2016-12-22 DIAGNOSIS — F0280 Dementia in other diseases classified elsewhere without behavioral disturbance: Secondary | ICD-10-CM | POA: Diagnosis not present

## 2016-12-22 DIAGNOSIS — Z79899 Other long term (current) drug therapy: Secondary | ICD-10-CM | POA: Diagnosis not present

## 2016-12-22 DIAGNOSIS — N3000 Acute cystitis without hematuria: Secondary | ICD-10-CM | POA: Insufficient documentation

## 2016-12-22 LAB — URINE DRUG SCREEN, QUALITATIVE (ARMC ONLY)
AMPHETAMINES, UR SCREEN: NOT DETECTED
Barbiturates, Ur Screen: NOT DETECTED
Benzodiazepine, Ur Scrn: NOT DETECTED
CANNABINOID 50 NG, UR ~~LOC~~: NOT DETECTED
COCAINE METABOLITE, UR ~~LOC~~: NOT DETECTED
MDMA (ECSTASY) UR SCREEN: NOT DETECTED
METHADONE SCREEN, URINE: NOT DETECTED
Opiate, Ur Screen: NOT DETECTED
Phencyclidine (PCP) Ur S: NOT DETECTED
TRICYCLIC, UR SCREEN: NOT DETECTED

## 2016-12-22 LAB — URINALYSIS, COMPLETE (UACMP) WITH MICROSCOPIC
BILIRUBIN URINE: NEGATIVE
Glucose, UA: NEGATIVE mg/dL
Hgb urine dipstick: NEGATIVE
KETONES UR: NEGATIVE mg/dL
NITRITE: NEGATIVE
PROTEIN: NEGATIVE mg/dL
Specific Gravity, Urine: 1.01 (ref 1.005–1.030)
pH: 5 (ref 5.0–8.0)

## 2016-12-22 LAB — GLUCOSE, CAPILLARY: GLUCOSE-CAPILLARY: 103 mg/dL — AB (ref 65–99)

## 2016-12-22 LAB — COMPREHENSIVE METABOLIC PANEL
ALT: 60 U/L — ABNORMAL HIGH (ref 14–54)
AST: 44 U/L — AB (ref 15–41)
Albumin: 4.1 g/dL (ref 3.5–5.0)
Alkaline Phosphatase: 108 U/L (ref 38–126)
Anion gap: 10 (ref 5–15)
BILIRUBIN TOTAL: 0.5 mg/dL (ref 0.3–1.2)
BUN: 18 mg/dL (ref 6–20)
CO2: 23 mmol/L (ref 22–32)
Calcium: 9.8 mg/dL (ref 8.9–10.3)
Chloride: 104 mmol/L (ref 101–111)
Creatinine, Ser: 0.84 mg/dL (ref 0.44–1.00)
GFR calc Af Amer: >60 mL/min (ref >60)
GLUCOSE: 245 mg/dL — AB (ref 65–99)
POTASSIUM: 4 mmol/L (ref 3.5–5.1)
Sodium: 137 mmol/L (ref 135–145)
TOTAL PROTEIN: 8.1 g/dL (ref 6.5–8.1)

## 2016-12-22 LAB — CBC
HCT: 43.1 % (ref 35.0–47.0)
HEMOGLOBIN: 14.9 g/dL (ref 12.0–16.0)
MCH: 30.4 pg (ref 26.0–34.0)
MCHC: 34.5 g/dL (ref 32.0–36.0)
MCV: 88 fL (ref 80.0–100.0)
Platelets: 212 10*3/uL (ref 150–440)
RBC: 4.9 MIL/uL (ref 3.80–5.20)
RDW: 14.3 % (ref 11.5–14.5)
WBC: 8.8 10*3/uL (ref 3.6–11.0)

## 2016-12-22 LAB — ETHANOL: Alcohol, Ethyl (B): <10 mg/dL (ref <10)

## 2016-12-22 MED ORDER — FLUTICASONE PROPIONATE 50 MCG/ACT NA SUSP
2.0000 | Freq: Every day | NASAL | Status: DC
Start: 2016-12-23 — End: 2016-12-24
  Administered 2016-12-23: 2 via NASAL
  Filled 2016-12-22: qty 16

## 2016-12-22 MED ORDER — FUROSEMIDE 40 MG PO TABS
20.0000 mg | ORAL_TABLET | ORAL | Status: DC
  Administered 2016-12-23: 20 mg via ORAL
  Filled 2016-12-22: qty 1

## 2016-12-22 MED ORDER — ACETAMINOPHEN 500 MG PO TABS: 500.0000 mg | ORAL_TABLET | ORAL | Status: DC | PRN

## 2016-12-22 MED ORDER — DIVALPROEX SODIUM ER 250 MG PO TB24
500.0000 mg | ORAL_TABLET | Freq: Every day | ORAL | Status: DC
  Administered 2016-12-22 – 2016-12-23 (×2): 500 mg via ORAL
  Filled 2016-12-22 (×2): qty 2

## 2016-12-22 MED ORDER — INSULIN ASPART 100 UNIT/ML ~~LOC~~ SOLN: 0.0000 [IU] | Freq: Three times a day (TID) | SUBCUTANEOUS | Status: DC

## 2016-12-22 MED ORDER — MELATONIN 3 MG PO TABS: 3.0000 mg | ORAL_TABLET | Freq: Every day | ORAL | Status: DC

## 2016-12-22 MED ORDER — LOPERAMIDE HCL 2 MG PO CAPS: 2.0000 mg | ORAL_CAPSULE | ORAL | Status: DC | PRN

## 2016-12-22 MED ORDER — RIVASTIGMINE 4.6 MG/24HR TD PT24
4.6000 mg | MEDICATED_PATCH | Freq: Every day | TRANSDERMAL | Status: DC
  Administered 2016-12-23 – 2016-12-24 (×2): 4.6 mg via TRANSDERMAL
  Filled 2016-12-22 (×2): qty 1

## 2016-12-22 MED ORDER — GABAPENTIN 100 MG PO CAPS
200.0000 mg | ORAL_CAPSULE | Freq: Three times a day (TID) | ORAL | Status: DC
  Administered 2016-12-22 – 2016-12-24 (×4): 200 mg via ORAL
  Filled 2016-12-22 (×5): qty 2

## 2016-12-22 MED ORDER — POTASSIUM CHLORIDE CRYS ER 20 MEQ PO TBCR
10.0000 meq | EXTENDED_RELEASE_TABLET | ORAL | Status: DC
  Administered 2016-12-23: 10 meq via ORAL
  Filled 2016-12-22: qty 1

## 2016-12-22 MED ORDER — LORATADINE 10 MG PO TABS
10.0000 mg | ORAL_TABLET | Freq: Every day | ORAL | Status: DC
  Administered 2016-12-23 – 2016-12-24 (×2): 10 mg via ORAL
  Filled 2016-12-22 (×2): qty 1

## 2016-12-22 MED ORDER — VITAMIN D 1000 UNITS PO TABS
2000.0000 [IU] | ORAL_TABLET | Freq: Every day | ORAL | Status: DC
  Administered 2016-12-23 – 2016-12-24 (×2): 2000 [IU] via ORAL
  Filled 2016-12-22 (×2): qty 2

## 2016-12-22 MED ORDER — FERROUS SULFATE 325 (65 FE) MG PO TABS
325.0000 mg | ORAL_TABLET | Freq: Every day | ORAL | Status: DC
  Administered 2016-12-23 – 2016-12-24 (×2): 325 mg via ORAL
  Filled 2016-12-22 (×2): qty 1

## 2016-12-22 MED ORDER — SERTRALINE HCL 50 MG PO TABS
50.0000 mg | ORAL_TABLET | Freq: Every day | ORAL | Status: DC
  Administered 2016-12-22 – 2016-12-23 (×2): 50 mg via ORAL
  Filled 2016-12-22 (×2): qty 1

## 2016-12-22 MED ORDER — FOSFOMYCIN TROMETHAMINE 3 G PO PACK
3.0000 g | PACK | Freq: Once | ORAL | Status: AC
  Administered 2016-12-22: 3 g via ORAL
  Filled 2016-12-22: qty 3

## 2016-12-22 MED ORDER — MELATONIN 5 MG PO TABS
2.5000 mg | ORAL_TABLET | Freq: Every day | ORAL | Status: DC
  Administered 2016-12-22 – 2016-12-23 (×2): 2.5 mg via ORAL
  Filled 2016-12-22 (×2): qty 0.5

## 2016-12-22 MED ORDER — DOCUSATE SODIUM 100 MG PO CAPS
100.0000 mg | ORAL_CAPSULE | Freq: Two times a day (BID) | ORAL | Status: DC
  Administered 2016-12-22 – 2016-12-23 (×3): 100 mg via ORAL
  Filled 2016-12-22 (×4): qty 1

## 2016-12-22 MED ORDER — GUAIFENESIN 100 MG/5ML PO SYRP
200.0000 mg | ORAL_SOLUTION | Freq: Four times a day (QID) | ORAL | Status: DC | PRN
  Filled 2016-12-22: qty 10

## 2016-12-22 MED ORDER — INSULIN ASPART 100 UNIT/ML ~~LOC~~ SOLN: 0.0000 [IU] | Freq: Every day | SUBCUTANEOUS | Status: DC

## 2016-12-22 MED ORDER — LEVOTHYROXINE SODIUM 50 MCG PO TABS
125.0000 ug | ORAL_TABLET | Freq: Every day | ORAL | Status: DC
  Administered 2016-12-23: 125 ug via ORAL
  Filled 2016-12-22: qty 3

## 2016-12-22 MED ORDER — ALUM & MAG HYDROXIDE-SIMETH 200-200-20 MG/5ML PO SUSP: 30.0000 mL | ORAL | Status: DC | PRN

## 2016-12-22 MED ORDER — SODIUM CHLORIDE 1 G PO TABS
1.0000 g | ORAL_TABLET | Freq: Three times a day (TID) | ORAL | Status: DC
  Administered 2016-12-22 – 2016-12-24 (×4): 1 g via ORAL
  Filled 2016-12-22 (×5): qty 1

## 2016-12-22 MED ORDER — METOPROLOL TARTRATE 25 MG PO TABS
12.5000 mg | ORAL_TABLET | Freq: Two times a day (BID) | ORAL | Status: DC
  Administered 2016-12-22 – 2016-12-24 (×4): 12.5 mg via ORAL
  Filled 2016-12-22 (×4): qty 1

## 2016-12-22 MED ORDER — SENNA 8.6 MG PO TABS
1.0000 | ORAL_TABLET | ORAL | Status: DC
  Administered 2016-12-23: 8.6 mg via ORAL
  Filled 2016-12-22: qty 1

## 2016-12-22 MED ORDER — OLANZAPINE 5 MG PO TBDP
15.0000 mg | ORAL_TABLET | Freq: Every day | ORAL | Status: DC
  Administered 2016-12-22 – 2016-12-23 (×2): 15 mg via ORAL
  Filled 2016-12-22 (×2): qty 3

## 2016-12-22 MED ORDER — MAGNESIUM HYDROXIDE 400 MG/5ML PO SUSP: 30.0000 mL | Freq: Every day | ORAL | Status: DC | PRN

## 2016-12-22 NOTE — Consult Note (Signed)
[facility] Consult   Reason for Consult:  Consult for 83 year old woman with a history of mental health problems was brought to the emergency room under IVC by her daughter Referring Physician:  Alfred Levins Patient Identification: Jill Copeland MRN:  [id number] Principal Diagnosis: Schizoaffective disorder [facility]) Diagnosis:   Patient Active Problem List   Diagnosis Date Noted  . Advanced care planning/counseling discussion [Z71.89] 11/19/2016  . Impaired mobility and ADLs [Z74.09] 11/19/2016  . Adventitious breath sounds [R06.89] 11/19/2016  . Pacemaker infection ([place]) [T82.7XXA] 10/11/2016  . Endocarditis [I38] 10/09/2016  . Schizoaffective disorder ([place]) [F25.9]   . Pressure ulcer [L89.90] 01/13/2015  . Multiple falls [R29.6] 01/12/2015  . Overactive bladder [N32.81] 12/30/2014  . Alzheimer disease [G30.9, F02.80]   . Schizo affective schizophrenia ([place]) [F25.0]   . Recurrent pneumonia [J18.9]   . Dysphagia causing pulmonary aspiration with swallowing [R13.19]   . Pneumococcal pneumonia ([place]) [J13]   . Arrhythmia [I49.9]   . Esophageal stricture [K22.2]   . Hypothyroidism [E03.9]   . Lung nodule [R91.1]   . Dementia in chronic schizophrenia ([place]) [F20.5] 08/12/2014  . Social discord [Z65.8] 08/12/2014  . Family conflict [K27.0] 62/37/6283    Total Time spent with patient: 1 hour  Subjective:   Jill Copeland is a 83 y.o. female patient admitted with "I had double pneumonia".  HPI:  Patient interviewed chart reviewed. A 18-year-old woman with an established history of schizophrenia. She was brought in under involuntary commitment papers filed by her daughter which allege, in very few words, that the patient had been violent at home. Since being in the emergency room the patient has been cooperative with no signs of violence. She tells me a rambling story about how her daughter is "mean" to her and is always telling her to "shut up". She tells me that she tries  her best to be nice to everyone at home but feels like they are unpleasant to her. She absolutely denies any physical contact or fighting at all. Admits that they got into a verbal squabble but denies making any threats. Patient says her mood is fine. Sleep is a little impaired at night. Appetite is okay. Energy level impaired. She gets tired out easily and has trouble going for a walk. Patient denies any suicidal or homicidal thoughts. She is rambling and a little bit disorganized but didn't make any bizarre or obviously delusional statements and hasn't been threatening.  Medical history: Patient has a recent history of pneumonia followed up by infective endocarditis. Was pretty sick.  Social history: Apparently since leaving the hospital she has been back staying with her daughter. This arrangement has worked out all that well in the past. Patient tells me now she would rather be living at [facility] or [facility] were one of the other assisted living facilities.  Substance abuse history: No evidence of recent substance abuse no past substance abuse history  Past Psychiatric History: Patient has a history of a diagnosis of schizophrenia. When we last saw her from the psychiatric service several years ago she was routinely on Zyprexa. Does not have a history of suicide attempts. Has a history of some agitation when psychotic and has prior hospitalizations. Also some degree of dementia although it does not seem to of progressed much  Risk to Self: Is patient at risk for suicide?: No Risk to Others:   Prior Inpatient Therapy:   Prior Outpatient Therapy:    Past Medical History:  Past Medical History:  Diagnosis  Date  . Alzheimer disease   . Arthritis   . Bacteremia    a. 09/2016 - admit w/ S hominis bacteremia w/ ? of pacer lead vegetation;  b. 09/2016 TEE:  unable to fully pass TEE probe due to esoph stricture; c. IV Abx per ID.  . Diabetes mellitus without complication ([place])   . Dysphagia  causing pulmonary aspiration with swallowing   . Esophageal stricture    a. @ site of anastamosis from hiatal hernia surgery.  Marland Kitchen GERD (gastroesophageal reflux disease)   . Hiatal hernia    a. s/p prior repair with fistula;  b. 09/2016 CT chest: moderate to large hiatal hernia - stable.  . Hypothyroidism   . Pneumococcal pneumonia ([place])   . Recurrent pneumonia   . Recurrent UTI   . Schizo affective schizophrenia ([place])   . Stroke (cerebrum) ([place])   . Symptomatic bradycardia    a. s/p biotronik PPM 02/2005 at [facility] in [place], [place]  . Syncope    a. 02/2005 s/p Biotronik DC PPM @ [facility] in [place], [place].    Past Surgical History:  Procedure Laterality Date  . HERNIA REPAIR     hiatal  . JEJUNOSTOMY FEEDING TUBE    . PACEMAKER INSERTION    . TEE WITHOUT CARDIOVERSION N/A 10/11/2016   Procedure: TRANSESOPHAGEAL ECHOCARDIOGRAM (TEE);  Surgeon: Wellington Hampshire, MD;  Location: [facility] ORS;  Service: Cardiovascular;  Laterality: N/A;   Family History:  Family History  Problem Relation Age of Onset  . Thyroid cancer Grandchild   . Thyroid nodules Daughter   . Thyroid cancer Daughter   . Graves' disease Daughter   . Dementia Mother   . Schizophrenia Maternal Aunt   . Thyroid disease Son   . Cancer Son   . Cancer Brother   . Cancer Brother    Family Psychiatric  History: No known family history Social History:  History  Alcohol Use No     History  Drug Use No    Social History   Social History  . Marital status: Widowed    Spouse name: N/A  . Number of children: N/A  . Years of education: N/A   Social History Main Topics  . Smoking status: Never Smoker  . Smokeless tobacco: Never Used  . Alcohol use No  . Drug use: No  . Sexual activity: Not Asked   Other Topics Concern  . None   Social History Narrative  . None   Additional Social History:    Allergies:   Allergies  Allergen Reactions  . Penicillins Other (See Comments)    Labs:   Results for orders placed or performed during the hospital encounter of 12/22/16 (from the past 48 hour(s))  Comprehensive metabolic panel     Status: Abnormal   Collection Time: 12/22/16  3:40 PM  Result Value Ref Range   Sodium 137 135 - 145 mmol/L   Potassium 4.0 3.5 - 5.1 mmol/L   Chloride 104 101 - 111 mmol/L   CO2 23 22 - 32 mmol/L   Glucose, Bld 245 (H) 65 - 99 mg/dL   BUN 18 6 - 20 mg/dL   Creatinine, Ser 0.84 0.44 - 1.00 mg/dL   Calcium 9.8 8.9 - 10.3 mg/dL   Total Protein 8.1 6.5 - 8.1 g/dL   Albumin 4.1 3.5 - 5.0 g/dL   AST 44 (H) 15 - 41 U/L   ALT 60 (H) 14 - 54 U/L   Alkaline Phosphatase 108 38 -  126 U/L   Total Bilirubin 0.5 0.3 - 1.2 mg/dL   GFR calc non Af Amer >60 >60 mL/min   GFR calc Af Amer >60 >60 mL/min    Comment: (NOTE) The eGFR has been calculated using the CKD EPI equation. This calculation has not been validated in all clinical situations. eGFR's persistently <60 mL/min signify possible Chronic Kidney Disease.    Anion gap 10 5 - 15  Ethanol     Status: None   Collection Time: 12/22/16  3:40 PM  Result Value Ref Range   Alcohol, Ethyl (B) <10 <10 mg/dL    Comment:        LOWEST DETECTABLE LIMIT FOR SERUM ALCOHOL IS 10 mg/dL FOR MEDICAL PURPOSES ONLY Please note change in reference range.   cbc     Status: None   Collection Time: 12/22/16  3:40 PM  Result Value Ref Range   WBC 8.8 3.6 - 11.0 K/uL   RBC 4.90 3.80 - 5.20 MIL/uL   Hemoglobin 14.9 12.0 - 16.0 g/dL   HCT 43.1 35.0 - 47.0 %   MCV 88.0 80.0 - 100.0 fL   MCH 30.4 26.0 - 34.0 pg   MCHC 34.5 32.0 - 36.0 g/dL   RDW 14.3 11.5 - 14.5 %   Platelets 212 150 - 440 K/uL    Current Facility-Administered Medications  Medication Dose Route Frequency Provider Last Rate Last Dose  . OLANZapine zydis (ZYPREXA) disintegrating tablet 15 mg  15 mg Oral QHS Clapacs, Madie Reno, MD       Current Outpatient Prescriptions  Medication Sig Dispense Refill  . acetaminophen (TYLENOL) 500 MG tablet Take  500 mg by mouth every 4 (four) hours as needed.    Marland Kitchen alum & mag hydroxide-simeth (MINTOX) 200-200-20 MG/5ML suspension Take 30 mLs by mouth as needed for indigestion or heartburn.    . cetirizine (ZYRTEC) 10 MG tablet Take 10 mg by mouth daily.    . Cholecalciferol 2000 units TABS Take 2,000 Units by mouth daily.    . divalproex (DEPAKOTE ER) 500 MG 24 hr tablet Take 1 tablet (500 mg total) by mouth at bedtime. 90 tablet 1  . docusate sodium (COLACE) 100 MG capsule Take 100 mg by mouth 2 (two) times daily.    . ferrous sulfate 325 (65 FE) MG tablet Take 325 mg by mouth daily with breakfast.    . fluticasone (FLONASE) 50 MCG/ACT nasal spray Place 2 sprays into both nostrils daily.    . furosemide (LASIX) 20 MG tablet Take 20 mg by mouth every other day.    . gabapentin (NEURONTIN) 100 MG capsule Take 2 capsules (200 mg total) by mouth 3 (three) times daily. 640 capsule 1  . guaifenesin (ROBITUSSIN) 100 MG/5ML syrup Take 200 mg by mouth 4 (four) times daily as needed for cough.    . levothyroxine (SYNTHROID, LEVOTHROID) 125 MCG tablet Take 125 mcg by mouth daily before breakfast.     . loperamide (IMODIUM) 2 MG capsule Take 2 mg by mouth as needed for diarrhea or loose stools.    . magnesium hydroxide (MILK OF MAGNESIA) 400 MG/5ML suspension Take 30 mLs by mouth daily as needed for mild constipation.    . Melatonin 3 MG TABS Take 3 mg by mouth at bedtime.    . metoprolol tartrate (LOPRESSOR) 25 MG tablet Take 12.5 mg by mouth 2 (two) times daily.    Marland Kitchen neomycin-bacitracin-polymyxin (NEOSPORIN) ointment Apply 1 application topically as needed for wound care. apply to eye    .  ondansetron (ZOFRAN) 8 MG tablet Take 8 mg by mouth every 8 (eight) hours as needed for nausea or vomiting.    . potassium chloride (K-DUR,KLOR-CON) 10 MEQ tablet Take 10 mEq by mouth every other day.    . rivastigmine (EXELON) 4.6 mg/24hr Place 4.6 mg onto the skin daily.    Marland Kitchen saccharomyces boulardii (FLORASTOR) 250 MG capsule  Take 1 capsule (250 mg total) by mouth 2 (two) times daily. (Patient taking differently: Take 250 mg by mouth daily. ) 30 capsule 0  . senna (SENOKOT) 8.6 MG TABS tablet Take 1 tablet by mouth 2 (two) times a week. Mondays and Fridays    . sertraline (ZOLOFT) 50 MG tablet Take 1 tablet (50 mg total) by mouth at bedtime. 90 tablet 1  . sodium chloride 1 G tablet Take 1 g by mouth 3 (three) times daily.    . traMADol (ULTRAM) 50 MG tablet Take 1 tablet (50 mg total) by mouth 2 (two) times daily as needed for moderate pain. (Patient not taking: Reported on 11/19/2016) 10 tablet 0    Musculoskeletal: Strength & Muscle Tone: within normal limits Gait & Station: normal Patient leans: N/A  Psychiatric Specialty Exam: Physical Exam  Nursing note and vitals reviewed. Constitutional: She appears well-developed and well-nourished.  HENT:  Head: Normocephalic and atraumatic.  Eyes: Pupils are equal, round, and reactive to light. Conjunctivae are normal.  Neck: Normal range of motion.  Cardiovascular: Normal heart sounds.   Respiratory: Effort normal.  GI: Soft.  Musculoskeletal: Normal range of motion.  Neurological: She is alert.  Skin: Skin is warm and dry.  Psychiatric: Her affect is blunt. Her speech is tangential. She is agitated. She is not aggressive. Thought content is paranoid. Cognition and memory are impaired. She expresses impulsivity. She expresses no homicidal and no suicidal ideation.    Review of Systems  Constitutional: Negative.   HENT: Negative.   Eyes: Negative.   Respiratory: Negative.   Cardiovascular: Negative.   Gastrointestinal: Negative.   Musculoskeletal: Negative.   Skin: Negative.   Neurological: Negative.   Psychiatric/Behavioral: Negative for depression, hallucinations, memory loss, substance abuse and suicidal ideas. The patient is not nervous/anxious and does not have insomnia.     Blood pressure (!) 143/63, pulse 90, temperature 98.3 F (36.8 C),  temperature source Oral, resp. rate 18, weight 52.2 kg (115 lb), SpO2 96 %.Body mass index is 20.37 kg/m.  General Appearance: Casual  Eye Contact:  Good  Speech:  Normal Rate  Volume:  Normal  Mood:  Euthymic  Affect:  Congruent  Thought Process:  Goal Directed  Orientation:  Full (Time, Place, and Person)  Thought Content:  Paranoid Ideation, Rumination and Tangential  Suicidal Thoughts:  No  Homicidal Thoughts:  No  Memory:  Immediate;   Fair Recent;   Fair Remote;   Fair  Judgement:  Impaired  Insight:  Shallow  Psychomotor Activity:  Normal  Concentration:  Concentration: Fair  Recall:  [employer] of Knowledge:  Fair  Language:  Fair  Akathisia:  No  Handed:  Right  AIMS (if indicated):     Assets:  Communication Skills Desire for Improvement Housing Resilience  ADL's:  Intact  Cognition:  Impaired,  Mild  Sleep:        Treatment Plan Summary: Medication management and Plan 83 year old woman with schizophrenia. Currently she is rambling a bed has a mildly paranoid tendency but doesn't say anything grossly bizarre. She has not been violent or threatening violence or  suicidal since being in the emergency room. Patient seems to of been brought here primarily in an attempt to find alternative placement to being at home with her family. There is currently no sign that I can find that she meets commitment criteria nor that she needs inpatient hospital treatment. Discontinued IVC. Case reviewed with emergency room physician. Restarted her Zyprexa as previously prescribed. I have put in a request to social work to see the patient for either disposition back to her daughter's house or placement if necessary.  Disposition: Patient does not meet criteria for psychiatric inpatient admission.  Alethia Berthold, MD 12/22/2016 6:20 PM

## 2016-12-22 NOTE — ED Provider Notes (Signed)
[facility] Emergency Department Provider Note  ____________________________________________  Time seen: Approximately 5:42 PM  I have reviewed the triage vital signs and the nursing notes.   HISTORY  Chief Complaint IVC   HPI Jill Copeland is a 83 y.o. female with a history of Alzheimer's disease and schizoaffective disorder who presents IVC'ed by her daughter for aggressive behavior.According to IVC papers,patient has been aggressive towards family members. Patient tells me that her daughter has been mistreating her, getting mad at her, and yelling at her. That makes patient very angry and upset. Patient wishes she could go back to [place] where she used to live. She has no other family. Patient denies depression or suicidal ideation. Patient denies homicidal ideation. Patient has no medical complaints and denies chest pain, abdominal pain, nausea or vomiting, headache, shortness of breath.  Past Medical History:  Diagnosis Date  . Alzheimer disease   . Arthritis   . Bacteremia    a. 09/2016 - admit w/ S hominis bacteremia w/ ? of pacer lead vegetation;  b. 09/2016 TEE:  unable to fully pass TEE probe due to esoph stricture; c. IV Abx per ID.  . Diabetes mellitus without complication ([place])   . Dysphagia causing pulmonary aspiration with swallowing   . Esophageal stricture    a. @ site of anastamosis from hiatal hernia surgery.  Marland Kitchen GERD (gastroesophageal reflux disease)   . Hiatal hernia    a. s/p prior repair with fistula;  b. 09/2016 CT chest: moderate to large hiatal hernia - stable.  . Hypothyroidism   . Pneumococcal pneumonia ([place])   . Recurrent pneumonia   . Recurrent UTI   . Schizo affective schizophrenia ([place])   . Stroke (cerebrum) ([place])   . Symptomatic bradycardia    a. s/p biotronik PPM 02/2005 at [facility] in [place], [place]  . Syncope    a. 02/2005 s/p Biotronik DC PPM @ [facility] in [place], [place].    Patient Active  Problem List   Diagnosis Date Noted  . Advanced care planning/counseling discussion 11/19/2016  . Impaired mobility and ADLs 11/19/2016  . Adventitious breath sounds 11/19/2016  . Pacemaker infection ([place]) 10/11/2016  . Endocarditis 10/09/2016  . Schizoaffective disorder ([place])   . Pressure ulcer 01/13/2015  . Multiple falls 01/12/2015  . Overactive bladder 12/30/2014  . Alzheimer disease   . Schizo affective schizophrenia ([place])   . Recurrent pneumonia   . Dysphagia causing pulmonary aspiration with swallowing   . Pneumococcal pneumonia ([place])   . Arrhythmia   . Esophageal stricture   . Hypothyroidism   . Lung nodule   . Dementia in chronic schizophrenia ([place]) 08/12/2014  . Social discord 08/12/2014  . Family conflict 99/35/7017    Past Surgical History:  Procedure Laterality Date  . HERNIA REPAIR     hiatal  . JEJUNOSTOMY FEEDING TUBE    . PACEMAKER INSERTION    . TEE WITHOUT CARDIOVERSION N/A 10/11/2016   Procedure: TRANSESOPHAGEAL ECHOCARDIOGRAM (TEE);  Surgeon: Wellington Hampshire, MD;  Location: [facility] ORS;  Service: Cardiovascular;  Laterality: N/A;    Prior to Admission medications   Medication Sig Start Date End Date Taking? Authorizing Provider  acetaminophen (TYLENOL) 500 MG tablet Take 500 mg by mouth every 4 (four) hours as needed.    [provider]  alum & mag hydroxide-simeth (MINTOX) 200-200-20 MG/5ML suspension Take 30 mLs by mouth as needed for indigestion or heartburn.    [provider]  cetirizine (ZYRTEC) 10  MG tablet Take 10 mg by mouth daily.    [provider]  Cholecalciferol 2000 units TABS Take 2,000 Units by mouth daily.    [provider]  divalproex (DEPAKOTE ER) 500 MG 24 hr tablet Take 1 tablet (500 mg total) by mouth at bedtime. 11/19/16   Kathrine Haddock, NP  docusate sodium (COLACE) 100 MG capsule Take 100 mg by mouth 2 (two) times daily.    [provider]  ferrous sulfate 325 (65 FE) MG tablet Take  325 mg by mouth daily with breakfast.    [provider]  fluticasone (FLONASE) 50 MCG/ACT nasal spray Place 2 sprays into both nostrils daily.    [provider]  furosemide (LASIX) 20 MG tablet Take 20 mg by mouth every other day.    [provider]  gabapentin (NEURONTIN) 100 MG capsule Take 2 capsules (200 mg total) by mouth 3 (three) times daily. 11/19/16   Kathrine Haddock, NP  guaifenesin (ROBITUSSIN) 100 MG/5ML syrup Take 200 mg by mouth 4 (four) times daily as needed for cough.    [provider]  levothyroxine (SYNTHROID, LEVOTHROID) 125 MCG tablet Take 125 mcg by mouth daily before breakfast.     [provider]  loperamide (IMODIUM) 2 MG capsule Take 2 mg by mouth as needed for diarrhea or loose stools.    [provider]  magnesium hydroxide (MILK OF MAGNESIA) 400 MG/5ML suspension Take 30 mLs by mouth daily as needed for mild constipation.    [provider]  Melatonin 3 MG TABS Take 3 mg by mouth at bedtime.    [provider]  metoprolol tartrate (LOPRESSOR) 25 MG tablet Take 12.5 mg by mouth 2 (two) times daily.    [provider]  neomycin-bacitracin-polymyxin (NEOSPORIN) ointment Apply 1 application topically as needed for wound care. apply to eye    [provider]  ondansetron (ZOFRAN) 8 MG tablet Take 8 mg by mouth every 8 (eight) hours as needed for nausea or vomiting.    [provider]  potassium chloride (K-DUR,KLOR-CON) 10 MEQ tablet Take 10 mEq by mouth every other day.    [provider]  rivastigmine (EXELON) 4.6 mg/24hr Place 4.6 mg onto the skin daily.    [provider]  saccharomyces boulardii (FLORASTOR) 250 MG capsule Take 1 capsule (250 mg total) by mouth 2 (two) times daily. Patient taking differently: Take 250 mg by mouth daily.  01/01/15   Hosie Poisson, MD  senna (SENOKOT) 8.6 MG TABS tablet Take 1 tablet by mouth 2 (two) times a week. Mondays and  Fridays    [provider]  sertraline (ZOLOFT) 50 MG tablet Take 1 tablet (50 mg total) by mouth at bedtime. 11/19/16   Kathrine Haddock, NP  sodium chloride 1 G tablet Take 1 g by mouth 3 (three) times daily.    [provider]  traMADol (ULTRAM) 50 MG tablet Take 1 tablet (50 mg total) by mouth 2 (two) times daily as needed for moderate pain. Patient not taking: Reported on 11/19/2016 10/13/16   Hillary Bow, MD    Allergies Penicillins  Family History  Problem Relation Age of Onset  . Thyroid cancer Grandchild   . Thyroid nodules Daughter   . Thyroid cancer Daughter   . Graves' disease Daughter   . Dementia Mother   . Schizophrenia Maternal Aunt   . Thyroid disease Son   . Cancer Son   . Cancer Brother   . Cancer Brother  Social History Social History  Substance Use Topics  . Smoking status: Never Smoker  . Smokeless tobacco: Never Used  . Alcohol use No    Review of Systems  Constitutional: Negative for fever. Eyes: Negative for visual changes. ENT: Negative for sore throat. Neck: No neck pain  Cardiovascular: Negative for chest pain. Respiratory: Negative for shortness of breath. Gastrointestinal: Negative for abdominal pain, vomiting or diarrhea. Genitourinary: Negative for dysuria. Musculoskeletal: Negative for back pain. Skin: Negative for rash. Neurological: Negative for headaches, weakness or numbness. Psych: No SI or HI  ____________________________________________   PHYSICAL EXAM:  VITAL SIGNS: ED Triage Vitals  Enc Vitals Group     BP 12/22/16 1537 (!) 143/63     Pulse Rate 12/22/16 1537 90     Resp 12/22/16 1537 18     Temp 12/22/16 1537 98.3 F (36.8 C)     Temp Source 12/22/16 1537 Oral     SpO2 12/22/16 1537 96 %     Weight 12/22/16 1538 115 lb (52.2 kg)     Height --      Head Circumference --      Peak Flow --      Pain Score --      Pain Loc --      Pain Edu? --      Excl. in [place]? --     Constitutional:  Alert and oriented. Well appearing and in no apparent distress. HEENT:      Head: Normocephalic and atraumatic.         Eyes: Conjunctivae are normal. Sclera is non-icteric.       Mouth/Throat: Mucous membranes are moist.       Neck: Supple with no signs of meningismus. Cardiovascular: Regular rate and rhythm. No murmurs, gallops, or rubs. 2+ symmetrical distal pulses are present in all extremities. No JVD. Respiratory: Normal respiratory effort. Lungs are clear to auscultation bilaterally. No wheezes, crackles, or rhonchi.  Gastrointestinal: Soft, non tender, and non distended with positive bowel sounds. No rebound or guarding. Musculoskeletal: Nontender with normal range of motion in all extremities. No edema, cyanosis, or erythema of extremities. Neurologic: Normal speech and language. Face is symmetric. Moving all extremities. No gross focal neurologic deficits are appreciated. Skin: Skin is warm, dry and intact. No rash noted. Psychiatric: Mood and affect are normal. Speech and behavior are normal.  ____________________________________________   LABS (all labs ordered are listed, but only abnormal results are displayed)  Labs Reviewed  COMPREHENSIVE METABOLIC PANEL - Abnormal; Notable for the following:       Result Value   Glucose, Bld 245 (*)    AST 44 (*)    ALT 60 (*)    All other components within normal limits  URINALYSIS, COMPLETE (UACMP) WITH MICROSCOPIC - Abnormal; Notable for the following:    Color, Urine YELLOW (*)    APPearance CLEAR (*)    Leukocytes, UA MODERATE (*)    Bacteria, UA RARE (*)    Squamous Epithelial / LPF 0-5 (*)    All other components within normal limits  ETHANOL  CBC  URINE DRUG SCREEN, QUALITATIVE ([facility] ONLY)   ____________________________________________  EKG  none  ____________________________________________  RADIOLOGY  none  ____________________________________________   PROCEDURES  Procedure(s) performed:  None Procedures Critical Care performed:  None ____________________________________________   INITIAL IMPRESSION / ASSESSMENT AND PLAN / ED COURSE   83 y.o. female with a history of Alzheimer's disease and schizoaffective disorder who presents IVC'ed by her daughter for aggressive  behavior. Patient currently does not meet IVC criteria. Patient needs placement outside of daughter's home. Cleared by psychiatry. IVC lifted by Dr. Weber Cooks. UA pending. Mildly elevated LFTs on labs with no abdominal pain or ttp, no N/V.     _________________________ 7:05 PM on 12/22/2016 -----------------------------------------  + UTI will treat with fosfomycin.  Pertinent labs & imaging results that were available during my care of the patient were reviewed by me and considered in my medical decision making (see chart for details).    ____________________________________________   FINAL CLINICAL IMPRESSION(S) / ED DIAGNOSES  Final diagnoses:  Alzheimer's dementia without behavioral disturbance, unspecified timing of dementia onset  Acute cystitis without hematuria      NEW MEDICATIONS STARTED DURING THIS VISIT:  New Prescriptions   No medications on file     Note:  This document was prepared using Dragon voice recognition software and may include unintentional dictation errors.    Rudene Re, MD 12/22/16 Drema Halon

## 2016-12-22 NOTE — ED Notes (Signed)
BEHAVIORAL HEALTH ROUNDING Patient sleeping: No Patient alert and oriented: Yes Behavior appropriate: Yes Describe behavior: No inappropriate or unacceptable behaviors noted at this time.  Nutrition and fluids offered: Yes Toileting and hygiene offered: Yes Sitter present: Behavioral tech rounding every 15 minutes on patient to ensure safety.  Law enforcement present: Yes Event organiser agency: [place] (ODS)

## 2016-12-22 NOTE — ED Notes (Signed)

## 2016-12-22 NOTE — ED Notes (Addendum)
Stat desk called with number for this patient to call, [phone].  This number belongs to patient's daughter.  When I attempted call it, I received a "not in service" recording.  I verified the number with stat desk again.

## 2016-12-22 NOTE — ED Notes (Signed)
BEHAVIORAL HEALTH ROUNDING Patient sleeping: No Patient alert and oriented: Yes Behavior appropriate: Yes Describe behavior: No inappropriate or unacceptable behaviors noted at this time.  Nutrition and fluids offered: Yes Toileting and hygiene offered: Yes Sitter present: Behavioral tech rounding every 15 minutes on patient to ensure safety.  Law enforcement present: Yes Event organiser agency: [place] (ODS)
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
## 2016-12-22 NOTE — ED Notes (Signed)
Pt refused a meal tray, pt complained stating that the quad bed was uncomfortable and she would be unable to sleep on it for the night. Pt's bed was changed into a hospital bed/ Pt now lying on bed comfortably with no distress. Nothing needed from staff at this time.

## 2016-12-23 DIAGNOSIS — F250 Schizoaffective disorder, bipolar type: Secondary | ICD-10-CM | POA: Diagnosis not present

## 2016-12-23 DIAGNOSIS — G309 Alzheimer's disease, unspecified: Secondary | ICD-10-CM | POA: Diagnosis not present

## 2016-12-23 LAB — GLUCOSE, CAPILLARY
Glucose-Capillary: 132 mg/dL — ABNORMAL HIGH (ref 65–99)
Glucose-Capillary: 144 mg/dL — ABNORMAL HIGH (ref 65–99)
Glucose-Capillary: 125 mg/dL — ABNORMAL HIGH (ref 65–99)
Glucose-Capillary: 108 mg/dL — ABNORMAL HIGH (ref 65–99)

## 2016-12-23 NOTE — NC FL2 (Signed)
[place] LEVEL OF CARE SCREENING TOOL     IDENTIFICATION  Patient Name: Jill Copeland Birthdate: 07/09/1931 Sex: female Admission Date (Current Location): 12/22/2016  [facility] and [place] Number:  [name]  ([id number]) Facility and Address:  [facility], [address], [place],  [address]      Provider Number: [id number]  Attending Physician Name and Address:  No att. providers found  Relative Name and Phone Number:       Current Level of Care: Hospital Recommended Level of Care: [place] Prior Approval Number:    Date Approved/Denied:   PASRR Number:  [id number] )  Discharge Plan: Domiciliary (Rest home)    Current Diagnoses: Patient Active Problem List   Diagnosis Date Noted  . Advanced care planning/counseling discussion 11/19/2016  . Impaired mobility and ADLs 11/19/2016  . Adventitious breath sounds 11/19/2016  . Pacemaker infection ([place]) 10/11/2016  . Endocarditis 10/09/2016  . Schizoaffective disorder ([place])   . Pressure ulcer 01/13/2015  . Multiple falls 01/12/2015  . Overactive bladder 12/30/2014  . Alzheimer disease   . Schizo affective schizophrenia ([place])   . Recurrent pneumonia   . Dysphagia causing pulmonary aspiration with swallowing   . Pneumococcal pneumonia ([place])   . Arrhythmia   . Esophageal stricture   . Hypothyroidism   . Lung nodule   . Dementia in chronic schizophrenia ([place]) 08/12/2014  . Social discord 08/12/2014  . Family conflict 14/48/1856    Orientation RESPIRATION BLADDER Height & Weight     Self, Time, Place  Normal Continent Weight: 115 lb (52.2 kg) Height:     BEHAVIORAL SYMPTOMS/MOOD NEUROLOGICAL BOWEL NUTRITION STATUS      Continent Diet (Diet: Carb Modified )  AMBULATORY STATUS COMMUNICATION OF NEEDS Skin   Limited Assist Verbally Normal                       Personal Care Assistance Level of Assistance  Bathing, Feeding, Dressing Bathing  Assistance: Limited assistance Feeding assistance: Independent Dressing Assistance: Limited assistance     Functional Limitations Info  Sight, Hearing, Speech Sight Info: Adequate Hearing Info: Adequate Speech Info: Adequate    SPECIAL CARE FACTORS FREQUENCY  PT (By licensed PT)     PT Frequency:  (2-3 home health )              Contractures      Additional Factors Info  Code Status, Allergies Code Status Info:  (Full Code. ) Allergies Info:  (Penicillins )           Current Medications (12/23/2016):  This is the current hospital active medication list Current Facility-Administered Medications  Medication Dose Route Frequency Provider Last Rate Last Dose  . acetaminophen (TYLENOL) tablet 500 mg  500 mg Oral Q4H PRN Alfred Levins, [place], MD      . alum & mag hydroxide-simeth (MAALOX/MYLANTA) 200-200-20 MG/5ML suspension 30 mL  30 mL Oral PRN Alfred Levins, [place], MD      . cholecalciferol (VITAMIN D) tablet 2,000 Units  2,000 Units Oral Daily Alfred Levins, [place], MD      . divalproex (DEPAKOTE ER) 24 hr tablet 500 mg  500 mg Oral QHS Alfred Levins, [place], MD   500 mg at 12/22/16 2348  . docusate sodium (COLACE) capsule 100 mg  100 mg Oral BID Alfred Levins, [place], MD   100 mg at 12/22/16 2348  . ferrous sulfate tablet 325 mg  325 mg Oral Q breakfast Alfred Levins, [place], MD      .  fluticasone (FLONASE) 50 MCG/ACT nasal spray 2 spray  2 spray Each Nare Daily Alfred Levins, [place], MD      . furosemide (LASIX) tablet 20 mg  20 mg Oral Emmit Pomfret, [place], MD      . gabapentin (NEURONTIN) capsule 200 mg  200 mg Oral TID Alfred Levins, [place], MD   200 mg at 12/22/16 2350  . guaifenesin (ROBITUSSIN) 100 MG/5ML syrup 200 mg  200 mg Oral QID PRN Alfred Levins, [place], MD      . insulin aspart (novoLOG) injection 0-5 Units  0-5 Units Subcutaneous QHS Alfred Levins, [place], MD      . insulin aspart (novoLOG) injection 0-9 Units  0-9 Units Subcutaneous TID WC Alfred Levins, [place], MD      .  levothyroxine (SYNTHROID, LEVOTHROID) tablet 125 mcg  125 mcg Oral QAC breakfast Alfred Levins, [place], MD      . loperamide (IMODIUM) capsule 2 mg  2 mg Oral PRN Alfred Levins, [place], MD      . loratadine (CLARITIN) tablet 10 mg  10 mg Oral Daily Alfred Levins, [place], MD      . magnesium hydroxide (MILK OF MAGNESIA) suspension 30 mL  30 mL Oral Daily PRN Alfred Levins, [place], MD      . Melatonin TABS 2.5 mg  2.5 mg Oral QHS Alfred Levins, [place], MD   2.5 mg at 12/22/16 2350  . metoprolol tartrate (LOPRESSOR) tablet 12.5 mg  12.5 mg Oral BID Alfred Levins, [place], MD   12.5 mg at 12/22/16 2349  . OLANZapine zydis (ZYPREXA) disintegrating tablet 15 mg  15 mg Oral QHS Clapacs, Madie [place], MD   15 mg at 12/22/16 2201  . potassium chloride SA (K-DUR,KLOR-CON) CR tablet 10 mEq  10 mEq Oral Emmit Pomfret, [place], MD      . rivastigmine (EXELON) 4.6 mg/24hr 4.6 mg  4.6 mg Transdermal Daily Alfred Levins, [place], MD      . senna [facility]) tablet 8.6 mg  1 tablet Oral Once per day on Mon Thu [place], [place], MD      . sertraline (ZOLOFT) tablet 50 mg  50 mg Oral Shelly Coss, [place], MD   50 mg at 12/22/16 2353  . sodium chloride tablet 1 g  1 g Oral TID Rudene Re, MD   1 g at 12/22/16 2350   Current Outpatient Prescriptions  Medication Sig Dispense Refill  . acetaminophen (TYLENOL) 500 MG tablet Take 500 mg by mouth every 4 (four) hours as needed.    Marland Kitchen alum & mag hydroxide-simeth (MINTOX) 200-200-20 MG/5ML suspension Take 30 mLs by mouth as needed for indigestion or heartburn.    . cetirizine (ZYRTEC) 10 MG tablet Take 10 mg by mouth daily.    . Cholecalciferol 2000 units TABS Take 2,000 Units by mouth daily.    . divalproex (DEPAKOTE ER) 500 MG 24 hr tablet Take 1 tablet (500 mg total) by mouth at bedtime. 90 tablet 1  . docusate sodium (COLACE) 100 MG capsule Take 100 mg by mouth 2 (two) times daily.    . ferrous sulfate 325 (65 FE) MG tablet Take 325 mg by mouth daily with breakfast.    . fluticasone  (FLONASE) 50 MCG/ACT nasal spray Place 2 sprays into both nostrils daily.    . furosemide (LASIX) 20 MG tablet Take 20 mg by mouth every other day.    . gabapentin (NEURONTIN) 100 MG capsule Take 2 capsules (200 mg total) by mouth 3 (three) times daily. 640 capsule 1  . guaifenesin (ROBITUSSIN) 100 MG/5ML syrup Take 200 mg by mouth 4 (  four) times daily as needed for cough.    . levothyroxine (SYNTHROID, LEVOTHROID) 125 MCG tablet Take 125 mcg by mouth daily before breakfast.     . loperamide (IMODIUM) 2 MG capsule Take 2 mg by mouth as needed for diarrhea or loose stools.    . magnesium hydroxide (MILK OF MAGNESIA) 400 MG/5ML suspension Take 30 mLs by mouth daily as needed for mild constipation.    . Melatonin 3 MG TABS Take 3 mg by mouth at bedtime.    . metoprolol tartrate (LOPRESSOR) 25 MG tablet Take 12.5 mg by mouth 2 (two) times daily.    Marland Kitchen neomycin-bacitracin-polymyxin (NEOSPORIN) ointment Apply 1 application topically as needed for wound care. apply to eye    . ondansetron (ZOFRAN) 8 MG tablet Take 8 mg by mouth every 8 (eight) hours as needed for nausea or vomiting.    . potassium chloride (K-DUR,KLOR-CON) 10 MEQ tablet Take 10 mEq by mouth every other day.    . rivastigmine (EXELON) 4.6 mg/24hr Place 4.6 mg onto the skin daily.    Marland Kitchen saccharomyces boulardii (FLORASTOR) 250 MG capsule Take 1 capsule (250 mg total) by mouth 2 (two) times daily. (Patient taking differently: Take 250 mg by mouth daily. ) 30 capsule 0  . senna (SENOKOT) 8.6 MG TABS tablet Take 1 tablet by mouth 2 (two) times a week. Mondays and Fridays    . sertraline (ZOLOFT) 50 MG tablet Take 1 tablet (50 mg total) by mouth at bedtime. 90 tablet 1  . sodium chloride 1 G tablet Take 1 g by mouth 3 (three) times daily.    . traMADol (ULTRAM) 50 MG tablet Take 1 tablet (50 mg total) by mouth 2 (two) times daily as needed for moderate pain. (Patient not taking: Reported on 11/19/2016) 10 tablet 0     Discharge  Medications: Please see discharge summary for a list of discharge medications.  Relevant Imaging Results:  Relevant Lab Results:   Additional Information  (SSN: [id number])  Maddyx Wieck, Veronia Beets, LCSW

## 2016-12-23 NOTE — ED Notes (Signed)
BEHAVIORAL HEALTH ROUNDING Patient sleeping: No. Patient alert and oriented: yes Behavior appropriate: Yes.  ; If no, describe:  Nutrition and fluids offered: yes Toileting and hygiene offered: Yes  Sitter present: q15 minute observations and security  monitoring Law enforcement present: Yes  ODS  

## 2016-12-23 NOTE — ED Provider Notes (Signed)
Vitals:   12/22/16 2205 12/23/16 0629  BP: (!) 140/99 (!) 158/74  Pulse: 76 (!) 58  Resp: 17 18  Temp: 98.3 F (36.8 C)   SpO2: 98% 97%   No acute events reported to me from overnight shift.  Patient is awaiting geri-psych placement.  She is resting comfortably this AM.    Lisa Roca, MD 12/23/16 [phone]

## 2016-12-23 NOTE — ED Notes (Signed)
Pt given supper tray. Pt requesting kleenex.

## 2016-12-23 NOTE — ED Notes (Signed)
Breakfast tray given to pt. Pt ambulated to bathroom unassisted with no difficulties.

## 2016-12-23 NOTE — ED Notes (Signed)
Pt CBG performed, RN with pt and pt given lunch tray

## 2016-12-23 NOTE — ED Notes (Signed)

## 2016-12-23 NOTE — ED Notes (Signed)

## 2016-12-23 NOTE — ED Notes (Signed)
ED  Is the patient under IVC or is there intent for IVC: Yes.   Is the patient medically cleared: Yes.   Is there vacancy in the ED BHU:  Is the population mix appropriate for patient:  Is the patient awaiting placement in inpatient or outpatient setting:  She is medically and psychiatrically cleared  Pt awaiting acceptance to a long term care facility  Has the patient had a psychiatric consult: Yes.   Survey of unit performed for contraband, proper placement and condition of furniture, tampering with fixtures in bathroom, shower, and each patient room: Yes.  ; Findings:  APPEARANCE/BEHAVIOR Calm and cooperative NEURO ASSESSMENT Orientation: oriented x3  Denies pain Hallucinations: No.None noted (Hallucinations) Speech: Normal Gait: normal  Stand by assistance  RESPIRATORY ASSESSMENT Even  Unlabored respirations  CARDIOVASCULAR ASSESSMENT Pulses equal   regular rate  Skin warm and dry   GASTROINTESTINAL ASSESSMENT no GI complaint EXTREMITIES Full ROM  PLAN OF CARE Provide calm/safe environment. Vital signs assessed twice daily. ED BHU Assessment once each 12-hour shift. Collaborate with TTS when available or as condition indicates. Assure the ED provider has rounded once each shift. Provide and encourage hygiene. Provide redirection as needed. Assess for escalating behavior; address immediately and inform ED provider.  Assess family dynamic and appropriateness for visitation as needed: Yes.  ; If necessary, describe findings:  Educate the patient/family about BHU procedures/visitation: Yes.  ; If necessary, describe findings:

## 2016-12-23 NOTE — ED Notes (Signed)
CBG 132. RN notified.

## 2016-12-23 NOTE — ED Notes (Signed)
Patient observed lying in bed with eyes closed  Even, unlabored respirations observed   NAD pt appears to be sleeping  I will continue to monitor along with every 15 minute visual observations and ongoing security monitoring    

## 2016-12-23 NOTE — Consult Note (Signed)
[facility] Consult   Reason for Consult:  Consult for 83 year old woman with a history of mental health problems was brought to the emergency room under IVC by her daughter Referring Physician:  Alfred Levins Patient Identification: Jill Copeland MRN:  [id number] Principal Diagnosis: Schizoaffective disorder [facility]) Diagnosis:   Patient Active Problem List   Diagnosis Date Noted  . Advanced care planning/counseling discussion [Z71.89] 11/19/2016  . Impaired mobility and ADLs [Z74.09] 11/19/2016  . Adventitious breath sounds [R06.89] 11/19/2016  . Pacemaker infection ([place]) [T82.7XXA] 10/11/2016  . Endocarditis [I38] 10/09/2016  . Schizoaffective disorder ([place]) [F25.9]   . Pressure ulcer [L89.90] 01/13/2015  . Multiple falls [R29.6] 01/12/2015  . Overactive bladder [N32.81] 12/30/2014  . Alzheimer disease [G30.9, F02.80]   . Schizo affective schizophrenia ([place]) [F25.0]   . Recurrent pneumonia [J18.9]   . Dysphagia causing pulmonary aspiration with swallowing [R13.19]   . Pneumococcal pneumonia ([place]) [J13]   . Arrhythmia [I49.9]   . Esophageal stricture [K22.2]   . Hypothyroidism [E03.9]   . Lung nodule [R91.1]   . Dementia in chronic schizophrenia ([place]) [F20.5] 08/12/2014  . Social discord [Z65.8] 08/12/2014  . Family conflict [R51.8] 84/16/6063    Total Time spent with patient: 20 minutes  This is a follow-up for this 83 year old woman with a history of mental health problems. Patient has no new complaints today. She is a little disorganized in her speech but has not been aggressive or violent. She is compliant with medicine. No new physical problems. No new indication for hospitalization or change to treatment plan.  Subjective:   Jill Copeland is a 83 y.o. female patient admitted with "I had double pneumonia".  HPI:  Patient interviewed chart reviewed. A 45-year-old woman with an established history of schizophrenia. She was brought in under involuntary  commitment papers filed by her daughter which allege, in very few words, that the patient had been violent at home. Since being in the emergency room the patient has been cooperative with no signs of violence. She tells me a rambling story about how her daughter is "mean" to her and is always telling her to "shut up". She tells me that she tries her best to be nice to everyone at home but feels like they are unpleasant to her. She absolutely denies any physical contact or fighting at all. Admits that they got into a verbal squabble but denies making any threats. Patient says her mood is fine. Sleep is a little impaired at night. Appetite is okay. Energy level impaired. She gets tired out easily and has trouble going for a walk. Patient denies any suicidal or homicidal thoughts. She is rambling and a little bit disorganized but didn't make any bizarre or obviously delusional statements and hasn't been threatening.  Medical history: Patient has a recent history of pneumonia followed up by infective endocarditis. Was pretty sick.  Social history: Apparently since leaving the hospital she has been back staying with her daughter. This arrangement has worked out all that well in the past. Patient tells me now she would rather be living at [facility] or [facility] were one of the other assisted living facilities.  Substance abuse history: No evidence of recent substance abuse no past substance abuse history  Past Psychiatric History: Patient has a history of a diagnosis of schizophrenia. When we last saw her from the psychiatric service several years ago she was routinely on Zyprexa. Does not have a history of suicide attempts. Has a history of some agitation  when psychotic and has prior hospitalizations. Also some degree of dementia although it does not seem to of progressed much  Risk to Self: Is patient at risk for suicide?: No Risk to Others:   Prior Inpatient Therapy:   Prior Outpatient Therapy:     Past Medical History:  Past Medical History:  Diagnosis Date  . Alzheimer disease   . Arthritis   . Bacteremia    a. 09/2016 - admit w/ S hominis bacteremia w/ ? of pacer lead vegetation;  b. 09/2016 TEE:  unable to fully pass TEE probe due to esoph stricture; c. IV Abx per ID.  . Diabetes mellitus without complication ([place])   . Dysphagia causing pulmonary aspiration with swallowing   . Esophageal stricture    a. @ site of anastamosis from hiatal hernia surgery.  Marland Kitchen GERD (gastroesophageal reflux disease)   . Hiatal hernia    a. s/p prior repair with fistula;  b. 09/2016 CT chest: moderate to large hiatal hernia - stable.  . Hypothyroidism   . Pneumococcal pneumonia ([place])   . Recurrent pneumonia   . Recurrent UTI   . Schizo affective schizophrenia ([place])   . Stroke (cerebrum) ([place])   . Symptomatic bradycardia    a. s/p biotronik PPM 02/2005 at [facility] in [place], [place]  . Syncope    a. 02/2005 s/p Biotronik DC PPM @ [facility] in [place], [place].    Past Surgical History:  Procedure Laterality Date  . HERNIA REPAIR     hiatal  . JEJUNOSTOMY FEEDING TUBE    . PACEMAKER INSERTION    . TEE WITHOUT CARDIOVERSION N/A 10/11/2016   Procedure: TRANSESOPHAGEAL ECHOCARDIOGRAM (TEE);  Surgeon: Wellington Hampshire, MD;  Location: [facility] ORS;  Service: Cardiovascular;  Laterality: N/A;   Family History:  Family History  Problem Relation Age of Onset  . Thyroid cancer Grandchild   . Thyroid nodules Daughter   . Thyroid cancer Daughter   . Graves' disease Daughter   . Dementia Mother   . Schizophrenia Maternal Aunt   . Thyroid disease Son   . Cancer Son   . Cancer Brother   . Cancer Brother    Family Psychiatric  History: No known family history Social History:  History  Alcohol Use No     History  Drug Use No    Social History   Social History  . Marital status: Widowed    Spouse name: N/A  . Number of children: N/A  . Years of education: N/A   Social History  Main Topics  . Smoking status: Never Smoker  . Smokeless tobacco: Never Used  . Alcohol use No  . Drug use: No  . Sexual activity: Not Asked   Other Topics Concern  . None   Social History Narrative  . None   Additional Social History:    Allergies:   Allergies  Allergen Reactions  . Penicillins Other (See Comments)    Labs:  Results for orders placed or performed during the hospital encounter of 12/22/16 (from the past 48 hour(s))  Comprehensive metabolic panel     Status: Abnormal   Collection Time: 12/22/16  3:40 PM  Result Value Ref Range   Sodium 137 135 - 145 mmol/L   Potassium 4.0 3.5 - 5.1 mmol/L   Chloride 104 101 - 111 mmol/L   CO2 23 22 - 32 mmol/L   Glucose, Bld 245 (H) 65 - 99 mg/dL   BUN 18 6 - 20 mg/dL   Creatinine,  Ser 0.84 0.44 - 1.00 mg/dL   Calcium 9.8 8.9 - 10.3 mg/dL   Total Protein 8.1 6.5 - 8.1 g/dL   Albumin 4.1 3.5 - 5.0 g/dL   AST 44 (H) 15 - 41 U/L   ALT 60 (H) 14 - 54 U/L   Alkaline Phosphatase 108 38 - 126 U/L   Total Bilirubin 0.5 0.3 - 1.2 mg/dL   GFR calc non Af Amer >60 >60 mL/min   GFR calc Af Amer >60 >60 mL/min    Comment: (NOTE) The eGFR has been calculated using the CKD EPI equation. This calculation has not been validated in all clinical situations. eGFR's persistently <60 mL/min signify possible Chronic Kidney Disease.    Anion gap 10 5 - 15  Ethanol     Status: None   Collection Time: 12/22/16  3:40 PM  Result Value Ref Range   Alcohol, Ethyl (B) <10 <10 mg/dL    Comment:        LOWEST DETECTABLE LIMIT FOR SERUM ALCOHOL IS 10 mg/dL FOR MEDICAL PURPOSES ONLY Please note change in reference range.   cbc     Status: None   Collection Time: 12/22/16  3:40 PM  Result Value Ref Range   WBC 8.8 3.6 - 11.0 K/uL   RBC 4.90 3.80 - 5.20 MIL/uL   Hemoglobin 14.9 12.0 - 16.0 g/dL   HCT 43.1 35.0 - 47.0 %   MCV 88.0 80.0 - 100.0 fL   MCH 30.4 26.0 - 34.0 pg   MCHC 34.5 32.0 - 36.0 g/dL   RDW 14.3 11.5 - 14.5 %    Platelets 212 150 - 440 K/uL  Urine Drug Screen, Qualitative     Status: None   Collection Time: 12/22/16  5:00 PM  Result Value Ref Range   Tricyclic, Ur Screen NONE DETECTED NONE DETECTED   Amphetamines, Ur Screen NONE DETECTED NONE DETECTED   MDMA (Ecstasy)Ur Screen NONE DETECTED NONE DETECTED   Cocaine Metabolite,Ur [place] NONE DETECTED NONE DETECTED   Opiate, Ur Screen NONE DETECTED NONE DETECTED   Phencyclidine (PCP) Ur S NONE DETECTED NONE DETECTED   Cannabinoid 50 Ng, Ur [place] NONE DETECTED NONE DETECTED   Barbiturates, Ur Screen NONE DETECTED NONE DETECTED   Benzodiazepine, Ur Scrn NONE DETECTED NONE DETECTED   Methadone Scn, Ur NONE DETECTED NONE DETECTED    Comment: (NOTE) 830  Tricyclics, urine               Cutoff 1000 ng/mL 200  Amphetamines, urine             Cutoff 1000 ng/mL 300  MDMA (Ecstasy), urine           Cutoff 500 ng/mL 400  Cocaine Metabolite, urine       Cutoff 300 ng/mL 500  Opiate, urine                   Cutoff 300 ng/mL 600  Phencyclidine (PCP), urine      Cutoff 25 ng/mL 700  Cannabinoid, urine              Cutoff 50 ng/mL 800  Barbiturates, urine             Cutoff 200 ng/mL 900  Benzodiazepine, urine           Cutoff 200 ng/mL 1000 Methadone, urine                Cutoff 300 ng/mL 1100 1200 The urine drug screen provides only  a preliminary, unconfirmed 1300 analytical test result and should not be used for non-medical 1400 purposes. Clinical consideration and professional judgment should 1500 be applied to any positive drug screen result due to possible 1600 interfering substances. A more specific alternate chemical method 1700 must be used in order to obtain a confirmed analytical result.  1800 Gas chromato graphy / mass spectrometry (GC/MS) is the preferred 1900 confirmatory method.   Urinalysis, Complete w Microscopic     Status: Abnormal   Collection Time: 12/22/16  5:00 PM  Result Value Ref Range   Color, Urine YELLOW (A) YELLOW   APPearance  CLEAR (A) CLEAR   Specific Gravity, Urine 1.010 1.005 - 1.030   pH 5.0 5.0 - 8.0   Glucose, UA NEGATIVE NEGATIVE mg/dL   Hgb urine dipstick NEGATIVE NEGATIVE   Bilirubin Urine NEGATIVE NEGATIVE   Ketones, ur NEGATIVE NEGATIVE mg/dL   Protein, ur NEGATIVE NEGATIVE mg/dL   Nitrite NEGATIVE NEGATIVE   Leukocytes, UA MODERATE (A) NEGATIVE   RBC / HPF 0-5 0 - 5 RBC/hpf   WBC, UA 0-5 0 - 5 WBC/hpf   Bacteria, UA RARE (A) NONE SEEN   Squamous Epithelial / LPF 0-5 (A) NONE SEEN  Glucose, capillary     Status: Abnormal   Collection Time: 12/22/16 11:15 PM  Result Value Ref Range   Glucose-Capillary 103 (H) 65 - 99 mg/dL  Glucose, capillary     Status: Abnormal   Collection Time: 12/23/16  8:06 AM  Result Value Ref Range   Glucose-Capillary 132 (H) 65 - 99 mg/dL  Glucose, capillary     Status: Abnormal   Collection Time: 12/23/16 12:57 PM  Result Value Ref Range   Glucose-Capillary 144 (H) 65 - 99 mg/dL   Comment 1 Notify RN     Current Facility-Administered Medications  Medication Dose Route Frequency Provider Last Rate Last Dose  . acetaminophen (TYLENOL) tablet 500 mg  500 mg Oral Q4H PRN Alfred Levins, [place], MD      . alum & mag hydroxide-simeth (MAALOX/MYLANTA) 200-200-20 MG/5ML suspension 30 mL  30 mL Oral PRN Rudene Re, MD      . cholecalciferol (VITAMIN D) tablet 2,000 Units  2,000 Units Oral Daily Alfred Levins, [place], MD   2,000 Units at 12/23/16 1307  . divalproex (DEPAKOTE ER) 24 hr tablet 500 mg  500 mg Oral QHS Alfred Levins, [place], MD   500 mg at 12/22/16 2348  . docusate sodium (COLACE) capsule 100 mg  100 mg Oral BID Alfred Levins, [place], MD   100 mg at 12/23/16 1310  . ferrous sulfate tablet 325 mg  325 mg Oral Q breakfast Alfred Levins, [place], MD   325 mg at 12/23/16 1312  . fluticasone (FLONASE) 50 MCG/ACT nasal spray 2 spray  2 spray Each Nare Daily Alfred Levins, [place], MD   2 spray at 12/23/16 1316  . furosemide (LASIX) tablet 20 mg  20 mg Oral Emmit Pomfret,  [place], MD   20 mg at 12/23/16 1305  . gabapentin (NEURONTIN) capsule 200 mg  200 mg Oral TID Alfred Levins, [place], MD   200 mg at 12/23/16 1313  . guaifenesin (ROBITUSSIN) 100 MG/5ML syrup 200 mg  200 mg Oral QID PRN Alfred Levins, [place], MD      . insulin aspart (novoLOG) injection 0-5 Units  0-5 Units Subcutaneous QHS Alfred Levins, [place], MD      . insulin aspart (novoLOG) injection 0-9 Units  0-9 Units Subcutaneous TID WC Alfred Levins, [place], MD      . levothyroxine (SYNTHROID, LEVOTHROID) tablet  125 mcg  125 mcg Oral QAC breakfast Alfred Levins, [place], MD   125 mcg at 12/23/16 1309  . loperamide (IMODIUM) capsule 2 mg  2 mg Oral PRN Alfred Levins, [place], MD      . loratadine (CLARITIN) tablet 10 mg  10 mg Oral Daily Alfred Levins, [place], MD   10 mg at 12/23/16 1309  . magnesium hydroxide (MILK OF MAGNESIA) suspension 30 mL  30 mL Oral Daily PRN Alfred Levins, [place], MD      . Melatonin TABS 2.5 mg  2.5 mg Oral QHS Alfred Levins, [place], MD   2.5 mg at 12/22/16 2350  . metoprolol tartrate (LOPRESSOR) tablet 12.5 mg  12.5 mg Oral BID Alfred Levins, [place], MD   12.5 mg at 12/23/16 1308  . OLANZapine zydis (ZYPREXA) disintegrating tablet 15 mg  15 mg Oral QHS Marek Nghiem, Madie [place], MD   15 mg at 12/22/16 2201  . potassium chloride SA (K-DUR,KLOR-CON) CR tablet 10 mEq  10 mEq Oral Emmit Pomfret, [place], MD   10 mEq at 12/23/16 1306  . rivastigmine (EXELON) 4.6 mg/24hr 4.6 mg  4.6 mg Transdermal Daily Alfred Levins, [place], MD   4.6 mg at 12/23/16 1314  . senna (SENOKOT) tablet 8.6 mg  1 tablet Oral Once per day on Mon Thu Veronese, [place], MD   8.6 mg at 12/23/16 1319  . sertraline (ZOLOFT) tablet 50 mg  50 mg Oral QHS Alfred Levins, [place], MD   50 mg at 12/22/16 2353  . sodium chloride tablet 1 g  1 g Oral TID Rudene Re, MD   1 g at 12/23/16 1313   Current Outpatient Prescriptions  Medication Sig Dispense Refill  . acetaminophen (TYLENOL) 500 MG tablet Take 500 mg by mouth every 4 (four) hours as needed.     Marland Kitchen alum & mag hydroxide-simeth (MINTOX) 200-200-20 MG/5ML suspension Take 30 mLs by mouth as needed for indigestion or heartburn.    . cetirizine (ZYRTEC) 10 MG tablet Take 10 mg by mouth daily.    . Cholecalciferol 2000 units TABS Take 2,000 Units by mouth daily.    . divalproex (DEPAKOTE ER) 500 MG 24 hr tablet Take 1 tablet (500 mg total) by mouth at bedtime. 90 tablet 1  . docusate sodium (COLACE) 100 MG capsule Take 100 mg by mouth 2 (two) times daily.    . ferrous sulfate 325 (65 FE) MG tablet Take 325 mg by mouth daily with breakfast.    . fluticasone (FLONASE) 50 MCG/ACT nasal spray Place 2 sprays into both nostrils daily.    . furosemide (LASIX) 20 MG tablet Take 20 mg by mouth every other day.    . gabapentin (NEURONTIN) 100 MG capsule Take 2 capsules (200 mg total) by mouth 3 (three) times daily. 640 capsule 1  . guaifenesin (ROBITUSSIN) 100 MG/5ML syrup Take 200 mg by mouth 4 (four) times daily as needed for cough.    . levothyroxine (SYNTHROID, LEVOTHROID) 125 MCG tablet Take 125 mcg by mouth daily before breakfast.     . loperamide (IMODIUM) 2 MG capsule Take 2 mg by mouth as needed for diarrhea or loose stools.    . magnesium hydroxide (MILK OF MAGNESIA) 400 MG/5ML suspension Take 30 mLs by mouth daily as needed for mild constipation.    . Melatonin 3 MG TABS Take 3 mg by mouth at bedtime.    . metoprolol tartrate (LOPRESSOR) 25 MG tablet Take 12.5 mg by mouth 2 (two) times daily.    Marland Kitchen neomycin-bacitracin-polymyxin (NEOSPORIN) ointment Apply 1 application topically as needed  for wound care. apply to eye    . ondansetron (ZOFRAN) 8 MG tablet Take 8 mg by mouth every 8 (eight) hours as needed for nausea or vomiting.    . potassium chloride (K-DUR,KLOR-CON) 10 MEQ tablet Take 10 mEq by mouth every other day.    . rivastigmine (EXELON) 4.6 mg/24hr Place 4.6 mg onto the skin daily.    Marland Kitchen saccharomyces boulardii (FLORASTOR) 250 MG capsule Take 1 capsule (250 mg total) by mouth 2 (two)  times daily. (Patient taking differently: Take 250 mg by mouth daily. ) 30 capsule 0  . senna (SENOKOT) 8.6 MG TABS tablet Take 1 tablet by mouth 2 (two) times a week. Mondays and Fridays    . sertraline (ZOLOFT) 50 MG tablet Take 1 tablet (50 mg total) by mouth at bedtime. 90 tablet 1  . sodium chloride 1 G tablet Take 1 g by mouth 3 (three) times daily.    . traMADol (ULTRAM) 50 MG tablet Take 1 tablet (50 mg total) by mouth 2 (two) times daily as needed for moderate pain. (Patient not taking: Reported on 11/19/2016) 10 tablet 0    Musculoskeletal: Strength & Muscle Tone: within normal limits Gait & Station: normal Patient leans: N/A  Psychiatric Specialty Exam: Physical Exam  Nursing note and vitals reviewed. Constitutional: She appears well-developed and well-nourished.  HENT:  Head: Normocephalic and atraumatic.  Eyes: Pupils are equal, round, and reactive to light. Conjunctivae are normal.  Neck: Normal range of motion.  Cardiovascular: Normal heart sounds.   Respiratory: Effort normal.  GI: Soft.  Musculoskeletal: Normal range of motion.  Neurological: She is alert.  Skin: Skin is warm and dry.  Psychiatric: Her affect is blunt. Her speech is tangential. She is agitated. She is not aggressive. Thought content is paranoid. Cognition and memory are impaired. She expresses impulsivity. She expresses no homicidal and no suicidal ideation.    Review of Systems  Constitutional: Negative.   HENT: Negative.   Eyes: Negative.   Respiratory: Negative.   Cardiovascular: Negative.   Gastrointestinal: Negative.   Musculoskeletal: Negative.   Skin: Negative.   Neurological: Negative.   Psychiatric/Behavioral: Negative for depression, hallucinations, memory loss, substance abuse and suicidal ideas. The patient is not nervous/anxious and does not have insomnia.     Blood pressure (!) 158/74, pulse (!) 58, temperature 98.3 F (36.8 C), temperature source Oral, resp. rate 18, weight 52.2  kg (115 lb), SpO2 97 %.Body mass index is 20.37 kg/m.  General Appearance: Casual  Eye Contact:  Good  Speech:  Normal Rate  Volume:  Normal  Mood:  Euthymic  Affect:  Congruent  Thought Process:  Goal Directed  Orientation:  Full (Time, Place, and Person)  Thought Content:  Paranoid Ideation, Rumination and Tangential  Suicidal Thoughts:  No  Homicidal Thoughts:  No  Memory:  Immediate;   Fair Recent;   Fair Remote;   Fair  Judgement:  Impaired  Insight:  Shallow  Psychomotor Activity:  Normal  Concentration:  Concentration: Fair  Recall:  [employer] of Knowledge:  Fair  Language:  Fair  Akathisia:  No  Handed:  Right  AIMS (if indicated):     Assets:  Communication Skills Desire for Improvement Housing Resilience  ADL's:  Intact  Cognition:  Impaired,  Mild  Sleep:        Treatment Plan Summary: Medication management and Plan Patient was referred yesterday for placement either back with her daughter or in another facility. Progress is still pending  on that front. No change to medicine. Encouraged patient to continue being patient. Case reviewed with the ER physician  Disposition: Patient does not meet criteria for psychiatric inpatient admission.  Alethia Berthold, MD 12/23/2016 5:08 PM
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
## 2016-12-23 NOTE — Progress Notes (Signed)
Patient's daughter Jill Copeland called Clinical Social Worker (CSW) back and stated that she can be reached at this number [phone]. Jill Copeland is agreeable for patient to D/C to [facility] and will call Thayer Headings from [facility] to complete admissions paper work.   [employer], LCSW [phone]

## 2016-12-23 NOTE — Progress Notes (Signed)
[place] administrator came to [facility] to assess patient. Per Thayer Headings she can accept patient in her [facility] building in [place]. Per Thayer Headings she will accept patient with a pending ALF PASARR. CSW submitted for an ALF PASARR and it is pending. CSW gave Thayer Headings signed FL2 and face sheet. Per Thayer Headings she will have to get patient's daughter Lattie Haw is sign patient into [facility]. CSW has attempted to contact patient's daughter Lattie Haw several times with no success. Thayer Headings has tried to contact [place] with no success. CSW contacted [place] [employer] and requested a wellness check on patient's daughter Lattie Haw. Per Thayer Headings she will pick patient up tomorrow morning if she can get in contact with Lattie Haw.   [employer], LCSW [phone]

## 2016-12-23 NOTE — ED Notes (Signed)
BEHAVIORAL HEALTH ROUNDING Patient sleeping: Yes.   Patient alert and oriented: eyes closed  Appears to be asleep Behavior appropriate: Yes.  ; If no, describe:  Nutrition and fluids offered: Yes  Toileting and hygiene offered: sleeping Sitter present: q 15 minute observations and security monitoring Law enforcement present: yes  ODS 

## 2016-12-23 NOTE — Progress Notes (Addendum)
Clinical Education officer, museum (CSW) received placement consult. Per RN patient's daughter brought her into [facility] under IVC for her behaviors. Per nurse patient is medically and psychiatrically clear and has not had any behaviors while at [facility]. Per nurse patient is requesting to go live at [facility] or [facility]. CSW contacted [facility] who stated that she is familiar with the patient and patient was a resident at [facility]. Patient was at [facility] then came to [facility] in July 2018 was placed at [facility] for IV ABX and then the family decided to take patient home from [facility] instead of patient going back to [facility]. Per Thayer Headings she will came evaluate patient this afternoon to see if she is appropriate to come back to [facility]. RN will request a PT consult from MD. CSW attempted to contact patient's daughters however Lattie Haw did not have a voicemail box set up and CSW left [employer].   CSW received a call back from patient's daughter Rosann Auerbach stating that she has not spoken with the patient or her sister Lattie Haw in years and is not involved. Per Rosann Auerbach she cannot take care of patient and is almost homeless herself and lives in a camper. Rosann Auerbach requested to be taken off patient's contact list.   [facility], LCSW [phone]

## 2016-12-24 DIAGNOSIS — G309 Alzheimer's disease, unspecified: Secondary | ICD-10-CM | POA: Diagnosis not present

## 2016-12-24 LAB — GLUCOSE, CAPILLARY: GLUCOSE-CAPILLARY: 125 mg/dL — AB (ref 65–99)

## 2016-12-24 NOTE — Care Management Note (Signed)
Case Management Note  Patient Details  Name: Brynli Ollis MRN: [id number] Date of Birth: [birth year]  Subjective/Objective:       Referral called to Corene Cornea from [place] [phone] for home health PT. The address needs to be clarified before d/c. I will text or e-mail this to him once confirmed.Order in EPIC by MD.             Action/Plan:   Expected Discharge Date:                  Expected Discharge Plan:     In-House Referral:     Discharge planning Services     Post Acute Care Choice:    Choice offered to:     DME Arranged:    DME Agency:     HH Arranged:    HH Agency:     Status of Service:     If discussed at [employer] of Stay Meetings, dates discussed:    Additional Comments:  Beau Fanny, RN 12/24/2016, 9:30 AM

## 2016-12-24 NOTE — Care Management Note (Signed)
Case Management Note  Patient Details  Name: Jill Copeland MRN: [id number] Date of Birth: May 06, 1931  Subjective/Objective:       Verified with provider that the address for the patient will be [place] E-mailed this information to [place] at [facility].             Action/Plan:   Expected Discharge Date:                  Expected Discharge Plan:     In-House Referral:     Discharge planning Services     Post Acute Care Choice:    Choice offered to:     DME Arranged:    DME Agency:     HH Arranged:    HH Agency:     Status of Service:     If discussed at [employer] of Stay Meetings, dates discussed:    Additional Comments:  Beau Fanny, RN 12/24/2016, 10:44 AM

## 2016-12-24 NOTE — ED Provider Notes (Signed)
-----------------------------------------   7:35 AM on 12/24/2016 -----------------------------------------  The patient had no acute events since last update.  Calm and cooperative at this time.  Disposition is pending Psychiatry/Behavioral Medicine team recommendations.  Apparently social work is evaluating for possible home health   Lavonia Drafts, MD 12/24/16 [phone]

## 2016-12-24 NOTE — ED Provider Notes (Signed)
Patient is being discharged to [facility], Herbie [place], MD 12/24/16 1011

## 2016-12-24 NOTE — ED Notes (Signed)
Thayer Headings from [facility] living at bedside to transport patient to facility. Patient dressed in her clothes and bag of belongings sent with patient. Chart printed and given to [facility]

## 2016-12-24 NOTE — Progress Notes (Addendum)
ALF PAS[facility] has been received, [id number] O. RN case manager will arrange home health at [facility]. Clinical Education officer, museum (CSW) is waiting on confirmation from Counselling psychologist at [facility] about accepting patient today.   Thayer Headings came to [facility] and picked patient up. Patient discharged to [facility] today. Please reconsult if future social work needs arise. CSW signing off.   [employer], LCSW [phone]

## 2016-12-28 DIAGNOSIS — E119 Type 2 diabetes mellitus without complications: Secondary | ICD-10-CM | POA: Diagnosis not present

## 2016-12-28 DIAGNOSIS — G309 Alzheimer's disease, unspecified: Secondary | ICD-10-CM | POA: Diagnosis not present

## 2016-12-29 DIAGNOSIS — G3184 Mild cognitive impairment, so stated: Secondary | ICD-10-CM | POA: Diagnosis not present

## 2016-12-29 DIAGNOSIS — R451 Restlessness and agitation: Secondary | ICD-10-CM | POA: Diagnosis not present

## 2016-12-30 DIAGNOSIS — E119 Type 2 diabetes mellitus without complications: Secondary | ICD-10-CM | POA: Diagnosis not present

## 2016-12-30 DIAGNOSIS — R1310 Dysphagia, unspecified: Secondary | ICD-10-CM | POA: Diagnosis not present

## 2016-12-30 DIAGNOSIS — M1991 Primary osteoarthritis, unspecified site: Secondary | ICD-10-CM | POA: Diagnosis not present

## 2016-12-30 DIAGNOSIS — G309 Alzheimer's disease, unspecified: Secondary | ICD-10-CM | POA: Diagnosis not present

## 2016-12-30 DIAGNOSIS — Z87440 Personal history of urinary (tract) infections: Secondary | ICD-10-CM | POA: Diagnosis not present

## 2016-12-31 DIAGNOSIS — E119 Type 2 diabetes mellitus without complications: Secondary | ICD-10-CM | POA: Diagnosis not present

## 2016-12-31 DIAGNOSIS — M1991 Primary osteoarthritis, unspecified site: Secondary | ICD-10-CM | POA: Diagnosis not present

## 2016-12-31 DIAGNOSIS — R1310 Dysphagia, unspecified: Secondary | ICD-10-CM | POA: Diagnosis not present

## 2016-12-31 DIAGNOSIS — Z87440 Personal history of urinary (tract) infections: Secondary | ICD-10-CM | POA: Diagnosis not present

## 2016-12-31 DIAGNOSIS — G309 Alzheimer's disease, unspecified: Secondary | ICD-10-CM | POA: Diagnosis not present

## 2017-01-04 DIAGNOSIS — E119 Type 2 diabetes mellitus without complications: Secondary | ICD-10-CM | POA: Diagnosis not present

## 2017-01-04 DIAGNOSIS — R1310 Dysphagia, unspecified: Secondary | ICD-10-CM | POA: Diagnosis not present

## 2017-01-04 DIAGNOSIS — G309 Alzheimer's disease, unspecified: Secondary | ICD-10-CM | POA: Diagnosis not present

## 2017-01-04 DIAGNOSIS — M1991 Primary osteoarthritis, unspecified site: Secondary | ICD-10-CM | POA: Diagnosis not present

## 2017-01-04 DIAGNOSIS — Z87440 Personal history of urinary (tract) infections: Secondary | ICD-10-CM | POA: Diagnosis not present

## 2017-01-05 DIAGNOSIS — E119 Type 2 diabetes mellitus without complications: Secondary | ICD-10-CM | POA: Diagnosis not present

## 2017-01-05 DIAGNOSIS — Z87440 Personal history of urinary (tract) infections: Secondary | ICD-10-CM | POA: Diagnosis not present

## 2017-01-05 DIAGNOSIS — R1310 Dysphagia, unspecified: Secondary | ICD-10-CM | POA: Diagnosis not present

## 2017-01-05 DIAGNOSIS — G309 Alzheimer's disease, unspecified: Secondary | ICD-10-CM | POA: Diagnosis not present

## 2017-01-05 DIAGNOSIS — M1991 Primary osteoarthritis, unspecified site: Secondary | ICD-10-CM | POA: Diagnosis not present

## 2017-01-06 DIAGNOSIS — G309 Alzheimer's disease, unspecified: Secondary | ICD-10-CM | POA: Diagnosis not present

## 2017-01-06 DIAGNOSIS — Z87440 Personal history of urinary (tract) infections: Secondary | ICD-10-CM | POA: Diagnosis not present

## 2017-01-06 DIAGNOSIS — M1991 Primary osteoarthritis, unspecified site: Secondary | ICD-10-CM | POA: Diagnosis not present

## 2017-01-06 DIAGNOSIS — E119 Type 2 diabetes mellitus without complications: Secondary | ICD-10-CM | POA: Diagnosis not present

## 2017-01-06 DIAGNOSIS — R1310 Dysphagia, unspecified: Secondary | ICD-10-CM | POA: Diagnosis not present

## 2017-01-07 DIAGNOSIS — R1310 Dysphagia, unspecified: Secondary | ICD-10-CM | POA: Diagnosis not present

## 2017-01-07 DIAGNOSIS — E119 Type 2 diabetes mellitus without complications: Secondary | ICD-10-CM | POA: Diagnosis not present

## 2017-01-07 DIAGNOSIS — M1991 Primary osteoarthritis, unspecified site: Secondary | ICD-10-CM | POA: Diagnosis not present

## 2017-01-07 DIAGNOSIS — Z87440 Personal history of urinary (tract) infections: Secondary | ICD-10-CM | POA: Diagnosis not present

## 2017-01-07 DIAGNOSIS — G309 Alzheimer's disease, unspecified: Secondary | ICD-10-CM | POA: Diagnosis not present

## 2017-01-10 DIAGNOSIS — R1310 Dysphagia, unspecified: Secondary | ICD-10-CM | POA: Diagnosis not present

## 2017-01-10 DIAGNOSIS — M1991 Primary osteoarthritis, unspecified site: Secondary | ICD-10-CM | POA: Diagnosis not present

## 2017-01-10 DIAGNOSIS — E119 Type 2 diabetes mellitus without complications: Secondary | ICD-10-CM | POA: Diagnosis not present

## 2017-01-10 DIAGNOSIS — Z87440 Personal history of urinary (tract) infections: Secondary | ICD-10-CM | POA: Diagnosis not present

## 2017-01-10 DIAGNOSIS — G309 Alzheimer's disease, unspecified: Secondary | ICD-10-CM | POA: Diagnosis not present

## 2017-01-11 DIAGNOSIS — M1991 Primary osteoarthritis, unspecified site: Secondary | ICD-10-CM | POA: Diagnosis not present

## 2017-01-11 DIAGNOSIS — E119 Type 2 diabetes mellitus without complications: Secondary | ICD-10-CM | POA: Diagnosis not present

## 2017-01-11 DIAGNOSIS — G309 Alzheimer's disease, unspecified: Secondary | ICD-10-CM | POA: Diagnosis not present

## 2017-01-11 DIAGNOSIS — R1310 Dysphagia, unspecified: Secondary | ICD-10-CM | POA: Diagnosis not present

## 2017-01-11 DIAGNOSIS — Z87440 Personal history of urinary (tract) infections: Secondary | ICD-10-CM | POA: Diagnosis not present

## 2017-01-11 DIAGNOSIS — E039 Hypothyroidism, unspecified: Secondary | ICD-10-CM | POA: Diagnosis not present

## 2017-01-12 DIAGNOSIS — G309 Alzheimer's disease, unspecified: Secondary | ICD-10-CM | POA: Diagnosis not present

## 2017-01-12 DIAGNOSIS — R1310 Dysphagia, unspecified: Secondary | ICD-10-CM | POA: Diagnosis not present

## 2017-01-12 DIAGNOSIS — E119 Type 2 diabetes mellitus without complications: Secondary | ICD-10-CM | POA: Diagnosis not present

## 2017-01-12 DIAGNOSIS — M1991 Primary osteoarthritis, unspecified site: Secondary | ICD-10-CM | POA: Diagnosis not present

## 2017-01-12 DIAGNOSIS — Z87440 Personal history of urinary (tract) infections: Secondary | ICD-10-CM | POA: Diagnosis not present

## 2017-01-13 DIAGNOSIS — R1310 Dysphagia, unspecified: Secondary | ICD-10-CM | POA: Diagnosis not present

## 2017-01-13 DIAGNOSIS — R451 Restlessness and agitation: Secondary | ICD-10-CM | POA: Diagnosis not present

## 2017-01-13 DIAGNOSIS — G309 Alzheimer's disease, unspecified: Secondary | ICD-10-CM | POA: Diagnosis not present

## 2017-01-13 DIAGNOSIS — E119 Type 2 diabetes mellitus without complications: Secondary | ICD-10-CM | POA: Diagnosis not present

## 2017-01-13 DIAGNOSIS — M1991 Primary osteoarthritis, unspecified site: Secondary | ICD-10-CM | POA: Diagnosis not present

## 2017-01-13 DIAGNOSIS — G3184 Mild cognitive impairment, so stated: Secondary | ICD-10-CM | POA: Diagnosis not present

## 2017-01-13 DIAGNOSIS — Z87440 Personal history of urinary (tract) infections: Secondary | ICD-10-CM | POA: Diagnosis not present

## 2017-01-17 DIAGNOSIS — D518 Other vitamin B12 deficiency anemias: Secondary | ICD-10-CM | POA: Diagnosis not present

## 2017-01-17 DIAGNOSIS — M1991 Primary osteoarthritis, unspecified site: Secondary | ICD-10-CM | POA: Diagnosis not present

## 2017-01-17 DIAGNOSIS — R1310 Dysphagia, unspecified: Secondary | ICD-10-CM | POA: Diagnosis not present

## 2017-01-17 DIAGNOSIS — Z79899 Other long term (current) drug therapy: Secondary | ICD-10-CM | POA: Diagnosis not present

## 2017-01-17 DIAGNOSIS — E038 Other specified hypothyroidism: Secondary | ICD-10-CM | POA: Diagnosis not present

## 2017-01-17 DIAGNOSIS — G4709 Other insomnia: Secondary | ICD-10-CM | POA: Diagnosis not present

## 2017-01-17 DIAGNOSIS — Z87440 Personal history of urinary (tract) infections: Secondary | ICD-10-CM | POA: Diagnosis not present

## 2017-01-17 DIAGNOSIS — E119 Type 2 diabetes mellitus without complications: Secondary | ICD-10-CM | POA: Diagnosis not present

## 2017-01-17 DIAGNOSIS — E559 Vitamin D deficiency, unspecified: Secondary | ICD-10-CM | POA: Diagnosis not present

## 2017-01-17 DIAGNOSIS — R451 Restlessness and agitation: Secondary | ICD-10-CM | POA: Diagnosis not present

## 2017-01-17 DIAGNOSIS — G309 Alzheimer's disease, unspecified: Secondary | ICD-10-CM | POA: Diagnosis not present

## 2017-01-17 DIAGNOSIS — E7849 Other hyperlipidemia: Secondary | ICD-10-CM | POA: Diagnosis not present

## 2017-01-18 DIAGNOSIS — R1310 Dysphagia, unspecified: Secondary | ICD-10-CM | POA: Diagnosis not present

## 2017-01-18 DIAGNOSIS — E119 Type 2 diabetes mellitus without complications: Secondary | ICD-10-CM | POA: Diagnosis not present

## 2017-01-18 DIAGNOSIS — Z87440 Personal history of urinary (tract) infections: Secondary | ICD-10-CM | POA: Diagnosis not present

## 2017-01-18 DIAGNOSIS — G309 Alzheimer's disease, unspecified: Secondary | ICD-10-CM | POA: Diagnosis not present

## 2017-01-18 DIAGNOSIS — M1991 Primary osteoarthritis, unspecified site: Secondary | ICD-10-CM | POA: Diagnosis not present

## 2017-01-19 DIAGNOSIS — Z87440 Personal history of urinary (tract) infections: Secondary | ICD-10-CM | POA: Diagnosis not present

## 2017-01-19 DIAGNOSIS — M1991 Primary osteoarthritis, unspecified site: Secondary | ICD-10-CM | POA: Diagnosis not present

## 2017-01-19 DIAGNOSIS — G309 Alzheimer's disease, unspecified: Secondary | ICD-10-CM | POA: Diagnosis not present

## 2017-01-19 DIAGNOSIS — R1310 Dysphagia, unspecified: Secondary | ICD-10-CM | POA: Diagnosis not present

## 2017-01-19 DIAGNOSIS — E119 Type 2 diabetes mellitus without complications: Secondary | ICD-10-CM | POA: Diagnosis not present

## 2017-01-21 DIAGNOSIS — Z87440 Personal history of urinary (tract) infections: Secondary | ICD-10-CM | POA: Diagnosis not present

## 2017-01-21 DIAGNOSIS — M1991 Primary osteoarthritis, unspecified site: Secondary | ICD-10-CM | POA: Diagnosis not present

## 2017-01-21 DIAGNOSIS — E119 Type 2 diabetes mellitus without complications: Secondary | ICD-10-CM | POA: Diagnosis not present

## 2017-01-21 DIAGNOSIS — G309 Alzheimer's disease, unspecified: Secondary | ICD-10-CM | POA: Diagnosis not present

## 2017-01-21 DIAGNOSIS — R1310 Dysphagia, unspecified: Secondary | ICD-10-CM | POA: Diagnosis not present

## 2017-01-26 DIAGNOSIS — E119 Type 2 diabetes mellitus without complications: Secondary | ICD-10-CM | POA: Diagnosis not present

## 2017-01-26 DIAGNOSIS — M1991 Primary osteoarthritis, unspecified site: Secondary | ICD-10-CM | POA: Diagnosis not present

## 2017-01-26 DIAGNOSIS — G309 Alzheimer's disease, unspecified: Secondary | ICD-10-CM | POA: Diagnosis not present

## 2017-01-26 DIAGNOSIS — R1310 Dysphagia, unspecified: Secondary | ICD-10-CM | POA: Diagnosis not present

## 2017-01-26 DIAGNOSIS — Z87440 Personal history of urinary (tract) infections: Secondary | ICD-10-CM | POA: Diagnosis not present

## 2017-01-30 DIAGNOSIS — M1991 Primary osteoarthritis, unspecified site: Secondary | ICD-10-CM | POA: Diagnosis not present

## 2017-01-30 DIAGNOSIS — Z87440 Personal history of urinary (tract) infections: Secondary | ICD-10-CM | POA: Diagnosis not present

## 2017-01-30 DIAGNOSIS — G309 Alzheimer's disease, unspecified: Secondary | ICD-10-CM | POA: Diagnosis not present

## 2017-01-30 DIAGNOSIS — E119 Type 2 diabetes mellitus without complications: Secondary | ICD-10-CM | POA: Diagnosis not present

## 2017-01-30 DIAGNOSIS — R1310 Dysphagia, unspecified: Secondary | ICD-10-CM | POA: Diagnosis not present

## 2017-01-31 DIAGNOSIS — E119 Type 2 diabetes mellitus without complications: Secondary | ICD-10-CM | POA: Diagnosis not present

## 2017-01-31 DIAGNOSIS — Z87440 Personal history of urinary (tract) infections: Secondary | ICD-10-CM | POA: Diagnosis not present

## 2017-01-31 DIAGNOSIS — R1310 Dysphagia, unspecified: Secondary | ICD-10-CM | POA: Diagnosis not present

## 2017-01-31 DIAGNOSIS — M1991 Primary osteoarthritis, unspecified site: Secondary | ICD-10-CM | POA: Diagnosis not present

## 2017-01-31 DIAGNOSIS — G309 Alzheimer's disease, unspecified: Secondary | ICD-10-CM | POA: Diagnosis not present

## 2017-02-01 DIAGNOSIS — R1310 Dysphagia, unspecified: Secondary | ICD-10-CM | POA: Diagnosis not present

## 2017-02-01 DIAGNOSIS — G309 Alzheimer's disease, unspecified: Secondary | ICD-10-CM | POA: Diagnosis not present

## 2017-02-01 DIAGNOSIS — E119 Type 2 diabetes mellitus without complications: Secondary | ICD-10-CM | POA: Diagnosis not present

## 2017-02-01 DIAGNOSIS — Z87440 Personal history of urinary (tract) infections: Secondary | ICD-10-CM | POA: Diagnosis not present

## 2017-02-01 DIAGNOSIS — M1991 Primary osteoarthritis, unspecified site: Secondary | ICD-10-CM | POA: Diagnosis not present

## 2017-02-02 DIAGNOSIS — R748 Abnormal levels of other serum enzymes: Secondary | ICD-10-CM | POA: Diagnosis not present

## 2017-02-02 DIAGNOSIS — G309 Alzheimer's disease, unspecified: Secondary | ICD-10-CM | POA: Diagnosis not present

## 2017-02-03 DIAGNOSIS — E119 Type 2 diabetes mellitus without complications: Secondary | ICD-10-CM | POA: Diagnosis not present

## 2017-02-03 DIAGNOSIS — M1991 Primary osteoarthritis, unspecified site: Secondary | ICD-10-CM | POA: Diagnosis not present

## 2017-02-03 DIAGNOSIS — R1310 Dysphagia, unspecified: Secondary | ICD-10-CM | POA: Diagnosis not present

## 2017-02-03 DIAGNOSIS — Z87440 Personal history of urinary (tract) infections: Secondary | ICD-10-CM | POA: Diagnosis not present

## 2017-02-03 DIAGNOSIS — G309 Alzheimer's disease, unspecified: Secondary | ICD-10-CM | POA: Diagnosis not present

## 2017-02-04 ENCOUNTER — Telehealth: Payer: Self-pay

## 2017-02-04 NOTE — Telephone Encounter (Signed)
Called to schedule medicare wellness visit with patient, spoke with her daughter Lattie Haw. Patient is currently in nursing home. She also requested to cancel upcoming follow up with Regino Schultze since she is being seen  in the nursing home.

## 2017-02-08 DIAGNOSIS — R1310 Dysphagia, unspecified: Secondary | ICD-10-CM | POA: Diagnosis not present

## 2017-02-08 DIAGNOSIS — M1991 Primary osteoarthritis, unspecified site: Secondary | ICD-10-CM | POA: Diagnosis not present

## 2017-02-08 DIAGNOSIS — E039 Hypothyroidism, unspecified: Secondary | ICD-10-CM | POA: Diagnosis not present

## 2017-02-08 DIAGNOSIS — G309 Alzheimer's disease, unspecified: Secondary | ICD-10-CM | POA: Diagnosis not present

## 2017-02-08 DIAGNOSIS — Z87440 Personal history of urinary (tract) infections: Secondary | ICD-10-CM | POA: Diagnosis not present

## 2017-02-08 DIAGNOSIS — E119 Type 2 diabetes mellitus without complications: Secondary | ICD-10-CM | POA: Diagnosis not present

## 2017-02-11 ENCOUNTER — Emergency Department
Admission: EM | Admit: 2017-02-11 | Discharge: 2017-02-11 | Disposition: A | Discharge status: Home / Self Care | Payer: Medicare Other | Attending: Emergency Medicine | Admitting: Emergency Medicine

## 2017-02-11 ENCOUNTER — Emergency Department: Payer: Medicare Other

## 2017-02-11 ENCOUNTER — Encounter: Payer: Self-pay | Admitting: Emergency Medicine

## 2017-02-11 ENCOUNTER — Emergency Department
Admission: EM | Admit: 2017-02-11 | Discharge: 2017-02-11 | Disposition: A | Discharge status: Skilled Nursing Facility | Payer: Medicare Other | Source: Home / Self Care | Attending: Emergency Medicine | Admitting: Emergency Medicine

## 2017-02-11 ENCOUNTER — Other Ambulatory Visit: Payer: Self-pay

## 2017-02-11 DIAGNOSIS — E119 Type 2 diabetes mellitus without complications: Secondary | ICD-10-CM | POA: Diagnosis not present

## 2017-02-11 DIAGNOSIS — Z8673 Personal history of transient ischemic attack (TIA), and cerebral infarction without residual deficits: Secondary | ICD-10-CM | POA: Insufficient documentation

## 2017-02-11 DIAGNOSIS — R0602 Shortness of breath: Secondary | ICD-10-CM

## 2017-02-11 DIAGNOSIS — J810 Acute pulmonary edema: Secondary | ICD-10-CM | POA: Diagnosis not present

## 2017-02-11 DIAGNOSIS — E039 Hypothyroidism, unspecified: Secondary | ICD-10-CM | POA: Insufficient documentation

## 2017-02-11 DIAGNOSIS — R9431 Abnormal electrocardiogram [ECG] [EKG]: Secondary | ICD-10-CM | POA: Diagnosis not present

## 2017-02-11 DIAGNOSIS — F0280 Dementia in other diseases classified elsewhere without behavioral disturbance: Secondary | ICD-10-CM | POA: Insufficient documentation

## 2017-02-11 DIAGNOSIS — Z950 Presence of cardiac pacemaker: Secondary | ICD-10-CM | POA: Insufficient documentation

## 2017-02-11 DIAGNOSIS — Z79899 Other long term (current) drug therapy: Secondary | ICD-10-CM

## 2017-02-11 DIAGNOSIS — G309 Alzheimer's disease, unspecified: Secondary | ICD-10-CM | POA: Insufficient documentation

## 2017-02-11 DIAGNOSIS — J189 Pneumonia, unspecified organism: Secondary | ICD-10-CM | POA: Diagnosis not present

## 2017-02-11 LAB — CBC WITH DIFFERENTIAL/PLATELET
BASOS ABS: 0 10*3/uL (ref 0–0.1)
Basophils Relative: 0 %
EOS PCT: 2 %
Eosinophils Absolute: 0.1 10*3/uL (ref 0–0.7)
HCT: 40.1 % (ref 35.0–47.0)
Hemoglobin: 13.2 g/dL (ref 12.0–16.0)
LYMPHS PCT: 17 %
Lymphs Abs: 1.3 10*3/uL (ref 1.0–3.6)
MCH: 29.3 pg (ref 26.0–34.0)
MCHC: 32.9 g/dL (ref 32.0–36.0)
MCV: 89.1 fL (ref 80.0–100.0)
MONO ABS: 0.6 10*3/uL (ref 0.2–0.9)
MONOS PCT: 8 %
Neutro Abs: 5.6 10*3/uL (ref 1.4–6.5)
Neutrophils Relative %: 73 %
PLATELETS: 149 10*3/uL — AB (ref 150–440)
RBC: 4.5 MIL/uL (ref 3.80–5.20)
RDW: 13.3 % (ref 11.5–14.5)
WBC: 7.6 10*3/uL (ref 3.6–11.0)

## 2017-02-11 LAB — COMPREHENSIVE METABOLIC PANEL
ALK PHOS: 70 U/L (ref 38–126)
ALT: 13 U/L — AB (ref 14–54)
AST: 26 U/L (ref 15–41)
Albumin: 3.4 g/dL — ABNORMAL LOW (ref 3.5–5.0)
Anion gap: 9 (ref 5–15)
BUN: 20 mg/dL (ref 6–20)
CALCIUM: 9.1 mg/dL (ref 8.9–10.3)
CO2: 29 mmol/L (ref 22–32)
CREATININE: 1.09 mg/dL — AB (ref 0.44–1.00)
Chloride: 102 mmol/L (ref 101–111)
GFR, EST AFRICAN AMERICAN: 53 mL/min — AB (ref >60)
GFR, EST NON AFRICAN AMERICAN: 46 mL/min — AB (ref >60)
Glucose, Bld: 198 mg/dL — ABNORMAL HIGH (ref 65–99)
Potassium: 3.9 mmol/L (ref 3.5–5.1)
Sodium: 140 mmol/L (ref 135–145)
Total Bilirubin: 0.6 mg/dL (ref 0.3–1.2)
Total Protein: 7.9 g/dL (ref 6.5–8.1)

## 2017-02-11 LAB — BRAIN NATRIURETIC PEPTIDE: B NATRIURETIC PEPTIDE 5: 233 pg/mL — AB (ref 0.0–100.0)

## 2017-02-11 LAB — TROPONIN I: TROPONIN I: 0.03 ng/mL — AB (ref <0.03)

## 2017-02-11 MED ORDER — MIDAZOLAM HCL 2 MG/2ML IJ SOLN
2.0000 mg | Freq: Once | INTRAMUSCULAR | Status: AC
  Administered 2017-02-11: 2 mg via INTRAVENOUS
  Filled 2017-02-11: qty 2

## 2017-02-11 MED ORDER — IPRATROPIUM-ALBUTEROL 0.5-2.5 (3) MG/3ML IN SOLN
3.0000 mL | Freq: Once | RESPIRATORY_TRACT | Status: AC
  Administered 2017-02-11: 3 mL via RESPIRATORY_TRACT
  Filled 2017-02-11: qty 3

## 2017-02-11 MED ORDER — ALBUTEROL SULFATE HFA 108 (90 BASE) MCG/ACT IN AERS: 2.0000 | INHALATION_SPRAY | Freq: Four times a day (QID) | RESPIRATORY_TRACT | 0 refills | Status: DC | PRN

## 2017-02-11 MED ORDER — LEVOFLOXACIN 500 MG PO TABS
500.0000 mg | ORAL_TABLET | Freq: Once | ORAL | Status: AC
  Administered 2017-02-11: 500 mg via ORAL
  Filled 2017-02-11: qty 1

## 2017-02-11 MED ORDER — LEVOFLOXACIN 500 MG PO TABS: 500.0000 mg | ORAL_TABLET | Freq: Every day | ORAL | 0 refills | Status: AC

## 2017-02-11 MED ORDER — MIDAZOLAM HCL 5 MG/5ML IJ SOLN
INTRAMUSCULAR | Status: AC
  Administered 2017-02-11: 2 mg via INTRAVENOUS
  Filled 2017-02-11: qty 5

## 2017-02-11 MED ORDER — SODIUM CHLORIDE 0.9 % IV BOLUS (SEPSIS)
500.0000 mL | Freq: Once | INTRAVENOUS | Status: AC
  Administered 2017-02-11: 500 mL via INTRAVENOUS

## 2017-02-11 NOTE — ED Notes (Signed)
Bed alarm placed on pt d/t at previous visit today she was trying to get out of bed unassisted

## 2017-02-11 NOTE — ED Triage Notes (Signed)
Pt to ED via [facility] from [facility] for shortness of breath. Pt informed staff at [facility] that she felt more short of breath than normal, staff also felt pt was working harder to breath. Pt in NAD at this time.

## 2017-02-11 NOTE — ED Notes (Signed)
Spent time talking with pt and listening to her concerns - she wants her daughter to come home from vacation and take her home with her because she does not want to go "back to the home" - attempted to reassure pt that she would be fine and that the "home" would take good care of her until her daughter returned

## 2017-02-11 NOTE — ED Notes (Signed)
RN Helene Kelp called [facility] facility and gave report.

## 2017-02-11 NOTE — ED Triage Notes (Addendum)
Pt was seen in ED earlier today for same c/o shortness of breath and pt was dx with "pneumonia" per pt - after several hours back at assisted living she c/o shortness of breath and they called ems again

## 2017-02-11 NOTE — ED Notes (Signed)
Pt in bed resting

## 2017-02-11 NOTE — ED Provider Notes (Signed)
Jill Copeland  ____________________________________________   First MD Initiated Contact with Patient 02/11/17 1329     (approximate)  I have reviewed the triage vital signs and the nursing notes.   HISTORY  Chief Complaint Shortness of Breath  Level 5 exemption history limited by the patient's dementia  HPI Jill Copeland is a 83 y.o. female who comes to the emergency department via EMS with reported shortness of breath.  According to the patient she did not want him to call 911 to bring her to the hospital but her nursing home insisted.  According to EMS the patient reported 45 minutes of shortness of breath.  Patient says she has felt lightheaded and somewhat short of breath for the past week or so.   10/05/16 Echo: Study Conclusions  - Left ventricle: The cavity size was normal. Wall thickness was   increased in a pattern of mild LVH. Systolic function was normal.   The estimated ejection fraction was in the range of 60% to 65%.   Wall motion was normal; there were no regional wall motion   abnormalities. The study is not technically sufficient to allow   evaluation of LV diastolic function.   Past Medical History:  Diagnosis Date  . Alzheimer disease   . Arthritis   . Bacteremia    a. 09/2016 - admit w/ S hominis bacteremia w/ ? of pacer lead vegetation;  b. 09/2016 TEE:  unable to fully pass TEE probe due to esoph stricture; c. IV Abx per ID.  . Diabetes mellitus without complication ([place])   . Dysphagia causing pulmonary aspiration with swallowing   . Esophageal stricture    a. @ site of anastamosis from hiatal hernia surgery.  Marland Kitchen GERD (gastroesophageal reflux disease)   . Hiatal hernia    a. s/p prior repair with fistula;  b. 09/2016 CT chest: moderate to large hiatal hernia - stable.  . Hypothyroidism   . Pneumococcal pneumonia ([place])   . Recurrent pneumonia   . Recurrent UTI   . Schizo affective  schizophrenia ([place])   . Stroke (cerebrum) ([place])   . Symptomatic bradycardia    a. s/p biotronik PPM 02/2005 at [facility] in [place], [place]  . Syncope    a. 02/2005 s/p Biotronik DC PPM @ [facility] in [place], [place].    Patient Active Problem List   Diagnosis Date Noted  . Advanced care planning/counseling discussion 11/19/2016  . Impaired mobility and ADLs 11/19/2016  . Adventitious breath sounds 11/19/2016  . Pacemaker infection ([place]) 10/11/2016  . Endocarditis 10/09/2016  . Schizoaffective disorder ([place])   . Pressure ulcer 01/13/2015  . Multiple falls 01/12/2015  . Overactive bladder 12/30/2014  . Alzheimer disease   . Schizo affective schizophrenia ([place])   . Recurrent pneumonia   . Dysphagia causing pulmonary aspiration with swallowing   . Pneumococcal pneumonia ([place])   . Arrhythmia   . Esophageal stricture   . Hypothyroidism   . Lung nodule   . Dementia in chronic schizophrenia ([place]) 08/12/2014  . Social discord 08/12/2014  . Family conflict [id number]    Past Surgical History:  Procedure Laterality Date  . HERNIA REPAIR     hiatal  . JEJUNOSTOMY FEEDING TUBE    . PACEMAKER INSERTION    . TEE WITHOUT CARDIOVERSION N/A 10/11/2016   Procedure: TRANSESOPHAGEAL ECHOCARDIOGRAM (TEE);  Surgeon: Wellington Hampshire, MD;  Location: [facility] ORS;  Service: Cardiovascular;  Laterality: N/A;    Prior to Admission  medications   Medication Sig Start Date End Date Taking? Authorizing Provider  Cholecalciferol 2000 units TABS Take 2,000 Units by mouth daily.   Yes [provider]  clonazePAM (KLONOPIN) 0.5 MG tablet Take 0.25 mg by mouth at bedtime.   Yes [provider]  divalproex (DEPAKOTE ER) 500 MG 24 hr tablet Take 1 tablet (500 mg total) by mouth at bedtime. Patient taking differently: Take 250-500 mg by mouth daily. TAKE 2 TABLETS BY MOUTH DAILY FOR 5 DAYS THEN TAKE 1 TABLET BY MOUTH DAILY (BEGINNING 02/13/2017) 11/19/16  Yes Kathrine Haddock, NP    docusate sodium (COLACE) 100 MG capsule Take 100 mg by mouth 2 (two) times daily.   Yes [provider]  ferrous sulfate 325 (65 FE) MG tablet Take 325 mg by mouth daily with breakfast.   Yes [provider]  fluticasone (FLONASE) 50 MCG/ACT nasal spray Place 2 sprays into both nostrils daily.   Yes [provider]  furosemide (LASIX) 20 MG tablet Take 20 mg by mouth every other day.   Yes [provider]  gabapentin (NEURONTIN) 100 MG capsule Take 2 capsules (200 mg total) by mouth 3 (three) times daily. 11/19/16  Yes Kathrine Haddock, NP  levothyroxine (SYNTHROID, LEVOTHROID) 125 MCG tablet Take 125 mcg by mouth daily before breakfast.    Yes [provider]  loratadine (CLARITIN) 10 MG tablet Take 10 mg by mouth daily.   Yes [provider]  Melatonin 3 MG TABS Take 3 mg by mouth at bedtime.   Yes [provider]  memantine (NAMENDA) 5 MG tablet Take 5 mg by mouth 2 (two) times daily.   Yes [provider]  metoprolol tartrate (LOPRESSOR) 25 MG tablet Take 12.5 mg by mouth 2 (two) times daily.   Yes [provider]  OLANZapine (ZYPREXA) 10 MG tablet Take 10 mg by mouth at bedtime.   Yes [provider]  saccharomyces boulardii (FLORASTOR) 250 MG capsule Take 1 capsule (250 mg total) by mouth 2 (two) times daily. 01/01/15  Yes Hosie Poisson, MD  senna (SENOKOT) 8.6 MG TABS tablet Take 1 tablet by mouth 2 (two) times a week. Mondays and Fridays   Yes [provider]  sodium chloride 1 G tablet Take 1 g by mouth 3 (three) times daily.   Yes [provider]  acetaminophen (TYLENOL) 500 MG tablet Take 500 mg by mouth every 4 (four) hours as needed.    [provider]  albuterol (PROVENTIL HFA;VENTOLIN HFA) 108 (90 Base) MCG/ACT inhaler Inhale 2 puffs into the lungs every 6 (six) hours as needed for wheezing or shortness of breath. 02/11/17   Darel Hong, MD  levofloxacin (LEVAQUIN)  500 MG tablet Take 1 tablet (500 mg total) by mouth daily for 10 days. 02/11/17 02/21/17  Darel Hong, MD  loperamide (IMODIUM) 2 MG capsule Take 2 mg by mouth as needed for diarrhea or loose stools.    [provider]  ondansetron (ZOFRAN) 8 MG tablet Take 8 mg by mouth every 8 (eight) hours as needed for nausea or vomiting.    [provider]  potassium chloride (K-DUR,KLOR-CON) 10 MEQ tablet Take 10 mEq by mouth every other day.    [provider]  sertraline (ZOLOFT) 50 MG tablet Take 1 tablet (50 mg total) by mouth at bedtime. Patient not taking: Reported on 02/11/2017 11/19/16   Kathrine Haddock, NP  traMADol (ULTRAM) 50 MG tablet Take 1 tablet (50 mg total) by mouth 2 (two)  times daily as needed for moderate pain. 10/13/16   Hillary Bow, MD    Allergies Penicillins  Family History  Problem Relation Age of Onset  . Thyroid cancer Grandchild   . Thyroid nodules Daughter   . Thyroid cancer Daughter   . Graves' disease Daughter   . Dementia Mother   . Schizophrenia Maternal Aunt   . Thyroid disease Son   . Cancer Son   . Cancer Brother   . Cancer Brother     Social History Social History   Tobacco Use  . Smoking status: Never Smoker  . Smokeless tobacco: Never Used  Substance Use Topics  . Alcohol use: No    Alcohol/week: 0.0 oz  . Drug use: No    Review of Systems Level 5 exemption history limited by the patient's dementia ____________________________________________   PHYSICAL EXAM:  VITAL SIGNS: ED Triage Vitals  Enc Vitals Group     BP      Pulse      Resp      Temp      Temp src      SpO2      Weight      Height      Head Circumference      Peak Flow      Pain Score      Pain Loc      Pain Edu?      Excl. in [place]?     Constitutional: Pleasant cooperative no distress no diaphoresis Eyes: PERRL EOMI. Head: Atraumatic. Nose: No congestion/rhinnorhea. Mouth/Throat: No trismus Neck: No stridor.  Able lie completely  flat no JVD  Cardiovascular: Normal rate, regular rhythm. Grossly normal heart sounds.  Good peripheral circulation. Respiratory: Normal respiratory effort.  No retractions.  Mild crackles throughout particularly in lower lung fields Gastrointestinal: Soft nontender Musculoskeletal: No lower extremity edema   Neurologic:  Normal speech and language. No gross focal neurologic deficits are appreciated. Skin:  Skin is warm, dry and intact. No rash noted. Psychiatric: Clearly with dementia    ____________________________________________   DIFFERENTIAL includes but not limited to  Pneumonia, pneumothorax, pulmonary embolism, congestive heart failure ____________________________________________   LABS (all labs ordered are listed, but only abnormal results are displayed)  Labs Reviewed  COMPREHENSIVE METABOLIC PANEL - Abnormal; Notable for the following components:      Result Value   Glucose, Bld 198 (*)    Creatinine, Ser 1.09 (*)    Albumin 3.4 (*)    ALT 13 (*)    GFR calc non Af Amer 46 (*)    GFR calc Af Amer 53 (*)    All other components within normal limits  TROPONIN I - Abnormal; Notable for the following components:   Troponin I 0.03 (*)    All other components within normal limits  BRAIN NATRIURETIC PEPTIDE - Abnormal; Notable for the following components:   B Natriuretic Peptide 233.0 (*)    All other components within normal limits  CBC WITH DIFFERENTIAL/PLATELET - Abnormal; Notable for the following components:   Platelets 149 (*)    All other components within normal limits    Blood work reviewed by me is equivocal.  Slightly elevated BNP is nonspecific __________________________________________  EKG  ED ECG REPORT I, Darel Hong, the attending physician, personally viewed and interpreted this ECG.  Date: 02/11/2017 EKG Time:  Rate: 73 Rhythm: normal sinus rhythm QRS Axis: normal Intervals: normal ST/T Wave abnormalities: normal Narrative  Interpretation: no evidence of acute ischemia  ____________________________________________  RADIOLOGY  X-ray reviewed by me shows minor interstitial edema ____________________________________________   PROCEDURES  Procedure(s) performed: no  Procedures  Critical Care performed: no  Observation: no ____________________________________________   INITIAL IMPRESSION / ASSESSMENT AND PLAN / ED COURSE  Pertinent labs & imaging results that were available during my care of the patient were reviewed by me and considered in my medical decision making (see chart for details).  On arrival the patient has no medical complaints and is quite well-appearing.  She does have obvious dementia and some crackles bilaterally.  Unclear if this is infectious versus fluid overload.  X-ray EKG and labs are pending.     ----------------------------------------- 2:17 PM on 02/11/2017 -----------------------------------------  The patient's chest x-ray shows interstitial edema.  She does have normal ejection fraction recently and in the setting of increased shortness of breath and cough this may very well represent pneumonia.  Unfortunately she is allergic to penicillin so I cannot treat her with azithromycin and amoxicillin as I normally would.  Instead I will give her a 7-day course of Levaquin with the first tablet now. ____________________________________________   FINAL CLINICAL IMPRESSION(S) / ED DIAGNOSES  Final diagnoses:  Community acquired pneumonia, unspecified laterality      NEW MEDICATIONS STARTED DURING THIS VISIT:  This SmartLink is deprecated. Use AVSMEDLIST instead to display the medication list for a patient.   Copeland:  This document was prepared using Dragon voice recognition software and may include unintentional dictation errors.     Darel Hong, MD 02/12/17 1445

## 2017-02-11 NOTE — Discharge Instructions (Signed)
Please seek medical attention for any high fevers, chest pain, shortness of breath, change in behavior, persistent vomiting, bloody stool or any other new or concerning symptoms.  

## 2017-02-11 NOTE — ED Notes (Signed)
Called [facility] for transport back to [facility], [place]

## 2017-02-11 NOTE — ED Notes (Signed)
Pt walked to and from commode.  Upon getting back into bed, pt's O2 sat down to 86% on RA.  Pt placed on [facility] and Dr. Mable Paris notified.

## 2017-02-11 NOTE — ED Notes (Signed)
Pt becoming increasingly agitated. Pt taking clothes off and walking out of room. This RN and Georgina Peer, RN tried to verbally redirect patient. Pt grabbing nurses arm, attempting to get out of bed. Dr. Mable Paris informed and order for versed 2 mg placed.

## 2017-02-11 NOTE — ED Notes (Signed)
Per EDP, okay for patient to discharge back to facility at this time.

## 2017-02-11 NOTE — ED Notes (Signed)
Dr. Mable Paris at bedside with patient.

## 2017-02-11 NOTE — Discharge Instructions (Signed)
Please take all of your antibiotics as prescribed and use your inhaler as.  Make sure you follow-up with your primary care physician this coming Monday for a recheck.  Return to the ED sooner for any concerns whatsoever.  It was a pleasure to take care of you today, and thank you for coming to our emergency department.  If you have any questions or concerns before leaving please ask the nurse to grab me and I'm more than happy to go through your aftercare instructions again.  If you were prescribed any opioid pain medication today such as Norco, Vicodin, Percocet, morphine, hydrocodone, or oxycodone please make sure you do not drive when you are taking this medication as it can alter your ability to drive safely.  If you have any concerns once you are home that you are not improving or are in fact getting worse before you can make it to your follow-up appointment, please do not hesitate to call 911 and come back for further evaluation.  Darel Hong, MD  Results for orders placed or performed during the hospital encounter of 02/11/17  Comprehensive metabolic panel  Result Value Ref Range   Sodium 140 135 - 145 mmol/L   Potassium 3.9 3.5 - 5.1 mmol/L   Chloride 102 101 - 111 mmol/L   CO2 29 22 - 32 mmol/L   Glucose, Bld 198 (H) 65 - 99 mg/dL   BUN 20 6 - 20 mg/dL   Creatinine, Ser 1.09 (H) 0.44 - 1.00 mg/dL   Calcium 9.1 8.9 - 10.3 mg/dL   Total Protein 7.9 6.5 - 8.1 g/dL   Albumin 3.4 (L) 3.5 - 5.0 g/dL   AST 26 15 - 41 U/L   ALT 13 (L) 14 - 54 U/L   Alkaline Phosphatase 70 38 - 126 U/L   Total Bilirubin 0.6 0.3 - 1.2 mg/dL   GFR calc non Af Amer 46 (L) >60 mL/min   GFR calc Af Amer 53 (L) >60 mL/min   Anion gap 9 5 - 15  Troponin I  Result Value Ref Range   Troponin I 0.03 (HH) <0.03 ng/mL  CBC with Differential  Result Value Ref Range   WBC 7.6 3.6 - 11.0 K/uL   RBC 4.50 3.80 - 5.20 MIL/uL   Hemoglobin 13.2 12.0 - 16.0 g/dL   HCT 40.1 35.0 - 47.0 %   MCV 89.1 80.0 - 100.0  fL   MCH 29.3 26.0 - 34.0 pg   MCHC 32.9 32.0 - 36.0 g/dL   RDW 13.3 11.5 - 14.5 %   Platelets 149 (L) 150 - 440 K/uL   Neutrophils Relative % 73 %   Neutro Abs 5.6 1.4 - 6.5 K/uL   Lymphocytes Relative 17 %   Lymphs Abs 1.3 1.0 - 3.6 K/uL   Monocytes Relative 8 %   Monocytes Absolute 0.6 0.2 - 0.9 K/uL   Eosinophils Relative 2 %   Eosinophils Absolute 0.1 0 - 0.7 K/uL   Basophils Relative 0 %   Basophils Absolute 0.0 0 - 0.1 K/uL   Dg Chest 2 View  Result Date: 02/11/2017 CLINICAL DATA:  83 year old presenting with acute onset of shortness of breath and increased work of breathing. EXAM: CHEST  2 VIEW COMPARISON:  CT chest 10/11/2016 and earlier. Chest x-rays 10/02/2016, 12/02/2015 and earlier. FINDINGS: Cardiac silhouette mildly enlarged, unchanged. Left subclavian dual lead transvenous pacemaker with the lead tips at the expected location of the right atrial appendage and right ventricular apex, unchanged. Thoracic aorta  tortuous and atherosclerotic, unchanged. Hilar and mediastinal contours otherwise unremarkable. Chronic interstitial lung disease, with slight increase in interstitial opacity when compared to the examinations in 2017 and 2016. Pleuroparenchymal scarring at the bases which accounts for the blunted costophrenic angles, unchanged. Parenchymal scarring in the inferior right upper lobe and in the left lower lobe at the site of prior pneumonia. No new pulmonary parenchymal abnormalities. No visible pleural effusions. Mild degenerative changes involving the thoracic spine. Osseous demineralization. IMPRESSION: Mild interstitial pulmonary edema is suspected, superimposed upon chronic interstitial lung disease. Electronically Signed   By: Evangeline Dakin M.D.   On: 02/11/2017 14:06

## 2017-02-12 NOTE — ED Provider Notes (Signed)
[facility] Emergency Department Provider Note  ____________________________________________   I have reviewed the triage vital signs and the nursing notes.   HISTORY  Chief Complaint Shortness of Breath   History limited by and level 5 caveat due to: Dementia   HPI Jill Copeland is a 83 y.o. female who presents to the emergency department today because of concern for continued shortness of breath.  DURATION:started today TIMING: intermittent CONTEXT: patient with history of dementia, comes from living facility. Was seen in the emergency department a few hours earlier for shortness of breath. Diagnosed with pneumonia. Was discharged with antibiotics. Once back at living facility she complained of shortness of breath again and EMS was again called. MODIFYING FACTORS: none identified ASSOCIATED SYMPTOMS: no fevers  Per medical record review patient has a history of ED visit from a few hours ago, lab work and cxr done at that time.  Past Medical History:  Diagnosis Date  . Alzheimer disease   . Arthritis   . Bacteremia    a. 09/2016 - admit w/ S hominis bacteremia w/ ? of pacer lead vegetation;  b. 09/2016 TEE:  unable to fully pass TEE probe due to esoph stricture; c. IV Abx per ID.  . Diabetes mellitus without complication ([place])   . Dysphagia causing pulmonary aspiration with swallowing   . Esophageal stricture    a. @ site of anastamosis from hiatal hernia surgery.  Marland Kitchen GERD (gastroesophageal reflux disease)   . Hiatal hernia    a. s/p prior repair with fistula;  b. 09/2016 CT chest: moderate to large hiatal hernia - stable.  . Hypothyroidism   . Pneumococcal pneumonia ([place])   . Recurrent pneumonia   . Recurrent UTI   . Schizo affective schizophrenia ([place])   . Stroke (cerebrum) ([place])   . Symptomatic bradycardia    a. s/p biotronik PPM 02/2005 at [facility] in [place], [place]  . Syncope    a. 02/2005 s/p Biotronik DC PPM @ [facility]  in [place], [place].    Patient Active Problem List   Diagnosis Date Noted  . Advanced care planning/counseling discussion 11/19/2016  . Impaired mobility and ADLs 11/19/2016  . Adventitious breath sounds 11/19/2016  . Pacemaker infection ([place]) 10/11/2016  . Endocarditis 10/09/2016  . Schizoaffective disorder ([place])   . Pressure ulcer 01/13/2015  . Multiple falls 01/12/2015  . Overactive bladder 12/30/2014  . Alzheimer disease   . Schizo affective schizophrenia ([place])   . Recurrent pneumonia   . Dysphagia causing pulmonary aspiration with swallowing   . Pneumococcal pneumonia ([place])   . Arrhythmia   . Esophageal stricture   . Hypothyroidism   . Lung nodule   . Dementia in chronic schizophrenia ([place]) 08/12/2014  . Social discord 08/12/2014  . Family conflict 38/12/1749    Past Surgical History:  Procedure Laterality Date  . HERNIA REPAIR     hiatal  . JEJUNOSTOMY FEEDING TUBE    . PACEMAKER INSERTION    . TEE WITHOUT CARDIOVERSION N/A 10/11/2016   Procedure: TRANSESOPHAGEAL ECHOCARDIOGRAM (TEE);  Surgeon: Wellington Hampshire, MD;  Location: [facility] ORS;  Service: Cardiovascular;  Laterality: N/A;    Prior to Admission medications   Medication Sig Start Date End Date Taking? Authorizing Provider  acetaminophen (TYLENOL) 500 MG tablet Take 500 mg by mouth every 4 (four) hours as needed.    [provider]  albuterol (PROVENTIL HFA;VENTOLIN HFA) 108 (90 Base) MCG/ACT inhaler Inhale 2 puffs into the lungs every 6 (six) hours  as needed for wheezing or shortness of breath. 02/11/17   Darel Hong, MD  Cholecalciferol 2000 units TABS Take 2,000 Units by mouth daily.    [provider]  clonazePAM (KLONOPIN) 0.5 MG tablet Take 0.25 mg by mouth at bedtime.    [provider]  divalproex (DEPAKOTE ER) 500 MG 24 hr tablet Take 1 tablet (500 mg total) by mouth at bedtime. Patient taking differently: Take 250-500 mg by mouth daily. TAKE 2 TABLETS BY MOUTH DAILY FOR 5  DAYS THEN TAKE 1 TABLET BY MOUTH DAILY (BEGINNING 02/13/2017) 11/19/16   Kathrine Haddock, NP  docusate sodium (COLACE) 100 MG capsule Take 100 mg by mouth 2 (two) times daily.    [provider]  ferrous sulfate 325 (65 FE) MG tablet Take 325 mg by mouth daily with breakfast.    [provider]  fluticasone (FLONASE) 50 MCG/ACT nasal spray Place 2 sprays into both nostrils daily.    [provider]  furosemide (LASIX) 20 MG tablet Take 20 mg by mouth every other day.    [provider]  gabapentin (NEURONTIN) 100 MG capsule Take 2 capsules (200 mg total) by mouth 3 (three) times daily. 11/19/16   Kathrine Haddock, NP  levofloxacin (LEVAQUIN) 500 MG tablet Take 1 tablet (500 mg total) by mouth daily for 10 days. 02/11/17 02/21/17  Darel Hong, MD  levothyroxine (SYNTHROID, LEVOTHROID) 125 MCG tablet Take 125 mcg by mouth daily before breakfast.     [provider]  loperamide (IMODIUM) 2 MG capsule Take 2 mg by mouth as needed for diarrhea or loose stools.    [provider]  loratadine (CLARITIN) 10 MG tablet Take 10 mg by mouth daily.    [provider]  Melatonin 3 MG TABS Take 3 mg by mouth at bedtime.    [provider]  memantine (NAMENDA) 5 MG tablet Take 5 mg by mouth 2 (two) times daily.    [provider]  metoprolol tartrate (LOPRESSOR) 25 MG tablet Take 12.5 mg by mouth 2 (two) times daily.    [provider]  OLANZapine (ZYPREXA) 10 MG tablet Take 10 mg by mouth at bedtime.    [provider]  ondansetron (ZOFRAN) 8 MG tablet Take 8 mg by mouth every 8 (eight) hours as needed for nausea or vomiting.    [provider]  potassium chloride (K-DUR,KLOR-CON) 10 MEQ tablet Take 10 mEq by mouth every other day.    [provider]  saccharomyces boulardii (FLORASTOR) 250 MG capsule Take 1 capsule (250 mg total) by mouth 2 (two) times daily. 01/01/15   Hosie Poisson, MD  senna  (SENOKOT) 8.6 MG TABS tablet Take 1 tablet by mouth 2 (two) times a week. Mondays and Fridays    [provider]  sertraline (ZOLOFT) 50 MG tablet Take 1 tablet (50 mg total) by mouth at bedtime. Patient not taking: Reported on 02/11/2017 11/19/16   Kathrine Haddock, NP  sodium chloride 1 G tablet Take 1 g by mouth 3 (three) times daily.    [provider]  traMADol (ULTRAM) 50 MG tablet Take 1 tablet (50 mg total) by mouth 2 (two) times daily as needed for moderate pain. 10/13/16   Hillary Bow, MD    Allergies Penicillins  Family History  Problem Relation Age of Onset  . Thyroid cancer Grandchild   . Thyroid nodules Daughter   . Thyroid cancer Daughter   . Graves' disease Daughter   . Dementia Mother   .  Schizophrenia Maternal Aunt   . Thyroid disease Son   . Cancer Son   . Cancer Brother   . Cancer Brother     Social History Social History   Tobacco Use  . Smoking status: Never Smoker  . Smokeless tobacco: Never Used  Substance Use Topics  . Alcohol use: No    Alcohol/week: 0.0 oz  . Drug use: No    Review of Systems Level 5 exemption due to inability to obtain secondary to dementia.   ____________________________________________   PHYSICAL EXAM:  VITAL SIGNS: ED Triage Vitals  Enc Vitals Group     BP 02/11/17 2010 (!) 159/76     Pulse Rate 02/11/17 2010 86     Resp 02/11/17 2010 16     Temp 02/11/17 2010 98 F (36.7 C)     Temp Source 02/11/17 2010 Oral     SpO2 02/11/17 2006 97 %     Weight 02/11/17 2010 128 lb (58.1 kg)     Height 02/11/17 2010 5\' 4"  (1.626 m)     Head Circumference --      Peak Flow --      Pain Score 02/11/17 2010 0   Constitutional: Awake and alert. No acute distress.  Eyes: Conjunctivae are normal.  ENT   Head: Normocephalic and atraumatic.   Nose: No congestion/rhinnorhea.   Mouth/Throat: Mucous membranes are moist.   Neck: No stridor. Hematological/Lymphatic/Immunilogical: No cervical  lymphadenopathy. Cardiovascular: Normal rate, regular rhythm.  No murmurs, rubs, or gallops.  Respiratory: Normal respiratory effort without tachypnea nor retractions. Breath sounds are clear and equal bilaterally. No wheezes/rales/rhonchi. Gastrointestinal: Soft and non tender. No rebound. No guarding.  Genitourinary: Deferred Musculoskeletal: Normal range of motion in all extremities. No lower extremity edema. Neurologic:  Normal speech and language. No gross focal neurologic deficits are appreciated.  Skin:  Skin is warm, dry and intact. No rash noted. Psychiatric: Mood and affect are normal. Speech and behavior are normal. Patient exhibits appropriate insight and judgment.  ____________________________________________    LABS (pertinent positives/negatives)  None  ____________________________________________   EKG  None  ____________________________________________    RADIOLOGY  None  ____________________________________________   PROCEDURES  Procedures  ____________________________________________   INITIAL IMPRESSION / ASSESSMENT AND PLAN / ED COURSE  Pertinent labs & imaging results that were available during my care of the patient were reviewed by me and considered in my medical decision making (see chart for details).  Patient sent back to the emergency department because of continued concern for SOB. On exam however patient in no respiratory distress. Lung sounds clear. She was prescribed antibiotics by previous doctor. Discussed with nursing home staff that she might continue to complain of some shortness of breath until the infection clears up. Will plan on discharging back to facility.  ____________________________________________   FINAL CLINICAL IMPRESSION(S) / ED DIAGNOSES  Final diagnoses:  SOB (shortness of breath)     Note: This dictation was prepared with Dragon dictation. Any transcriptional errors that result from this process are  unintentional     Nance Pear, MD 02/12/17 1730

## 2017-02-14 DIAGNOSIS — R1310 Dysphagia, unspecified: Secondary | ICD-10-CM | POA: Diagnosis not present

## 2017-02-14 DIAGNOSIS — Z87440 Personal history of urinary (tract) infections: Secondary | ICD-10-CM | POA: Diagnosis not present

## 2017-02-14 DIAGNOSIS — E119 Type 2 diabetes mellitus without complications: Secondary | ICD-10-CM | POA: Diagnosis not present

## 2017-02-14 DIAGNOSIS — G309 Alzheimer's disease, unspecified: Secondary | ICD-10-CM | POA: Diagnosis not present

## 2017-02-14 DIAGNOSIS — M1991 Primary osteoarthritis, unspecified site: Secondary | ICD-10-CM | POA: Diagnosis not present

## 2017-02-15 ENCOUNTER — Encounter: Payer: Medicare Other | Admitting: Internal Medicine

## 2017-02-15 DIAGNOSIS — E7849 Other hyperlipidemia: Secondary | ICD-10-CM | POA: Diagnosis not present

## 2017-02-15 DIAGNOSIS — E559 Vitamin D deficiency, unspecified: Secondary | ICD-10-CM | POA: Diagnosis not present

## 2017-02-15 DIAGNOSIS — E119 Type 2 diabetes mellitus without complications: Secondary | ICD-10-CM | POA: Diagnosis not present

## 2017-02-15 DIAGNOSIS — D518 Other vitamin B12 deficiency anemias: Secondary | ICD-10-CM | POA: Diagnosis not present

## 2017-02-15 DIAGNOSIS — Z7689 Persons encountering health services in other specified circumstances: Secondary | ICD-10-CM | POA: Diagnosis not present

## 2017-02-15 DIAGNOSIS — Z79899 Other long term (current) drug therapy: Secondary | ICD-10-CM | POA: Diagnosis not present

## 2017-02-16 DIAGNOSIS — E119 Type 2 diabetes mellitus without complications: Secondary | ICD-10-CM | POA: Diagnosis not present

## 2017-02-16 DIAGNOSIS — M1991 Primary osteoarthritis, unspecified site: Secondary | ICD-10-CM | POA: Diagnosis not present

## 2017-02-16 DIAGNOSIS — G309 Alzheimer's disease, unspecified: Secondary | ICD-10-CM | POA: Diagnosis not present

## 2017-02-16 DIAGNOSIS — R1310 Dysphagia, unspecified: Secondary | ICD-10-CM | POA: Diagnosis not present

## 2017-02-16 DIAGNOSIS — Z87440 Personal history of urinary (tract) infections: Secondary | ICD-10-CM | POA: Diagnosis not present

## 2017-02-18 ENCOUNTER — Ambulatory Visit: Payer: Self-pay | Admitting: Unknown Physician Specialty

## 2017-02-21 DIAGNOSIS — R1310 Dysphagia, unspecified: Secondary | ICD-10-CM | POA: Diagnosis not present

## 2017-02-21 DIAGNOSIS — R2689 Other abnormalities of gait and mobility: Secondary | ICD-10-CM | POA: Diagnosis not present

## 2017-02-21 DIAGNOSIS — Z9181 History of falling: Secondary | ICD-10-CM | POA: Diagnosis not present

## 2017-02-21 DIAGNOSIS — E119 Type 2 diabetes mellitus without complications: Secondary | ICD-10-CM | POA: Diagnosis not present

## 2017-02-21 DIAGNOSIS — M1991 Primary osteoarthritis, unspecified site: Secondary | ICD-10-CM | POA: Diagnosis not present

## 2017-02-21 DIAGNOSIS — G309 Alzheimer's disease, unspecified: Secondary | ICD-10-CM | POA: Diagnosis not present

## 2017-02-23 DIAGNOSIS — G309 Alzheimer's disease, unspecified: Secondary | ICD-10-CM | POA: Diagnosis not present

## 2017-02-23 DIAGNOSIS — Z9181 History of falling: Secondary | ICD-10-CM | POA: Diagnosis not present

## 2017-02-23 DIAGNOSIS — M1991 Primary osteoarthritis, unspecified site: Secondary | ICD-10-CM | POA: Diagnosis not present

## 2017-02-23 DIAGNOSIS — E119 Type 2 diabetes mellitus without complications: Secondary | ICD-10-CM | POA: Diagnosis not present

## 2017-02-23 DIAGNOSIS — R1310 Dysphagia, unspecified: Secondary | ICD-10-CM | POA: Diagnosis not present

## 2017-02-23 DIAGNOSIS — R2689 Other abnormalities of gait and mobility: Secondary | ICD-10-CM | POA: Diagnosis not present

## 2017-03-01 ENCOUNTER — Emergency Department
Admission: EM | Admit: 2017-03-01 | Discharge: 2017-03-01 | Disposition: A | Discharge status: Home / Self Care | Payer: Medicare Other | Attending: Student in an Organized Health Care Education/Training Program | Admitting: Student in an Organized Health Care Education/Training Program

## 2017-03-01 ENCOUNTER — Encounter: Payer: Self-pay | Admitting: Emergency Medicine

## 2017-03-01 ENCOUNTER — Emergency Department: Payer: Medicare Other

## 2017-03-01 DIAGNOSIS — E119 Type 2 diabetes mellitus without complications: Secondary | ICD-10-CM | POA: Insufficient documentation

## 2017-03-01 DIAGNOSIS — R059 Cough, unspecified: Secondary | ICD-10-CM

## 2017-03-01 DIAGNOSIS — Z79899 Other long term (current) drug therapy: Secondary | ICD-10-CM | POA: Diagnosis not present

## 2017-03-01 DIAGNOSIS — R05 Cough: Secondary | ICD-10-CM | POA: Insufficient documentation

## 2017-03-01 DIAGNOSIS — E039 Hypothyroidism, unspecified: Secondary | ICD-10-CM | POA: Insufficient documentation

## 2017-03-01 DIAGNOSIS — F0280 Dementia in other diseases classified elsewhere without behavioral disturbance: Secondary | ICD-10-CM | POA: Insufficient documentation

## 2017-03-01 DIAGNOSIS — G309 Alzheimer's disease, unspecified: Secondary | ICD-10-CM | POA: Insufficient documentation

## 2017-03-01 DIAGNOSIS — Z950 Presence of cardiac pacemaker: Secondary | ICD-10-CM | POA: Diagnosis not present

## 2017-03-01 LAB — CBC WITH DIFFERENTIAL/PLATELET
Basophils Absolute: 0 10*3/uL (ref 0–0.1)
Basophils Relative: 1 %
EOS ABS: 0.2 10*3/uL (ref 0–0.7)
Eosinophils Relative: 3 %
HEMATOCRIT: 42 % (ref 35.0–47.0)
HEMOGLOBIN: 13.9 g/dL (ref 12.0–16.0)
LYMPHS ABS: 1.5 10*3/uL (ref 1.0–3.6)
Lymphocytes Relative: 22 %
MCH: 29.3 pg (ref 26.0–34.0)
MCHC: 33 g/dL (ref 32.0–36.0)
MCV: 88.8 fL (ref 80.0–100.0)
MONOS PCT: 6 %
Monocytes Absolute: 0.4 10*3/uL (ref 0.2–0.9)
NEUTROS PCT: 68 %
Neutro Abs: 4.9 10*3/uL (ref 1.4–6.5)
Platelets: 188 10*3/uL (ref 150–440)
RBC: 4.73 MIL/uL (ref 3.80–5.20)
RDW: 14.3 % (ref 11.5–14.5)
WBC: 7.1 10*3/uL (ref 3.6–11.0)

## 2017-03-01 LAB — BASIC METABOLIC PANEL
Anion gap: 9 (ref 5–15)
BUN: 22 mg/dL — AB (ref 6–20)
CHLORIDE: 101 mmol/L (ref 101–111)
CO2: 29 mmol/L (ref 22–32)
CREATININE: 1.17 mg/dL — AB (ref 0.44–1.00)
Calcium: 9.4 mg/dL (ref 8.9–10.3)
GFR calc Af Amer: 49 mL/min — ABNORMAL LOW (ref >60)
GFR calc non Af Amer: 42 mL/min — ABNORMAL LOW (ref >60)
GLUCOSE: 102 mg/dL — AB (ref 65–99)
Potassium: 4.2 mmol/L (ref 3.5–5.1)
SODIUM: 139 mmol/L (ref 135–145)

## 2017-03-01 MED ORDER — AMLODIPINE BESYLATE 5 MG PO TABS
5.0000 mg | ORAL_TABLET | Freq: Once | ORAL | Status: AC
  Administered 2017-03-01: 5 mg via ORAL
  Filled 2017-03-01: qty 1

## 2017-03-01 MED ORDER — ALBUTEROL SULFATE HFA 108 (90 BASE) MCG/ACT IN AERS: 2.0000 | INHALATION_SPRAY | Freq: Four times a day (QID) | RESPIRATORY_TRACT | 2 refills | Status: DC | PRN

## 2017-03-01 MED ORDER — IPRATROPIUM-ALBUTEROL 0.5-2.5 (3) MG/3ML IN SOLN
3.0000 mL | Freq: Once | RESPIRATORY_TRACT | Status: AC
  Administered 2017-03-01: 3 mL via RESPIRATORY_TRACT
  Filled 2017-03-01: qty 3

## 2017-03-01 NOTE — ED Provider Notes (Signed)
[facility] Emergency Department Provider Note    First MD Initiated Contact with Patient 03/01/17 1723     (approximate)  I have reviewed the triage vital signs and the nursing notes.   HISTORY  Chief Complaint Cough    HPI Jill Copeland is a 83 y.o. female with advanced dementia with previous hospitalizations for respiratory failure with pneumonia presents after choking and coughing spell that occurred while she was at a restaurant today.  States that she was eating lunch and then had coughing and wheezing episode that lasted roughly 15-20 minutes that spontaneously resolved.  Patient is here with her daughter due to her underlying dementia.  Daughter states that given her history of lung disease they brought her to the ER due to the worsening cough and wheezing to be "checked out ".  Patient denies any pain.  No shortness of breath at this time.  Daughter states that she frequently chokes on food and has had aspiration pneumonia in the past.  Past Medical History:  Diagnosis Date  . Alzheimer disease   . Arthritis   . Bacteremia    a. 09/2016 - admit w/ S hominis bacteremia w/ ? of pacer lead vegetation;  b. 09/2016 TEE:  unable to fully pass TEE probe due to esoph stricture; c. IV Abx per ID.  . Diabetes mellitus without complication ([place])   . Dysphagia causing pulmonary aspiration with swallowing   . Esophageal stricture    a. @ site of anastamosis from hiatal hernia surgery.  Marland Kitchen GERD (gastroesophageal reflux disease)   . Hiatal hernia    a. s/p prior repair with fistula;  b. 09/2016 CT chest: moderate to large hiatal hernia - stable.  . Hypothyroidism   . Pneumococcal pneumonia ([place])   . Recurrent pneumonia   . Recurrent UTI   . Schizo affective schizophrenia ([place])   . Stroke (cerebrum) ([place])   . Symptomatic bradycardia    a. s/p biotronik PPM 02/2005 at [facility] in [place], [place]  . Syncope    a. 02/2005 s/p Biotronik DC PPM @  [facility] in [place], [place].   Family History  Problem Relation Age of Onset  . Thyroid cancer Grandchild   . Thyroid nodules Daughter   . Thyroid cancer Daughter   . Graves' disease Daughter   . Dementia Mother   . Schizophrenia Maternal Aunt   . Thyroid disease Son   . Cancer Son   . Cancer Brother   . Cancer Brother    Past Surgical History:  Procedure Laterality Date  . HERNIA REPAIR     hiatal  . JEJUNOSTOMY FEEDING TUBE    . PACEMAKER INSERTION    . TEE WITHOUT CARDIOVERSION N/A 10/11/2016   Procedure: TRANSESOPHAGEAL ECHOCARDIOGRAM (TEE);  Surgeon: Wellington Hampshire, MD;  Location: [facility] ORS;  Service: Cardiovascular;  Laterality: N/A;   Patient Active Problem List   Diagnosis Date Noted  . Advanced care planning/counseling discussion 11/19/2016  . Impaired mobility and ADLs 11/19/2016  . Adventitious breath sounds 11/19/2016  . Pacemaker infection ([place]) 10/11/2016  . Endocarditis 10/09/2016  . Schizoaffective disorder ([place])   . Pressure ulcer 01/13/2015  . Multiple falls 01/12/2015  . Overactive bladder 12/30/2014  . Alzheimer disease   . Schizo affective schizophrenia ([place])   . Recurrent pneumonia   . Dysphagia causing pulmonary aspiration with swallowing   . Pneumococcal pneumonia ([place])   . Arrhythmia   . Esophageal stricture   . Hypothyroidism   .  Lung nodule   . Dementia in chronic schizophrenia ([place]) 08/12/2014  . Social discord 08/12/2014  . Family conflict 23/53/6144      Prior to Admission medications   Medication Sig Start Date End Date Taking? Authorizing Provider  acetaminophen (TYLENOL) 500 MG tablet Take 500 mg by mouth every 4 (four) hours as needed.    [provider]  albuterol (PROVENTIL HFA;VENTOLIN HFA) 108 (90 Base) MCG/ACT inhaler Inhale 2 puffs into the lungs every 6 (six) hours as needed for wheezing or shortness of breath. 02/11/17   Darel Hong, MD  Cholecalciferol 2000 units TABS Take 2,000 Units by mouth daily.     [provider]  clonazePAM (KLONOPIN) 0.5 MG tablet Take 0.25 mg by mouth at bedtime.    [provider]  divalproex (DEPAKOTE ER) 500 MG 24 hr tablet Take 1 tablet (500 mg total) by mouth at bedtime. Patient taking differently: Take 250-500 mg by mouth daily. TAKE 2 TABLETS BY MOUTH DAILY FOR 5 DAYS THEN TAKE 1 TABLET BY MOUTH DAILY (BEGINNING 02/13/2017) 11/19/16   Kathrine Haddock, NP  docusate sodium (COLACE) 100 MG capsule Take 100 mg by mouth 2 (two) times daily.    [provider]  ferrous sulfate 325 (65 FE) MG tablet Take 325 mg by mouth daily with breakfast.    [provider]  fluticasone (FLONASE) 50 MCG/ACT nasal spray Place 2 sprays into both nostrils daily.    [provider]  furosemide (LASIX) 20 MG tablet Take 20 mg by mouth every other day.    [provider]  gabapentin (NEURONTIN) 100 MG capsule Take 2 capsules (200 mg total) by mouth 3 (three) times daily. 11/19/16   Kathrine Haddock, NP  levothyroxine (SYNTHROID, LEVOTHROID) 125 MCG tablet Take 125 mcg by mouth daily before breakfast.     [provider]  loperamide (IMODIUM) 2 MG capsule Take 2 mg by mouth as needed for diarrhea or loose stools.    [provider]  loratadine (CLARITIN) 10 MG tablet Take 10 mg by mouth daily.    [provider]  Melatonin 3 MG TABS Take 3 mg by mouth at bedtime.    [provider]  memantine (NAMENDA) 5 MG tablet Take 5 mg by mouth 2 (two) times daily.    [provider]  metoprolol tartrate (LOPRESSOR) 25 MG tablet Take 12.5 mg by mouth 2 (two) times daily.    [provider]  OLANZapine (ZYPREXA) 10 MG tablet Take 10 mg by mouth at bedtime.    [provider]  ondansetron (ZOFRAN) 8 MG tablet Take 8 mg by mouth every 8 (eight) hours as needed for nausea or vomiting.    [provider]  potassium chloride (K-DUR,KLOR-CON) 10 MEQ tablet Take 10 mEq by mouth every other  day.    [provider]  saccharomyces boulardii (FLORASTOR) 250 MG capsule Take 1 capsule (250 mg total) by mouth 2 (two) times daily. 01/01/15   Hosie Poisson, MD  senna (SENOKOT) 8.6 MG TABS tablet Take 1 tablet by mouth 2 (two) times a week. Mondays and Fridays    [provider]  sertraline (ZOLOFT) 50 MG tablet Take 1 tablet (50 mg total) by mouth at bedtime. Patient not taking: Reported on 02/11/2017 11/19/16   Kathrine Haddock, NP  sodium chloride 1 G tablet Take 1 g by mouth 3 (three) times daily.    [provider]  traMADol (ULTRAM) 50 MG tablet Take 1 tablet (50 mg total)  by mouth 2 (two) times daily as needed for moderate pain. 10/13/16   Hillary Bow, MD    Allergies Penicillins    Social History Social History   Tobacco Use  . Smoking status: Never Smoker  . Smokeless tobacco: Never Used  Substance Use Topics  . Alcohol use: No    Alcohol/week: 0.0 oz  . Drug use: No    Review of Systems Patient denies headaches, rhinorrhea, blurry vision, numbness, shortness of breath, chest pain, edema, cough, abdominal pain, nausea, vomiting, diarrhea, dysuria, fevers, rashes or hallucinations unless otherwise stated above in HPI. ____________________________________________   PHYSICAL EXAM:  VITAL SIGNS: Vitals:   03/01/17 1633 03/01/17 1720  BP: (!) 196/97 (!) 193/95  Pulse: 90 80  Resp: 16 18  Temp: 98.2 F (36.8 C)   SpO2: 93% 93%    Constitutional: Alert and in no acute distress. Eyes: Conjunctivae are normal.  Head: Atraumatic. Nose: No congestion/rhinnorhea. Mouth/Throat: Mucous membranes are moist.   Neck: Painless ROM.  Cardiovascular:   Good peripheral circulation. Respiratory: Normal respiratory effort.  No retractions.  Bilateral crackles and occasional wheeze.  Good air movement bilaterally.  Gastrointestinal: Soft and nontender.  Musculoskeletal: No lower extremity tenderness .  No joint effusions. Neurologic:  Normal speech  and language. No gross focal neurologic deficits are appreciated.  Skin:  Skin is warm, dry and intact. No rash noted. Psychiatric: Mood and affect are normal. ____________________________________________   LABS (all labs ordered are listed, but only abnormal results are displayed)  Results for orders placed or performed during the hospital encounter of 03/01/17 (from the past 24 hour(s))  CBC with Differential/Platelet     Status: None   Collection Time: 03/01/17  5:39 PM  Result Value Ref Range   WBC 7.1 3.6 - 11.0 K/uL   RBC 4.73 3.80 - 5.20 MIL/uL   Hemoglobin 13.9 12.0 - 16.0 g/dL   HCT 42.0 35.0 - 47.0 %   MCV 88.8 80.0 - 100.0 fL   MCH 29.3 26.0 - 34.0 pg   MCHC 33.0 32.0 - 36.0 g/dL   RDW 14.3 11.5 - 14.5 %   Platelets 188 150 - 440 K/uL   Neutrophils Relative % 68 %   Neutro Abs 4.9 1.4 - 6.5 K/uL   Lymphocytes Relative 22 %   Lymphs Abs 1.5 1.0 - 3.6 K/uL   Monocytes Relative 6 %   Monocytes Absolute 0.4 0.2 - 0.9 K/uL   Eosinophils Relative 3 %   Eosinophils Absolute 0.2 0 - 0.7 K/uL   Basophils Relative 1 %   Basophils Absolute 0.0 0 - 0.1 K/uL  Basic metabolic panel     Status: Abnormal   Collection Time: 03/01/17  5:39 PM  Result Value Ref Range   Sodium 139 135 - 145 mmol/L   Potassium 4.2 3.5 - 5.1 mmol/L   Chloride 101 101 - 111 mmol/L   CO2 29 22 - 32 mmol/L   Glucose, Bld 102 (H) 65 - 99 mg/dL   BUN 22 (H) 6 - 20 mg/dL   Creatinine, Ser 1.17 (H) 0.44 - 1.00 mg/dL   Calcium 9.4 8.9 - 10.3 mg/dL   GFR calc non Af Amer 42 (L) >60 mL/min   GFR calc Af Amer 49 (L) >60 mL/min   Anion gap 9 5 - 15   ____________________________________________ ____________________________________________  RADIOLOGY  I personally reviewed all radiographic images ordered to evaluate for the above acute complaints and reviewed radiology reports and findings.  These findings were  personally discussed with the patient.  Please see medical record for radiology  report.  ____________________________________________   PROCEDURES  Procedure(s) performed:  Procedures    Critical Care performed: no ____________________________________________   INITIAL IMPRESSION / ASSESSMENT AND PLAN / ED COURSE  Pertinent labs & imaging results that were available during my care of the patient were reviewed by me and considered in my medical decision making (see chart for details).  DDX: pna, aspiration, pna, ptx, chf  Dannisha Eckmann is a 83 y.o. who presents to the ED with coughing spell that occurred as described above.  Patient is afebrile hemodynamically stable.  She is already requesting discharge home.  Most likely had some form of choking event that has since resolved.  Does have underlying asthma.  Chest x-ray shows no evidence of infiltrate or edema.  Blood work is reassuring.  Will discharge patient with albuterol inhaler and discussed strict return precautions.      ____________________________________________   FINAL CLINICAL IMPRESSION(S) / ED DIAGNOSES  Final diagnoses:  Cough      NEW MEDICATIONS STARTED DURING THIS VISIT:  This SmartLink is deprecated. Use AVSMEDLIST instead to display the medication list for a patient.   Note:  This document was prepared using Dragon voice recognition software and may include unintentional dictation errors.     Merlyn Lot, MD 03/01/17 [phone]

## 2017-03-01 NOTE — ED Notes (Signed)
Pt moved to room 6 from flex.  Pt waiting to see er md.  Pt alert.  Family with pt.   nsr on monitor.

## 2017-03-01 NOTE — ED Notes (Signed)
report called to Amy C   Moved to room 6 via stretcher  Family at bedside

## 2017-03-01 NOTE — ED Triage Notes (Signed)
Patient presents to the ED for cough and wheezing.  Patient's daughter brought her.  Daughter states patient was with her on an outing today and she was coughing and wheezing.  Patient was treated for pneumonia about 2 weeks ago per daughter.  Daughter states patient get pneumonia often.  Patient is somewhat agitated during triage, stating, "I would like to go home, can I go home now?"

## 2017-03-02 DIAGNOSIS — R2689 Other abnormalities of gait and mobility: Secondary | ICD-10-CM | POA: Diagnosis not present

## 2017-03-02 DIAGNOSIS — G309 Alzheimer's disease, unspecified: Secondary | ICD-10-CM | POA: Diagnosis not present

## 2017-03-02 DIAGNOSIS — E119 Type 2 diabetes mellitus without complications: Secondary | ICD-10-CM | POA: Diagnosis not present

## 2017-03-02 DIAGNOSIS — Z9181 History of falling: Secondary | ICD-10-CM | POA: Diagnosis not present

## 2017-03-02 DIAGNOSIS — R1310 Dysphagia, unspecified: Secondary | ICD-10-CM | POA: Diagnosis not present

## 2017-03-02 DIAGNOSIS — M1991 Primary osteoarthritis, unspecified site: Secondary | ICD-10-CM | POA: Diagnosis not present

## 2017-03-03 DIAGNOSIS — Z79899 Other long term (current) drug therapy: Secondary | ICD-10-CM | POA: Diagnosis not present

## 2017-03-03 DIAGNOSIS — R2689 Other abnormalities of gait and mobility: Secondary | ICD-10-CM | POA: Diagnosis not present

## 2017-03-03 DIAGNOSIS — G309 Alzheimer's disease, unspecified: Secondary | ICD-10-CM | POA: Diagnosis not present

## 2017-03-03 DIAGNOSIS — M1991 Primary osteoarthritis, unspecified site: Secondary | ICD-10-CM | POA: Diagnosis not present

## 2017-03-03 DIAGNOSIS — E559 Vitamin D deficiency, unspecified: Secondary | ICD-10-CM | POA: Diagnosis not present

## 2017-03-03 DIAGNOSIS — D518 Other vitamin B12 deficiency anemias: Secondary | ICD-10-CM | POA: Diagnosis not present

## 2017-03-03 DIAGNOSIS — E038 Other specified hypothyroidism: Secondary | ICD-10-CM | POA: Diagnosis not present

## 2017-03-03 DIAGNOSIS — E7849 Other hyperlipidemia: Secondary | ICD-10-CM | POA: Diagnosis not present

## 2017-03-03 DIAGNOSIS — E119 Type 2 diabetes mellitus without complications: Secondary | ICD-10-CM | POA: Diagnosis not present

## 2017-03-03 DIAGNOSIS — Z9181 History of falling: Secondary | ICD-10-CM | POA: Diagnosis not present

## 2017-03-03 DIAGNOSIS — R1310 Dysphagia, unspecified: Secondary | ICD-10-CM | POA: Diagnosis not present

## 2017-03-08 DIAGNOSIS — R2689 Other abnormalities of gait and mobility: Secondary | ICD-10-CM | POA: Diagnosis not present

## 2017-03-08 DIAGNOSIS — Z9181 History of falling: Secondary | ICD-10-CM | POA: Diagnosis not present

## 2017-03-08 DIAGNOSIS — R1310 Dysphagia, unspecified: Secondary | ICD-10-CM | POA: Diagnosis not present

## 2017-03-08 DIAGNOSIS — E119 Type 2 diabetes mellitus without complications: Secondary | ICD-10-CM | POA: Diagnosis not present

## 2017-03-08 DIAGNOSIS — G309 Alzheimer's disease, unspecified: Secondary | ICD-10-CM | POA: Diagnosis not present

## 2017-03-08 DIAGNOSIS — M1991 Primary osteoarthritis, unspecified site: Secondary | ICD-10-CM | POA: Diagnosis not present

## 2017-03-09 DIAGNOSIS — G309 Alzheimer's disease, unspecified: Secondary | ICD-10-CM | POA: Diagnosis not present

## 2017-03-09 DIAGNOSIS — M1991 Primary osteoarthritis, unspecified site: Secondary | ICD-10-CM | POA: Diagnosis not present

## 2017-03-09 DIAGNOSIS — E119 Type 2 diabetes mellitus without complications: Secondary | ICD-10-CM | POA: Diagnosis not present

## 2017-03-09 DIAGNOSIS — R2689 Other abnormalities of gait and mobility: Secondary | ICD-10-CM | POA: Diagnosis not present

## 2017-03-09 DIAGNOSIS — R1310 Dysphagia, unspecified: Secondary | ICD-10-CM | POA: Diagnosis not present

## 2017-03-09 DIAGNOSIS — Z9181 History of falling: Secondary | ICD-10-CM | POA: Diagnosis not present

## 2017-03-10 ENCOUNTER — Emergency Department
Admission: EM | Admit: 2017-03-10 | Discharge: 2017-03-11 | Disposition: A | Discharge status: Home / Self Care | Payer: Medicare Other | Attending: Internal Medicine | Admitting: Internal Medicine

## 2017-03-10 ENCOUNTER — Other Ambulatory Visit: Payer: Self-pay

## 2017-03-10 ENCOUNTER — Encounter: Payer: Self-pay | Admitting: Emergency Medicine

## 2017-03-10 DIAGNOSIS — S61412A Laceration without foreign body of left hand, initial encounter: Secondary | ICD-10-CM

## 2017-03-10 DIAGNOSIS — E039 Hypothyroidism, unspecified: Secondary | ICD-10-CM | POA: Diagnosis not present

## 2017-03-10 DIAGNOSIS — Y92199 Unspecified place in other specified residential institution as the place of occurrence of the external cause: Secondary | ICD-10-CM | POA: Diagnosis not present

## 2017-03-10 DIAGNOSIS — Z950 Presence of cardiac pacemaker: Secondary | ICD-10-CM | POA: Insufficient documentation

## 2017-03-10 DIAGNOSIS — Y939 Activity, unspecified: Secondary | ICD-10-CM | POA: Insufficient documentation

## 2017-03-10 DIAGNOSIS — G309 Alzheimer's disease, unspecified: Secondary | ICD-10-CM | POA: Diagnosis not present

## 2017-03-10 DIAGNOSIS — E119 Type 2 diabetes mellitus without complications: Secondary | ICD-10-CM | POA: Insufficient documentation

## 2017-03-10 DIAGNOSIS — Y999 Unspecified external cause status: Secondary | ICD-10-CM | POA: Diagnosis not present

## 2017-03-10 DIAGNOSIS — F259 Schizoaffective disorder, unspecified: Secondary | ICD-10-CM | POA: Diagnosis not present

## 2017-03-10 DIAGNOSIS — F0280 Dementia in other diseases classified elsewhere without behavioral disturbance: Secondary | ICD-10-CM | POA: Insufficient documentation

## 2017-03-10 DIAGNOSIS — Z79899 Other long term (current) drug therapy: Secondary | ICD-10-CM | POA: Insufficient documentation

## 2017-03-10 DIAGNOSIS — Z022 Encounter for examination for admission to residential institution: Secondary | ICD-10-CM | POA: Diagnosis present

## 2017-03-10 LAB — URINALYSIS, COMPLETE (UACMP) WITH MICROSCOPIC
BILIRUBIN URINE: NEGATIVE
Glucose, UA: NEGATIVE mg/dL
HGB URINE DIPSTICK: NEGATIVE
Ketones, ur: NEGATIVE mg/dL
NITRITE: NEGATIVE
PH: 7 (ref 5.0–8.0)
Protein, ur: 30 mg/dL — AB
SPECIFIC GRAVITY, URINE: 1.021 (ref 1.005–1.030)

## 2017-03-10 LAB — COMPREHENSIVE METABOLIC PANEL
ALT: 20 U/L (ref 14–54)
AST: 37 U/L (ref 15–41)
Albumin: 4.3 g/dL (ref 3.5–5.0)
Alkaline Phosphatase: 85 U/L (ref 38–126)
Anion gap: 9 (ref 5–15)
BUN: 21 mg/dL — ABNORMAL HIGH (ref 6–20)
CHLORIDE: 103 mmol/L (ref 101–111)
CO2: 27 mmol/L (ref 22–32)
CREATININE: 0.93 mg/dL (ref 0.44–1.00)
Calcium: 9.8 mg/dL (ref 8.9–10.3)
GFR, EST NON AFRICAN AMERICAN: 55 mL/min — AB (ref >60)
Glucose, Bld: 113 mg/dL — ABNORMAL HIGH (ref 65–99)
Potassium: 3.8 mmol/L (ref 3.5–5.1)
Sodium: 139 mmol/L (ref 135–145)
Total Bilirubin: 0.3 mg/dL (ref 0.3–1.2)
Total Protein: 8.9 g/dL — ABNORMAL HIGH (ref 6.5–8.1)

## 2017-03-10 LAB — CBC WITH DIFFERENTIAL/PLATELET
Basophils Absolute: 0 10*3/uL (ref 0–0.1)
Basophils Relative: 1 %
EOS PCT: 3 %
Eosinophils Absolute: 0.2 10*3/uL (ref 0–0.7)
HCT: 45.5 % (ref 35.0–47.0)
Hemoglobin: 15 g/dL (ref 12.0–16.0)
LYMPHS ABS: 1.6 10*3/uL (ref 1.0–3.6)
LYMPHS PCT: 25 %
MCH: 29.5 pg (ref 26.0–34.0)
MCHC: 33 g/dL (ref 32.0–36.0)
MCV: 89.4 fL (ref 80.0–100.0)
MONO ABS: 0.4 10*3/uL (ref 0.2–0.9)
MONOS PCT: 7 %
Neutro Abs: 4.2 10*3/uL (ref 1.4–6.5)
Neutrophils Relative %: 64 %
PLATELETS: 179 10*3/uL (ref 150–440)
RBC: 5.09 MIL/uL (ref 3.80–5.20)
RDW: 14.1 % (ref 11.5–14.5)
WBC: 6.5 10*3/uL (ref 3.6–11.0)

## 2017-03-10 MED ORDER — LORAZEPAM 1 MG PO TABS
ORAL_TABLET | ORAL | Status: AC
  Administered 2017-03-10: 1 mg via ORAL
  Filled 2017-03-10: qty 1

## 2017-03-10 MED ORDER — LORAZEPAM 1 MG PO TABS
1.0000 mg | ORAL_TABLET | Freq: Once | ORAL | Status: AC
  Administered 2017-03-10: 1 mg via ORAL
  Filled 2017-03-10: qty 1

## 2017-03-10 MED ORDER — LORAZEPAM 1 MG PO TABS
1.0000 mg | ORAL_TABLET | ORAL | Status: AC
  Administered 2017-03-10: 1 mg via ORAL
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
## 2017-03-10 NOTE — ED Notes (Signed)
Pt changed from hospital gown to behavioral clothing by this tech and Marcie Bal (EDT tech)

## 2017-03-10 NOTE — ED Notes (Signed)
BEHAVIORAL HEALTH ROUNDING Patient sleeping: No Patient alert and oriented: Yes Behavior appropriate: Yes Describe behavior: No inappropriate or unacceptable behaviors noted at this time.  Nutrition and fluids offered: Yes Toileting and hygiene offered: Yes Sitter present: Behavioral tech rounding every 15 minutes on patient to ensure safety.  Law enforcement present: Yes Event organiser agency: [employer] ([facility])
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
## 2017-03-10 NOTE — ED Notes (Signed)
Pt provided ginger ale and sandwich tray, per request.

## 2017-03-10 NOTE — ED Notes (Signed)
SOC is at bedside and connected

## 2017-03-10 NOTE — ED Notes (Signed)
Pt coming out of exam room multiple times and having to be redirected by this tech, pt continues to ask for "sleeping medicine"

## 2017-03-10 NOTE — ED Notes (Signed)
BEHAVIORAL HEALTH ROUNDING Patient sleeping: No Patient alert and oriented: Yes Behavior appropriate: Yes Describe behavior: No inappropriate or unacceptable behaviors noted at this time.  Nutrition and fluids offered: Yes Toileting and hygiene offered: Yes Sitter present: Behavioral tech rounding every 15 minutes on patient to ensure safety.  Law enforcement present: Yes Event organiser agency: [place] ([employer])

## 2017-03-10 NOTE — ED Notes (Signed)
Patient awaiting placement will be housed in quad without being dressed out per charge nurse and MD.
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
## 2017-03-10 NOTE — ED Notes (Signed)
Pt provided sandwich tray by [employer] officer [facility]

## 2017-03-10 NOTE — ED Notes (Signed)
Report given to [facility] MD prior to consult.

## 2017-03-11 ENCOUNTER — Encounter: Payer: Self-pay | Admitting: Unknown Physician Specialty

## 2017-03-11 ENCOUNTER — Ambulatory Visit (INDEPENDENT_AMBULATORY_CARE_PROVIDER_SITE_OTHER): Payer: Medicare Other | Admitting: Unknown Physician Specialty

## 2017-03-11 VITALS — BP 132/81 | HR 77 | Temp 97.8°F | Wt 122.8 lb

## 2017-03-11 DIAGNOSIS — J4540 Moderate persistent asthma, uncomplicated: Secondary | ICD-10-CM

## 2017-03-11 DIAGNOSIS — F322 Major depressive disorder, single episode, severe without psychotic features: Secondary | ICD-10-CM

## 2017-03-11 DIAGNOSIS — G308 Other Alzheimer's disease: Secondary | ICD-10-CM

## 2017-03-11 DIAGNOSIS — T148XXA Other injury of unspecified body region, initial encounter: Secondary | ICD-10-CM

## 2017-03-11 DIAGNOSIS — Z789 Other specified health status: Secondary | ICD-10-CM

## 2017-03-11 DIAGNOSIS — R52 Pain, unspecified: Secondary | ICD-10-CM | POA: Insufficient documentation

## 2017-03-11 DIAGNOSIS — F250 Schizoaffective disorder, bipolar type: Secondary | ICD-10-CM

## 2017-03-11 DIAGNOSIS — S61412A Laceration without foreign body of left hand, initial encounter: Secondary | ICD-10-CM | POA: Diagnosis not present

## 2017-03-11 DIAGNOSIS — J45909 Unspecified asthma, uncomplicated: Secondary | ICD-10-CM | POA: Insufficient documentation

## 2017-03-11 DIAGNOSIS — F0281 Dementia in other diseases classified elsewhere with behavioral disturbance: Secondary | ICD-10-CM

## 2017-03-11 MED ORDER — GABAPENTIN 100 MG PO CAPS
200.0000 mg | ORAL_CAPSULE | Freq: Three times a day (TID) | ORAL | Status: DC
  Administered 2017-03-11: 200 mg via ORAL
  Filled 2017-03-11 (×3): qty 2

## 2017-03-11 MED ORDER — MELATONIN 5 MG PO TABS
3.0000 mg | ORAL_TABLET | Freq: Every day | ORAL | Status: DC
  Filled 2017-03-11: qty 1

## 2017-03-11 MED ORDER — MEMANTINE HCL 5 MG PO TABS
5.0000 mg | ORAL_TABLET | Freq: Two times a day (BID) | ORAL | Status: DC
  Administered 2017-03-11: 5 mg via ORAL
  Filled 2017-03-11: qty 1

## 2017-03-11 MED ORDER — ACETAMINOPHEN 500 MG PO TABS: 500.0000 mg | ORAL_TABLET | ORAL | Status: DC | PRN

## 2017-03-11 MED ORDER — LORAZEPAM 1 MG PO TABS
1.0000 mg | ORAL_TABLET | Freq: Once | ORAL | Status: AC
  Administered 2017-03-11: 1 mg via ORAL
  Filled 2017-03-11: qty 1

## 2017-03-11 MED ORDER — LEVOTHYROXINE SODIUM 125 MCG PO TABS: 125.0000 ug | ORAL_TABLET | Freq: Every day | ORAL | 0 refills | Status: DC

## 2017-03-11 MED ORDER — ALBUTEROL SULFATE HFA 108 (90 BASE) MCG/ACT IN AERS: 2.0000 | INHALATION_SPRAY | Freq: Four times a day (QID) | RESPIRATORY_TRACT | 0 refills | Status: DC | PRN

## 2017-03-11 MED ORDER — BENZTROPINE MESYLATE 1 MG PO TABS
0.5000 mg | ORAL_TABLET | Freq: Once | ORAL | Status: AC
  Administered 2017-03-11: 0.5 mg via ORAL
  Filled 2017-03-11: qty 1

## 2017-03-11 MED ORDER — LORAZEPAM 0.5 MG PO TABS: 0.5000 mg | ORAL_TABLET | Freq: Two times a day (BID) | ORAL | 0 refills | Status: DC | PRN

## 2017-03-11 MED ORDER — POTASSIUM CHLORIDE CRYS ER 20 MEQ PO TBCR
10.0000 meq | EXTENDED_RELEASE_TABLET | ORAL | Status: DC
  Administered 2017-03-11: 10 meq via ORAL
  Filled 2017-03-11: qty 1

## 2017-03-11 MED ORDER — FLUTICASONE-SALMETEROL 250-50 MCG/DOSE IN AEPB: 1.0000 | INHALATION_SPRAY | Freq: Two times a day (BID) | RESPIRATORY_TRACT | 0 refills | Status: DC

## 2017-03-11 MED ORDER — HALOPERIDOL 2 MG PO TABS
2.0000 mg | ORAL_TABLET | Freq: Once | ORAL | Status: AC
  Administered 2017-03-11: 2 mg via ORAL
  Filled 2017-03-11: qty 1

## 2017-03-11 MED ORDER — METOPROLOL TARTRATE 25 MG PO TABS: 12.5000 mg | ORAL_TABLET | Freq: Two times a day (BID) | ORAL | 0 refills | Status: DC

## 2017-03-11 MED ORDER — METOPROLOL TARTRATE 25 MG PO TABS
12.5000 mg | ORAL_TABLET | Freq: Two times a day (BID) | ORAL | Status: DC
  Administered 2017-03-11: 12.5 mg via ORAL
  Filled 2017-03-11: qty 1

## 2017-03-11 MED ORDER — SENNA 8.6 MG PO TABS: 1.0000 | ORAL_TABLET | ORAL | Status: DC

## 2017-03-11 MED ORDER — GABAPENTIN 100 MG PO CAPS: 200.0000 mg | ORAL_CAPSULE | Freq: Three times a day (TID) | ORAL | 1 refills | Status: DC

## 2017-03-11 MED ORDER — DIVALPROEX SODIUM ER 250 MG PO TB24: 250.0000 mg | ORAL_TABLET | Freq: Every day | ORAL | Status: DC

## 2017-03-11 MED ORDER — MEMANTINE HCL 5 MG PO TABS: 5.0000 mg | ORAL_TABLET | Freq: Two times a day (BID) | ORAL | 0 refills | Status: DC

## 2017-03-11 MED ORDER — FUROSEMIDE 20 MG PO TABS: 20.0000 mg | ORAL_TABLET | ORAL | 0 refills | Status: DC

## 2017-03-11 MED ORDER — ALBUTEROL SULFATE HFA 108 (90 BASE) MCG/ACT IN AERS: 2.0000 | INHALATION_SPRAY | Freq: Four times a day (QID) | RESPIRATORY_TRACT | Status: DC | PRN

## 2017-03-11 MED ORDER — POTASSIUM CHLORIDE CRYS ER 10 MEQ PO TBCR: 10.0000 meq | EXTENDED_RELEASE_TABLET | ORAL | 0 refills | Status: DC

## 2017-03-11 MED ORDER — DIVALPROEX SODIUM ER 250 MG PO TB24: 250.0000 mg | ORAL_TABLET | Freq: Every day | ORAL | 0 refills | Status: DC

## 2017-03-11 MED ORDER — OLANZAPINE 10 MG PO TABS: 5.0000 mg | ORAL_TABLET | Freq: Two times a day (BID) | ORAL | 0 refills | Status: DC

## 2017-03-11 MED ORDER — VITAMIN D 1000 UNITS PO TABS
2000.0000 [IU] | ORAL_TABLET | Freq: Every day | ORAL | Status: DC
  Administered 2017-03-11: 2000 [IU] via ORAL
  Filled 2017-03-11: qty 2

## 2017-03-11 MED ORDER — ALBUTEROL SULFATE (2.5 MG/3ML) 0.083% IN NEBU: 3.0000 mL | INHALATION_SOLUTION | Freq: Four times a day (QID) | RESPIRATORY_TRACT | Status: DC | PRN

## 2017-03-11 MED ORDER — TRAZODONE HCL 100 MG PO TABS
100.0000 mg | ORAL_TABLET | Freq: Every day | ORAL | Status: DC
  Administered 2017-03-11: 100 mg via ORAL

## 2017-03-11 MED ORDER — LEVOTHYROXINE SODIUM 50 MCG PO TABS: 125.0000 ug | ORAL_TABLET | Freq: Every day | ORAL | Status: DC

## 2017-03-11 MED ORDER — SACCHAROMYCES BOULARDII 250 MG PO CAPS
250.0000 mg | ORAL_CAPSULE | Freq: Two times a day (BID) | ORAL | Status: DC
  Filled 2017-03-11: qty 1

## 2017-03-11 MED ORDER — FUROSEMIDE 40 MG PO TABS
20.0000 mg | ORAL_TABLET | ORAL | Status: DC
  Administered 2017-03-11: 20 mg via ORAL
  Filled 2017-03-11: qty 1

## 2017-03-11 MED ORDER — ONDANSETRON HCL 8 MG PO TABS: 8.0000 mg | ORAL_TABLET | Freq: Three times a day (TID) | ORAL | 0 refills | Status: AC | PRN

## 2017-03-11 MED ORDER — LORAZEPAM 1 MG PO TABS: 1.0000 mg | ORAL_TABLET | Freq: Once | ORAL | Status: DC

## 2017-03-11 MED ORDER — ONDANSETRON HCL 4 MG PO TABS: 8.0000 mg | ORAL_TABLET | Freq: Three times a day (TID) | ORAL | Status: DC | PRN

## 2017-03-11 MED ORDER — TRAMADOL HCL 50 MG PO TABS: 50.0000 mg | ORAL_TABLET | Freq: Two times a day (BID) | ORAL | 0 refills | Status: DC | PRN

## 2017-03-11 MED ORDER — SODIUM CHLORIDE 1 G PO TABS
1.0000 g | ORAL_TABLET | Freq: Three times a day (TID) | ORAL | Status: DC
  Administered 2017-03-11: 1 g via ORAL
  Filled 2017-03-11 (×3): qty 1

## 2017-03-11 MED ORDER — DOCUSATE SODIUM 100 MG PO CAPS
100.0000 mg | ORAL_CAPSULE | Freq: Two times a day (BID) | ORAL | Status: DC
  Administered 2017-03-11: 100 mg via ORAL
  Filled 2017-03-11: qty 1

## 2017-03-11 MED ORDER — TRAZODONE HCL 100 MG PO TABS
ORAL_TABLET | ORAL | Status: AC
  Filled 2017-03-11: qty 1

## 2017-03-11 MED ORDER — FLUTICASONE PROPIONATE 50 MCG/ACT NA SUSP
2.0000 | Freq: Every day | NASAL | Status: DC
  Filled 2017-03-11: qty 16

## 2017-03-11 MED ORDER — TRAMADOL HCL 50 MG PO TABS: 50.0000 mg | ORAL_TABLET | Freq: Two times a day (BID) | ORAL | Status: DC | PRN

## 2017-03-11 MED ORDER — LORATADINE 10 MG PO TABS
10.0000 mg | ORAL_TABLET | Freq: Every day | ORAL | Status: DC
  Administered 2017-03-11: 10 mg via ORAL
  Filled 2017-03-11: qty 1

## 2017-03-11 MED ORDER — FLUTICASONE PROPIONATE 50 MCG/ACT NA SUSP: 2.0000 | Freq: Every day | NASAL | 0 refills | Status: DC

## 2017-03-11 MED ORDER — FERROUS SULFATE 325 (65 FE) MG PO TABS: 325.0000 mg | ORAL_TABLET | Freq: Every day | ORAL | Status: DC

## 2017-03-11 MED ORDER — OLANZAPINE 5 MG PO TABS
5.0000 mg | ORAL_TABLET | Freq: Two times a day (BID) | ORAL | Status: DC
  Administered 2017-03-11: 5 mg via ORAL
  Filled 2017-03-11: qty 1

## 2017-03-11 MED ORDER — LOPERAMIDE HCL 2 MG PO CAPS: 2.0000 mg | ORAL_CAPSULE | ORAL | Status: DC | PRN

## 2017-03-11 MED ORDER — SERTRALINE HCL 50 MG PO TABS: 50.0000 mg | ORAL_TABLET | Freq: Every day | ORAL | 1 refills | Status: DC

## 2017-03-11 MED ORDER — LORAZEPAM 0.5 MG PO TABS
0.5000 mg | ORAL_TABLET | Freq: Two times a day (BID) | ORAL | Status: DC
  Administered 2017-03-11: 0.5 mg via ORAL
  Filled 2017-03-11: qty 1

## 2017-03-11 NOTE — ED Notes (Signed)
Voluntary/ pending placement

## 2017-03-11 NOTE — ED Notes (Signed)
BEHAVIORAL HEALTH ROUNDING Patient sleeping: No Patient alert and oriented: Yes Behavior appropriate: Yes Describe behavior: No inappropriate or unacceptable behaviors noted at this time.  Nutrition and fluids offered: Yes Toileting and hygiene offered: Yes Sitter present: Behavioral tech rounding every 15 minutes on patient to ensure safety.  Law enforcement present: Yes Event organiser agency: [place] (ODS)

## 2017-03-11 NOTE — ED Notes (Signed)
Pt ambulated to bathroom with no assistance.  

## 2017-03-11 NOTE — Assessment & Plan Note (Addendum)
Multiple co-morbid conditions.  Children don't thing the Schitzophrenia in the chart is not accurate.  They feel she has autism.  Will refer to psychiatry

## 2017-03-11 NOTE — ED Notes (Signed)
Pt changed into dry diaper and clean pants. Pt given breakfast tray.

## 2017-03-11 NOTE — ED Notes (Signed)
Patient was given lunch.

## 2017-03-11 NOTE — ED Notes (Signed)
Breakfast tray placed in pt room. Pt sleeping 

## 2017-03-11 NOTE — Progress Notes (Signed)
LCSW engaged with daughter in ed lobby and followed up with Dr Cinda Quest and he agreed that patient will be medicated for transport home. Daughter will come back to hospital at 2:30 to pick up her mother. Patients daughter apologetic but explained her situation. LCSW informed her the [facility] called and she could follow up with them. I was not able to provided any information to facilities as per doctors orders issued and 6:30am. She understood. LCSW was informed by daughter her plan is to care for her mother at home until a suitable SNF can be arranged. She will bring her mother to Primary care doctor today and they will assist with Fl2 and prescription medication.   No further needs  [employer] LCSW [phone]
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
## 2017-03-11 NOTE — ED Notes (Signed)
BEHAVIORAL HEALTH ROUNDING Patient sleeping: No Patient alert and oriented: Yes Behavior appropriate: Yes Describe behavior: No inappropriate or unacceptable behaviors noted at this time.  Nutrition and fluids offered: Yes Toileting and hygiene offered: Yes Sitter present: Behavioral tech rounding every 15 minutes on patient to ensure safety.  Law enforcement present: Yes Event organiser agency: [place] (ODS)

## 2017-03-11 NOTE — Assessment & Plan Note (Signed)
Will refill inhalers

## 2017-03-11 NOTE — ED Notes (Signed)
Patient's daughter is at bedside.

## 2017-03-11 NOTE — ED Notes (Signed)
PATIENT BACK AT DOOR ASKING TO GO HOME , UPDATE PATIENT [place] BE ABLE TO TALK TO HER DAUGHTER.

## 2017-03-11 NOTE — ED Notes (Signed)

## 2017-03-11 NOTE — Assessment & Plan Note (Signed)
Taking Tramadol and Gabapentin

## 2017-03-11 NOTE — ED Notes (Signed)
BEHAVIORAL HEALTH ROUNDING Patient sleeping: No Patient alert and oriented: Yes Behavior appropriate: Yes Describe behavior: No inappropriate or unacceptable behaviors noted at this time.  Nutrition and fluids offered: Yes Toileting and hygiene offered: Yes Sitter present: Behavioral tech rounding every 15 minutes on patient to ensure safety.  Law enforcement present: Yes Event organiser agency: [place] (ODS)

## 2017-03-11 NOTE — ED Provider Notes (Signed)
patient is very restless and anxious getting up and down so she wants something to relax make her sleepy. We'll see if I can give her little bit of Ativan.   Nena Polio, MD 03/11/17 1048

## 2017-03-11 NOTE — ED Notes (Signed)
Dr. Carlena Hurl at bedside.
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
## 2017-03-11 NOTE — ED Notes (Signed)
BEHAVIORAL HEALTH ROUNDING Patient sleeping: No Patient alert and oriented: Yes Behavior appropriate: Yes Describe behavior: No inappropriate or unacceptable behaviors noted at this time.  Nutrition and fluids offered: Yes Toileting and hygiene offered: Yes Sitter present: Behavioral tech rounding every 15 minutes on patient to ensure safety.  Law enforcement present: Yes Event organiser agency: [place] (ODS)

## 2017-03-11 NOTE — ED Provider Notes (Signed)
-----------------------------------------   6:30 AM on 03/11/2017 -----------------------------------------   Blood pressure (!) 184/85, pulse 79, temperature 98.4 F (36.9 C), resp. rate 18, height 1.626 m (5\' 4" ), weight 58.1 kg (128 lb), SpO2 96 %.  The patient had no acute events since last update.  Calm and cooperative at this time.  The patient has been cleared from psychiatry and that the patient's daughter will pick her up today and is working on placement in a skilled nursing facility herself and does not need social work assistance.    Hinda Kehr, MD 03/11/17 [phone]

## 2017-03-11 NOTE — Discharge Instructions (Addendum)
please follow-up with her doctor today as planned. I have given her some medication Ativan and Haldol to sedate her. This should last until her doctor Costella Hatcher and you can get another prescription filled. Please return for any further problems.

## 2017-03-12 ENCOUNTER — Other Ambulatory Visit: Payer: Self-pay

## 2017-03-12 ENCOUNTER — Emergency Department
Admission: EM | Admit: 2017-03-12 | Discharge: 2017-03-16 | Disposition: A | Discharge status: Home / Self Care | Payer: Medicare Other | Attending: Emergency Medicine | Admitting: Emergency Medicine

## 2017-03-12 DIAGNOSIS — E039 Hypothyroidism, unspecified: Secondary | ICD-10-CM | POA: Insufficient documentation

## 2017-03-12 DIAGNOSIS — F259 Schizoaffective disorder, unspecified: Secondary | ICD-10-CM | POA: Insufficient documentation

## 2017-03-12 DIAGNOSIS — Z8673 Personal history of transient ischemic attack (TIA), and cerebral infarction without residual deficits: Secondary | ICD-10-CM | POA: Diagnosis not present

## 2017-03-12 DIAGNOSIS — Z950 Presence of cardiac pacemaker: Secondary | ICD-10-CM | POA: Insufficient documentation

## 2017-03-12 DIAGNOSIS — F0391 Unspecified dementia with behavioral disturbance: Secondary | ICD-10-CM | POA: Diagnosis not present

## 2017-03-12 DIAGNOSIS — E119 Type 2 diabetes mellitus without complications: Secondary | ICD-10-CM | POA: Insufficient documentation

## 2017-03-12 DIAGNOSIS — Z046 Encounter for general psychiatric examination, requested by authority: Secondary | ICD-10-CM | POA: Diagnosis present

## 2017-03-12 DIAGNOSIS — Z79899 Other long term (current) drug therapy: Secondary | ICD-10-CM | POA: Insufficient documentation

## 2017-03-12 LAB — CBC
HCT: 45.7 % (ref 35.0–47.0)
Hemoglobin: 14.9 g/dL (ref 12.0–16.0)
MCH: 29.3 pg (ref 26.0–34.0)
MCHC: 32.7 g/dL (ref 32.0–36.0)
MCV: 89.7 fL (ref 80.0–100.0)
PLATELETS: 205 10*3/uL (ref 150–440)
RBC: 5.09 MIL/uL (ref 3.80–5.20)
RDW: 14.3 % (ref 11.5–14.5)
WBC: 6.9 10*3/uL (ref 3.6–11.0)

## 2017-03-12 LAB — URINE DRUG SCREEN, QUALITATIVE (ARMC ONLY)
AMPHETAMINES, UR SCREEN: NOT DETECTED
BENZODIAZEPINE, UR SCRN: POSITIVE — AB
Barbiturates, Ur Screen: NOT DETECTED
Cannabinoid 50 Ng, Ur ~~LOC~~: NOT DETECTED
Cocaine Metabolite,Ur ~~LOC~~: NOT DETECTED
MDMA (Ecstasy)Ur Screen: NOT DETECTED
METHADONE SCREEN, URINE: NOT DETECTED
Opiate, Ur Screen: NOT DETECTED
Phencyclidine (PCP) Ur S: NOT DETECTED
TRICYCLIC, UR SCREEN: NOT DETECTED

## 2017-03-12 LAB — COMPREHENSIVE METABOLIC PANEL
ALBUMIN: 4.1 g/dL (ref 3.5–5.0)
ALT: 25 U/L (ref 14–54)
ANION GAP: 8 (ref 5–15)
AST: 40 U/L (ref 15–41)
Alkaline Phosphatase: 82 U/L (ref 38–126)
BILIRUBIN TOTAL: 0.5 mg/dL (ref 0.3–1.2)
BUN: 23 mg/dL — ABNORMAL HIGH (ref 6–20)
CO2: 25 mmol/L (ref 22–32)
Calcium: 9.4 mg/dL (ref 8.9–10.3)
Chloride: 106 mmol/L (ref 101–111)
Creatinine, Ser: 1.21 mg/dL — ABNORMAL HIGH (ref 0.44–1.00)
GFR calc Af Amer: 47 mL/min — ABNORMAL LOW (ref >60)
GFR, EST NON AFRICAN AMERICAN: 40 mL/min — AB (ref >60)
GLUCOSE: 98 mg/dL (ref 65–99)
POTASSIUM: 4 mmol/L (ref 3.5–5.1)
Sodium: 139 mmol/L (ref 135–145)
TOTAL PROTEIN: 8.2 g/dL — AB (ref 6.5–8.1)

## 2017-03-12 LAB — SALICYLATE LEVEL: Salicylate Lvl: <7 mg/dL (ref 2.8–30.0)

## 2017-03-12 LAB — ETHANOL

## 2017-03-12 LAB — ACETAMINOPHEN LEVEL

## 2017-03-12 MED ORDER — FERROUS SULFATE 325 (65 FE) MG PO TABS
325.0000 mg | ORAL_TABLET | Freq: Every day | ORAL | Status: DC
  Administered 2017-03-13 – 2017-03-16 (×4): 325 mg via ORAL
  Filled 2017-03-12 (×4): qty 1

## 2017-03-12 MED ORDER — LEVOTHYROXINE SODIUM 50 MCG PO TABS
125.0000 ug | ORAL_TABLET | Freq: Every day | ORAL | Status: DC
  Administered 2017-03-13 – 2017-03-16 (×3): 125 ug via ORAL
  Filled 2017-03-12 (×4): qty 3

## 2017-03-12 MED ORDER — DOCUSATE SODIUM 100 MG PO CAPS
100.0000 mg | ORAL_CAPSULE | Freq: Two times a day (BID) | ORAL | Status: DC
  Administered 2017-03-12 – 2017-03-16 (×8): 100 mg via ORAL
  Filled 2017-03-12 (×8): qty 1

## 2017-03-12 MED ORDER — GABAPENTIN 100 MG PO CAPS
200.0000 mg | ORAL_CAPSULE | Freq: Three times a day (TID) | ORAL | Status: DC
  Administered 2017-03-12 – 2017-03-16 (×11): 200 mg via ORAL
  Filled 2017-03-12 (×14): qty 2

## 2017-03-12 MED ORDER — FUROSEMIDE 40 MG PO TABS
20.0000 mg | ORAL_TABLET | ORAL | Status: DC
  Administered 2017-03-14: 20 mg via ORAL
  Administered 2017-03-16: 40 mg via ORAL
  Filled 2017-03-12 (×3): qty 1

## 2017-03-12 MED ORDER — FLUTICASONE PROPIONATE 50 MCG/ACT NA SUSP
2.0000 | Freq: Every day | NASAL | Status: DC
  Administered 2017-03-13 – 2017-03-16 (×4): 2 via NASAL
  Filled 2017-03-12: qty 16

## 2017-03-12 MED ORDER — METOPROLOL TARTRATE 25 MG PO TABS
12.5000 mg | ORAL_TABLET | Freq: Two times a day (BID) | ORAL | Status: DC
  Administered 2017-03-12 – 2017-03-16 (×8): 12.5 mg via ORAL
  Filled 2017-03-12 (×8): qty 1

## 2017-03-12 MED ORDER — ONDANSETRON HCL 4 MG PO TABS: 8.0000 mg | ORAL_TABLET | Freq: Three times a day (TID) | ORAL | Status: DC | PRN

## 2017-03-12 MED ORDER — DIVALPROEX SODIUM ER 250 MG PO TB24
250.0000 mg | ORAL_TABLET | Freq: Every day | ORAL | Status: DC
  Administered 2017-03-13 – 2017-03-16 (×4): 250 mg via ORAL
  Filled 2017-03-12 (×5): qty 1

## 2017-03-12 MED ORDER — OLANZAPINE 5 MG PO TABS
5.0000 mg | ORAL_TABLET | Freq: Two times a day (BID) | ORAL | Status: DC
  Administered 2017-03-12 – 2017-03-16 (×8): 5 mg via ORAL
  Filled 2017-03-12 (×8): qty 1

## 2017-03-12 MED ORDER — VITAMIN D 1000 UNITS PO TABS
2000.0000 [IU] | ORAL_TABLET | Freq: Every day | ORAL | Status: DC
  Administered 2017-03-13 – 2017-03-16 (×4): 2000 [IU] via ORAL
  Filled 2017-03-12 (×4): qty 2

## 2017-03-12 MED ORDER — LORATADINE 10 MG PO TABS
10.0000 mg | ORAL_TABLET | Freq: Every day | ORAL | Status: DC
  Administered 2017-03-13 – 2017-03-16 (×4): 10 mg via ORAL
  Filled 2017-03-12 (×5): qty 1

## 2017-03-12 MED ORDER — LORAZEPAM 0.5 MG PO TABS
0.5000 mg | ORAL_TABLET | Freq: Two times a day (BID) | ORAL | Status: DC | PRN
  Administered 2017-03-12 – 2017-03-16 (×7): 0.5 mg via ORAL
  Filled 2017-03-12 (×7): qty 1

## 2017-03-12 MED ORDER — SERTRALINE HCL 50 MG PO TABS
50.0000 mg | ORAL_TABLET | Freq: Every day | ORAL | Status: DC
  Administered 2017-03-12 – 2017-03-15 (×4): 50 mg via ORAL
  Filled 2017-03-12 (×4): qty 1

## 2017-03-12 MED ORDER — ALBUTEROL SULFATE HFA 108 (90 BASE) MCG/ACT IN AERS
2.0000 | INHALATION_SPRAY | Freq: Four times a day (QID) | RESPIRATORY_TRACT | Status: DC | PRN
  Filled 2017-03-12: qty 6.7

## 2017-03-12 MED ORDER — MEMANTINE HCL 5 MG PO TABS
5.0000 mg | ORAL_TABLET | Freq: Two times a day (BID) | ORAL | Status: DC
  Administered 2017-03-12 – 2017-03-16 (×8): 5 mg via ORAL
  Filled 2017-03-12 (×8): qty 1

## 2017-03-12 NOTE — ED Notes (Signed)
BEHAVIORAL HEALTH ROUNDING  Patient sleeping: No.  Patient alert and oriented: yes  Behavior appropriate: Yes. ; If no, describe:  Nutrition and fluids offered: Yes  Toileting and hygiene offered: Yes  Sitter present: not applicable, Q 15 min safety rounds and observation.  Law enforcement present: Yes [employer]  

## 2017-03-12 NOTE — ED Notes (Signed)
Unsure of what meds pt took this morning prior to arrival. Unable to get in touch with [facility]. Holding meds til tomorrow morning per Dr. Jacqualine Code

## 2017-03-12 NOTE — ED Notes (Signed)
Pt is not IVC

## 2017-03-12 NOTE — ED Notes (Signed)
BEHAVIORAL HEALTH ROUNDING  Patient sleeping: No.  Patient alert and oriented: alert, oriented to her self.  Behavior appropriate: Yes. ; If no, describe:  Nutrition and fluids offered: Yes  Toileting and hygiene offered: Yes  Sitter present: not applicable, Q 15 min safety rounds and observation.  Law enforcement present: Yes [place]

## 2017-03-12 NOTE — ED Notes (Signed)
Pt changed by this RN and Tammy EDT. Jacket given to family. Rest of pt clothes bagged and labeled (bra, shirt, blue pants, socks).   Family sat back out in lobby to wait to talk to RN or MD.

## 2017-03-12 NOTE — ED Notes (Signed)
Report given to Dr Leone Payor from [facility]. Camera in room and on.

## 2017-03-12 NOTE — NC FL2 (Signed)
[facility] LEVEL OF CARE SCREENING TOOL     IDENTIFICATION  Patient Name: Jill Copeland Birthdate: May 01, 1931 Sex: female Admission Date (Current Location): 03/12/2017  [place] and [place] Number:  Engineering geologist and Address:  [facility], [address] [place], [place] [address]      Provider Number: [id number]  Attending Physician Name and Address:  No att. providers found  Relative Name and Phone Number:   Colen Darling [phone]    Current Level of Care: Hospital Recommended Level of Care: Memory Care Prior Approval Number:    Date Approved/Denied:   PASRR Number:  [id number]  Discharge Plan: Domiciliary (Rest home)    Current Diagnoses: Patient Active Problem List   Diagnosis Date Noted  . Asthma 03/11/2017  . Pain management 03/11/2017  . Advanced care planning/counseling discussion 11/19/2016  . Impaired mobility and ADLs 11/19/2016  . Adventitious breath sounds 11/19/2016  . Pacemaker infection ([place]) 10/11/2016  . Endocarditis 10/09/2016  . Schizoaffective disorder ([place])   . Pressure ulcer 01/13/2015  . Multiple falls 01/12/2015  . Overactive bladder 12/30/2014  . Alzheimer disease   . Schizo affective schizophrenia ([place])   . Recurrent pneumonia   . Dysphagia causing pulmonary aspiration with swallowing   . Pneumococcal pneumonia ([place])   . Arrhythmia   . Esophageal stricture   . Hypothyroidism   . Lung nodule   . Dementia in chronic schizophrenia ([place]) 08/12/2014  . Social discord 08/12/2014  . Family conflict 60/63/0160    Orientation RESPIRATION BLADDER Height & Weight     Self  Normal Incontinent Weight: 122 lb (55.3 kg) Height:  5\' 4"  (162.6 cm)  BEHAVIORAL SYMPTOMS/MOOD NEUROLOGICAL BOWEL NUTRITION STATUS      Incontinent Diet(Normal- was previously diabetic now with 60lb weight loss is not diabetic now)  AMBULATORY STATUS COMMUNICATION OF NEEDS Skin   Independent Verbally Normal                        Personal Care Assistance Level of Assistance  Bathing, Feeding, Dressing, Total care Bathing Assistance: Limited assistance Feeding assistance: Limited assistance Dressing Assistance: Limited assistance Total Care Assistance: Limited assistance   Functional Limitations Info  Sight, Hearing, Speech Sight Info: Adequate Hearing Info: Adequate Speech Info: Adequate    SPECIAL CARE FACTORS FREQUENCY  Dementia                    Contractures Contractures Info: Not present    Additional Factors Info  Allergies   Allergies Info: Penicillins           Current Medications (03/12/2017):  This is the current hospital active medication list No current facility-administered medications for this encounter.    Current Outpatient Medications  Medication Sig Dispense Refill  . acetaminophen (TYLENOL) 500 MG tablet Take 500 mg by mouth every 4 (four) hours as needed.    Marland Kitchen albuterol (PROVENTIL HFA;VENTOLIN HFA) 108 (90 Base) MCG/ACT inhaler Inhale 2 puffs into the lungs every 6 (six) hours as needed for wheezing or shortness of breath. 1 Inhaler 0  . Cholecalciferol 2000 units TABS Take 2,000 Units by mouth daily.    . divalproex (DEPAKOTE ER) 250 MG 24 hr tablet Take 1 tablet (250 mg total) by mouth daily. 30 tablet 0  . docusate sodium (COLACE) 100 MG capsule Take 100 mg by mouth 2 (two) times daily.    . ferrous sulfate 325 (65 FE) MG tablet Take 325 mg  by mouth daily with breakfast.    . fluticasone (FLONASE) 50 MCG/ACT nasal spray Place 2 sprays into both nostrils daily. 16 g 0  . Fluticasone-Salmeterol (ADVAIR DISKUS) 250-50 MCG/DOSE AEPB Inhale 1 puff into the lungs 2 (two) times daily. 60 each 0  . furosemide (LASIX) 20 MG tablet Take 1 tablet (20 mg total) by mouth every other day. 30 tablet 0  . gabapentin (NEURONTIN) 100 MG capsule Take 2 capsules (200 mg total) by mouth 3 (three) times daily. 90 capsule 1  . levothyroxine (SYNTHROID, LEVOTHROID) 125  MCG tablet Take 1 tablet (125 mcg total) by mouth daily before breakfast. 30 tablet 0  . loperamide (IMODIUM) 2 MG capsule Take 2 mg by mouth as needed for diarrhea or loose stools.    Marland Kitchen loratadine (CLARITIN) 10 MG tablet Take 10 mg by mouth daily.    Marland Kitchen LORazepam (ATIVAN) 0.5 MG tablet Take 1 tablet (0.5 mg total) by mouth 2 (two) times daily as needed for anxiety. 60 tablet 0  . Melatonin 3 MG TABS Take 3 mg by mouth at bedtime.    . memantine (NAMENDA) 5 MG tablet Take 1 tablet (5 mg total) by mouth 2 (two) times daily. 60 tablet 0  . metoprolol tartrate (LOPRESSOR) 25 MG tablet Take 0.5 tablets (12.5 mg total) by mouth 2 (two) times daily. 60 tablet 0  . OLANZapine (ZYPREXA) 10 MG tablet Take 0.5 tablets (5 mg total) by mouth 2 (two) times daily. 60 tablet 0  . ondansetron (ZOFRAN) 8 MG tablet Take 1 tablet (8 mg total) by mouth every 8 (eight) hours as needed for nausea or vomiting. 90 tablet 0  . potassium chloride (K-DUR,KLOR-CON) 10 MEQ tablet Take 1 tablet (10 mEq total) by mouth every other day. 30 tablet 0  . saccharomyces boulardii (FLORASTOR) 250 MG capsule Take 1 capsule (250 mg total) by mouth 2 (two) times daily. 30 capsule 0  . senna (SENOKOT) 8.6 MG TABS tablet Take 1 tablet by mouth 2 (two) times a week. Mondays and Fridays    . sertraline (ZOLOFT) 50 MG tablet Take 1 tablet (50 mg total) by mouth at bedtime. 90 tablet 1  . sodium chloride 1 G tablet Take 1 g by mouth 3 (three) times daily.    . traMADol (ULTRAM) 50 MG tablet Take 1 tablet (50 mg total) by mouth 2 (two) times daily as needed for moderate pain. 60 tablet 0     Discharge Medications: Please see discharge summary for a list of discharge medications.  Relevant Imaging Results:  Relevant Lab Results:   Additional Information [id number]  Joana Reamer, [place]
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
## 2017-03-12 NOTE — ED Notes (Signed)
ENVIRONMENTAL ASSESSMENT  Potentially harmful objects out of patient reach: Yes.  Personal belongings secured: Yes.  Patient dressed in hospital provided attire only: Yes.  Plastic bags out of patient reach: Yes.  Patient care equipment (cords, cables, call bells, lines, and drains) shortened, removed, or accounted for: Yes.  Equipment and supplies removed from bottom of stretcher: Yes.  Potentially toxic materials out of patient reach: Yes.  Sharps container removed or out of patient reach: Yes.   BEHAVIORAL HEALTH ROUNDING  Patient sleeping: No.  Patient alert and oriented: alert oriented to self only per her baseline Behavior appropriate: Yes. ; If no, describe:  Nutrition and fluids offered: Yes  Toileting and hygiene offered: Yes  Sitter present: not applicable, Q 15 min safety rounds and observation.  Law enforcement present: Yes ODS
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
## 2017-03-12 NOTE — Progress Notes (Signed)
LCSW called [facility] to see if patient could be accepted  and placed earlier than Dec 26th and waiting call back [facility]  It was explained by day supervisor Davy Pique its most unlikely as most admissions staff off until Dec 26th but she agreed to text admissions and request they call me back.  [employer] LCSW [phone]
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
## 2017-03-13 ENCOUNTER — Other Ambulatory Visit: Payer: Self-pay

## 2017-03-13 DIAGNOSIS — F0391 Unspecified dementia with behavioral disturbance: Secondary | ICD-10-CM | POA: Diagnosis not present

## 2017-03-13 NOTE — ED Provider Notes (Signed)
-----------------------------------------   6:33 AM on 03/13/2017 -----------------------------------------  Patient has been seen by psychiatry, does not meet inpatient criteria for psychiatry admission.  Social work currently working with the patient for placement.  Possible placement pending 03/16/17.   Harvest Dark, MD 03/13/17 [phone]

## 2017-03-13 NOTE — ED Notes (Signed)
Pt asking for a chair. States she would like to sit upright and she is tired of laying in bed. Blue reclining chair brought into room for patient. Pt sitting calmly in chair at this time watching tv.

## 2017-03-13 NOTE — ED Notes (Signed)
BEHAVIORAL HEALTH ROUNDING Patient sleeping: Yes.   Patient alert and oriented: not applicable SLEEPING Behavior appropriate: Yes.  ; If no, describe: SLEEPING Nutrition and fluids offered: No SLEEPING Toileting and hygiene offered: NoSLEEPING Sitter present: not applicable, Q 15 min safety rounds and observation. Law enforcement present: Yes ODS 

## 2017-03-13 NOTE — BH Assessment (Signed)
Patient evaluated by [facility].  Pt does not meet criteria for inpatient hospitalization.  Social Work is currently assisting patient's family with placement at the [facility].  Anticipated date of placement is 03/16/17.

## 2017-03-13 NOTE — Progress Notes (Signed)
LCSW provided 1-1 support for patients daughter via the phone. It was explained that her mother is doing well in ED and daughter reported her medications must be kept the same as she saw a lot of improvement today during visit. LCSW explained she was seen by Specialist on Call and her mother does not meet criteria for [facility] as her mother is not acutely  Psychotic. Daughter agrees she has seen some improvement.  LCSW reviewed what will take place on Wednesday. Daughter will be providing transportation to [place] for her mom. If the bed is not ready she will take her home until it is. Daughter has prepared her family members and continues to work to get additional things in place for her Mom.  [employer] LCSW [phone]

## 2017-03-13 NOTE — ED Notes (Signed)
Patient voluntary and is pending transport to [facility] on Wednesday.

## 2017-03-13 NOTE — ED Notes (Signed)
Pt's daughter present for 15 minute visitation with pt's permission, escorted by Patient Relations, wanded by security. Pt's daughter asked if she could bring pt's glasses with her later on and was encouraged to do so.

## 2017-03-13 NOTE — ED Notes (Signed)
Pt's daughter brought package of pull-up briefs, wire-rimmed reading glasses, and paperback book for patient. Book (labeled) and glasses given to patient, briefs labeled and added to pt's belongings.

## 2017-03-13 NOTE — ED Notes (Signed)
Pt frequently coming out of room and into hallway. Required multiple redirections back into room. Pt repeatedly asking when she will be placed on the "psych ward."

## 2017-03-13 NOTE — ED Notes (Signed)
BEHAVIORAL HEALTH ROUNDING  Patient sleeping: No.  Patient alert and oriented: yes  Behavior appropriate: Yes. ; If no, describe:  Nutrition and fluids offered: Yes  Toileting and hygiene offered: Yes  Sitter present: not applicable, Q 15 min safety rounds and observation.  Law enforcement present: Yes [employer]  

## 2017-03-14 DIAGNOSIS — F0391 Unspecified dementia with behavioral disturbance: Secondary | ICD-10-CM | POA: Diagnosis not present

## 2017-03-14 MED ORDER — OLANZAPINE 5 MG PO TBDP
5.0000 mg | ORAL_TABLET | ORAL | Status: AC
  Administered 2017-03-14: 5 mg via ORAL
  Filled 2017-03-14: qty 1

## 2017-03-14 NOTE — ED Notes (Signed)
BEHAVIORAL HEALTH ROUNDING Patient sleeping: No. Patient alert and oriented: yes Behavior appropriate: Yes.  ; If no, describe:  Nutrition and fluids offered: yes Toileting and hygiene offered: Yes  Sitter present: q15 minute observations and security  monitoring Law enforcement present: Yes  ODS  

## 2017-03-14 NOTE — ED Provider Notes (Signed)
-----------------------------------------   6:22 AM on 03/14/2017 -----------------------------------------   Blood pressure (!) 148/54, pulse 60, temperature 99 F (37.2 C), temperature source Oral, resp. rate 16, height 1.626 m (5\' 4" ), weight 55.3 kg (122 lb), SpO2 96 %.  The patient had no acute events since last update.  Calm and cooperative at this time.  Pending social work disposition.   Hinda Kehr, MD 03/14/17 [phone]

## 2017-03-14 NOTE — ED Notes (Addendum)
Am meds to be administered as ordered  - assessment completed  She continues to await placement in a long term care facility - social work is involved  Pt verbalizes  "I think it will be okay for me to go somewhere in two days - that will be good"  Per note pt may be considered for placement on Wednesday 03/16/2017 at the [facility]?  Continue to monitor

## 2017-03-14 NOTE — ED Notes (Signed)
Pt is up and walking around. Pt asked if she could get 2 sleeping pills so she can rest. Explained to pt that I had just given her a pill to help her rest and that we need to wait a little longer to see if it works. Assured pt that she could have another pill later if she needed it.

## 2017-03-14 NOTE — ED Notes (Signed)

## 2017-03-14 NOTE — ED Notes (Signed)
Patient observed lying in bed with eyes closed  Even, unlabored respirations observed   NAD pt appears to be sleeping  I will continue to monitor along with every 15 minute visual observations and ongoing security monitoring    

## 2017-03-14 NOTE — ED Notes (Signed)
Her daughter has come for a visit - observed by myself

## 2017-03-14 NOTE — ED Notes (Signed)
Pt out of bed again asking to be covered up again. Ed Therapist, sports in room and recovers pt.

## 2017-03-14 NOTE — ED Notes (Signed)
Pt ambulatory to the bathroom without difficulty. Pt given meds as ordered. Pt requesting a stool softener. Colace is included in the night meds. Pt took without difficulty.

## 2017-03-14 NOTE — ED Notes (Signed)
Pt to the door and asks this RN if I would be able to give her something to sleep. I told pt I would be happy to ask the doctor for her. Pt states she is having a hard time resting. I promised pt I would help her rest tonight.Pt happy with response.

## 2017-03-14 NOTE — ED Notes (Signed)
She has awakened and has ambulated to the BR - she shuffles her feet when she walks but does so quickly and steadily   nad assessed  Continue to monitor

## 2017-03-14 NOTE — ED Notes (Signed)
Pt with increased anxiety - she has ambulated to and from the BR several times and each time she requests her TV station to be changed - within moments of getting her settled she is back up requesting to change the channel again or back to the BR  PRN med to be administered

## 2017-03-14 NOTE — ED Notes (Signed)
ED Is the patient under IVC or is there intent for IVC:  Is the patient medically cleared: Yes.   Is there vacancy in the ED BHU: Yes.   Is the population mix appropriate for patient: Yes.   Is the patient awaiting placement in inpatient or outpatient setting: Yes.   Has the patient had a psychiatric consult:  yes  Survey of unit performed for contraband, proper placement and condition of furniture, tampering with fixtures in bathroom, shower, and each patient room: Yes.  ; Findings:  APPEARANCE/BEHAVIOR Calm and cooperative NEURO ASSESSMENT Orientation: oriented to self   Denies pain Hallucinations: No.None noted (Hallucinations) denies  Speech: Normal Gait: normal - steady shuffle RESPIRATORY ASSESSMENT Even  Unlabored respirations  CARDIOVASCULAR ASSESSMENT Pulses equal   regular rate  Skin warm and dry   GASTROINTESTINAL ASSESSMENT no GI complaint EXTREMITIES Full ROM  PLAN OF CARE Provide calm/safe environment. Vital signs assessed twice daily. ED BHU Assessment once each 12-hour shift. Collaborate with TTS when available or as condition indicates. Assure the ED provider has rounded once each shift. Provide and encourage hygiene. Provide redirection as needed. Assess for escalating behavior; address immediately and inform ED provider.  Assess family dynamic and appropriateness for visitation as needed: Yes.  ; If necessary, describe findings:  Educate the patient/family about BHU procedures/visitation: Yes.  ; If necessary, describe findings:

## 2017-03-14 NOTE — ED Notes (Signed)
Pt ambulatory to toilet to urinate, given new pull up upon request, pt changed without assistance and returned to bed, pt denies further needs att, pt resting comfortably

## 2017-03-15 DIAGNOSIS — F0391 Unspecified dementia with behavioral disturbance: Secondary | ICD-10-CM | POA: Diagnosis not present

## 2017-03-15 NOTE — ED Notes (Addendum)
Attempted to wake pt, pt does not want to eat at this time. Meal tray placed in chair beside pt bed.

## 2017-03-15 NOTE — ED Notes (Signed)
Pt up to restroom without complication. Denies needs.

## 2017-03-15 NOTE — ED Notes (Signed)
Pt taken around ED in a wheelchair by Jonni Sanger, EDT due to patient agitation. MD aware and states okay at this time.

## 2017-03-15 NOTE — ED Provider Notes (Signed)
-----------------------------------------   7:14 AM on 03/15/2017 -----------------------------------------   Blood pressure (!) 162/87, pulse 72, temperature 99 F (37.2 C), temperature source Oral, resp. rate 16, height 5\' 4"  (1.626 m), weight 55.3 kg (122 lb), SpO2 96 %.  The patient had no acute events since last update.  Calm and cooperative at this time.  Disposition is pending clinical social work recommendations.     Paulette Blanch, MD 03/15/17 [phone]

## 2017-03-15 NOTE — ED Notes (Signed)
Pt had supervised visit with her daughter. Pt now repeatedly asking for medications to make her sleep. Pt redirected back to bed at this time.

## 2017-03-15 NOTE — ED Notes (Signed)
Pt requesting to have bandage taken off of her L hand where a skin tear is noted. MD made aware. Xeroform gauze placed over skin tear, appears to be healing, no new bleeding noted at this time. Pt's hand wrapped for patient comfort. MD aware.

## 2017-03-15 NOTE — ED Notes (Signed)
Patient to the door repeatedly asking for something to help her sleep. This RN explained too earlier to give her something to help her sleep, would check night time medications to see what patient had ordered to help her sleep.

## 2017-03-15 NOTE — ED Notes (Signed)
Patient is voluntary and is pending placement to the [facility].

## 2017-03-15 NOTE — ED Notes (Signed)
Pt noted to be repeatedly getting up and down, requesting something to help her relax. Pt states she thinks she is unable to stay in bed. Pt is noted to be pleasant and easily redirected to bed, repeatedly requesting to be recovered with blankets then getting up and out of bed. Will continue to monitor patient at this time.

## 2017-03-15 NOTE — ED Notes (Signed)
Pt repeatedly to door requesting meds for sleep. Requesting them at 8pm

## 2017-03-16 ENCOUNTER — Ambulatory Visit: Payer: Self-pay | Admitting: Unknown Physician Specialty

## 2017-03-16 DIAGNOSIS — F0391 Unspecified dementia with behavioral disturbance: Secondary | ICD-10-CM | POA: Diagnosis not present

## 2017-03-16 MED ORDER — LEVOTHYROXINE SODIUM 125 MCG PO TABS: 125.0000 ug | ORAL_TABLET | Freq: Every day | ORAL | 0 refills | Status: DC

## 2017-03-16 MED ORDER — TRAMADOL HCL 50 MG PO TABS: 50.0000 mg | ORAL_TABLET | Freq: Two times a day (BID) | ORAL | 0 refills | Status: DC | PRN

## 2017-03-16 MED ORDER — OLANZAPINE 10 MG PO TABS: 5.0000 mg | ORAL_TABLET | Freq: Two times a day (BID) | ORAL | 0 refills | Status: DC

## 2017-03-16 MED ORDER — SERTRALINE HCL 50 MG PO TABS: 50.0000 mg | ORAL_TABLET | Freq: Every day | ORAL | 0 refills | Status: AC

## 2017-03-16 MED ORDER — ALBUTEROL SULFATE HFA 108 (90 BASE) MCG/ACT IN AERS: 2.0000 | INHALATION_SPRAY | Freq: Four times a day (QID) | RESPIRATORY_TRACT | 0 refills | Status: AC | PRN

## 2017-03-16 MED ORDER — DIVALPROEX SODIUM ER 250 MG PO TB24: 250.0000 mg | ORAL_TABLET | Freq: Every day | ORAL | 0 refills | Status: AC

## 2017-03-16 MED ORDER — METOPROLOL TARTRATE 25 MG PO TABS: 12.5000 mg | ORAL_TABLET | Freq: Two times a day (BID) | ORAL | 0 refills | Status: AC

## 2017-03-16 MED ORDER — FLUTICASONE-SALMETEROL 250-50 MCG/DOSE IN AEPB: 1.0000 | INHALATION_SPRAY | Freq: Two times a day (BID) | RESPIRATORY_TRACT | 0 refills | Status: AC

## 2017-03-16 MED ORDER — FLUTICASONE PROPIONATE 50 MCG/ACT NA SUSP: 2.0000 | Freq: Every day | NASAL | 0 refills | Status: AC

## 2017-03-16 MED ORDER — LORAZEPAM 0.5 MG PO TABS: 0.5000 mg | ORAL_TABLET | Freq: Two times a day (BID) | ORAL | 0 refills | Status: DC | PRN

## 2017-03-16 MED ORDER — FUROSEMIDE 20 MG PO TABS: 20.0000 mg | ORAL_TABLET | ORAL | 0 refills | Status: AC

## 2017-03-16 MED ORDER — GABAPENTIN 100 MG PO CAPS: 200.0000 mg | ORAL_CAPSULE | Freq: Three times a day (TID) | ORAL | 0 refills | Status: AC

## 2017-03-16 MED ORDER — POTASSIUM CHLORIDE CRYS ER 10 MEQ PO TBCR: 10.0000 meq | EXTENDED_RELEASE_TABLET | ORAL | 0 refills | Status: AC

## 2017-03-16 MED ORDER — MEMANTINE HCL 5 MG PO TABS: 5.0000 mg | ORAL_TABLET | Freq: Two times a day (BID) | ORAL | 0 refills | Status: AC

## 2017-03-16 NOTE — ED Notes (Signed)
Patient observed lying in bed with eyes closed  Even, unlabored respirations observed   NAD pt appears to be sleeping  I will continue to monitor along with every 15 minute visual observations and ongoing security monitoring    

## 2017-03-16 NOTE — Progress Notes (Signed)
Clinical Education officer, museum (CSW) received a call from Optician, dispensing from [facility] who stated that they have not accepted patient and they are in the process of reviewing referral. Per Rachel Bo he will send a nurse out to assess patient in the ED within 1 hour. CSW made Amy nurse in the ED aware of above.   [employer], LCSW [phone]

## 2017-03-16 NOTE — ED Notes (Signed)
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
## 2017-03-16 NOTE — ED Notes (Signed)
Pr provided new pull up by this tech

## 2017-03-16 NOTE — ED Provider Notes (Signed)
-----------------------------------------   7:57 AM on 03/16/2017 -----------------------------------------   Blood pressure 130/68, pulse (!) 53, temperature 98.6 F (37 C), temperature source Oral, resp. rate 18, height 5\' 4"  (1.626 m), weight 55.3 kg (122 lb), SpO2 96 %.  The patient had no acute events since last update.  Patient is here voluntarily, possible placement today to the [facility], currently awaiting confirmation.   Harvest Dark, MD 03/16/17 [phone]

## 2017-03-16 NOTE — ED Notes (Signed)
She has ambulated to and from the BR with a shuffling but steady gait  Introduced myself to her - NAD observed

## 2017-03-16 NOTE — ED Notes (Signed)
Pt up using restroom without assistance, no further needs from pt at this time

## 2017-03-16 NOTE — ED Notes (Signed)
BEHAVIORAL HEALTH ROUNDING Patient sleeping: YES Patient alert and oriented: SLEEPING Behavior appropriate: SLEEPING Nutrition and fluids offered: SLEEPING Toileting and hygiene offered: SLEEPING Sitter present: YES Event organiser present: YES

## 2017-03-16 NOTE — ED Notes (Signed)
ED Is the patient under IVC or is there intent for IVC: no Is the patient medically cleared: Yes.   Is there vacancy in the ED BHU: Yes.   Is the population mix appropriate for patient:  no Is the patient awaiting placement in inpatient or outpatient setting:  No  Long term placement or to live with family Has the patient had a psychiatric consult:  Survey of unit performed for contraband, proper placement and condition of furniture, tampering with fixtures in bathroom, shower, and each patient room: Yes.  ; Findings:  APPEARANCE/BEHAVIOR Calm and cooperative NEURO ASSESSMENT Orientation: oriented x3  Denies pain Hallucinations: No.None noted (Hallucinations) Speech: Normal Gait: normal RESPIRATORY ASSESSMENT Even  Unlabored respirations  CARDIOVASCULAR ASSESSMENT Pulses equal   regular rate  Skin warm and dry   GASTROINTESTINAL ASSESSMENT no GI complaint EXTREMITIES Full ROM  PLAN OF CARE Provide calm/safe environment. Vital signs assessed twice daily. ED BHU Assessment once each 12-hour shift. Collaborate with TTS when available or as condition indicates. Assure the ED provider has rounded once each shift. Provide and encourage hygiene. Provide redirection as needed. Assess for escalating behavior; address immediately and inform ED provider.  Assess family dynamic and appropriateness for visitation as needed: Yes.  ; If necessary, describe findings:  Educate the patient/family about BHU procedures/visitation: Yes.  ; If necessary, describe findings:

## 2017-03-16 NOTE — ED Notes (Signed)
Someone from the [facility] has come by for a visit and to assess if she is appropriate for there facility  Pt observed in NAD

## 2017-03-16 NOTE — ED Provider Notes (Signed)
-----------------------------------------   10:38 AM on 03/16/2017 -----------------------------------------  [facility] has reviewed the patient's packet and unfortunately is not able to accept the patient to their facility.  The daughter is now at the emergency department and she wishes to take the patient home and continue her search for a facility from home.  I believe this is a reasonable plan of care given the unlikelihood of ER placement short-term.  We will discharge with a 30-day prescription of her medications into the daughter's care.   Harvest Dark, MD 03/16/17 [id number]

## 2017-03-16 NOTE — ED Notes (Signed)
Am meds administered as ordered - assessment completed  Pt continues to await facility placement or to return home with family - social work consult is ongoing

## 2017-03-16 NOTE — Progress Notes (Signed)
Clinical Education officer, museum (CSW) contacted [facility] today and spoke to resident care Editor, commissioning. Per [employer] she does not know anything about this patient and has not reviewed the referral. Per Amber she will have to speak Dustin the Scientist, physiological when he gets in this morning. CSW faxed referral to [employer].   [employer], LCSW [phone]

## 2017-03-16 NOTE — ED Notes (Signed)
BEHAVIORAL HEALTH ROUNDING Patient sleeping: No. Patient alert and oriented: yes Behavior appropriate: Yes.  ; If no, describe:  Nutrition and fluids offered: yes Toileting and hygiene offered: Yes  Sitter present: q15 minute observations and security  monitoring Law enforcement present: Yes  ODS  

## 2017-03-18 ENCOUNTER — Telehealth: Payer: Self-pay

## 2017-03-18 NOTE — Telephone Encounter (Signed)
I have made the 1st attempt to contact the patient or family member in charge, in order to follow up from recently being discharged from the hospital. I left a message on voicemail but I will make another attempt at a different time.  

## 2017-03-21 ENCOUNTER — Emergency Department: Payer: Medicare Other

## 2017-03-21 ENCOUNTER — Emergency Department
Admission: EM | Admit: 2017-03-21 | Discharge: 2017-03-25 | Disposition: A | Discharge status: Home / Self Care | Payer: Medicare Other | Attending: Emergency Medicine | Admitting: Emergency Medicine

## 2017-03-21 DIAGNOSIS — F0281 Dementia in other diseases classified elsewhere with behavioral disturbance: Secondary | ICD-10-CM | POA: Diagnosis not present

## 2017-03-21 DIAGNOSIS — F250 Schizoaffective disorder, bipolar type: Secondary | ICD-10-CM | POA: Diagnosis not present

## 2017-03-21 DIAGNOSIS — F0280 Dementia in other diseases classified elsewhere without behavioral disturbance: Secondary | ICD-10-CM | POA: Diagnosis present

## 2017-03-21 DIAGNOSIS — M25571 Pain in right ankle and joints of right foot: Secondary | ICD-10-CM | POA: Diagnosis not present

## 2017-03-21 DIAGNOSIS — E119 Type 2 diabetes mellitus without complications: Secondary | ICD-10-CM | POA: Diagnosis not present

## 2017-03-21 DIAGNOSIS — Z950 Presence of cardiac pacemaker: Secondary | ICD-10-CM | POA: Insufficient documentation

## 2017-03-21 DIAGNOSIS — E039 Hypothyroidism, unspecified: Secondary | ICD-10-CM | POA: Diagnosis not present

## 2017-03-21 DIAGNOSIS — F205 Residual schizophrenia: Secondary | ICD-10-CM | POA: Diagnosis present

## 2017-03-21 DIAGNOSIS — Z79899 Other long term (current) drug therapy: Secondary | ICD-10-CM | POA: Diagnosis not present

## 2017-03-21 DIAGNOSIS — G309 Alzheimer's disease, unspecified: Secondary | ICD-10-CM | POA: Insufficient documentation

## 2017-03-21 DIAGNOSIS — J45909 Unspecified asthma, uncomplicated: Secondary | ICD-10-CM | POA: Diagnosis not present

## 2017-03-21 DIAGNOSIS — M25579 Pain in unspecified ankle and joints of unspecified foot: Secondary | ICD-10-CM | POA: Diagnosis not present

## 2017-03-21 DIAGNOSIS — F259 Schizoaffective disorder, unspecified: Secondary | ICD-10-CM | POA: Diagnosis present

## 2017-03-21 LAB — CBC WITH DIFFERENTIAL/PLATELET
BASOS PCT: 1 %
Basophils Absolute: 0.1 10*3/uL (ref 0–0.1)
Eosinophils Absolute: 0 10*3/uL (ref 0–0.7)
Eosinophils Relative: 0 %
HEMATOCRIT: 43.1 % (ref 35.0–47.0)
HEMOGLOBIN: 14.3 g/dL (ref 12.0–16.0)
LYMPHS ABS: 1.2 10*3/uL (ref 1.0–3.6)
LYMPHS PCT: 7 %
MCH: 29.4 pg (ref 26.0–34.0)
MCHC: 33.2 g/dL (ref 32.0–36.0)
MCV: 88.8 fL (ref 80.0–100.0)
MONO ABS: 0.9 10*3/uL (ref 0.2–0.9)
MONOS PCT: 6 %
NEUTROS ABS: 13.9 10*3/uL — AB (ref 1.4–6.5)
NEUTROS PCT: 86 %
Platelets: 186 10*3/uL (ref 150–440)
RBC: 4.86 MIL/uL (ref 3.80–5.20)
RDW: 14.3 % (ref 11.5–14.5)
WBC: 16.2 10*3/uL — ABNORMAL HIGH (ref 3.6–11.0)

## 2017-03-21 LAB — COMPREHENSIVE METABOLIC PANEL
ALBUMIN: 3.8 g/dL (ref 3.5–5.0)
ALK PHOS: 88 U/L (ref 38–126)
ALT: 29 U/L (ref 14–54)
ANION GAP: 11 (ref 5–15)
AST: 54 U/L — ABNORMAL HIGH (ref 15–41)
BILIRUBIN TOTAL: 1.1 mg/dL (ref 0.3–1.2)
BUN: 60 mg/dL — ABNORMAL HIGH (ref 6–20)
CALCIUM: 9.4 mg/dL (ref 8.9–10.3)
CO2: 27 mmol/L (ref 22–32)
Chloride: 99 mmol/L — ABNORMAL LOW (ref 101–111)
Creatinine, Ser: 1.25 mg/dL — ABNORMAL HIGH (ref 0.44–1.00)
GFR calc Af Amer: 45 mL/min — ABNORMAL LOW (ref >60)
GFR, EST NON AFRICAN AMERICAN: 39 mL/min — AB (ref >60)
GLUCOSE: 160 mg/dL — AB (ref 65–99)
Potassium: 4.1 mmol/L (ref 3.5–5.1)
Sodium: 137 mmol/L (ref 135–145)
TOTAL PROTEIN: 8.2 g/dL — AB (ref 6.5–8.1)

## 2017-03-21 MED ORDER — GABAPENTIN 100 MG PO CAPS
200.0000 mg | ORAL_CAPSULE | Freq: Three times a day (TID) | ORAL | Status: DC
  Administered 2017-03-21 – 2017-03-25 (×8): 200 mg via ORAL
  Filled 2017-03-21 (×17): qty 2

## 2017-03-21 MED ORDER — OLANZAPINE 5 MG PO TABS
5.0000 mg | ORAL_TABLET | Freq: Every day | ORAL | Status: DC
  Administered 2017-03-22: 5 mg via ORAL
  Filled 2017-03-21: qty 1

## 2017-03-21 MED ORDER — SERTRALINE HCL 50 MG PO TABS
50.0000 mg | ORAL_TABLET | Freq: Every day | ORAL | Status: DC
  Administered 2017-03-21 – 2017-03-24 (×4): 50 mg via ORAL
  Filled 2017-03-21 (×4): qty 1

## 2017-03-21 MED ORDER — HALOPERIDOL LACTATE 5 MG/ML IJ SOLN
5.0000 mg | Freq: Once | INTRAMUSCULAR | Status: AC
  Administered 2017-03-21: 5 mg via INTRAMUSCULAR
  Filled 2017-03-21: qty 1

## 2017-03-21 MED ORDER — FUROSEMIDE 40 MG PO TABS
20.0000 mg | ORAL_TABLET | ORAL | Status: DC
  Administered 2017-03-21 – 2017-03-25 (×3): 20 mg via ORAL
  Filled 2017-03-21 (×3): qty 1

## 2017-03-21 MED ORDER — LORAZEPAM 2 MG/ML IJ SOLN
2.0000 mg | Freq: Once | INTRAMUSCULAR | Status: AC
  Administered 2017-03-21: 2 mg via INTRAMUSCULAR
  Filled 2017-03-21: qty 1

## 2017-03-21 MED ORDER — DIVALPROEX SODIUM ER 250 MG PO TB24
250.0000 mg | ORAL_TABLET | Freq: Every day | ORAL | Status: DC
  Administered 2017-03-21 – 2017-03-22 (×2): 250 mg via ORAL
  Filled 2017-03-21 (×2): qty 1

## 2017-03-21 MED ORDER — OLANZAPINE 5 MG PO TABS
2.5000 mg | ORAL_TABLET | Freq: Once | ORAL | Status: AC
  Administered 2017-03-21: 2.5 mg via ORAL
  Filled 2017-03-21: qty 1

## 2017-03-21 MED ORDER — METOPROLOL TARTRATE 25 MG PO TABS
12.5000 mg | ORAL_TABLET | Freq: Two times a day (BID) | ORAL | Status: DC
  Administered 2017-03-21 – 2017-03-25 (×6): 12.5 mg via ORAL
  Filled 2017-03-21 (×7): qty 1

## 2017-03-21 MED ORDER — MEMANTINE HCL 5 MG PO TABS
5.0000 mg | ORAL_TABLET | Freq: Two times a day (BID) | ORAL | Status: DC
  Administered 2017-03-21 – 2017-03-25 (×7): 5 mg via ORAL
  Filled 2017-03-21 (×7): qty 1

## 2017-03-21 MED ORDER — LEVOTHYROXINE SODIUM 50 MCG PO TABS
125.0000 ug | ORAL_TABLET | Freq: Every day | ORAL | Status: DC
  Administered 2017-03-22 – 2017-03-25 (×3): 125 ug via ORAL
  Filled 2017-03-21 (×3): qty 3

## 2017-03-21 MED ORDER — OLANZAPINE 10 MG PO TABS
10.0000 mg | ORAL_TABLET | Freq: Every day | ORAL | Status: DC
  Administered 2017-03-21 – 2017-03-24 (×4): 10 mg via ORAL
  Filled 2017-03-21 (×5): qty 1

## 2017-03-21 MED ORDER — OLANZAPINE 5 MG PO TABS
5.0000 mg | ORAL_TABLET | Freq: Two times a day (BID) | ORAL | Status: DC
  Filled 2017-03-21: qty 1

## 2017-03-21 NOTE — ED Notes (Signed)
Safety sitter remains at bedside.
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
## 2017-03-21 NOTE — ED Notes (Signed)
Pt is aggrevated and yelling at staff. Pt is demanding to see psych and trying to get out of bed. Pt is stating she wants to leave and live somewhere else.

## 2017-03-21 NOTE — ED Notes (Signed)
Pt screaming yelling wanting to se MD NOW!

## 2017-03-21 NOTE — ED Notes (Signed)
Report given to [facility]

## 2017-03-21 NOTE — ED Notes (Signed)
Pt requested to go to bathroom this Rn attempted to tale pt to bathroom. Pt started yelling and screaming for the psch doctor, Rn informed pt that doc will be around when he can. RN then got assistance from [employer] and Stanton Kidney Rn to assist.

## 2017-03-21 NOTE — ED Notes (Signed)
Pt is requesting to move out of home pt wants the nursing home.

## 2017-03-21 NOTE — ED Notes (Signed)
Patient transported to XR. 

## 2017-03-21 NOTE — ED Notes (Signed)
Dr. Weber Cooks and Ed sitter at bedside.

## 2017-03-21 NOTE — ED Notes (Signed)
This Rn received call from [place] at [place].

## 2017-03-21 NOTE — ED Notes (Signed)
Patient was assisted to the room commode by ED tech.

## 2017-03-22 DIAGNOSIS — F250 Schizoaffective disorder, bipolar type: Secondary | ICD-10-CM | POA: Diagnosis not present

## 2017-03-22 DIAGNOSIS — G309 Alzheimer's disease, unspecified: Secondary | ICD-10-CM | POA: Diagnosis not present

## 2017-03-22 MED ORDER — DIVALPROEX SODIUM ER 250 MG PO TB24
250.0000 mg | ORAL_TABLET | Freq: Every day | ORAL | Status: DC
  Administered 2017-03-23 – 2017-03-24 (×2): 250 mg via ORAL
  Filled 2017-03-22 (×2): qty 1

## 2017-03-22 MED ORDER — OLANZAPINE 5 MG PO TABS
2.5000 mg | ORAL_TABLET | Freq: Every day | ORAL | Status: DC
  Administered 2017-03-23 – 2017-03-25 (×2): 2.5 mg via ORAL
  Filled 2017-03-22 (×2): qty 1

## 2017-03-22 NOTE — Consult Note (Signed)
[facility] Face-to-Face Psychiatry Consult   Reason for Consult: Consult follow-up 84 year old woman with a history of schizoaffective disorder Referring Physician: McShane Patient Identification: Jill Copeland MRN:  [id number] Principal Diagnosis: Schizoaffective disorder [facility]) Diagnosis:   Patient Active Problem List   Diagnosis Date Noted  . Asthma [J45.909] 03/11/2017  . Pain management [R52] 03/11/2017  . Advanced care planning/counseling discussion [Z71.89] 11/19/2016  . Impaired mobility and ADLs [Z74.09] 11/19/2016  . Adventitious breath sounds [R06.89] 11/19/2016  . Pacemaker infection ([place]) [T82.7XXA] 10/11/2016  . Endocarditis [I38] 10/09/2016  . Schizoaffective disorder ([place]) [F25.9]   . Pressure ulcer [L89.90] 01/13/2015  . Multiple falls [R29.6] 01/12/2015  . Overactive bladder [N32.81] 12/30/2014  . Alzheimer disease [G30.9, F02.80]   . Schizo affective schizophrenia ([place]) [F25.0]   . Recurrent pneumonia [J18.9]   . Dysphagia causing pulmonary aspiration with swallowing [R13.19]   . Pneumococcal pneumonia ([place]) [J13]   . Arrhythmia [I49.9]   . Esophageal stricture [K22.2]   . Hypothyroidism [E03.9]   . Lung nodule [R91.1]   . Dementia in chronic schizophrenia ([place]) [F20.5] 08/12/2014  . Social discord [Z65.8] 08/12/2014  . Family conflict [Q11.9] 41/74/0814    Total Time spent with patient: 45 minutes  Subjective:   Jill Copeland is a 84 y.o. female patient admitted with "please help me".  HPI: See note from yesterday.  This is a follow-up for this 84 year old woman with a history of chronic mental illness who was brought to the emergency room by her family.  She was showing some signs possibly consistent with mania yesterday.  Daughter was refusing to take her back in her current condition.  I had started her on Zyprexa last night.  On reevaluation today I found the patient probably overly sleepy in the morning.  Less verbal.  Still dysphoric.  Still a little  confused.  No new physical complaints  Past Psychiatric History: Long history of chronic mental illness multiple prior hospitalizations.  Now also probably with some degree of dementia  Risk to Self: Is patient at risk for suicide?: No Risk to Others:   Prior Inpatient Therapy:   Prior Outpatient Therapy:    Past Medical History:  Past Medical History:  Diagnosis Date  . Alzheimer disease   . Arthritis   . Bacteremia    a. 09/2016 - admit w/ S hominis bacteremia w/ ? of pacer lead vegetation;  b. 09/2016 TEE:  unable to fully pass TEE probe due to esoph stricture; c. IV Abx per ID.  . Diabetes mellitus without complication ([place])   . Dysphagia causing pulmonary aspiration with swallowing   . Esophageal stricture    a. @ site of anastamosis from hiatal hernia surgery.  Marland Kitchen GERD (gastroesophageal reflux disease)   . Hiatal hernia    a. s/p prior repair with fistula;  b. 09/2016 CT chest: moderate to large hiatal hernia - stable.  . Hypothyroidism   . Pneumococcal pneumonia ([place])   . Recurrent pneumonia   . Recurrent UTI   . Schizo affective schizophrenia ([place])   . Stroke (cerebrum) ([place])   . Symptomatic bradycardia    a. s/p biotronik PPM 02/2005 at [facility] in [place], [place]  . Syncope    a. 02/2005 s/p Biotronik DC PPM @ [facility] in [place], [place].    Past Surgical History:  Procedure Laterality Date  . HERNIA REPAIR     hiatal  . JEJUNOSTOMY FEEDING TUBE    . PACEMAKER INSERTION    . TEE WITHOUT  CARDIOVERSION N/A 10/11/2016   Procedure: TRANSESOPHAGEAL ECHOCARDIOGRAM (TEE);  Surgeon: Wellington Hampshire, MD;  Location: [facility] ORS;  Service: Cardiovascular;  Laterality: N/A;   Family History:  Family History  Problem Relation Age of Onset  . Thyroid cancer Grandchild   . Thyroid nodules Daughter   . Thyroid cancer Daughter   . Graves' disease Daughter   . Dementia Mother   . Schizophrenia Maternal Aunt   . Thyroid disease Son   . Cancer Son   . Cancer Brother    . Cancer Brother    Family Psychiatric  History: Unknown Social History:  Social History   Substance and Sexual Activity  Alcohol Use No  . Alcohol/week: 0.0 oz     Social History   Substance and Sexual Activity  Drug Use No    Social History   Socioeconomic History  . Marital status: Widowed    Spouse name: None  . Number of children: None  . Years of education: None  . Highest education level: None  Social Needs  . Financial resource strain: None  . Food insecurity - worry: None  . Food insecurity - inability: None  . Transportation needs - medical: None  . Transportation needs - non-medical: None  Occupational History  . None  Tobacco Use  . Smoking status: Never Smoker  . Smokeless tobacco: Never Used  Substance and Sexual Activity  . Alcohol use: No    Alcohol/week: 0.0 oz  . Drug use: No  . Sexual activity: None  Other Topics Concern  . None  Social History Narrative  . None   Additional Social History:    Allergies:    Labs:  Results for orders placed or performed during the hospital encounter of 03/21/17 (from the past 48 hour(s))  CBC with Differential     Status: Abnormal   Collection Time: 03/21/17  3:39 PM  Result Value Ref Range   WBC 16.2 (H) 3.6 - 11.0 K/uL   RBC 4.86 3.80 - 5.20 MIL/uL   Hemoglobin 14.3 12.0 - 16.0 g/dL   HCT 43.1 35.0 - 47.0 %   MCV 88.8 80.0 - 100.0 fL   MCH 29.4 26.0 - 34.0 pg   MCHC 33.2 32.0 - 36.0 g/dL   RDW 14.3 11.5 - 14.5 %   Platelets 186 150 - 440 K/uL   Neutrophils Relative % 86 %   Neutro Abs 13.9 (H) 1.4 - 6.5 K/uL   Lymphocytes Relative 7 %   Lymphs Abs 1.2 1.0 - 3.6 K/uL   Monocytes Relative 6 %   Monocytes Absolute 0.9 0.2 - 0.9 K/uL   Eosinophils Relative 0 %   Eosinophils Absolute 0.0 0 - 0.7 K/uL   Basophils Relative 1 %   Basophils Absolute 0.1 0 - 0.1 K/uL    Comment: Performed at [facility], [address] [place], [place] [address]  Comprehensive metabolic panel      Status: Abnormal   Collection Time: 03/21/17  8:30 PM  Result Value Ref Range   Sodium 137 135 - 145 mmol/L   Potassium 4.1 3.5 - 5.1 mmol/L   Chloride 99 (L) 101 - 111 mmol/L   CO2 27 22 - 32 mmol/L   Glucose, Bld 160 (H) 65 - 99 mg/dL   BUN 60 (H) 6 - 20 mg/dL   Creatinine, Ser 1.25 (H) 0.44 - 1.00 mg/dL   Calcium 9.4 8.9 - 10.3 mg/dL   Total Protein 8.2 (H) 6.5 - 8.1 g/dL  Albumin 3.8 3.5 - 5.0 g/dL   AST 54 (H) 15 - 41 U/L   ALT 29 14 - 54 U/L   Alkaline Phosphatase 88 38 - 126 U/L   Total Bilirubin 1.1 0.3 - 1.2 mg/dL   GFR calc non Af Amer 39 (L) >60 mL/min   GFR calc Af Amer 45 (L) >60 mL/min    Comment: (NOTE) The eGFR has been calculated using the CKD EPI equation. This calculation has not been validated in all clinical situations. eGFR's persistently <60 mL/min signify possible Chronic Kidney Disease.    Anion gap 11 5 - 15    Comment: Performed at [facility], [address] [place],  [address]    Current Facility-Administered Medications  Medication Dose Route Frequency Provider Last Rate Last Dose  . [START ON 03/23/2017] divalproex (DEPAKOTE ER) 24 hr tablet 250 mg  250 mg Oral QHS Loman Logan T, MD      . furosemide (LASIX) tablet 20 mg  20 mg Oral Cloria Spring, MD   20 mg at 03/21/17 1954  . gabapentin (NEURONTIN) capsule 200 mg  200 mg Oral TID Darel Hong, MD   200 mg at 03/22/17 1002  . levothyroxine (SYNTHROID, LEVOTHROID) tablet 125 mcg  125 mcg Oral QAC breakfast Darel Hong, MD   125 mcg at 03/22/17 0851  . memantine (NAMENDA) tablet 5 mg  5 mg Oral BID Darel Hong, MD   5 mg at 03/22/17 1002  . metoprolol tartrate (LOPRESSOR) tablet 12.5 mg  12.5 mg Oral BID Darel Hong, MD   12.5 mg at 03/22/17 1003  . OLANZapine (ZYPREXA) tablet 10 mg  10 mg Oral QHS Kylah Maresh, Madie Reno, MD   10 mg at 03/21/17 2233  . [START ON 03/23/2017] OLANZapine (ZYPREXA) tablet 2.5 mg  2.5 mg Oral Daily Kallen Mccrystal T, MD      . sertraline  (ZOLOFT) tablet 50 mg  50 mg Oral QHS Darel Hong, MD   50 mg at 03/21/17 2234   Current Outpatient Medications  Medication Sig Dispense Refill  . albuterol (PROVENTIL HFA;VENTOLIN HFA) 108 (90 Base) MCG/ACT inhaler Inhale 2 puffs into the lungs every 6 (six) hours as needed for wheezing or shortness of breath. 1 Inhaler 0  . Cholecalciferol 2000 units TABS Take 2,000 Units by mouth daily.    . divalproex (DEPAKOTE ER) 250 MG 24 hr tablet Take 1 tablet (250 mg total) by mouth daily. 30 tablet 0  . docusate sodium (COLACE) 100 MG capsule Take 100 mg by mouth 2 (two) times daily.    . ferrous sulfate 325 (65 FE) MG tablet Take 325 mg by mouth daily with breakfast.    . fluticasone (FLONASE) 50 MCG/ACT nasal spray Place 2 sprays into both nostrils daily. 16 g 0  . Fluticasone-Salmeterol (ADVAIR DISKUS) 250-50 MCG/DOSE AEPB Inhale 1 puff into the lungs 2 (two) times daily. 60 each 0  . furosemide (LASIX) 20 MG tablet Take 1 tablet (20 mg total) by mouth every other day. 30 tablet 0  . gabapentin (NEURONTIN) 100 MG capsule Take 2 capsules (200 mg total) by mouth 3 (three) times daily. 90 capsule 0  . levothyroxine (SYNTHROID, LEVOTHROID) 125 MCG tablet Take 1 tablet (125 mcg total) by mouth daily before breakfast. 30 tablet 0  . loperamide (IMODIUM) 2 MG capsule Take 2 mg by mouth as needed for diarrhea or loose stools.    Marland Kitchen loratadine (CLARITIN) 10 MG tablet Take 10 mg by mouth daily.    Marland Kitchen  LORazepam (ATIVAN) 0.5 MG tablet Take 1 tablet (0.5 mg total) by mouth 2 (two) times daily as needed for anxiety. 10 tablet 0  . Melatonin 3 MG TABS Take 3 mg by mouth at bedtime.    . memantine (NAMENDA) 5 MG tablet Take 1 tablet (5 mg total) by mouth 2 (two) times daily. 60 tablet 0  . metoprolol tartrate (LOPRESSOR) 25 MG tablet Take 0.5 tablets (12.5 mg total) by mouth 2 (two) times daily. 60 tablet 0  . OLANZapine (ZYPREXA) 10 MG tablet Take 0.5 tablets (5 mg total) by mouth 2 (two) times daily. 60 tablet  0  . ondansetron (ZOFRAN) 8 MG tablet Take 1 tablet (8 mg total) by mouth every 8 (eight) hours as needed for nausea or vomiting. 90 tablet 0  . potassium chloride (K-DUR,KLOR-CON) 10 MEQ tablet Take 1 tablet (10 mEq total) by mouth every other day. 30 tablet 0  . senna (SENOKOT) 8.6 MG TABS tablet Take 1 tablet by mouth 2 (two) times a week. Mondays and Fridays    . sertraline (ZOLOFT) 50 MG tablet Take 1 tablet (50 mg total) by mouth at bedtime. 30 tablet 0  . sodium chloride 1 G tablet Take 1 g by mouth 3 (three) times daily.    . traMADol (ULTRAM) 50 MG tablet Take 1 tablet (50 mg total) by mouth 2 (two) times daily as needed for moderate pain. 10 tablet 0  . saccharomyces boulardii (FLORASTOR) 250 MG capsule Take 1 capsule (250 mg total) by mouth 2 (two) times daily. (Patient not taking: Reported on 03/21/2017) 30 capsule 0    Musculoskeletal: Strength & Muscle Tone: within normal limits Gait & Station: normal Patient leans: N/A  Psychiatric Specialty Exam: Physical Exam  Nursing note and vitals reviewed. Constitutional: She appears well-developed and well-nourished.  HENT:  Head: Normocephalic and atraumatic.  Eyes: Conjunctivae are normal. Pupils are equal, round, and reactive to light.  Neck: Normal range of motion.  Cardiovascular: Regular rhythm and normal heart sounds.  Respiratory: Effort normal. No respiratory distress.  GI: Soft.  Musculoskeletal: Normal range of motion.  Neurological: She is alert.  Skin: Skin is warm and dry.  Psychiatric: Her mood appears anxious. Her affect is labile. Her speech is tangential. She is agitated. She is not aggressive. Thought content is paranoid. Cognition and memory are impaired. She expresses impulsivity. She expresses no homicidal and no suicidal ideation.    Review of Systems  Constitutional: Negative.   HENT: Negative.   Eyes: Negative.   Respiratory: Negative.   Cardiovascular: Negative.   Gastrointestinal: Negative.    Musculoskeletal: Negative.   Skin: Negative.   Neurological: Negative.   Psychiatric/Behavioral: Negative for depression, hallucinations, memory loss, substance abuse and suicidal ideas. The patient is nervous/anxious. The patient does not have insomnia.     Blood pressure (!) 130/50, pulse 68, temperature 97.6 F (36.4 C), temperature source Oral, resp. rate 17, SpO2 95 %.There is no height or weight on file to calculate BMI.  General Appearance: Disheveled  Eye Contact:  Fair  Speech:  Slow  Volume:  Decreased  Mood:  Dysphoric  Affect:  Constricted  Thought Process:  Disorganized  Orientation:  Full (Time, Place, and Person)  Thought Content:  Paranoid Ideation  Suicidal Thoughts:  No  Homicidal Thoughts:  No  Memory:  Immediate;   Fair Recent;   Fair Remote;   Fair  Judgement:  Impaired  Insight:  Shallow  Psychomotor Activity:  Decreased  Concentration:  Concentration: Poor  Recall:  Smiley Houseman of Knowledge:  Fair  Language:  Fair  Akathisia:  No  Handed:  Right  AIMS (if indicated):     Assets:  Desire for Improvement Physical Health Resilience  ADL's:  Impaired  Cognition:  Impaired,  Mild  Sleep:        Treatment Plan Summary: Daily contact with patient to assess and evaluate symptoms and progress in treatment, Medication management and Plan Patient interviewed chart reviewed spoke with social work today.  We are pursuing a couple of prongs with this patient.  Social work has contacted a group home to evaluate the patient.  If they are willing to take her I think she probably could be managed in that setting with medication.  On the other hand if this does not prove to be possible I think that geropsychiatry would also be a reasonable option.  Obviously on the holiday today we are unlikely to make much progress with placement.  I do think she is a little sedated today and I am going to cut down the morning Zyprexa dose to 2-1/2 mg and also change her Depakote to  nighttime.  Continue following in the ER.  Disposition: No evidence of imminent risk to self or others at present.   Supportive therapy provided about ongoing stressors.  Alethia Berthold, MD 03/22/2017 2:30 PM

## 2017-03-22 NOTE — NC FL2 (Addendum)
[place] LEVEL OF CARE SCREENING TOOL     IDENTIFICATION  Patient Name: Jill Copeland Birthdate: 1932-02-22 Sex: female Admission Date (Current Location): 03/21/2017  [facility] [place] Number:  Jill Copeland ([id number]) Facility and Address:  [facility], [address] [place], [place] [address]      Provider Number: [id number]  Attending Physician Name and Address:  No att. providers found  Relative Name and Phone Number:       Current Level of Care: Hospital Recommended Level of Care: SNF  Prior Approval Number:    Date Approved/Denied:   PASRR Number:   Discharge Plan: SNF     Current Diagnoses: Patient Active Problem List   Diagnosis Date Noted  . Asthma 03/11/2017  . Pain management 03/11/2017  . Advanced care planning/counseling discussion 11/19/2016  . Impaired mobility and ADLs 11/19/2016  . Adventitious breath sounds 11/19/2016  . Pacemaker infection ([place]) 10/11/2016  . Endocarditis 10/09/2016  . Schizoaffective disorder ([place])   . Pressure ulcer 01/13/2015  . Multiple falls 01/12/2015  . Overactive bladder 12/30/2014  . Alzheimer disease   . Schizo affective schizophrenia ([place])   . Recurrent pneumonia   . Dysphagia causing pulmonary aspiration with swallowing   . Pneumococcal pneumonia ([place])   . Arrhythmia   . Esophageal stricture   . Hypothyroidism   . Lung nodule   . Dementia in chronic schizophrenia ([place]) 08/12/2014  . Social discord 08/12/2014  . Family conflict 83/38/2505    Orientation RESPIRATION BLADDER Height & Weight     Self, Time, Place  Normal Continent Weight:   Height:     BEHAVIORAL SYMPTOMS/MOOD NEUROLOGICAL BOWEL NUTRITION STATUS      Continent Diet(Regular Diet. )  AMBULATORY STATUS COMMUNICATION OF NEEDS Skin   Extensive Assistance Verbally Normal                       Personal Care Assistance Level of Assistance  Bathing, Feeding, Dressing Bathing Assistance: Limited  assistance Feeding assistance: Independent Dressing Assistance: Limited assistance     Functional Limitations Info  Sight, Hearing, Speech Sight Info: Adequate Hearing Info: Adequate Speech Info: Adequate    SPECIAL CARE FACTORS FREQUENCY   PT and OT   5 times per week.                   Contractures      Additional Factors Info  Code Status, Allergies Code Status Info: (Full Code. ) Allergies Info: (Penicillins)           Current Medications (03/22/2017):  This is the current hospital active medication list Current Facility-Administered Medications  Medication Dose Route Frequency Provider Last Rate Last Dose  . divalproex (DEPAKOTE ER) 24 hr tablet 250 mg  250 mg Oral Daily Darel Hong, MD   250 mg at 03/22/17 1002  . furosemide (LASIX) tablet 20 mg  20 mg Oral Cloria Spring, MD   20 mg at 03/21/17 1954  . gabapentin (NEURONTIN) capsule 200 mg  200 mg Oral TID Darel Hong, MD   200 mg at 03/22/17 1002  . levothyroxine (SYNTHROID, LEVOTHROID) tablet 125 mcg  125 mcg Oral QAC breakfast Darel Hong, MD   125 mcg at 03/22/17 0851  . memantine (NAMENDA) tablet 5 mg  5 mg Oral BID Darel Hong, MD   5 mg at 03/22/17 1002  . metoprolol tartrate (LOPRESSOR) tablet 12.5 mg  12.5 mg Oral BID Darel Hong, MD   12.5  mg at 03/22/17 1003  . OLANZapine (ZYPREXA) tablet 10 mg  10 mg Oral QHS Clapacs, Madie Reno, MD   10 mg at 03/21/17 2233  . OLANZapine (ZYPREXA) tablet 5 mg  5 mg Oral Daily Clapacs, John T, MD   5 mg at 03/22/17 1002  . sertraline (ZOLOFT) tablet 50 mg  50 mg Oral QHS Darel Hong, MD   50 mg at 03/21/17 2234   Current Outpatient Medications  Medication Sig Dispense Refill  . albuterol (PROVENTIL HFA;VENTOLIN HFA) 108 (90 Base) MCG/ACT inhaler Inhale 2 puffs into the lungs every 6 (six) hours as needed for wheezing or shortness of breath. 1 Inhaler 0  . Cholecalciferol 2000 units TABS Take 2,000 Units by mouth daily.    . divalproex  (DEPAKOTE ER) 250 MG 24 hr tablet Take 1 tablet (250 mg total) by mouth daily. 30 tablet 0  . docusate sodium (COLACE) 100 MG capsule Take 100 mg by mouth 2 (two) times daily.    . ferrous sulfate 325 (65 FE) MG tablet Take 325 mg by mouth daily with breakfast.    . fluticasone (FLONASE) 50 MCG/ACT nasal spray Place 2 sprays into both nostrils daily. 16 g 0  . Fluticasone-Salmeterol (ADVAIR DISKUS) 250-50 MCG/DOSE AEPB Inhale 1 puff into the lungs 2 (two) times daily. 60 each 0  . furosemide (LASIX) 20 MG tablet Take 1 tablet (20 mg total) by mouth every other day. 30 tablet 0  . gabapentin (NEURONTIN) 100 MG capsule Take 2 capsules (200 mg total) by mouth 3 (three) times daily. 90 capsule 0  . levothyroxine (SYNTHROID, LEVOTHROID) 125 MCG tablet Take 1 tablet (125 mcg total) by mouth daily before breakfast. 30 tablet 0  . loperamide (IMODIUM) 2 MG capsule Take 2 mg by mouth as needed for diarrhea or loose stools.    Marland Kitchen loratadine (CLARITIN) 10 MG tablet Take 10 mg by mouth daily.    Marland Kitchen LORazepam (ATIVAN) 0.5 MG tablet Take 1 tablet (0.5 mg total) by mouth 2 (two) times daily as needed for anxiety. 10 tablet 0  . Melatonin 3 MG TABS Take 3 mg by mouth at bedtime.    . memantine (NAMENDA) 5 MG tablet Take 1 tablet (5 mg total) by mouth 2 (two) times daily. 60 tablet 0  . metoprolol tartrate (LOPRESSOR) 25 MG tablet Take 0.5 tablets (12.5 mg total) by mouth 2 (two) times daily. 60 tablet 0  . OLANZapine (ZYPREXA) 10 MG tablet Take 0.5 tablets (5 mg total) by mouth 2 (two) times daily. 60 tablet 0  . ondansetron (ZOFRAN) 8 MG tablet Take 1 tablet (8 mg total) by mouth every 8 (eight) hours as needed for nausea or vomiting. 90 tablet 0  . potassium chloride (K-DUR,KLOR-CON) 10 MEQ tablet Take 1 tablet (10 mEq total) by mouth every other day. 30 tablet 0  . senna (SENOKOT) 8.6 MG TABS tablet Take 1 tablet by mouth 2 (two) times a week. Mondays and Fridays    . sertraline (ZOLOFT) 50 MG tablet Take 1  tablet (50 mg total) by mouth at bedtime. 30 tablet 0  . sodium chloride 1 G tablet Take 1 g by mouth 3 (three) times daily.    . traMADol (ULTRAM) 50 MG tablet Take 1 tablet (50 mg total) by mouth 2 (two) times daily as needed for moderate pain. 10 tablet 0  . saccharomyces boulardii (FLORASTOR) 250 MG capsule Take 1 capsule (250 mg total) by mouth 2 (two) times daily. (Patient not taking: Reported  on 03/21/2017) 30 capsule 0     Discharge Medications: Please see discharge summary for a list of discharge medications.  Relevant Imaging Results:  Relevant Lab Results:   Additional Information (SSN: [id number])  Shawnie Nicole, Veronia Beets, LCSW

## 2017-03-22 NOTE — Progress Notes (Signed)
Clinical Social Worker ([place]) contacted Manuela Schwartz [phone] from [facility] in [place] and made her aware of patient needing family care home placement. Per Manuela Schwartz she will come assess patient today in the ED to see if she can accept her. FL2 complete, referral was sent to [facility] via [place].   [employer], LCSW [phone]

## 2017-03-22 NOTE — Progress Notes (Signed)
Clinical Education officer, museum (CSW) contacted [facility] [place] owner and asked if she could review referral. Per Peter Congo she can't take patient because she is "too acute".  [employer], LCSW [phone]

## 2017-03-22 NOTE — ED Notes (Signed)
Pt's home medication reconciliation completed; medications given to pharmacy for safe keeping; med rec form signed by this RN and pharmacy tech and copy placed in pt's chart.

## 2017-03-22 NOTE — Progress Notes (Signed)
Per [place] owner she reviewed patient's referral and stated that she is too acute and needs inpatient treatment before coming to a group home. CSW extended family care home search and sent the referral out in the [place] to [facility]   [employer], LCSW [phone]

## 2017-03-22 NOTE — ED Notes (Signed)
Patient is calm, alert. Bed was adjusted for patient's comfort. NAD.

## 2017-03-22 NOTE — ED Provider Notes (Signed)
-----------------------------------------   11:30 PM on 03/22/2017 -----------------------------------------   Blood pressure 113/65, pulse 72, temperature 98.4 F (36.9 C), temperature source Oral, resp. rate 18, SpO2 94 %.  The patient had no acute events during this shift. Social work extending Secretary/administrator for placement. Ongoing care assigned to Dr. Rich Number, MD 03/22/17 2330

## 2017-03-22 NOTE — ED Notes (Signed)
Patient work easily and remained cooperative.

## 2017-03-23 DIAGNOSIS — F250 Schizoaffective disorder, bipolar type: Secondary | ICD-10-CM | POA: Diagnosis not present

## 2017-03-23 DIAGNOSIS — G309 Alzheimer's disease, unspecified: Secondary | ICD-10-CM | POA: Diagnosis not present

## 2017-03-23 LAB — URINALYSIS, COMPLETE (UACMP) WITH MICROSCOPIC
Bacteria, UA: NONE SEEN
Bilirubin Urine: NEGATIVE
GLUCOSE, UA: NEGATIVE mg/dL
HGB URINE DIPSTICK: NEGATIVE
Ketones, ur: NEGATIVE mg/dL
NITRITE: NEGATIVE
PH: 5 (ref 5.0–8.0)
Protein, ur: NEGATIVE mg/dL
SPECIFIC GRAVITY, URINE: 1.03 (ref 1.005–1.030)

## 2017-03-23 MED ORDER — HALOPERIDOL LACTATE 5 MG/ML IJ SOLN
5.0000 mg | Freq: Once | INTRAMUSCULAR | Status: AC
  Administered 2017-03-23: 5 mg via INTRAMUSCULAR

## 2017-03-23 MED ORDER — LORAZEPAM 2 MG/ML IJ SOLN
2.0000 mg | Freq: Once | INTRAMUSCULAR | Status: AC
  Administered 2017-03-23: 2 mg via INTRAMUSCULAR

## 2017-03-23 MED ORDER — ENSURE ENLIVE PO LIQD
237.0000 mL | Freq: Two times a day (BID) | ORAL | Status: DC
  Administered 2017-03-23 – 2017-03-25 (×3): 237 mL via ORAL

## 2017-03-23 MED ORDER — LORAZEPAM 2 MG/ML IJ SOLN
INTRAMUSCULAR | Status: AC
Start: 2017-03-23 — End: 2017-03-24
  Filled 2017-03-23: qty 1

## 2017-03-23 MED ORDER — HALOPERIDOL LACTATE 5 MG/ML IJ SOLN
INTRAMUSCULAR | Status: AC
  Filled 2017-03-23: qty 1

## 2017-03-23 NOTE — ED Notes (Signed)
Patient is eating the contents of her breakfast tray at this time.

## 2017-03-23 NOTE — ED Notes (Addendum)
Patient drank all of her vanilla Ensure. Patient was assisted to the restroom. The patient is not combative and follows commands well.

## 2017-03-23 NOTE — ED Notes (Signed)
Pt repeatedly attempted to get out of bed. Not listening or cooperating with any instructions. Yelling out Help and Come Get Me

## 2017-03-23 NOTE — ED Notes (Signed)
Assisted the patient to the bathroom, I walked with the patient to the commode and helped her urinate and clean up.

## 2017-03-23 NOTE — ED Provider Notes (Addendum)
-----------------------------------------   9:52 PM on 03/23/2017 -----------------------------------------  We continue to await patient placement.  Most recent social work note states the patient may have a bed possibly [facility] we will continue to monitor this situation to see if we can place the patient as soon as possible.  Otherwise the patient is at her baseline, she will occasionally get agitated and attempt to get out of bed but is usually able to be redirected.  Patient has received her nighttime Depakote and Zyprexa which has helped her agitation and the patient is now resting comfortably.  We continue to have a sitter at the patient's bedside for patient safety.  We have been able to get the patient up and out of bed multiple times with a sitter present today.   Harvest Dark, MD 03/23/17 2153  ----------------------------------------- 10:29 PM on 03/23/2017 -----------------------------------------  Patient has once again become agitated attempting to get out of bed.  Attempting to bite staff at times.  Unfortunately not able to verbally redirect, we will dose 5 mg of Haldol 2 mg Ativan intramuscularly, continue to closely monitor with a sitter present in the emergency department.    Harvest Dark, MD 03/23/17 2230

## 2017-03-23 NOTE — ED Notes (Signed)
PT consult placed per Dr Cinda Quest. Pt up to br with one assist. Safety sitter at bedside.

## 2017-03-23 NOTE — Progress Notes (Signed)
PT is recommending SNF. Clinical Education officer, museum (CSW) sent out SNF referral in [place]. No beds offers at this point. [facility] admissions coordinator at [facility] stated that she will come assess patient today to see if they can accept her.   [employer], LCSW [phone]

## 2017-03-23 NOTE — ED Notes (Signed)
Pharm contacted for need of gabapentin, will send shortly, dietary also notified for the ensure ordered.

## 2017-03-23 NOTE — ED Notes (Signed)
Pt continues to be agitated, trying to get out of bed, grabbing at the sitter repeatedly.  EDP notified; see new orders.

## 2017-03-23 NOTE — ED Notes (Addendum)
Pt daughter spoke with [facility] and states that they have told her they have placement available for pt. [phone] is number daughter was given to call.

## 2017-03-23 NOTE — ED Notes (Signed)
Per staff reports between nurses, patient has not been eating well last few meals. Dr. Owens Shark notified. Ensure order placed.

## 2017-03-23 NOTE — ED Notes (Signed)
Pt ambulated to toilet using walker. ED staff by pt's side.

## 2017-03-23 NOTE — Progress Notes (Addendum)
Deborah admissions coordinator at [facility] stated that she will start [facility] SNF authorization and may be able to accept patient. SNF level PASARR is pending. Patient has an open Adult Scientist, forensic (APS) case in [place]. APS worker Randi [phone] came to visit patient today.   [employer], LCSW [phone]
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
## 2017-03-23 NOTE — ED Notes (Signed)
Patient continues to sleep soundly without any issues or needs.

## 2017-03-23 NOTE — ED Notes (Signed)
Pt given meal tray, pt ate some of it, but not all of it. Pt given some applesauce as well.

## 2017-03-23 NOTE — ED Notes (Signed)
Pt provided dinner tray.

## 2017-03-23 NOTE — ED Notes (Signed)
Spoke to MD about behavior and safety. Will give 2200 medications now to see if this improves pts agitation.

## 2017-03-23 NOTE — ED Notes (Signed)
Daughter has come by for a visit - observed by myself - questions answered  Social work number provided to her daughter    Pt verbalizes to me after her daughter has left  "You know - she was nice to you here but at home she is not nice to me"

## 2017-03-23 NOTE — ED Notes (Addendum)
PT consult completed and recommends skilled nursing facility placement.  Pt has not shown any type of aggressive behavior to nursing staff today, pt is cooperative but can be impatient at times. Pt with hx of dementia. Pt alert to self, time and place.

## 2017-03-23 NOTE — Consult Note (Signed)
[place] [facility] Consult   Reason for Consult: Follow-up consult 84 year old woman with dementia and a past history of mental illness. Referring Physician: Rip Harbour Patient Identification: Jill Copeland MRN:  [id number] Principal Diagnosis: Dementia in chronic schizophrenia [facility]) Diagnosis:   Patient Active Problem List   Diagnosis Date Noted  . Asthma [J45.909] 03/11/2017  . Pain management [R52] 03/11/2017  . Advanced care planning/counseling discussion [Z71.89] 11/19/2016  . Impaired mobility and ADLs [Z74.09] 11/19/2016  . Adventitious breath sounds [R06.89] 11/19/2016  . Pacemaker infection ([place]) [T82.7XXA] 10/11/2016  . Endocarditis [I38] 10/09/2016  . Schizoaffective disorder ([place]) [F25.9]   . Pressure ulcer [L89.90] 01/13/2015  . Multiple falls [R29.6] 01/12/2015  . Overactive bladder [N32.81] 12/30/2014  . Alzheimer disease [G30.9, F02.80]   . Schizo affective schizophrenia ([place]) [F25.0]   . Recurrent pneumonia [J18.9]   . Dysphagia causing pulmonary aspiration with swallowing [R13.19]   . Pneumococcal pneumonia ([place]) [J13]   . Arrhythmia [I49.9]   . Esophageal stricture [K22.2]   . Hypothyroidism [E03.9]   . Lung nodule [R91.1]   . Dementia in chronic schizophrenia ([place]) [F20.5] 08/12/2014  . Social discord [Z65.8] 08/12/2014  . Family conflict [K16.0] 10/93/2355    Total Time spent with patient: 30 minutes  Subjective:   Jill Copeland is a 84 y.o. female patient admitted with "I want to go".  HPI: Patient interviewed chart reviewed.  See previous notes.  84 year old woman with dementia and a past history of mental health problems who has been exhibiting behavior problems at home.  On interview today the patient remains confused with poor short-term memory.  Slightly irritable but not threatening.  Not suicidal.  No new physical complaints.  Skilled nursing facility has been recommended by physical therapy.  Social work is working on  placement.  Past Psychiatric History: Past history of multiple psychiatric hospitalizations more recent history of progressive dementia  Risk to Self: Is patient at risk for suicide?: No Risk to Others:   Prior Inpatient Therapy:   Prior Outpatient Therapy:    Past Medical History:  Past Medical History:  Diagnosis Date  . Alzheimer disease   . Arthritis   . Bacteremia    a. 09/2016 - admit w/ S hominis bacteremia w/ ? of pacer lead vegetation;  b. 09/2016 TEE:  unable to fully pass TEE probe due to esoph stricture; c. IV Abx per ID.  . Diabetes mellitus without complication ([place])   . Dysphagia causing pulmonary aspiration with swallowing   . Esophageal stricture    a. @ site of anastamosis from hiatal hernia surgery.  Marland Kitchen GERD (gastroesophageal reflux disease)   . Hiatal hernia    a. s/p prior repair with fistula;  b. 09/2016 CT chest: moderate to large hiatal hernia - stable.  . Hypothyroidism   . Pneumococcal pneumonia ([place])   . Recurrent pneumonia   . Recurrent UTI   . Schizo affective schizophrenia ([place])   . Stroke (cerebrum) ([place])   . Symptomatic bradycardia    a. s/p biotronik PPM 02/2005 at [facility] in [place], [place]  . Syncope    a. 02/2005 s/p Biotronik DC PPM @ [facility] in [place], [place].    Past Surgical History:  Procedure Laterality Date  . HERNIA REPAIR     hiatal  . JEJUNOSTOMY FEEDING TUBE    . PACEMAKER INSERTION    . TEE WITHOUT CARDIOVERSION N/A 10/11/2016   Procedure: TRANSESOPHAGEAL ECHOCARDIOGRAM (TEE);  Surgeon: Wellington Hampshire, MD;  Location: [facility] ORS;  Service: Cardiovascular;  Laterality: N/A;   Family History:  Family History  Problem Relation Age of Onset  . Thyroid cancer Grandchild   . Thyroid nodules Daughter   . Thyroid cancer Daughter   . Graves' disease Daughter   . Dementia Mother   . Schizophrenia Maternal Aunt   . Thyroid disease Son   . Cancer Son   . Cancer Brother   . Cancer Brother    Family Psychiatric   History: None known Social History:  Social History   Substance and Sexual Activity  Alcohol Use No  . Alcohol/week: 0.0 oz     Social History   Substance and Sexual Activity  Drug Use No    Social History   Socioeconomic History  . Marital status: Widowed    Spouse name: None  . Number of children: None  . Years of education: None  . Highest education level: None  Social Needs  . Financial resource strain: None  . Food insecurity - worry: None  . Food insecurity - inability: None  . Transportation needs - medical: None  . Transportation needs - non-medical: None  Occupational History  . None  Tobacco Use  . Smoking status: Never Smoker  . Smokeless tobacco: Never Used  Substance and Sexual Activity  . Alcohol use: No    Alcohol/week: 0.0 oz  . Drug use: No  . Sexual activity: None  Other Topics Concern  . None  Social History Narrative  . None   Additional Social History:    Allergies:    Labs:  Results for orders placed or performed during the hospital encounter of 03/21/17 (from the past 48 hour(s))  Comprehensive metabolic panel     Status: Abnormal   Collection Time: 03/21/17  8:30 PM  Result Value Ref Range   Sodium 137 135 - 145 mmol/L   Potassium 4.1 3.5 - 5.1 mmol/L   Chloride 99 (L) 101 - 111 mmol/L   CO2 27 22 - 32 mmol/L   Glucose, Bld 160 (H) 65 - 99 mg/dL   BUN 60 (H) 6 - 20 mg/dL   Creatinine, Ser 1.25 (H) 0.44 - 1.00 mg/dL   Calcium 9.4 8.9 - 10.3 mg/dL   Total Protein 8.2 (H) 6.5 - 8.1 g/dL   Albumin 3.8 3.5 - 5.0 g/dL   AST 54 (H) 15 - 41 U/L   ALT 29 14 - 54 U/L   Alkaline Phosphatase 88 38 - 126 U/L   Total Bilirubin 1.1 0.3 - 1.2 mg/dL   GFR calc non Af Amer 39 (L) >60 mL/min   GFR calc Af Amer 45 (L) >60 mL/min    Comment: (NOTE) The eGFR has been calculated using the CKD EPI equation. This calculation has not been validated in all clinical situations. eGFR's persistently <60 mL/min signify possible Chronic  Kidney Disease.    Anion gap 11 5 - 15    Comment: Performed at [facility], [address] [place], [place] [address]    Current Facility-Administered Medications  Medication Dose Route Frequency Provider Last Rate Last Dose  . divalproex (DEPAKOTE ER) 24 hr tablet 250 mg  250 mg Oral QHS Sol Englert T, MD      . feeding supplement (ENSURE ENLIVE) (ENSURE ENLIVE) liquid 237 mL  237 mL Oral BID BM Gregor Hams, MD   237 mL at 03/23/17 1428  . furosemide (LASIX) tablet 20 mg  20 mg Oral Cloria Spring, MD   20 mg at  03/23/17 1005  . gabapentin (NEURONTIN) capsule 200 mg  200 mg Oral TID Darel Hong, MD   200 mg at 03/23/17 1151  . levothyroxine (SYNTHROID, LEVOTHROID) tablet 125 mcg  125 mcg Oral QAC breakfast Darel Hong, MD   125 mcg at 03/23/17 1151  . memantine (NAMENDA) tablet 5 mg  5 mg Oral BID Darel Hong, MD   5 mg at 03/23/17 1004  . metoprolol tartrate (LOPRESSOR) tablet 12.5 mg  12.5 mg Oral BID Darel Hong, MD   12.5 mg at 03/23/17 1005  . OLANZapine (ZYPREXA) tablet 10 mg  10 mg Oral QHS Chael Urenda, Madie Reno, MD   10 mg at 03/22/17 2242  . OLANZapine (ZYPREXA) tablet 2.5 mg  2.5 mg Oral Daily Aishi Courts T, MD   2.5 mg at 03/23/17 1005  . sertraline (ZOLOFT) tablet 50 mg  50 mg Oral QHS Darel Hong, MD   50 mg at 03/22/17 2242   Current Outpatient Medications  Medication Sig Dispense Refill  . albuterol (PROVENTIL HFA;VENTOLIN HFA) 108 (90 Base) MCG/ACT inhaler Inhale 2 puffs into the lungs every 6 (six) hours as needed for wheezing or shortness of breath. 1 Inhaler 0  . Cholecalciferol 2000 units TABS Take 2,000 Units by mouth daily.    . divalproex (DEPAKOTE ER) 250 MG 24 hr tablet Take 1 tablet (250 mg total) by mouth daily. 30 tablet 0  . docusate sodium (COLACE) 100 MG capsule Take 100 mg by mouth 2 (two) times daily.    . ferrous sulfate 325 (65 FE) MG tablet Take 325 mg by mouth daily with breakfast.    . fluticasone (FLONASE)  50 MCG/ACT nasal spray Place 2 sprays into both nostrils daily. 16 g 0  . Fluticasone-Salmeterol (ADVAIR DISKUS) 250-50 MCG/DOSE AEPB Inhale 1 puff into the lungs 2 (two) times daily. 60 each 0  . furosemide (LASIX) 20 MG tablet Take 1 tablet (20 mg total) by mouth every other day. 30 tablet 0  . gabapentin (NEURONTIN) 100 MG capsule Take 2 capsules (200 mg total) by mouth 3 (three) times daily. 90 capsule 0  . levothyroxine (SYNTHROID, LEVOTHROID) 125 MCG tablet Take 1 tablet (125 mcg total) by mouth daily before breakfast. 30 tablet 0  . loperamide (IMODIUM) 2 MG capsule Take 2 mg by mouth as needed for diarrhea or loose stools.    Marland Kitchen loratadine (CLARITIN) 10 MG tablet Take 10 mg by mouth daily.    Marland Kitchen LORazepam (ATIVAN) 0.5 MG tablet Take 1 tablet (0.5 mg total) by mouth 2 (two) times daily as needed for anxiety. 10 tablet 0  . Melatonin 3 MG TABS Take 3 mg by mouth at bedtime.    . memantine (NAMENDA) 5 MG tablet Take 1 tablet (5 mg total) by mouth 2 (two) times daily. 60 tablet 0  . metoprolol tartrate (LOPRESSOR) 25 MG tablet Take 0.5 tablets (12.5 mg total) by mouth 2 (two) times daily. 60 tablet 0  . OLANZapine (ZYPREXA) 10 MG tablet Take 0.5 tablets (5 mg total) by mouth 2 (two) times daily. 60 tablet 0  . ondansetron (ZOFRAN) 8 MG tablet Take 1 tablet (8 mg total) by mouth every 8 (eight) hours as needed for nausea or vomiting. 90 tablet 0  . potassium chloride (K-DUR,KLOR-CON) 10 MEQ tablet Take 1 tablet (10 mEq total) by mouth every other day. 30 tablet 0  . senna (SENOKOT) 8.6 MG TABS tablet Take 1 tablet by mouth 2 (two) times a week. Mondays and Fridays    .  sertraline (ZOLOFT) 50 MG tablet Take 1 tablet (50 mg total) by mouth at bedtime. 30 tablet 0  . sodium chloride 1 G tablet Take 1 g by mouth 3 (three) times daily.    . traMADol (ULTRAM) 50 MG tablet Take 1 tablet (50 mg total) by mouth 2 (two) times daily as needed for moderate pain. 10 tablet 0  . saccharomyces boulardii  (FLORASTOR) 250 MG capsule Take 1 capsule (250 mg total) by mouth 2 (two) times daily. (Patient not taking: Reported on 03/21/2017) 30 capsule 0    Musculoskeletal: Strength & Muscle Tone: increased Gait & Station: unable to stand Patient leans: N/A  Psychiatric Specialty Exam: Physical Exam  ROS  Blood pressure 139/64, pulse 81, temperature 97.6 F (36.4 C), temperature source Oral, resp. rate 20, SpO2 94 %.There is no height or weight on file to calculate BMI.  General Appearance: Disheveled  Eye Contact:  Fair  Speech:  Garbled and Normal Rate  Volume:  Decreased  Mood:  Anxious and Dysphoric  Affect:  Congruent  Thought Process:  Disorganized  Orientation:  Full (Time, Place, and Person)  Thought Content:  Illogical, Rumination and Tangential  Suicidal Thoughts:  No  Homicidal Thoughts:  No  Memory:  Immediate;   Fair Recent;   Fair Remote;   Fair  Judgement:  Impaired  Insight:  Shallow  Psychomotor Activity:  Decreased  Concentration:  Concentration: Poor  Recall:  Poor  Fund of Knowledge:  Fair  Language:  Fair  Akathisia:  No  Handed:  Right  AIMS (if indicated):     Assets:  Desire for Improvement Social Support  ADL's:  Impaired  Cognition:  Impaired,  Mild  Sleep:        Treatment Plan Summary: Daily contact with patient to assess and evaluate symptoms and progress in treatment, Medication management and Plan Patient is on low-dose Risperdal which is improved behavior problems.  Still confused with poor memory.  Still with behaviors that are unmanageable at home.  Skilled nursing facility has been recommended.  Social work is trying to find a bed placement.  FL 2 signed.  Continue IV C for now.  No change to treatment plan.  Disposition: No evidence of imminent risk to self or others at present.   Supportive therapy provided about ongoing stressors.  Alethia Berthold, MD 03/23/2017 4:45 PM

## 2017-03-23 NOTE — ED Notes (Signed)
Pt is very agitated and grabbing the sitter and yelling out "help me, she is hurting me" attempted divisional activities. Took her for a ride in the wheelchair but behavior didn't improve. Different caregivers attempting to assist

## 2017-03-23 NOTE — ED Notes (Signed)
Ambulatory to the toilet with stand by assist and her walker

## 2017-03-23 NOTE — ED Notes (Signed)
Randi Hamil, SW in to see pt. Ms. Jill Copeland left her card and asked to be contacted if pt is discharged. Card placed with pt physical medical record.

## 2017-03-23 NOTE — ED Notes (Signed)
Patient is resting in the bed at this time.

## 2017-03-23 NOTE — ED Notes (Addendum)
Patient was given a bath, bed sheets were changed, and patients clothes were changed. After the bath, patient needed to use the restroom, so I assisted with walking the patient to the commode and she urinated. Patient is now back in bed resting comfortably. Patient very cooperative with no combative tendencies.

## 2017-03-24 DIAGNOSIS — G309 Alzheimer's disease, unspecified: Secondary | ICD-10-CM | POA: Diagnosis not present

## 2017-03-24 MED ORDER — LORAZEPAM 2 MG/ML IJ SOLN
2.0000 mg | Freq: Once | INTRAMUSCULAR | Status: AC
  Administered 2017-03-24: 2 mg via INTRAMUSCULAR

## 2017-03-24 MED ORDER — LORAZEPAM 2 MG/ML IJ SOLN
INTRAMUSCULAR | Status: AC
  Administered 2017-03-24: 2 mg via INTRAMUSCULAR
  Filled 2017-03-24: qty 1

## 2017-03-24 NOTE — ED Notes (Signed)
Patient does not want a snack at this time. Wants something for pain, RN informed.

## 2017-03-24 NOTE — ED Notes (Signed)
Pt up with walker and 2 assist to restroom, pt in NAD at this time

## 2017-03-24 NOTE — Progress Notes (Signed)
Patient's daughter Lattie Haw went to [facility] and toured facility and asked for a bed. Per [facility] liaison they can extend a bed offer and will start [facility] [place]. Deborah admissions coordinator at [facility] is aware of above. Patient will have to be sitter free for 24 hours prior to D/C to [facility]. Lattie Haw is aware of above.   [employer], LCSW [phone]

## 2017-03-24 NOTE — ED Notes (Signed)
Pt alert.  Skin warm and dry.  No sitter with pt now.

## 2017-03-24 NOTE — Progress Notes (Signed)
Physical Therapy Treatment Patient Details Name: Jill Copeland MRN: [id number] DOB: Oct 20, 1931 Today's Date: 03/24/2017    History of Present Illness Pt is a 84 y/o F who was brought to the ED after family called EMS due to pt's R ankle pain and inability to continue caring for the pt.  X-ray R ankle negative.     PT Comments    Ms. [place] continues to be easily distracted and requires cues to redirect and focus on task at hand.  Pt requires min assist for transfers and short distance ambulation due to instability.  SNF remains appropriate d/c plan.     Follow Up Recommendations  SNF;Supervision/Assistance - 24 hour     Equipment Recommendations  Rolling walker with 5" wheels    Recommendations for Other Services       Precautions / Restrictions Precautions Precautions: Fall Restrictions Weight Bearing Restrictions: No    Mobility  Bed Mobility Overal bed mobility: Needs Assistance Bed Mobility: Supine to Sit     Supine to sit: Min guard     General bed mobility comments: Increased time and effort with cues for sequencing and task initiation.  Pt reaching out for assist from therapist.  This PT encouraged pt to use bed rail.   Transfers Overall transfer level: Needs assistance Equipment used: Rolling walker (2 wheeled) Transfers: Sit to/from Stand Sit to Stand: Min assist         General transfer comment: Pt requires min assist to steady as she rises to standing from bed.  Min assist to boost to standing when rising from commode.  Cues for proper hand placement for improved balance and safety.   Ambulation/Gait Ambulation/Gait assistance: Min assist Ambulation Distance (Feet): 50 Feet Assistive device: Rolling walker (2 wheeled) Gait Pattern/deviations: Step-to pattern;Decreased stance time - right;Decreased step length - left;Decreased weight shift to right;Antalgic;Trunk flexed Gait velocity: decreased Gait velocity interpretation: Below normal speed for  age/gender General Gait Details: Pt with more even WBing this session but continues to favor weight shift to LLE.  Min assist to remain steady at times and min assist for RW managment through doorway.  Pt refuses to ambulate farther, perseverating on returning to bed.    Stairs            Wheelchair Mobility    Modified Rankin (Stroke Patients Only)       Balance Overall balance assessment: Needs assistance;History of Falls Sitting-balance support: No upper extremity supported;Feet supported Sitting balance-Leahy Scale: Fair     Standing balance support: Bilateral upper extremity supported;During functional activity Standing balance-Leahy Scale: Poor Standing balance comment: Pt relies on UE support for static and dynamic activities                            Cognition Arousal/Alertness: Awake/alert Behavior During Therapy: Restless;WFL for tasks assessed/performed Overall Cognitive Status: History of cognitive impairments - at baseline                                 General Comments: Per chart review, pt with dementia at baseline.  Pt perseverates on returning to bed and requires frequent redirecting to focus on task at hand.       Exercises General Exercises - Upper Extremity Shoulder Flexion: AROM;Both;10 reps;Supine General Exercises - Lower Extremity Straight Leg Raises: Both;AROM;Strengthening;10 reps;Supine    General Comments  Pertinent Vitals/Pain Pain Assessment: Faces Faces Pain Scale: Hurts little more Pain Location: R ankle Pain Descriptors / Indicators: Aching;Grimacing;Guarding Pain Intervention(s): Limited activity within patient's tolerance;Monitored during session    Home Living                      Prior Function            PT Goals (current goals can now be found in the care plan section) Acute Rehab PT Goals Patient Stated Goal: pt unable to participate in goal setting PT Goal Formulation:  Patient unable to participate in goal setting Time For Goal Achievement: 04/06/17 Potential to Achieve Goals: Fair Progress towards PT goals: Progressing toward goals(modestly)    Frequency    Min 2X/week      PT Plan Current plan remains appropriate    Co-evaluation              AM-PAC PT "6 Clicks" Daily Activity  Outcome Measure  Difficulty turning over in bed (including adjusting bedclothes, sheets and blankets)?: Unable Difficulty moving from lying on back to sitting on the side of the bed? : Unable Difficulty sitting down on and standing up from a chair with arms (e.g., wheelchair, bedside commode, etc,.)?: Unable Help needed moving to and from a bed to chair (including a wheelchair)?: A Little Help needed walking in hospital room?: A Little Help needed climbing 3-5 steps with a railing? : A Lot 6 Click Score: 11    End of Session Equipment Utilized During Treatment: Gait belt Activity Tolerance: Patient limited by pain Patient left: in bed;with call bell/phone within reach;with bed alarm set Nurse Communication: Other (comment);Mobility status(bed alarm set on highest alert setting) PT Visit Diagnosis: Pain;Unsteadiness on feet (R26.81);Other abnormalities of gait and mobility (R26.89);Repeated falls (R29.6);Muscle weakness (generalized) (M62.81) Pain - Right/Left: Right Pain - part of body: Ankle and joints of foot     Time: 5643-3295 PT Time Calculation (min) (ACUTE ONLY): 15 min  Charges:  $Gait Training: 8-22 mins                    G Codes:       Collie Siad PT, DPT 03/24/2017, 3:31 PM

## 2017-03-24 NOTE — ED Notes (Signed)
Patient sleeping at this time, medications held at this time, per out-going nurse.

## 2017-03-24 NOTE — ED Notes (Signed)
Placed patient on bedpan. Cleaned up and changed brief.

## 2017-03-24 NOTE — ED Notes (Signed)
VOL/SW placement

## 2017-03-24 NOTE — Progress Notes (Signed)
Per [facility] admissions coordinator at [place] [facility] authorization is still pending. SNF level PASARR has been received, [id number] E expires on 04/23/2017.   [employer], LCSW [phone]

## 2017-03-24 NOTE — Progress Notes (Addendum)
Per [facility] admissions coordinator at [place] SN[facility] authorization has been received. Per Neoma Laming patient can come to [facility] once she has been without a Actuary for 24 hours. Clinical Social Worker (CSW) made MD aware of above and he will talk to the charge nurse about discounting the sitter. CSW contacted patient's daughter Lattie Haw and made her aware of above. Per Lattie Haw she wants patient to go to [facility] in [place]. CSW made Lattie Haw aware that [facility] is an ALF. CSW made daughter aware that PT is recommending SNF a higher level then ALF and the only SNF bed available is [facility]. Daughter reported that patient was at [facility] before and she had a bad experience. CSW explained that [facility] is the only option at this ponit or daughter will have to take her home. Daughter verbalized her understanding and stated that she will call [facility]. CSW contacted APS worker Lala Lund and made her aware of above.   [employer], LCSW [phone]

## 2017-03-24 NOTE — ED Provider Notes (Signed)
-----------------------------------------   1:54 AM on 03/24/2017 -----------------------------------------   Blood pressure (!) 172/88, pulse 97, temperature 97.6 F (36.4 C), temperature source Oral, resp. rate (!) 22, SpO2 98 %.  The patient had no acute events since last update.  Calm and cooperative at this time.  Disposition is pending Psychiatry/Behavioral Medicine team recommendations.     Darel Hong, MD 03/24/17 [phone]

## 2017-03-24 NOTE — ED Notes (Signed)
Patient continues to sleep quietly at this time RR even and unlabored, in no acute distress at this time. MD approved holding AM meds to allow patient to continue to rest.

## 2017-03-24 NOTE — ED Provider Notes (Signed)
Social worker notified me that patient cannot have a one-to-one sitter if she is to be able to go to [facility].  Discussed with the charge nurse, the patient appears appropriate for every 15 minute checks which we have ordered as well as fall precautions.  We will discontinue her one-to-one sitter at this time, but maintain every 15 minute safety checks with fall precautions.     Delman Kitten, MD 03/24/17 1447

## 2017-03-24 NOTE — ED Notes (Signed)
Resumed care from jerrie rn.  PT with pt.

## 2017-03-24 NOTE — ED Notes (Signed)
Pt assisted to the use of bedpan.

## 2017-03-25 DIAGNOSIS — K219 Gastro-esophageal reflux disease without esophagitis: Secondary | ICD-10-CM | POA: Diagnosis not present

## 2017-03-25 DIAGNOSIS — R55 Syncope and collapse: Secondary | ICD-10-CM | POA: Diagnosis not present

## 2017-03-25 DIAGNOSIS — R000 Tachycardia, unspecified: Secondary | ICD-10-CM | POA: Diagnosis not present

## 2017-03-25 DIAGNOSIS — J90 Pleural effusion, not elsewhere classified: Secondary | ICD-10-CM | POA: Diagnosis not present

## 2017-03-25 DIAGNOSIS — Z79899 Other long term (current) drug therapy: Secondary | ICD-10-CM | POA: Diagnosis not present

## 2017-03-25 DIAGNOSIS — J45909 Unspecified asthma, uncomplicated: Secondary | ICD-10-CM | POA: Diagnosis not present

## 2017-03-25 DIAGNOSIS — I4891 Unspecified atrial fibrillation: Secondary | ICD-10-CM | POA: Diagnosis not present

## 2017-03-25 DIAGNOSIS — A419 Sepsis, unspecified organism: Secondary | ICD-10-CM | POA: Diagnosis not present

## 2017-03-25 DIAGNOSIS — I10 Essential (primary) hypertension: Secondary | ICD-10-CM | POA: Diagnosis not present

## 2017-03-25 DIAGNOSIS — J189 Pneumonia, unspecified organism: Secondary | ICD-10-CM | POA: Diagnosis not present

## 2017-03-25 DIAGNOSIS — M6281 Muscle weakness (generalized): Secondary | ICD-10-CM | POA: Diagnosis not present

## 2017-03-25 DIAGNOSIS — R1312 Dysphagia, oropharyngeal phase: Secondary | ICD-10-CM | POA: Diagnosis not present

## 2017-03-25 DIAGNOSIS — E119 Type 2 diabetes mellitus without complications: Secondary | ICD-10-CM | POA: Diagnosis not present

## 2017-03-25 DIAGNOSIS — R0789 Other chest pain: Secondary | ICD-10-CM | POA: Diagnosis not present

## 2017-03-25 DIAGNOSIS — J181 Lobar pneumonia, unspecified organism: Secondary | ICD-10-CM | POA: Diagnosis not present

## 2017-03-25 DIAGNOSIS — Z743 Need for continuous supervision: Secondary | ICD-10-CM | POA: Diagnosis not present

## 2017-03-25 DIAGNOSIS — R0600 Dyspnea, unspecified: Secondary | ICD-10-CM | POA: Diagnosis not present

## 2017-03-25 DIAGNOSIS — N390 Urinary tract infection, site not specified: Secondary | ICD-10-CM | POA: Diagnosis not present

## 2017-03-25 DIAGNOSIS — R918 Other nonspecific abnormal finding of lung field: Secondary | ICD-10-CM | POA: Diagnosis not present

## 2017-03-25 DIAGNOSIS — Z8673 Personal history of transient ischemic attack (TIA), and cerebral infarction without residual deficits: Secondary | ICD-10-CM | POA: Diagnosis not present

## 2017-03-25 DIAGNOSIS — R410 Disorientation, unspecified: Secondary | ICD-10-CM | POA: Diagnosis not present

## 2017-03-25 DIAGNOSIS — Z818 Family history of other mental and behavioral disorders: Secondary | ICD-10-CM | POA: Diagnosis not present

## 2017-03-25 DIAGNOSIS — Z79890 Hormone replacement therapy (postmenopausal): Secondary | ICD-10-CM | POA: Diagnosis not present

## 2017-03-25 DIAGNOSIS — Z880 Allergy status to penicillin: Secondary | ICD-10-CM | POA: Diagnosis not present

## 2017-03-25 DIAGNOSIS — Z9181 History of falling: Secondary | ICD-10-CM | POA: Diagnosis not present

## 2017-03-25 DIAGNOSIS — E039 Hypothyroidism, unspecified: Secondary | ICD-10-CM | POA: Diagnosis not present

## 2017-03-25 DIAGNOSIS — F259 Schizoaffective disorder, unspecified: Secondary | ICD-10-CM | POA: Diagnosis present

## 2017-03-25 DIAGNOSIS — G309 Alzheimer's disease, unspecified: Secondary | ICD-10-CM | POA: Diagnosis not present

## 2017-03-25 DIAGNOSIS — M1990 Unspecified osteoarthritis, unspecified site: Secondary | ICD-10-CM | POA: Diagnosis not present

## 2017-03-25 DIAGNOSIS — G4700 Insomnia, unspecified: Secondary | ICD-10-CM | POA: Diagnosis not present

## 2017-03-25 DIAGNOSIS — R296 Repeated falls: Secondary | ICD-10-CM | POA: Diagnosis not present

## 2017-03-25 DIAGNOSIS — M25579 Pain in unspecified ankle and joints of unspecified foot: Secondary | ICD-10-CM | POA: Diagnosis not present

## 2017-03-25 DIAGNOSIS — Z950 Presence of cardiac pacemaker: Secondary | ICD-10-CM | POA: Diagnosis not present

## 2017-03-25 DIAGNOSIS — F0280 Dementia in other diseases classified elsewhere without behavioral disturbance: Secondary | ICD-10-CM | POA: Diagnosis present

## 2017-03-25 DIAGNOSIS — M25571 Pain in right ankle and joints of right foot: Secondary | ICD-10-CM | POA: Diagnosis not present

## 2017-03-25 DIAGNOSIS — R404 Transient alteration of awareness: Secondary | ICD-10-CM | POA: Diagnosis not present

## 2017-03-25 DIAGNOSIS — Z66 Do not resuscitate: Secondary | ICD-10-CM | POA: Diagnosis not present

## 2017-03-25 DIAGNOSIS — Z87440 Personal history of urinary (tract) infections: Secondary | ICD-10-CM | POA: Diagnosis not present

## 2017-03-25 NOTE — ED Notes (Signed)
Pt assisted on bedpan.

## 2017-03-25 NOTE — ED Notes (Signed)
Pt's breathing unlabored and pulses strong

## 2017-03-25 NOTE — ED Notes (Signed)
Pt awake and given meal tray. Calm and cooperative at this time.

## 2017-03-25 NOTE — ED Provider Notes (Signed)
-----------------------------------------   2:02 AM on 03/25/2017 -----------------------------------------   Blood pressure (!) 163/83, pulse 72, temperature 98.2 F (36.8 C), temperature source Oral, resp. rate 18, SpO2 93 %.  She requested medication to help her sleep and IM ativan given.  Calm and cooperative at this time.  Disposition is pending Psychiatry/Behavioral Medicine team recommendations.     Darel Hong, MD 03/25/17 [phone]

## 2017-03-25 NOTE — ED Provider Notes (Signed)
-----------------------------------------   3:27 PM on 03/25/2017 -----------------------------------------   Blood pressure (!) 144/60, pulse 80, temperature 98.2 F (36.8 C), temperature source Oral, resp. rate 18, SpO2 100 %.  The patient had no acute events since last update.  Calm at this time.  [facility].    Orbie Pyo, MD 03/25/17 [phone]

## 2017-03-25 NOTE — ED Notes (Signed)
Will give meds when pt is awake

## 2017-03-25 NOTE — ED Notes (Signed)
Pt being transported EMS. Unable to sign due to dementia

## 2017-03-25 NOTE — ED Notes (Signed)
Pt assisted on bedpan. Pt urinated. Pt ate half of meal tray

## 2017-03-25 NOTE — Progress Notes (Addendum)
Per [facility] liaison they have received [facility] SNF authorization and [facility] can come to [facility] today room 602. Per RN patient has been without a sitter for 24 hours. CSW made RN aware that patient can D/C to [facility] after IVC is discontinued. RN will call report to 600 hall nurse at [phone], print AVS and send it with patient and arrange EMS for transport. CSW contacted patient's daughter Lattie Haw and made her aware of above. Per daughter she will go to [facility] this evening and bring patient her clothes. APS worker Lala Lund is aware of above. Please reconsult if future social work needs arise. CSW signing off.   [employer], LCSW [phone]

## 2017-03-25 NOTE — ED Notes (Signed)
Representative from [facility] here for pt. Requesting that we fax over pt med list [phone]).

## 2017-03-29 DIAGNOSIS — I10 Essential (primary) hypertension: Secondary | ICD-10-CM | POA: Diagnosis not present

## 2017-03-29 DIAGNOSIS — M25571 Pain in right ankle and joints of right foot: Secondary | ICD-10-CM | POA: Diagnosis not present

## 2017-03-29 DIAGNOSIS — E039 Hypothyroidism, unspecified: Secondary | ICD-10-CM | POA: Diagnosis not present

## 2017-03-30 DIAGNOSIS — G4700 Insomnia, unspecified: Secondary | ICD-10-CM | POA: Diagnosis not present

## 2017-04-05 DIAGNOSIS — M25571 Pain in right ankle and joints of right foot: Secondary | ICD-10-CM | POA: Diagnosis not present

## 2017-04-05 DIAGNOSIS — I10 Essential (primary) hypertension: Secondary | ICD-10-CM | POA: Diagnosis not present

## 2017-04-05 DIAGNOSIS — E039 Hypothyroidism, unspecified: Secondary | ICD-10-CM | POA: Diagnosis not present

## 2017-04-06 DIAGNOSIS — G4700 Insomnia, unspecified: Secondary | ICD-10-CM | POA: Diagnosis not present

## 2017-04-07 ENCOUNTER — Emergency Department
Admission: EM | Admit: 2017-04-07 | Discharge: 2017-04-07 | Disposition: A | Discharge status: Home / Self Care | Payer: Medicare Other | Source: Home / Self Care | Attending: Emergency Medicine | Admitting: Emergency Medicine

## 2017-04-07 ENCOUNTER — Encounter: Payer: Self-pay | Admitting: Emergency Medicine

## 2017-04-07 ENCOUNTER — Emergency Department: Payer: Medicare Other

## 2017-04-07 ENCOUNTER — Other Ambulatory Visit: Payer: Self-pay

## 2017-04-07 DIAGNOSIS — E119 Type 2 diabetes mellitus without complications: Secondary | ICD-10-CM

## 2017-04-07 DIAGNOSIS — G309 Alzheimer's disease, unspecified: Secondary | ICD-10-CM

## 2017-04-07 DIAGNOSIS — Z950 Presence of cardiac pacemaker: Secondary | ICD-10-CM | POA: Insufficient documentation

## 2017-04-07 DIAGNOSIS — Z931 Gastrostomy status: Secondary | ICD-10-CM

## 2017-04-07 DIAGNOSIS — Z79899 Other long term (current) drug therapy: Secondary | ICD-10-CM

## 2017-04-07 DIAGNOSIS — E039 Hypothyroidism, unspecified: Secondary | ICD-10-CM

## 2017-04-07 DIAGNOSIS — R918 Other nonspecific abnormal finding of lung field: Secondary | ICD-10-CM | POA: Diagnosis not present

## 2017-04-07 DIAGNOSIS — R0789 Other chest pain: Secondary | ICD-10-CM | POA: Diagnosis not present

## 2017-04-07 DIAGNOSIS — A419 Sepsis, unspecified organism: Secondary | ICD-10-CM | POA: Diagnosis not present

## 2017-04-07 DIAGNOSIS — Z8673 Personal history of transient ischemic attack (TIA), and cerebral infarction without residual deficits: Secondary | ICD-10-CM | POA: Insufficient documentation

## 2017-04-07 DIAGNOSIS — R55 Syncope and collapse: Secondary | ICD-10-CM

## 2017-04-07 DIAGNOSIS — F0280 Dementia in other diseases classified elsewhere without behavioral disturbance: Secondary | ICD-10-CM | POA: Insufficient documentation

## 2017-04-07 DIAGNOSIS — R410 Disorientation, unspecified: Secondary | ICD-10-CM | POA: Diagnosis not present

## 2017-04-07 LAB — URINALYSIS, COMPLETE (UACMP) WITH MICROSCOPIC
BACTERIA UA: NONE SEEN
BILIRUBIN URINE: NEGATIVE
Glucose, UA: NEGATIVE mg/dL
HGB URINE DIPSTICK: NEGATIVE
KETONES UR: 5 mg/dL — AB
Leukocytes, UA: NEGATIVE
Nitrite: NEGATIVE
PROTEIN: NEGATIVE mg/dL
SPECIFIC GRAVITY, URINE: 1.019 (ref 1.005–1.030)
pH: 5 (ref 5.0–8.0)

## 2017-04-07 LAB — CBC
HEMATOCRIT: 43.2 % (ref 35.0–47.0)
Hemoglobin: 14.1 g/dL (ref 12.0–16.0)
MCH: 29.5 pg (ref 26.0–34.0)
MCHC: 32.6 g/dL (ref 32.0–36.0)
MCV: 90.4 fL (ref 80.0–100.0)
PLATELETS: 238 10*3/uL (ref 150–440)
RBC: 4.78 MIL/uL (ref 3.80–5.20)
RDW: 14.9 % — ABNORMAL HIGH (ref 11.5–14.5)
WBC: 11.8 10*3/uL — ABNORMAL HIGH (ref 3.6–11.0)

## 2017-04-07 LAB — BASIC METABOLIC PANEL
Anion gap: 15 (ref 5–15)
BUN: 23 mg/dL — AB (ref 6–20)
CHLORIDE: 99 mmol/L — AB (ref 101–111)
CO2: 21 mmol/L — AB (ref 22–32)
CREATININE: 1.3 mg/dL — AB (ref 0.44–1.00)
Calcium: 8.5 mg/dL — ABNORMAL LOW (ref 8.9–10.3)
GFR calc Af Amer: 43 mL/min — ABNORMAL LOW (ref >60)
GFR calc non Af Amer: 37 mL/min — ABNORMAL LOW (ref >60)
Glucose, Bld: 103 mg/dL — ABNORMAL HIGH (ref 65–99)
POTASSIUM: 4.1 mmol/L (ref 3.5–5.1)
Sodium: 135 mmol/L (ref 135–145)

## 2017-04-07 LAB — TROPONIN I

## 2017-04-07 MED ORDER — SODIUM CHLORIDE 0.9 % IV BOLUS (SEPSIS)
500.0000 mL | Freq: Once | INTRAVENOUS | Status: AC
  Administered 2017-04-07: 500 mL via INTRAVENOUS

## 2017-04-07 NOTE — Discharge Instructions (Signed)
Avoid any sedative medications as these may cause excessive sleepiness and put you at risk of falling and getting hurt.

## 2017-04-07 NOTE — ED Triage Notes (Signed)
Pt presents from [facility] via [facility] with complaints of a "witnessed syncopal episode" She was sitting in her chair and staff told EMS she became pale and diaphoretic, lips turned blue. EMS states that she was alert and oriented once they got there.

## 2017-04-07 NOTE — ED Notes (Signed)
Pt waiting for EMS transport back to [facility].

## 2017-04-07 NOTE — ED Notes (Signed)
Pt transported back to [facility] with EMS. VSS. NAD. Discharge instructions sent with patient.

## 2017-04-07 NOTE — ED Provider Notes (Signed)
[facility] Emergency Department Provider Note  ____________________________________________  Time seen: Approximately 3:34 PM  I have reviewed the triage vital signs and the nursing notes.   HISTORY  Chief Complaint Loss of Consciousness  Level 5 Caveat: Portions of the History and Physical are unable to be obtained due to patient being a poor historian due to chronic dementia   HPI Jill Copeland is a 83 y.o. female sent to the ED for an episode of decreased responsiveness or syncope while sitting in a chair at her nursing home. patient reports some mild left lower lateral chest wall pain. Denies pain with breathing. Denies shortness of breath or radiation. No known aggravating or alleviating factors. Intermittent.. Nursing home notes sent with EMS suggested they had a room air oxygen saturation of 90% and a blood pressure of about 86/50. For EMS, vitals were normal, blood sugar was normal.      Past Medical History:  Diagnosis Date  . Alzheimer disease   . Arthritis   . Bacteremia    a. 09/2016 - admit w/ S hominis bacteremia w/ ? of pacer lead vegetation;  b. 09/2016 TEE:  unable to fully pass TEE probe due to esoph stricture; c. IV Abx per ID.  . Diabetes mellitus without complication ([place])   . Dysphagia causing pulmonary aspiration with swallowing   . Esophageal stricture    a. @ site of anastamosis from hiatal hernia surgery.  Marland Kitchen GERD (gastroesophageal reflux disease)   . Hiatal hernia    a. s/p prior repair with fistula;  b. 09/2016 CT chest: moderate to large hiatal hernia - stable.  . Hypothyroidism   . Pneumococcal pneumonia ([place])   . Recurrent pneumonia   . Recurrent UTI   . Schizo affective schizophrenia ([place])   . Stroke (cerebrum) ([place])   . Symptomatic bradycardia    a. s/p biotronik PPM 02/2005 at [facility] in [place], [place]  . Syncope    a. 02/2005 s/p Biotronik DC PPM @ [facility] in [place], [place].     Patient Active  Problem List   Diagnosis Date Noted  . Asthma 03/11/2017  . Pain management 03/11/2017  . Advanced care planning/counseling discussion 11/19/2016  . Impaired mobility and ADLs 11/19/2016  . Adventitious breath sounds 11/19/2016  . Pacemaker infection ([place]) 10/11/2016  . Endocarditis 10/09/2016  . Schizoaffective disorder ([place])   . Pressure ulcer 01/13/2015  . Multiple falls 01/12/2015  . Overactive bladder 12/30/2014  . Alzheimer disease   . Schizo affective schizophrenia ([place])   . Recurrent pneumonia   . Dysphagia causing pulmonary aspiration with swallowing   . Pneumococcal pneumonia ([place])   . Arrhythmia   . Esophageal stricture   . Hypothyroidism   . Lung nodule   . Dementia in chronic schizophrenia ([place]) 08/12/2014  . Social discord 08/12/2014  . Family conflict 76/28/3151     Past Surgical History:  Procedure Laterality Date  . HERNIA REPAIR     hiatal  . JEJUNOSTOMY FEEDING TUBE    . PACEMAKER INSERTION    . TEE WITHOUT CARDIOVERSION N/A 10/11/2016   Procedure: TRANSESOPHAGEAL ECHOCARDIOGRAM (TEE);  Surgeon: Wellington Hampshire, MD;  Location: [facility] ORS;  Service: Cardiovascular;  Laterality: N/A;     Prior to Admission medications   Medication Sig Start Date End Date Taking? Authorizing Provider  albuterol (PROVENTIL HFA;VENTOLIN HFA) 108 (90 Base) MCG/ACT inhaler Inhale 2 puffs into the lungs every 6 (six) hours as needed for wheezing or shortness of breath. 03/16/17  Harvest Dark, MD  Cholecalciferol 2000 units TABS Take 2,000 Units by mouth daily.    [provider]  divalproex (DEPAKOTE ER) 250 MG 24 hr tablet Take 1 tablet (250 mg total) by mouth daily. 03/16/17   Harvest Dark, MD  docusate sodium (COLACE) 100 MG capsule Take 100 mg by mouth 2 (two) times daily.    [provider]  ferrous sulfate 325 (65 FE) MG tablet Take 325 mg by mouth daily with breakfast.    [provider]  fluticasone (FLONASE) 50 MCG/ACT nasal  spray Place 2 sprays into both nostrils daily. 03/16/17   Harvest Dark, MD  Fluticasone-Salmeterol (ADVAIR DISKUS) 250-50 MCG/DOSE AEPB Inhale 1 puff into the lungs 2 (two) times daily. 03/16/17   Harvest Dark, MD  furosemide (LASIX) 20 MG tablet Take 1 tablet (20 mg total) by mouth every other day. 03/16/17   Harvest Dark, MD  gabapentin (NEURONTIN) 100 MG capsule Take 2 capsules (200 mg total) by mouth 3 (three) times daily. 03/16/17   Harvest Dark, MD  levothyroxine (SYNTHROID, LEVOTHROID) 125 MCG tablet Take 1 tablet (125 mcg total) by mouth daily before breakfast. 03/16/17   Harvest Dark, MD  loperamide (IMODIUM) 2 MG capsule Take 2 mg by mouth as needed for diarrhea or loose stools.    [provider]  loratadine (CLARITIN) 10 MG tablet Take 10 mg by mouth daily.    [provider]  LORazepam (ATIVAN) 0.5 MG tablet Take 1 tablet (0.5 mg total) by mouth 2 (two) times daily as needed for anxiety. 03/16/17   Harvest Dark, MD  Melatonin 3 MG TABS Take 3 mg by mouth at bedtime.    [provider]  memantine (NAMENDA) 5 MG tablet Take 1 tablet (5 mg total) by mouth 2 (two) times daily. 03/16/17   Harvest Dark, MD  metoprolol tartrate (LOPRESSOR) 25 MG tablet Take 0.5 tablets (12.5 mg total) by mouth 2 (two) times daily. 03/16/17   Harvest Dark, MD  OLANZapine (ZYPREXA) 10 MG tablet Take 0.5 tablets (5 mg total) by mouth 2 (two) times daily. 03/16/17   Harvest Dark, MD  ondansetron (ZOFRAN) 8 MG tablet Take 1 tablet (8 mg total) by mouth every 8 (eight) hours as needed for nausea or vomiting. 03/11/17   Kathrine Haddock, NP  potassium chloride (K-DUR,KLOR-CON) 10 MEQ tablet Take 1 tablet (10 mEq total) by mouth every other day. 03/16/17   Harvest Dark, MD  saccharomyces boulardii (FLORASTOR) 250 MG capsule Take 1 capsule (250 mg total) by mouth 2 (two) times daily. Patient not taking: Reported on 03/21/2017 01/01/15    Hosie Poisson, MD  senna (SENOKOT) 8.6 MG TABS tablet Take 1 tablet by mouth 2 (two) times a week. Mondays and Fridays    [provider]  sertraline (ZOLOFT) 50 MG tablet Take 1 tablet (50 mg total) by mouth at bedtime. 03/16/17   Harvest Dark, MD  sodium chloride 1 G tablet Take 1 g by mouth 3 (three) times daily.    [provider]     Allergies Penicillins   Family History  Problem Relation Age of Onset  . Thyroid cancer Grandchild   . Thyroid nodules Daughter   . Thyroid cancer Daughter   . Graves' disease Daughter   . Dementia Mother   . Schizophrenia Maternal Aunt   . Thyroid disease Son   . Cancer Son   . Cancer Brother   . Cancer Brother     Social History Social History  Tobacco Use  . Smoking status: Never Smoker  . Smokeless tobacco: Never Used  Substance Use Topics  . Alcohol use: No    Alcohol/week: 0.0 oz  . Drug use: No    Review of Systems unable to reliably obtained due to dementia. ____________________________________________   PHYSICAL EXAM:  VITAL SIGNS: ED Triage Vitals  Enc Vitals Group     BP 04/07/17 1352 (!) 121/59     Pulse Rate 04/07/17 1352 69     Resp 04/07/17 1352 18     Temp 04/07/17 1352 (!) 97.5 F (36.4 C)     Temp Source 04/07/17 1352 Oral     SpO2 04/07/17 1352 94 %     Weight 04/07/17 1353 122 lb (55.3 kg)     Height 04/07/17 1353 5\' 4"  (1.626 m)     Head Circumference --      [facility] Flow --      Pain Score --      Pain Loc --      Pain Edu? --      Excl. in [place]? --     Vital signs reviewed, nursing assessments reviewed.   Constitutional:   Alert and oriented to self. Well appearing and in no distress. Eyes:   No scleral icterus.  EOMI. No nystagmus. No conjunctival pallor. PERRL. ENT   Head:   Normocephalic and atraumatic.   Nose:   No congestion/rhinnorhea.    Mouth/Throat:   dry mucous membranes, no pharyngeal erythema. No peritonsillar mass.    Neck:   No meningismus.  Full ROM. Hematological/Lymphatic/Immunilogical:   No cervical lymphadenopathy. Cardiovascular:   RRR. Symmetric bilateral radial and DP pulses.  No murmurs.  Respiratory:   Normal respiratory effort without tachypnea/retractions. Breath sounds are clear and equal bilaterally. No wheezes/rales/rhonchi. Gastrointestinal:   Soft and nontender. Non distended. There is no CVA tenderness.  No rebound, rigidity, or guarding. Genitourinary:   deferred Musculoskeletal:   Normal range of motion in all extremities. No joint effusions.  No lower extremity tenderness.  No edema. Neurologic:   Normal speech and language.  Motor grossly intact. No acute focal neurologic deficits are appreciated.  Skin:    Skin is warm, dry and intact. No rash noted.  No petechiae, purpura, or bullae.  ____________________________________________    LABS (pertinent positives/negatives) (all labs ordered are listed, but only abnormal results are displayed) Labs Reviewed  BASIC METABOLIC PANEL - Abnormal; Notable for the following components:      Result Value   Chloride 99 (*)    CO2 21 (*)    Glucose, Bld 103 (*)    BUN 23 (*)    Creatinine, Ser 1.30 (*)    Calcium 8.5 (*)    GFR calc non Af Amer 37 (*)    GFR calc Af Amer 43 (*)    All other components within normal limits  CBC - Abnormal; Notable for the following components:   WBC 11.8 (*)    RDW 14.9 (*)    All other components within normal limits  URINALYSIS, COMPLETE (UACMP) WITH MICROSCOPIC - Abnormal; Notable for the following components:   Color, Urine AMBER (*)    APPearance HAZY (*)    Ketones, ur 5 (*)    Squamous Epithelial / LPF 0-5 (*)    All other components within normal limits  URINE CULTURE  TROPONIN I  CBG MONITORING, ED   ____________________________________________   EKG  interpreted by me Sinus rhythm rate of 68, normal axis and  intervals. Normal QRS ST segments and T waves  ____________________________________________     RADIOLOGY  Dg Chest Portable 1 View  Result Date: 04/07/2017 CLINICAL DATA:  Syncope. EXAM: PORTABLE CHEST 1 VIEW COMPARISON:  Chest x-ray dated March 01, 2017. FINDINGS: Left chest wall pacer device in unchanged position. The heart size and mediastinal contours are within normal limits. Atherosclerotic calcification of the aortic arch. Normal pulmonary vascularity. Coarsened interstitial markings are again noted, with improved reticulonodular opacities. Chronic blunting of the left costophrenic angle is unchanged. No focal consolidation. No pneumothorax. No large pleural effusion. Unchanged hiatal hernia. No acute osseous abnormality. IMPRESSION: 1. Improved reticulonodular opacities in both lungs. No acute cardiopulmonary disease. Electronically Signed   By: Titus Dubin M.D.   On: 04/07/2017 14:40    ____________________________________________   PROCEDURES Procedures  ____________________________________________    CLINICAL IMPRESSION / ASSESSMENT AND PLAN / ED COURSE  Pertinent labs & imaging results that were available during my care of the patient were reviewed by me and considered in my medical decision making (see chart for details).   patient well appearing, not in distress, sent for evaluation of possible syncope. Vital signs on arrival to the ED are normal. Has some mild complaint of chest pain but otherwise no symptoms. Check labs including troponin, chest x-ray, urinalysis.  Clinical Course as of Apr 07 1729  Thu Apr 07, 2017  1607 Baseline ckd Creatinine: (!) 1.30 [PS]  1607 Labs unremarkble. Will give iv fluids for hydration, plan to DC home given lack of findings on exam and workup today.   [PS]    Clinical Course User Index [PS] Carrie Mew, MD     ----------------------------------------- 5:31 PM on 04/07/2017 -----------------------------------------  Patient remains calm and comfortable with stable vitals. Feels better and wants to go home.  Family member the bedside does report that the patient was recently started on a medication to help "calm her down" which may have had a sedative effect. Patient's suitable for outpatient follow-up with her primary care doctor, we'll plan to discharge back to nursing home.  ____________________________________________   FINAL CLINICAL IMPRESSION(S) / ED DIAGNOSES    Final diagnoses:  Syncope, unspecified syncope type       Portions of this note were generated with dragon dictation software. Dictation errors may occur despite best attempts at proofreading.    Carrie Mew, MD 04/07/17 [phone]

## 2017-04-08 ENCOUNTER — Emergency Department: Payer: Medicare Other

## 2017-04-08 ENCOUNTER — Inpatient Hospital Stay
Admission: EM | Admit: 2017-04-08 | Discharge: 2017-04-11 | DRG: 871 | Disposition: A | Discharge status: Skilled Nursing Facility | Payer: Medicare Other | Attending: Internal Medicine | Admitting: Internal Medicine

## 2017-04-08 ENCOUNTER — Other Ambulatory Visit: Payer: Self-pay

## 2017-04-08 DIAGNOSIS — Z87440 Personal history of urinary (tract) infections: Secondary | ICD-10-CM | POA: Diagnosis not present

## 2017-04-08 DIAGNOSIS — G309 Alzheimer's disease, unspecified: Secondary | ICD-10-CM | POA: Diagnosis not present

## 2017-04-08 DIAGNOSIS — Z818 Family history of other mental and behavioral disorders: Secondary | ICD-10-CM

## 2017-04-08 DIAGNOSIS — I4891 Unspecified atrial fibrillation: Secondary | ICD-10-CM | POA: Diagnosis not present

## 2017-04-08 DIAGNOSIS — E039 Hypothyroidism, unspecified: Secondary | ICD-10-CM | POA: Diagnosis not present

## 2017-04-08 DIAGNOSIS — J181 Lobar pneumonia, unspecified organism: Secondary | ICD-10-CM | POA: Diagnosis present

## 2017-04-08 DIAGNOSIS — F0280 Dementia in other diseases classified elsewhere without behavioral disturbance: Secondary | ICD-10-CM | POA: Diagnosis present

## 2017-04-08 DIAGNOSIS — M1990 Unspecified osteoarthritis, unspecified site: Secondary | ICD-10-CM | POA: Diagnosis present

## 2017-04-08 DIAGNOSIS — J90 Pleural effusion, not elsewhere classified: Secondary | ICD-10-CM | POA: Diagnosis not present

## 2017-04-08 DIAGNOSIS — R296 Repeated falls: Secondary | ICD-10-CM | POA: Diagnosis not present

## 2017-04-08 DIAGNOSIS — R410 Disorientation, unspecified: Secondary | ICD-10-CM | POA: Diagnosis not present

## 2017-04-08 DIAGNOSIS — J189 Pneumonia, unspecified organism: Secondary | ICD-10-CM | POA: Diagnosis not present

## 2017-04-08 DIAGNOSIS — Z79890 Hormone replacement therapy (postmenopausal): Secondary | ICD-10-CM | POA: Diagnosis not present

## 2017-04-08 DIAGNOSIS — Z79899 Other long term (current) drug therapy: Secondary | ICD-10-CM | POA: Diagnosis not present

## 2017-04-08 DIAGNOSIS — F259 Schizoaffective disorder, unspecified: Secondary | ICD-10-CM | POA: Diagnosis present

## 2017-04-08 DIAGNOSIS — K219 Gastro-esophageal reflux disease without esophagitis: Secondary | ICD-10-CM | POA: Diagnosis present

## 2017-04-08 DIAGNOSIS — A419 Sepsis, unspecified organism: Principal | ICD-10-CM

## 2017-04-08 DIAGNOSIS — Z8673 Personal history of transient ischemic attack (TIA), and cerebral infarction without residual deficits: Secondary | ICD-10-CM

## 2017-04-08 DIAGNOSIS — R2689 Other abnormalities of gait and mobility: Secondary | ICD-10-CM | POA: Diagnosis not present

## 2017-04-08 DIAGNOSIS — Z66 Do not resuscitate: Secondary | ICD-10-CM | POA: Diagnosis present

## 2017-04-08 DIAGNOSIS — Z880 Allergy status to penicillin: Secondary | ICD-10-CM

## 2017-04-08 DIAGNOSIS — E119 Type 2 diabetes mellitus without complications: Secondary | ICD-10-CM | POA: Diagnosis present

## 2017-04-08 DIAGNOSIS — R1312 Dysphagia, oropharyngeal phase: Secondary | ICD-10-CM | POA: Diagnosis not present

## 2017-04-08 DIAGNOSIS — R000 Tachycardia, unspecified: Secondary | ICD-10-CM | POA: Diagnosis not present

## 2017-04-08 DIAGNOSIS — R2681 Unsteadiness on feet: Secondary | ICD-10-CM | POA: Diagnosis not present

## 2017-04-08 DIAGNOSIS — Z7951 Long term (current) use of inhaled steroids: Secondary | ICD-10-CM

## 2017-04-08 DIAGNOSIS — M6281 Muscle weakness (generalized): Secondary | ICD-10-CM | POA: Diagnosis not present

## 2017-04-08 DIAGNOSIS — R0600 Dyspnea, unspecified: Secondary | ICD-10-CM | POA: Diagnosis not present

## 2017-04-08 LAB — COMPREHENSIVE METABOLIC PANEL
ALBUMIN: 2.9 g/dL — AB (ref 3.5–5.0)
ALT: 66 U/L — ABNORMAL HIGH (ref 14–54)
AST: 128 U/L — AB (ref 15–41)
Alkaline Phosphatase: 443 U/L — ABNORMAL HIGH (ref 38–126)
Anion gap: 11 (ref 5–15)
BUN: 27 mg/dL — AB (ref 6–20)
CHLORIDE: 103 mmol/L (ref 101–111)
CO2: 23 mmol/L (ref 22–32)
Calcium: 8.3 mg/dL — ABNORMAL LOW (ref 8.9–10.3)
Creatinine, Ser: 1.03 mg/dL — ABNORMAL HIGH (ref 0.44–1.00)
GFR calc Af Amer: 57 mL/min — ABNORMAL LOW (ref >60)
GFR, EST NON AFRICAN AMERICAN: 49 mL/min — AB (ref >60)
Glucose, Bld: 158 mg/dL — ABNORMAL HIGH (ref 65–99)
POTASSIUM: 3.8 mmol/L (ref 3.5–5.1)
Sodium: 137 mmol/L (ref 135–145)
Total Bilirubin: 0.7 mg/dL (ref 0.3–1.2)
Total Protein: 7 g/dL (ref 6.5–8.1)

## 2017-04-08 LAB — CBC WITH DIFFERENTIAL/PLATELET
BASOS ABS: 0 10*3/uL (ref 0–0.1)
Basophils Relative: 0 %
Eosinophils Absolute: 0.2 10*3/uL (ref 0–0.7)
Eosinophils Relative: 2 %
HEMATOCRIT: 38.2 % (ref 35.0–47.0)
HEMOGLOBIN: 12.6 g/dL (ref 12.0–16.0)
LYMPHS PCT: 4 %
Lymphs Abs: 0.3 10*3/uL — ABNORMAL LOW (ref 1.0–3.6)
MCH: 29.4 pg (ref 26.0–34.0)
MCHC: 32.9 g/dL (ref 32.0–36.0)
MCV: 89.6 fL (ref 80.0–100.0)
Monocytes Absolute: 0.2 10*3/uL (ref 0.2–0.9)
Monocytes Relative: 3 %
NEUTROS ABS: 8.1 10*3/uL — AB (ref 1.4–6.5)
Neutrophils Relative %: 91 %
Platelets: 198 10*3/uL (ref 150–440)
RBC: 4.27 MIL/uL (ref 3.80–5.20)
RDW: 14.8 % — ABNORMAL HIGH (ref 11.5–14.5)
WBC: 8.9 10*3/uL (ref 3.6–11.0)

## 2017-04-08 LAB — TROPONIN I: Troponin I: 0.05 ng/mL (ref <0.03)

## 2017-04-08 LAB — URINALYSIS, ROUTINE W REFLEX MICROSCOPIC
BACTERIA UA: NONE SEEN
BILIRUBIN URINE: NEGATIVE
Glucose, UA: NEGATIVE mg/dL
Hgb urine dipstick: NEGATIVE
KETONES UR: 5 mg/dL — AB
LEUKOCYTES UA: NEGATIVE
Nitrite: NEGATIVE
PROTEIN: 100 mg/dL — AB
Specific Gravity, Urine: 1.026 (ref 1.005–1.030)
pH: 5 (ref 5.0–8.0)

## 2017-04-08 LAB — URINE CULTURE: Culture: NO GROWTH

## 2017-04-08 LAB — GLUCOSE, CAPILLARY
Glucose-Capillary: 119 mg/dL — ABNORMAL HIGH (ref 65–99)
Glucose-Capillary: 174 mg/dL — ABNORMAL HIGH (ref 65–99)

## 2017-04-08 LAB — LACTIC ACID, PLASMA
LACTIC ACID, VENOUS: 1.8 mmol/L (ref 0.5–1.9)
Lactic Acid, Venous: 2.3 mmol/L (ref 0.5–1.9)

## 2017-04-08 LAB — HEMOGLOBIN A1C
HEMOGLOBIN A1C: 6 % — AB (ref 4.8–5.6)
MEAN PLASMA GLUCOSE: 125.5 mg/dL

## 2017-04-08 MED ORDER — LEVOFLOXACIN IN D5W 750 MG/150ML IV SOLN
750.0000 mg | Freq: Once | INTRAVENOUS | Status: AC
  Administered 2017-04-08: 750 mg via INTRAVENOUS
  Filled 2017-04-08: qty 150

## 2017-04-08 MED ORDER — ACETAMINOPHEN 325 MG PO TABS
650.0000 mg | ORAL_TABLET | Freq: Four times a day (QID) | ORAL | Status: DC | PRN
  Administered 2017-04-09: 650 mg via ORAL
  Filled 2017-04-08: qty 2

## 2017-04-08 MED ORDER — DIGOXIN 0.25 MG/ML IJ SOLN
0.2500 mg | Freq: Once | INTRAMUSCULAR | Status: AC
  Administered 2017-04-08: 0.25 mg via INTRAVENOUS
  Filled 2017-04-08: qty 1

## 2017-04-08 MED ORDER — METOPROLOL TARTRATE 25 MG PO TABS
12.5000 mg | ORAL_TABLET | Freq: Two times a day (BID) | ORAL | Status: DC
  Administered 2017-04-09 – 2017-04-11 (×5): 12.5 mg via ORAL
  Filled 2017-04-08 (×5): qty 1

## 2017-04-08 MED ORDER — CEFTRIAXONE SODIUM IN DEXTROSE 20 MG/ML IV SOLN
1.0000 g | INTRAVENOUS | Status: DC
  Administered 2017-04-08: 1 g via INTRAVENOUS
  Filled 2017-04-08 (×2): qty 50

## 2017-04-08 MED ORDER — METHYLPREDNISOLONE SODIUM SUCC 125 MG IJ SOLR
60.0000 mg | INTRAMUSCULAR | Status: DC
  Administered 2017-04-08: 60 mg via INTRAVENOUS

## 2017-04-08 MED ORDER — DIVALPROEX SODIUM ER 250 MG PO TB24
250.0000 mg | ORAL_TABLET | Freq: Every day | ORAL | Status: DC
  Administered 2017-04-09 – 2017-04-11 (×3): 250 mg via ORAL
  Filled 2017-04-08 (×3): qty 1

## 2017-04-08 MED ORDER — ENOXAPARIN SODIUM 40 MG/0.4ML ~~LOC~~ SOLN
40.0000 mg | SUBCUTANEOUS | Status: DC
  Administered 2017-04-09 – 2017-04-10 (×2): 40 mg via SUBCUTANEOUS
  Filled 2017-04-08 (×2): qty 0.4

## 2017-04-08 MED ORDER — AMIODARONE HCL IN DEXTROSE 360-4.14 MG/200ML-% IV SOLN: 30.0000 mg/h | INTRAVENOUS | Status: DC

## 2017-04-08 MED ORDER — OLANZAPINE 5 MG PO TABS
5.0000 mg | ORAL_TABLET | Freq: Two times a day (BID) | ORAL | Status: DC
  Administered 2017-04-09 – 2017-04-11 (×5): 5 mg via ORAL
  Filled 2017-04-08 (×7): qty 1

## 2017-04-08 MED ORDER — METHYLPREDNISOLONE SODIUM SUCC 125 MG IJ SOLR
INTRAMUSCULAR | Status: AC
Start: 2017-04-08 — End: 2017-04-09
  Filled 2017-04-08: qty 2

## 2017-04-08 MED ORDER — DOCUSATE SODIUM 100 MG PO CAPS
100.0000 mg | ORAL_CAPSULE | Freq: Two times a day (BID) | ORAL | Status: DC
  Administered 2017-04-09: 100 mg via ORAL

## 2017-04-08 MED ORDER — FLUTICASONE FUROATE-VILANTEROL 200-25 MCG/INH IN AEPB
1.0000 | INHALATION_SPRAY | Freq: Every day | RESPIRATORY_TRACT | Status: DC
  Administered 2017-04-09 – 2017-04-11 (×3): 1 via RESPIRATORY_TRACT
  Filled 2017-04-08: qty 28

## 2017-04-08 MED ORDER — SODIUM CHLORIDE 0.9 % IV BOLUS (SEPSIS)
1000.0000 mL | Freq: Once | INTRAVENOUS | Status: AC
  Administered 2017-04-08: 1000 mL via INTRAVENOUS

## 2017-04-08 MED ORDER — POTASSIUM CHLORIDE CRYS ER 10 MEQ PO TBCR
10.0000 meq | EXTENDED_RELEASE_TABLET | ORAL | Status: DC
  Administered 2017-04-09 – 2017-04-11 (×2): 10 meq via ORAL
  Filled 2017-04-08 (×2): qty 1

## 2017-04-08 MED ORDER — LOPERAMIDE HCL 2 MG PO CAPS: 2.0000 mg | ORAL_CAPSULE | ORAL | Status: DC | PRN

## 2017-04-08 MED ORDER — AZITHROMYCIN 500 MG PO TABS
500.0000 mg | ORAL_TABLET | Freq: Every day | ORAL | Status: DC
  Administered 2017-04-09 – 2017-04-10 (×2): 500 mg via ORAL
  Filled 2017-04-08: qty 2
  Filled 2017-04-08 (×2): qty 1
  Filled 2017-04-08 (×2): qty 2
  Filled 2017-04-08: qty 1

## 2017-04-08 MED ORDER — ACETAMINOPHEN 650 MG RE SUPP: 650.0000 mg | Freq: Four times a day (QID) | RECTAL | Status: DC | PRN

## 2017-04-08 MED ORDER — ONDANSETRON HCL 4 MG PO TABS: 4.0000 mg | ORAL_TABLET | Freq: Four times a day (QID) | ORAL | Status: DC | PRN

## 2017-04-08 MED ORDER — ONDANSETRON HCL 4 MG/2ML IJ SOLN: 4.0000 mg | Freq: Four times a day (QID) | INTRAMUSCULAR | Status: DC | PRN

## 2017-04-08 MED ORDER — AMIODARONE LOAD VIA INFUSION: 150.0000 mg | Freq: Once | INTRAVENOUS | Status: DC

## 2017-04-08 MED ORDER — LORAZEPAM 0.5 MG PO TABS
0.5000 mg | ORAL_TABLET | Freq: Two times a day (BID) | ORAL | Status: DC | PRN
  Administered 2017-04-09 – 2017-04-11 (×2): 0.5 mg via ORAL
  Filled 2017-04-08 (×2): qty 1

## 2017-04-08 MED ORDER — GABAPENTIN 100 MG PO CAPS
200.0000 mg | ORAL_CAPSULE | Freq: Three times a day (TID) | ORAL | Status: DC
  Administered 2017-04-09 – 2017-04-11 (×7): 200 mg via ORAL
  Filled 2017-04-08 (×7): qty 2

## 2017-04-08 MED ORDER — SERTRALINE HCL 50 MG PO TABS
50.0000 mg | ORAL_TABLET | Freq: Every day | ORAL | Status: DC
  Administered 2017-04-09 – 2017-04-10 (×2): 50 mg via ORAL
  Filled 2017-04-08 (×3): qty 1

## 2017-04-08 MED ORDER — FERROUS SULFATE 325 (65 FE) MG PO TABS
325.0000 mg | ORAL_TABLET | Freq: Every day | ORAL | Status: DC
  Administered 2017-04-09 – 2017-04-11 (×3): 325 mg via ORAL
  Filled 2017-04-08 (×3): qty 1

## 2017-04-08 MED ORDER — MEMANTINE HCL 5 MG PO TABS
5.0000 mg | ORAL_TABLET | Freq: Two times a day (BID) | ORAL | Status: DC
  Administered 2017-04-09 – 2017-04-11 (×5): 5 mg via ORAL
  Filled 2017-04-08 (×7): qty 1

## 2017-04-08 MED ORDER — IPRATROPIUM-ALBUTEROL 0.5-2.5 (3) MG/3ML IN SOLN
RESPIRATORY_TRACT | Status: AC
  Administered 2017-04-08: 3 mL via RESPIRATORY_TRACT
  Filled 2017-04-08: qty 3

## 2017-04-08 MED ORDER — DOCUSATE SODIUM 100 MG PO CAPS
100.0000 mg | ORAL_CAPSULE | Freq: Two times a day (BID) | ORAL | Status: DC
  Administered 2017-04-10 – 2017-04-11 (×2): 100 mg via ORAL
  Filled 2017-04-08 (×5): qty 1

## 2017-04-08 MED ORDER — LEVOTHYROXINE SODIUM 25 MCG PO TABS
125.0000 ug | ORAL_TABLET | Freq: Every day | ORAL | Status: DC
  Administered 2017-04-09 – 2017-04-11 (×3): 125 ug via ORAL
  Filled 2017-04-08 (×3): qty 1

## 2017-04-08 MED ORDER — ALBUTEROL SULFATE (2.5 MG/3ML) 0.083% IN NEBU
2.5000 mg | INHALATION_SOLUTION | RESPIRATORY_TRACT | Status: DC | PRN
Start: 2017-04-08 — End: 2017-04-11
  Administered 2017-04-09: 2.5 mg via RESPIRATORY_TRACT
  Filled 2017-04-08: qty 3

## 2017-04-08 MED ORDER — INSULIN ASPART 100 UNIT/ML ~~LOC~~ SOLN: 0.0000 [IU] | Freq: Every day | SUBCUTANEOUS | Status: DC

## 2017-04-08 MED ORDER — AMIODARONE HCL IN DEXTROSE 360-4.14 MG/200ML-% IV SOLN
INTRAVENOUS | Status: AC
  Filled 2017-04-08: qty 200

## 2017-04-08 MED ORDER — FLUTICASONE PROPIONATE 50 MCG/ACT NA SUSP
2.0000 | Freq: Every day | NASAL | Status: DC
  Administered 2017-04-09 – 2017-04-11 (×3): 2 via NASAL
  Filled 2017-04-08: qty 16

## 2017-04-08 MED ORDER — AZITHROMYCIN 500 MG PO TABS
ORAL_TABLET | ORAL | Status: AC
  Filled 2017-04-08: qty 1

## 2017-04-08 MED ORDER — AMIODARONE HCL IN DEXTROSE 360-4.14 MG/200ML-% IV SOLN: 60.0000 mg/h | INTRAVENOUS | Status: DC

## 2017-04-08 MED ORDER — SODIUM CHLORIDE 1 G PO TABS
1.0000 g | ORAL_TABLET | Freq: Three times a day (TID) | ORAL | Status: DC
  Administered 2017-04-09 – 2017-04-11 (×7): 1 g via ORAL
  Filled 2017-04-08 (×10): qty 1

## 2017-04-08 MED ORDER — POLYETHYLENE GLYCOL 3350 17 G PO PACK: 17.0000 g | PACK | Freq: Every day | ORAL | Status: DC | PRN

## 2017-04-08 MED ORDER — IPRATROPIUM-ALBUTEROL 0.5-2.5 (3) MG/3ML IN SOLN
3.0000 mL | Freq: Four times a day (QID) | RESPIRATORY_TRACT | Status: DC
  Administered 2017-04-08 – 2017-04-11 (×11): 3 mL via RESPIRATORY_TRACT
  Filled 2017-04-08 (×11): qty 3

## 2017-04-08 MED ORDER — INSULIN ASPART 100 UNIT/ML ~~LOC~~ SOLN
0.0000 [IU] | Freq: Three times a day (TID) | SUBCUTANEOUS | Status: DC
  Administered 2017-04-09: 3 [IU] via SUBCUTANEOUS
  Filled 2017-04-08: qty 1

## 2017-04-08 MED ORDER — FUROSEMIDE 20 MG PO TABS
20.0000 mg | ORAL_TABLET | ORAL | Status: DC
  Administered 2017-04-09 – 2017-04-11 (×2): 20 mg via ORAL
  Filled 2017-04-08 (×2): qty 1

## 2017-04-08 NOTE — Progress Notes (Signed)
Patient is admitted to room 249 with the diagnosis of sepsis. The patient is  obtunded and as such this RN was unable to assess her orientation. Dr. Duane Boston notified of patient's condition with a new order to hold all her oral medication for this shift. Admission questionnaire not done and will be done during the day when the family members get to the bedside. This will be communicated to the morning RN. Askin assessment done findings  and noted in Epic.  No s/s of pain or any acute distress. Will continue to monitor.

## 2017-04-08 NOTE — ED Provider Notes (Signed)
patient has gotten IV Levaquin and Rocephin. She is getting a third liter fluid. O2 sats are still 95. Blood pressures in the 155 systolic. She is having occasional runs of A. fib with RVR up to the 170. Hospitalist is aware. We are giving Tylenol and will keep a close eye on her.   Nena Polio, MD 04/08/17 2023

## 2017-04-08 NOTE — ED Notes (Signed)
Admitting MD at bedside.

## 2017-04-08 NOTE — ED Notes (Signed)
CODE  SEPSIS  CALLED  TO  CARELINK 

## 2017-04-08 NOTE — ED Notes (Signed)
Several minutes after report called to floor, pt returned to afib irregular HR from 120-180. EKG captured. Dr. Cinda Quest and Dr. Posey Pronto (admitting MD) aware. Orders placed for Digoxin. Will continue to monitor.

## 2017-04-08 NOTE — H&P (Signed)
[place] [facility] NAME: Jill Copeland    MR#:  [id number]  DATE OF BIRTH:  [birth year]  DATE OF ADMISSION:  04/08/2017  PRIMARY CARE PHYSICIAN: Kathrine Haddock, NP   REQUESTING/REFERRING PHYSICIAN: Dr. Cinda Quest  CHIEF COMPLAINT:   Chief Complaint  Patient presents with  . Altered Mental Status  . Fever    HISTORY OF PRESENT ILLNESS:  Jill Copeland  is a 84 y.o. female with a known history of diabetes mellitus, Alzheimer's, GERD, hypothyroidism, resident of a local nursing home presents to the emergency room due to fever, tachycardia and found to have left lower lobe pneumonia on chest x-ray.  Patient was seen here in the emergency room for weakness yesterday was given IV fluids and discharged back in stable condition.  Today was sent in due to concern with fever. Patient has dementia and unable to contribute to history.  As per nursing staff patient was wheezing with visible shortness of breath on presentation.  She seems a little improved at this time after nebulizer treatment.  PAST MEDICAL HISTORY:   Past Medical History:  Diagnosis Date  . Alzheimer disease   . Arthritis   . Bacteremia    a. 09/2016 - admit w/ S hominis bacteremia w/ ? of pacer lead vegetation;  b. 09/2016 TEE:  unable to fully pass TEE probe due to esoph stricture; c. IV Abx per ID.  . Diabetes mellitus without complication ([place])   . Dysphagia causing pulmonary aspiration with swallowing   . Esophageal stricture    a. @ site of anastamosis from hiatal hernia surgery.  Marland Kitchen GERD (gastroesophageal reflux disease)   . Hiatal hernia    a. s/p prior repair with fistula;  b. 09/2016 CT chest: moderate to large hiatal hernia - stable.  . Hypothyroidism   . Pneumococcal pneumonia ([place])   . Recurrent pneumonia   . Recurrent UTI   . Schizo affective schizophrenia ([place])   . Stroke (cerebrum) ([place])   . Symptomatic bradycardia    a. s/p biotronik PPM 02/2005 at [facility]  in [place], [place]  . Syncope    a. 02/2005 s/p Biotronik DC PPM @ [facility] in [place], [place].    PAST SURGICAL HISTORY:   Past Surgical History:  Procedure Laterality Date  . HERNIA REPAIR     hiatal  . JEJUNOSTOMY FEEDING TUBE    . PACEMAKER INSERTION    . TEE WITHOUT CARDIOVERSION N/A 10/11/2016   Procedure: TRANSESOPHAGEAL ECHOCARDIOGRAM (TEE);  Surgeon: Wellington Hampshire, MD;  Location: [facility] ORS;  Service: Cardiovascular;  Laterality: N/A;    SOCIAL HISTORY:   Social History   Tobacco Use  . Smoking status: Never Smoker  . Smokeless tobacco: Never Used  Substance Use Topics  . Alcohol use: No    Alcohol/week: 0.0 oz    FAMILY HISTORY:   Family History  Problem Relation Age of Onset  . Thyroid cancer Grandchild   . Thyroid nodules Daughter   . Thyroid cancer Daughter   . Graves' disease Daughter   . Dementia Mother   . Schizophrenia Maternal Aunt   . Thyroid disease Son   . Cancer Son   . Cancer Brother   . Cancer Brother     DRUG ALLERGIES:   Penicillin  REVIEW OF SYSTEMS:   Review of Systems  Unable to perform ROS: Dementia    MEDICATIONS AT HOME:   Prior to Admission medications   Medication Sig Start Date End Date  Taking? Authorizing Provider  albuterol (PROVENTIL HFA;VENTOLIN HFA) 108 (90 Base) MCG/ACT inhaler Inhale 2 puffs into the lungs every 6 (six) hours as needed for wheezing or shortness of breath. 03/16/17   Harvest Dark, MD  Cholecalciferol 2000 units TABS Take 2,000 Units by mouth daily.    [provider]  divalproex (DEPAKOTE ER) 250 MG 24 hr tablet Take 1 tablet (250 mg total) by mouth daily. 03/16/17   Harvest Dark, MD  docusate sodium (COLACE) 100 MG capsule Take 100 mg by mouth 2 (two) times daily.    [provider]  ferrous sulfate 325 (65 FE) MG tablet Take 325 mg by mouth daily with breakfast.    [provider]  fluticasone (FLONASE) 50 MCG/ACT nasal spray Place 2 sprays into both  nostrils daily. 03/16/17   Harvest Dark, MD  Fluticasone-Salmeterol (ADVAIR DISKUS) 250-50 MCG/DOSE AEPB Inhale 1 puff into the lungs 2 (two) times daily. 03/16/17   Harvest Dark, MD  furosemide (LASIX) 20 MG tablet Take 1 tablet (20 mg total) by mouth every other day. 03/16/17   Harvest Dark, MD  gabapentin (NEURONTIN) 100 MG capsule Take 2 capsules (200 mg total) by mouth 3 (three) times daily. 03/16/17   Harvest Dark, MD  levothyroxine (SYNTHROID, LEVOTHROID) 125 MCG tablet Take 1 tablet (125 mcg total) by mouth daily before breakfast. 03/16/17   Harvest Dark, MD  loperamide (IMODIUM) 2 MG capsule Take 2 mg by mouth as needed for diarrhea or loose stools.    [provider]  loratadine (CLARITIN) 10 MG tablet Take 10 mg by mouth daily.    [provider]  LORazepam (ATIVAN) 0.5 MG tablet Take 1 tablet (0.5 mg total) by mouth 2 (two) times daily as needed for anxiety. 03/16/17   Harvest Dark, MD  Melatonin 3 MG TABS Take 3 mg by mouth at bedtime.    [provider]  memantine (NAMENDA) 5 MG tablet Take 1 tablet (5 mg total) by mouth 2 (two) times daily. 03/16/17   Harvest Dark, MD  metoprolol tartrate (LOPRESSOR) 25 MG tablet Take 0.5 tablets (12.5 mg total) by mouth 2 (two) times daily. 03/16/17   Harvest Dark, MD  OLANZapine (ZYPREXA) 10 MG tablet Take 0.5 tablets (5 mg total) by mouth 2 (two) times daily. 03/16/17   Harvest Dark, MD  ondansetron (ZOFRAN) 8 MG tablet Take 1 tablet (8 mg total) by mouth every 8 (eight) hours as needed for nausea or vomiting. 03/11/17   Kathrine Haddock, NP  potassium chloride (K-DUR,KLOR-CON) 10 MEQ tablet Take 1 tablet (10 mEq total) by mouth every other day. 03/16/17   Harvest Dark, MD  saccharomyces boulardii (FLORASTOR) 250 MG capsule Take 1 capsule (250 mg total) by mouth 2 (two) times daily. Patient not taking: Reported on 03/21/2017 01/01/15   Hosie Poisson, MD  senna  (SENOKOT) 8.6 MG TABS tablet Take 1 tablet by mouth 2 (two) times a week. Mondays and Fridays    [provider]  sertraline (ZOLOFT) 50 MG tablet Take 1 tablet (50 mg total) by mouth at bedtime. 03/16/17   Harvest Dark, MD  sodium chloride 1 G tablet Take 1 g by mouth 3 (three) times daily.    [provider]     VITAL SIGNS:  Blood pressure (!) 105/49, pulse (!) 103, temperature (!) 103.6 F (39.8 C), temperature source Rectal, resp. rate (!) 21, height 5\' 4"  (1.626 m), weight 54.4 kg (120 lb), SpO2 97 %.  PHYSICAL EXAMINATION:  Physical Exam  GENERAL:  84 y.o.-year-old patient lying in the bed , mild respiratory distress EYES: Pupils equal, round, reactive to light and accommodation. No scleral icterus. Extraocular muscles intact.  HEENT: Head atraumatic, normocephalic. Oropharynx and nasopharynx clear. No oropharyngeal erythema, moist oral mucosa  NECK:  Supple, no jugular venous distention. No thyroid enlargement, no tenderness.  LUNGS: Bilateral wheezing and decreased air entry CARDIOVASCULAR: S1, S2 normal. No murmurs, rubs, or gallops.  ABDOMEN: Soft, nontender, nondistended. Bowel sounds present. No organomegaly or mass.  EXTREMITIES: No pedal edema, cyanosis, or clubbing. + 2 pedal & radial pulses b/l.   NEUROLOGIC: Cranial nerves II through XII are intact. No focal Motor or sensory deficits appreciated b/l PSYCHIATRIC: The patient is alert and awake.  Pleasantly confuse  LABORATORY PANEL:   CBC Recent Labs  Lab 04/08/17 1649  WBC 8.9  HGB 12.6  HCT 38.2  PLT 198   ------------------------------------------------------------------------------------------------------------------  Chemistries  Recent Labs  Lab 04/08/17 1649  NA 137  K 3.8  CL 103  CO2 23  GLUCOSE 158*  BUN 27*  CREATININE 1.03*  CALCIUM 8.3*  AST 128*  ALT 66*  ALKPHOS 443*  BILITOT 0.7    ------------------------------------------------------------------------------------------------------------------  Cardiac Enzymes Recent Labs  Lab 04/08/17 1649  TROPONINI 0.05*   ------------------------------------------------------------------------------------------------------------------  RADIOLOGY:  Dg Chest Portable 1 View  Result Date: 04/08/2017 CLINICAL DATA:  Altered mental status and fever EXAM: PORTABLE CHEST 1 VIEW COMPARISON:  04/07/2017, 03/01/2017, CT chest 10/11/2016 FINDINGS: Left-sided pacing device as before. Tiny pleural effusions. Stable hiatal hernia. Borderline cardiomegaly with vascular congestion and mild diffuse increased interstitial opacity. Patchy atelectasis or minimal infiltrate at the left base. Aortic atherosclerosis. IMPRESSION: 1. Tiny pleural effusions with patchy atelectasis versus minimal infiltrate at the left base 2. Borderline cardiomegaly with mild vascular congestion. Mild diffuse increased interstitial opacity suggests superimposed minimal edema or inflammation on underlying chronic change. Electronically Signed   By: Donavan Foil M.D.   On: 04/08/2017 17:00   Dg Chest Portable 1 View  Result Date: 04/07/2017 CLINICAL DATA:  Syncope. EXAM: PORTABLE CHEST 1 VIEW COMPARISON:  Chest x-ray dated March 01, 2017. FINDINGS: Left chest wall pacer device in unchanged position. The heart size and mediastinal contours are within normal limits. Atherosclerotic calcification of the aortic arch. Normal pulmonary vascularity. Coarsened interstitial markings are again noted, with improved reticulonodular opacities. Chronic blunting of the left costophrenic angle is unchanged. No focal consolidation. No pneumothorax. No large pleural effusion. Unchanged hiatal hernia. No acute osseous abnormality. IMPRESSION: 1. Improved reticulonodular opacities in both lungs. No acute cardiopulmonary disease. Electronically Signed   By: Titus Dubin M.D.   On: 04/07/2017  14:40     IMPRESSION AND PLAN:   *Sepsis with left lower lobe pneumonia.  Temperature 103.4 and tachycardia.  Start patient on IV ceftriaxone and azithromycin.  Patient has tolerated cephalosporins in the past in spite of penicillin allergy.  Blood cultures sent.  Patient also has wheezing and will give her a stat dose of IV Solu-Medrol and scheduled nebulizers.  She has history of bacteremia in the past.  Cultures pending.  *Diabetes mellitus.  Added sliding scale insulin.  Will likely be uncontrolled due to IV steroids.  *Dementia.  Monitor for inpatient delirium  DVT prophylaxis with Lovenox  All the records are reviewed and case discussed with ED provider. Management plans discussed with the patient, family and they are in agreement.  CODE STATUS: FULL CODE  TOTAL TIME TAKING CARE OF THIS PATIENT: 40 minutes.   Jill Copeland  Arlice Colt M.D on 04/08/2017 at 6:02 PM  Between 7am to 6pm - Pager - [phone]  After 6pm go to [email or web address] - password [email or web address] [facility]  Office  [phone]  CC: Primary care physician; Kathrine Haddock, NP  Note: This dictation was prepared with Dragon dictation along with smaller phrase technology. Any transcriptional errors that result from this process are unintentional.

## 2017-04-08 NOTE — ED Provider Notes (Signed)
[facility] Emergency Department Provider Note   ____________________________________________   First MD Initiated Contact with Patient 04/08/17 1627     (approximate)  I have reviewed the triage vital signs and the nursing notes.   HISTORY  Chief Complaint Altered Mental Status and Fever    HPI Jill Copeland is a 84 y.o. female Who comes [facility] with a history of altered mental status waxing and waning consistent with delirium fever 101.5 tachycardia and wheezing. O2 sats were 88% on room air due to all new findings from yesterday when the patient was here for an episode of syncope. Patient's only complaint is that she feels very thirsty. Her mouth is dry.   Past Medical History:  Diagnosis Date  . Alzheimer disease   . Arthritis   . Bacteremia    a. 09/2016 - admit w/ S hominis bacteremia w/ ? of pacer lead vegetation;  b. 09/2016 TEE:  unable to fully pass TEE probe due to esoph stricture; c. IV Abx per ID.  . Diabetes mellitus without complication ([place])   . Dysphagia causing pulmonary aspiration with swallowing   . Esophageal stricture    a. @ site of anastamosis from hiatal hernia surgery.  Marland Kitchen GERD (gastroesophageal reflux disease)   . Hiatal hernia    a. s/p prior repair with fistula;  b. 09/2016 CT chest: moderate to large hiatal hernia - stable.  . Hypothyroidism   . Pneumococcal pneumonia ([place])   . Recurrent pneumonia   . Recurrent UTI   . Schizo affective schizophrenia ([place])   . Stroke (cerebrum) ([place])   . Symptomatic bradycardia    a. s/p biotronik PPM 02/2005 at [facility] in [place], [place]  . Syncope    a. 02/2005 s/p Biotronik DC PPM @ [facility] in [place], [place].    Patient Active Problem List   Diagnosis Date Noted  . Asthma 03/11/2017  . Pain management 03/11/2017  . Advanced care planning/counseling discussion 11/19/2016  . Impaired mobility and ADLs 11/19/2016  . Adventitious breath sounds  11/19/2016  . Pacemaker infection ([place]) 10/11/2016  . Endocarditis 10/09/2016  . Schizoaffective disorder ([place])   . Pressure ulcer 01/13/2015  . Multiple falls 01/12/2015  . Overactive bladder 12/30/2014  . Alzheimer disease   . Schizo affective schizophrenia ([place])   . Recurrent pneumonia   . Dysphagia causing pulmonary aspiration with swallowing   . Pneumococcal pneumonia ([place])   . Arrhythmia   . Esophageal stricture   . Hypothyroidism   . Lung nodule   . Dementia in chronic schizophrenia ([place]) 08/12/2014  . Social discord 08/12/2014  . Family conflict 30/16/0109    Past Surgical History:  Procedure Laterality Date  . HERNIA REPAIR     hiatal  . JEJUNOSTOMY FEEDING TUBE    . PACEMAKER INSERTION    . TEE WITHOUT CARDIOVERSION N/A 10/11/2016   Procedure: TRANSESOPHAGEAL ECHOCARDIOGRAM (TEE);  Surgeon: Wellington Hampshire, MD;  Location: [facility] ORS;  Service: Cardiovascular;  Laterality: N/A;    Prior to Admission medications   Medication Sig Start Date End Date Taking? Authorizing Provider  albuterol (PROVENTIL HFA;VENTOLIN HFA) 108 (90 Base) MCG/ACT inhaler Inhale 2 puffs into the lungs every 6 (six) hours as needed for wheezing or shortness of breath. 03/16/17   Harvest Dark, MD  Cholecalciferol 2000 units TABS Take 2,000 Units by mouth daily.    [provider]  divalproex (DEPAKOTE ER) 250 MG 24 hr tablet Take 1 tablet (250 mg total) by  mouth daily. 03/16/17   Harvest Dark, MD  docusate sodium (COLACE) 100 MG capsule Take 100 mg by mouth 2 (two) times daily.    [provider]  ferrous sulfate 325 (65 FE) MG tablet Take 325 mg by mouth daily with breakfast.    [provider]  fluticasone (FLONASE) 50 MCG/ACT nasal spray Place 2 sprays into both nostrils daily. 03/16/17   Harvest Dark, MD  Fluticasone-Salmeterol (ADVAIR DISKUS) 250-50 MCG/DOSE AEPB Inhale 1 puff into the lungs 2 (two) times daily. 03/16/17   Harvest Dark, MD    furosemide (LASIX) 20 MG tablet Take 1 tablet (20 mg total) by mouth every other day. 03/16/17   Harvest Dark, MD  gabapentin (NEURONTIN) 100 MG capsule Take 2 capsules (200 mg total) by mouth 3 (three) times daily. 03/16/17   Harvest Dark, MD  levothyroxine (SYNTHROID, LEVOTHROID) 125 MCG tablet Take 1 tablet (125 mcg total) by mouth daily before breakfast. 03/16/17   Harvest Dark, MD  loperamide (IMODIUM) 2 MG capsule Take 2 mg by mouth as needed for diarrhea or loose stools.    [provider]  loratadine (CLARITIN) 10 MG tablet Take 10 mg by mouth daily.    [provider]  LORazepam (ATIVAN) 0.5 MG tablet Take 1 tablet (0.5 mg total) by mouth 2 (two) times daily as needed for anxiety. 03/16/17   Harvest Dark, MD  Melatonin 3 MG TABS Take 3 mg by mouth at bedtime.    [provider]  memantine (NAMENDA) 5 MG tablet Take 1 tablet (5 mg total) by mouth 2 (two) times daily. 03/16/17   Harvest Dark, MD  metoprolol tartrate (LOPRESSOR) 25 MG tablet Take 0.5 tablets (12.5 mg total) by mouth 2 (two) times daily. 03/16/17   Harvest Dark, MD  OLANZapine (ZYPREXA) 10 MG tablet Take 0.5 tablets (5 mg total) by mouth 2 (two) times daily. 03/16/17   Harvest Dark, MD  ondansetron (ZOFRAN) 8 MG tablet Take 1 tablet (8 mg total) by mouth every 8 (eight) hours as needed for nausea or vomiting. 03/11/17   Kathrine Haddock, NP  potassium chloride (K-DUR,KLOR-CON) 10 MEQ tablet Take 1 tablet (10 mEq total) by mouth every other day. 03/16/17   Harvest Dark, MD  saccharomyces boulardii (FLORASTOR) 250 MG capsule Take 1 capsule (250 mg total) by mouth 2 (two) times daily. Patient not taking: Reported on 03/21/2017 01/01/15   Hosie Poisson, MD  senna (SENOKOT) 8.6 MG TABS tablet Take 1 tablet by mouth 2 (two) times a week. Mondays and Fridays    [provider]  sertraline (ZOLOFT) 50 MG tablet Take 1 tablet (50 mg total) by mouth at  bedtime. 03/16/17   Harvest Dark, MD  sodium chloride 1 G tablet Take 1 g by mouth 3 (three) times daily.    [provider]    Allergies Penicillins  Family History  Problem Relation Age of Onset  . Thyroid cancer Grandchild   . Thyroid nodules Daughter   . Thyroid cancer Daughter   . Graves' disease Daughter   . Dementia Mother   . Schizophrenia Maternal Aunt   . Thyroid disease Son   . Cancer Son   . Cancer Brother   . Cancer Brother     Social History Social History   Tobacco Use  . Smoking status: Never Smoker  . Smokeless tobacco: Never Used  Substance Use Topics  . Alcohol use: No    Alcohol/week: 0.0 oz  . Drug use: No  Review of Systems  Constitutional: fever/chills Eyes: No visual changes. ENT: No sore throat. Cardiovascular: Denies chest pain. Respiratory: Denies shortness of breath. Gastrointestinal: No abdominal pain.  No nausea, no vomiting.  No diarrhea.  No constipation. Genitourinary: Negative for dysuria. Musculoskeletal: Negative for back pain. Skin: Negative for rash. Neurological: Negative for headaches, focal weakness  ____________________________________________   PHYSICAL EXAM:  VITAL SIGNS: ED Triage Vitals  Enc Vitals Group     BP      Pulse      Resp      Temp      Temp src      SpO2      Weight      Height      Head Circumference      Peak Flow      Pain Score      Pain Loc      Pain Edu?      Excl. in [place]?     Constitutional: Alert  Well appearing and in no acute distress. Eyes: Conjunctivae are normal.  Head: Atraumatic. Nose: No congestion/rhinnorhea. Mouth/Throat: Mucous membranes are dry  Oropharynx non-erythematous. Neck: No stridor.   Cardiovascular: Normal rate, regular rhythm. Grossly normal heart sounds.  Good peripheral circulation. Respiratory: Normal respiratory effort.  No retractions. Lungs wheezes throughout Gastrointestinal: Soft and nontender. No distention. No abdominal bruits.  No CVA tenderness. Musculoskeletal: No lower extremity tenderness nor edema.  No joint effusions. Neurologic:  Normal speech and language. No gross focal neurologic deficits are appreciated. No gait instability. Skin:  Skin is warm, dry there are bruises and abrasions all of which appear old scattered over her arms No rash noted.   ____________________________________________   LABS (all labs ordered are listed, but only abnormal results are displayed)  Labs Reviewed  COMPREHENSIVE METABOLIC PANEL - Abnormal; Notable for the following components:      Result Value   Glucose, Bld 158 (*)    BUN 27 (*)    Creatinine, Ser 1.03 (*)    Calcium 8.3 (*)    Albumin 2.9 (*)    AST 128 (*)    ALT 66 (*)    Alkaline Phosphatase 443 (*)    GFR calc non Af Amer 49 (*)    GFR calc Af Amer 57 (*)    All other components within normal limits  TROPONIN I - Abnormal; Notable for the following components:   Troponin I 0.05 (*)    All other components within normal limits  CBC WITH DIFFERENTIAL/PLATELET - Abnormal; Notable for the following components:   RDW 14.8 (*)    Neutro Abs 8.1 (*)    Lymphs Abs 0.3 (*)    All other components within normal limits  CULTURE, BLOOD (ROUTINE X 2)  CULTURE, BLOOD (ROUTINE X 2)  LACTIC ACID, PLASMA  URINALYSIS, ROUTINE W REFLEX MICROSCOPIC  LACTIC ACID, PLASMA   ____________________________________________  EKG EKG read and interpreted by me shows sinus tachycardia rate of 136 there are frequent he remained sure supraventricular beats. I cannot say that I see any acute ST T changes in the baseline is very irregular. There may be some ST segment depression in the lateral V leads ____________________________________________  RADIOLOGY  chest x-ray may show some pneumonia.  ____________________________________________   PROCEDURES  Procedure(s) performed:  Procedures  Critical Care performed:    ____________________________________________   INITIAL IMPRESSION / ASSESSMENT AND PLAN / ED COURSE patient with tachycardia high fever altered mental status we will treat for sepsis patient  is allergic to penicillin and therefore I will use Levaquin. Urine is still pending but the chest x-ray is suspicious for pneumonia as is the clinical presentation of decreased oxygen saturation      ____________________________________________   FINAL CLINICAL IMPRESSION(S) / ED DIAGNOSES  Final diagnoses:  Confusion  Sepsis, due to unspecified organism [facility])     ED Discharge Orders    None       Note:  This document was prepared using Dragon voice recognition software and may include unintentional dictation errors.    Nena Polio, MD 04/08/17 [phone]

## 2017-04-08 NOTE — ED Triage Notes (Signed)
She arrives today via [facility] from [facility]  - she presents with altered mental status and fever  Pt seen here yesterday and discharged  Staff reports fever spike overnight and today  Pt with baseline memory loss  Rectal temp 103.6 upon arrival

## 2017-04-09 LAB — BASIC METABOLIC PANEL
ANION GAP: 9 (ref 5–15)
BUN: 23 mg/dL — ABNORMAL HIGH (ref 6–20)
CALCIUM: 7.9 mg/dL — AB (ref 8.9–10.3)
CO2: 22 mmol/L (ref 22–32)
Chloride: 108 mmol/L (ref 101–111)
Creatinine, Ser: 0.86 mg/dL (ref 0.44–1.00)
Glucose, Bld: 133 mg/dL — ABNORMAL HIGH (ref 65–99)
POTASSIUM: 4.5 mmol/L (ref 3.5–5.1)
SODIUM: 139 mmol/L (ref 135–145)

## 2017-04-09 LAB — CBC
HEMATOCRIT: 35 % (ref 35.0–47.0)
HEMOGLOBIN: 11.5 g/dL — AB (ref 12.0–16.0)
MCH: 29.9 pg (ref 26.0–34.0)
MCHC: 32.8 g/dL (ref 32.0–36.0)
MCV: 91.1 fL (ref 80.0–100.0)
Platelets: 172 10*3/uL (ref 150–440)
RBC: 3.84 MIL/uL (ref 3.80–5.20)
RDW: 14.9 % — ABNORMAL HIGH (ref 11.5–14.5)
WBC: 7.1 10*3/uL (ref 3.6–11.0)

## 2017-04-09 LAB — GLUCOSE, CAPILLARY
GLUCOSE-CAPILLARY: 97 mg/dL (ref 65–99)
GLUCOSE-CAPILLARY: 165 mg/dL — AB (ref 65–99)
Glucose-Capillary: 87 mg/dL (ref 65–99)
Glucose-Capillary: 107 mg/dL — ABNORMAL HIGH (ref 65–99)

## 2017-04-09 LAB — MRSA PCR SCREENING: MRSA by PCR: POSITIVE — AB

## 2017-04-09 LAB — LACTIC ACID, PLASMA
Lactic Acid, Venous: 2.4 mmol/L (ref 0.5–1.9)
Lactic Acid, Venous: 4.2 mmol/L (ref 0.5–1.9)

## 2017-04-09 MED ORDER — NON FORMULARY: 3.0000 mg | Freq: Every day | Status: DC

## 2017-04-09 MED ORDER — MELATONIN 5 MG PO TABS
5.0000 mg | ORAL_TABLET | Freq: Every day | ORAL | Status: DC
  Administered 2017-04-09 – 2017-04-10 (×2): 5 mg via ORAL
  Filled 2017-04-09 (×3): qty 1

## 2017-04-09 MED ORDER — DEXTROSE 5 % IV SOLN
1.0000 g | INTRAVENOUS | Status: DC
  Administered 2017-04-10: 1 g via INTRAVENOUS
  Filled 2017-04-09 (×2): qty 10

## 2017-04-09 MED ORDER — TRAZODONE HCL 50 MG PO TABS
50.0000 mg | ORAL_TABLET | Freq: Every day | ORAL | Status: DC
  Administered 2017-04-09 – 2017-04-10 (×2): 50 mg via ORAL
  Filled 2017-04-09 (×3): qty 1

## 2017-04-09 MED ORDER — MUPIROCIN 2 % EX OINT
TOPICAL_OINTMENT | Freq: Two times a day (BID) | CUTANEOUS | Status: DC
  Administered 2017-04-09 – 2017-04-11 (×5): via NASAL
  Filled 2017-04-09 (×2): qty 22

## 2017-04-09 MED ORDER — SODIUM CHLORIDE 0.9 % IV SOLN
INTRAVENOUS | Status: DC
  Administered 2017-04-09 – 2017-04-10 (×2): via INTRAVENOUS

## 2017-04-09 NOTE — Progress Notes (Signed)
Pt complaining of her heart hurting. MD notified. Orders to give tylenol. I will continue to assess.

## 2017-04-09 NOTE — Progress Notes (Signed)
The patient is still on her call bell begging for sleeping medication. Apparently the  Melatonin did not work. Dr. Duane Boston notified with a new order for Trazodone 50 mg oral at bedtime. Will give and continue to monitor.

## 2017-04-09 NOTE — Progress Notes (Signed)
The patien has bed impulsive at this time and staying on her call bell constantly. She's requesting for something to sleep. Dr. Anselm Jungling notified with a new order to initiate her melatonin 3 mg oral at bedtime. Will continue to monitor.

## 2017-04-09 NOTE — Evaluation (Addendum)
Physical Therapy Evaluation Patient Details Name: Jill Copeland MRN: [id number] DOB: 1931-05-06 Today's Date: 04/09/2017   History of Present Illness  Jill Copeland is an 84yo white female who comes to [facility] on 04/08/17 d/t fever/tachycardia found to be septic. Pt was at ED 1 day prior and sent home with treatment for PNA. PMH: DM2, alzheimer's dementia, GERD. PLOF limited at this time d/t no family in room and baselien cognition deficits.   Clinical Impression  Pt admitted with above diagnosis. Pt currently with functional limitations due to the deficits listed below (see "PT Problem List"). Upon entry, the patient is received seated at edge of bed, no family/caregiver present, finishing toiletting with NA. The pt is awake and motivated to participate, but appears mildly-moderate anxious and impulsive upon entry. The patient demonstrates mild perseveration and tangential speech, with intermittent redirection required.   The pt is oriented to self, but is otherwise confused and inconsistent with answering most questions. Pt received on 2LPM O2, but SpO2: 98% on room air at end of session. Functional mobility assessment demonstrates mildstrength impairment in trunk and BLE AEB effort required to perform bed mobility and transfers, however functional activity toelrance is more limited AEB tolerance of AMB less than 20 feet, whereas the patient performed these at a higher level of independence PTA per medical record and patients last acute PT eval 2WA. Pt will benefit from skilled PT intervention to increase independence and safety with basic mobility in preparation for discharge to the venue listed below.       Follow Up Recommendations SNF;Supervision/Assistance - 24 hour    Equipment Recommendations  None recommended by PT    Recommendations for Other Services       Precautions / Restrictions Precautions Precautions: Fall Restrictions Weight Bearing Restrictions: No      Mobility  Bed  Mobility Overal bed mobility: (received sitting EOB with NA )                Transfers Overall transfer level: Needs assistance Equipment used: 1 person hand held assist Transfers: Sit to/from Stand Sit to Stand: Min guard         General transfer comment: SPT from [facility] to bed, STS xfer from bed to AMB in room, then to don undergarment with NA, then to adjust gown in chair. minimal to moderate effort required, but tolerance limited to less than 90 seconds prior to anxious verbalized need to return to sitting.   Ambulation/Gait Ambulation/Gait assistance: Min guard   Assistive device: 1 person hand held assist Gait Pattern/deviations: Shuffle     General Gait Details: from bedside right to bedside left wth increased anxious verbal need to sit when halfway.   Stairs            Wheelchair Mobility    Modified Rankin (Stroke Patients Only)       Balance Overall balance assessment: Needs assistance;History of Falls Sitting-balance support: No upper extremity supported;Feet supported Sitting balance-Leahy Scale: Good     Standing balance support: Bilateral upper extremity supported;During functional activity Standing balance-Leahy Scale: Fair                               Pertinent Vitals/Pain Pain Assessment: No/denies pain    Home Living Family/patient expects to be discharged to:: Skilled nursing facility(pt recently admitting to [facility] after last admission 2 WA  )  Prior Function                 Hand Dominance        Extremity/Trunk Assessment   Upper Extremity Assessment Upper Extremity Assessment: Generalized weakness    Lower Extremity Assessment Lower Extremity Assessment: Generalized weakness       Communication      Cognition Arousal/Alertness: Awake/alert Behavior During Therapy: Restless;Anxious                                   General Comments: Per chart  review, pt with alzheimer's dementia. Pt perseverates on returning to bed and speaking to doctor, similar to last admisson 2WA; requires frequent redirection to attend to task.       General Comments      Exercises     Assessment/Plan    PT Assessment Patient needs continued PT services  PT Problem List Decreased strength;Decreased activity tolerance;Decreased mobility;Decreased safety awareness;Decreased knowledge of use of DME;Decreased cognition;Pain       PT Treatment Interventions Gait training;Balance training;Functional mobility training;Therapeutic activities;Therapeutic exercise    PT Goals (Current goals can be found in the Care Plan section)  Acute Rehab PT Goals Patient Stated Goal: pt unable to participate in goal setting PT Goal Formulation: Patient unable to participate in goal setting    Frequency Min 2X/week   Barriers to discharge        Co-evaluation               AM-PAC PT "6 Clicks" Daily Activity  Outcome Measure Difficulty turning over in bed (including adjusting bedclothes, sheets and blankets)?: A Little Difficulty moving from lying on back to sitting on the side of the bed? : A Lot Difficulty sitting down on and standing up from a chair with arms (e.g., wheelchair, bedside commode, etc,.)?: A Little Help needed moving to and from a bed to chair (including a wheelchair)?: A Little Help needed walking in hospital room?: A Lot Help needed climbing 3-5 steps with a railing? : A Lot 6 Click Score: 15    End of Session   Activity Tolerance: Patient limited by fatigue;Patient tolerated treatment well(limited by observed anxiety, but pt unable to vocalize complaints well) Patient left: in chair;with call bell/phone within reach;with chair alarm set Nurse Communication: Mobility status(O2 satting well on room air, taken off O2) PT Visit Diagnosis: Unsteadiness on feet (R26.81);Other abnormalities of gait and mobility (R26.89);Muscle weakness  (generalized) (M62.81)    Time: 6606-3016 PT Time Calculation (min) (ACUTE ONLY): 14 min   Charges:   PT Evaluation $PT Eval Moderate Complexity: 1 Mod     PT G Codes:        11:06 AM, 2017-04-25 Etta Grandchild, PT, DPT Physical Therapist - [place] [phone] ([place])  [phone] (mobile)   Colletta Spillers C 04/25/17, 11:01 AM

## 2017-04-09 NOTE — Progress Notes (Signed)
MD notified via amion about patients lactic acid level of 2.4 I will await any new orders and continue to assess.

## 2017-04-09 NOTE — Progress Notes (Signed)
[place] at [place] NAME: Jill Copeland    MR#:  [id number]  DATE OF BIRTH:  08-14-1931  SUBJECTIVE:  Patient is out in the chair.  She keeps repeating herself she is doing well and wants to go home Daughter in the room Issues per RN.  Patient is a poor historian she has underlying dementia  REVIEW OF SYSTEMS:   Review of Systems  Unable to perform ROS: Dementia   Tolerating Diet:yes Tolerating PT: pending  DRUG ALLERGIES:   Allergies  Allergen Reactions  . Penicillins Other (See Comments)    Has patient had a PCN reaction causing immediate rash, facial/tongue/throat swelling, SOB or lightheadedness with hypotension: Unknown Has patient had a PCN reaction causing severe rash involving mucus membranes or skin necrosis: Unknown Has patient had a PCN reaction that required hospitalization: Unknown Has patient had a PCN reaction occurring within the last 10 years: Unknown If all of the above answers are "NO", then may proceed with Cephalosporin use.     VITALS:  Blood pressure (!) 159/63, pulse 92, temperature 98.5 F (36.9 C), resp. rate 16, height 5\' 4"  (1.626 m), weight 59.4 kg (131 lb), SpO2 98 %.  PHYSICAL EXAMINATION:   Physical Exam  GENERAL:  84 y.o.-year-old patient lying in the bed with no acute distress.  EYES: Pupils equal, round, reactive to light and accommodation. No scleral icterus. Extraocular muscles intact.  HEENT: Head atraumatic, normocephalic. Oropharynx and nasopharynx clear.  NECK:  Supple, no jugular venous distention. No thyroid enlargement, no tenderness.  LUNGS: Normal breath sounds bilaterally, no wheezing, rales, rhonchi. No use of accessory muscles of respiration.  CARDIOVASCULAR: S1, S2 normal. No murmurs, rubs, or gallops.  ABDOMEN: Soft, nontender, nondistended. Bowel sounds present. No organomegaly or mass.  EXTREMITIES: No cyanosis, clubbing or edema b/l.    NEUROLOGIC: Cranial nerves II  through XII are intact. No focal Motor or sensory deficits b/l.   PSYCHIATRIC:  patient is alert and oriented x 3.  SKIN: No obvious rash, lesion, or ulcer.   LABORATORY PANEL:  CBC Recent Labs  Lab 04/09/17 0456  WBC 7.1  HGB 11.5*  HCT 35.0  PLT 172    Chemistries  Recent Labs  Lab 04/08/17 1649 04/09/17 0456  NA 137 139  K 3.8 4.5  CL 103 108  CO2 23 22  GLUCOSE 158* 133*  BUN 27* 23*  CREATININE 1.03* 0.86  CALCIUM 8.3* 7.9*  AST 128*  --   ALT 66*  --   ALKPHOS 443*  --   BILITOT 0.7  --    Cardiac Enzymes Recent Labs  Lab 04/08/17 1649  TROPONINI 0.05*   RADIOLOGY:  Dg Chest Portable 1 View  Result Date: 04/08/2017 CLINICAL DATA:  Altered mental status and fever EXAM: PORTABLE CHEST 1 VIEW COMPARISON:  04/07/2017, 03/01/2017, CT chest 10/11/2016 FINDINGS: Left-sided pacing device as before. Tiny pleural effusions. Stable hiatal hernia. Borderline cardiomegaly with vascular congestion and mild diffuse increased interstitial opacity. Patchy atelectasis or minimal infiltrate at the left base. Aortic atherosclerosis. IMPRESSION: 1. Tiny pleural effusions with patchy atelectasis versus minimal infiltrate at the left base 2. Borderline cardiomegaly with mild vascular congestion. Mild diffuse increased interstitial opacity suggests superimposed minimal edema or inflammation on underlying chronic change. Electronically Signed   By: Donavan Foil M.D.   On: 04/08/2017 17:00   Dg Chest Portable 1 View  Result Date: 04/07/2017 CLINICAL DATA:  Syncope. EXAM: PORTABLE CHEST 1 VIEW COMPARISON:  Chest x-ray dated March 01, 2017. FINDINGS: Left chest wall pacer device in unchanged position. The heart size and mediastinal contours are within normal limits. Atherosclerotic calcification of the aortic arch. Normal pulmonary vascularity. Coarsened interstitial markings are again noted, with improved reticulonodular opacities. Chronic blunting of the left costophrenic angle is  unchanged. No focal consolidation. No pneumothorax. No large pleural effusion. Unchanged hiatal hernia. No acute osseous abnormality. IMPRESSION: 1. Improved reticulonodular opacities in both lungs. No acute cardiopulmonary disease. Electronically Signed   By: Titus Dubin M.D.   On: 04/07/2017 14:40   ASSESSMENT AND PLAN:   Jill Copeland  is a 84 y.o. female with a known history of diabetes mellitus, Alzheimer's, GERD, hypothyroidism, resident of a local nursing home presents to the emergency room due to fever, tachycardia and found to have left lower lobe pneumonia on chest x-ray.   *Sepsis with left lower lobe pneumonia.  Temperature 103.4 and tachycardia.  -  on IV ceftriaxone and azithromycin.  Patient has tolerated cephalosporins in the past in spite of penicillin allergy.   -Blood cultures negative.  -Patient not in respiratory distress sats are more than 92% on room air.  No indication for further dosing of steroids. She has history of bacteremia in the past.    *Diabetes mellitus.  Added sliding scale insulin.    *Dementia.  Monitor for inpatient delirium  DVT prophylaxis with Lovenox  Spoke with daughter.  Case discussed with Care Management/Social Worker. Management plans discussed with the patient, family and they are in agreement.  CODE STATUS: Full  DVT Prophylaxis: Lovenox  TOTAL TIME TAKING CARE OF THIS PATIENT: 30 minutes.  >50% time spent on counselling and coordination of care  POSSIBLE D/C IN 1-2 DAYS, DEPENDING ON CLINICAL CONDITION.  Note: This dictation was prepared with Dragon dictation along with smaller phrase technology. Any transcriptional errors that result from this process are unintentional.  Fritzi Mandes M.D on 04/09/2017 at 12:42 PM  Between 7am to 6pm - Pager - [phone]  After 6pm go to [email or web address] - password [email or web address] [facility]  Office  [phone]  CC: Primary care physician; Kathrine Haddock, NPPatient ID:  Jill Copeland, female   DOB: 03-23-1931, 84 y.o.   MRN: [id number]

## 2017-04-09 NOTE — Progress Notes (Signed)
Pt has exp wheeze, complains of heart pain and trying to get up. She is anxious and using call bell to call out every few minutes. VSS. Oxygen resumed at 2 L, prn neb, tylenol and ativan given. I will continue to access.

## 2017-04-09 NOTE — Progress Notes (Signed)
MD notified of patients lactic acid of 4.2 MD orders IVF at 100, repeat lactic acid and CBC in the a.m. I will continue to assess.

## 2017-04-09 NOTE — Progress Notes (Signed)
The patient is awake, alert and oriented x 3 with confusion. Unable to answer most of the admission questions and still has to be done when the family gets to be bedside. The patient was able to use the bedside commode  with no incident. Stated she want  to talk to the doctor now. Will continue to monitor.

## 2017-04-10 LAB — GLUCOSE, CAPILLARY
GLUCOSE-CAPILLARY: 113 mg/dL — AB (ref 65–99)
GLUCOSE-CAPILLARY: 113 mg/dL — AB (ref 65–99)
Glucose-Capillary: 107 mg/dL — ABNORMAL HIGH (ref 65–99)
Glucose-Capillary: 117 mg/dL — ABNORMAL HIGH (ref 65–99)

## 2017-04-10 LAB — CBC
HEMATOCRIT: 32.2 % — AB (ref 35.0–47.0)
HEMOGLOBIN: 10.5 g/dL — AB (ref 12.0–16.0)
MCH: 29.5 pg (ref 26.0–34.0)
MCHC: 32.7 g/dL (ref 32.0–36.0)
MCV: 90.1 fL (ref 80.0–100.0)
Platelets: 174 10*3/uL (ref 150–440)
RBC: 3.57 MIL/uL — AB (ref 3.80–5.20)
RDW: 14.7 % — ABNORMAL HIGH (ref 11.5–14.5)
WBC: 3.8 10*3/uL (ref 3.6–11.0)

## 2017-04-10 LAB — LACTIC ACID, PLASMA: Lactic Acid, Venous: 1.4 mmol/L (ref 0.5–1.9)

## 2017-04-10 NOTE — Progress Notes (Signed)
[place] at [place] NAME: Jill Copeland    MR#:  [id number]  DATE OF BIRTH:  01-18-1932  SUBJECTIVE:  Patient is resting  She keeps repeating herself she is doing well and wants to go home. No new Issues per RN.  Patient is a poor historian she has underlying dementia  REVIEW OF SYSTEMS:   Review of Systems  Unable to perform ROS: Dementia   Tolerating Diet:yes Tolerating PT: rehab  DRUG ALLERGIES:   Allergies  Allergen Reactions  . Penicillins Other (See Comments)    Has patient had a PCN reaction causing immediate rash, facial/tongue/throat swelling, SOB or lightheadedness with hypotension: Unknown Has patient had a PCN reaction causing severe rash involving mucus membranes or skin necrosis: Unknown Has patient had a PCN reaction that required hospitalization: Unknown Has patient had a PCN reaction occurring within the last 10 years: Unknown If all of the above answers are "NO", then may proceed with Cephalosporin use.     VITALS:  Blood pressure (!) 144/62, pulse 85, temperature 98.1 F (36.7 C), resp. rate 14, height 5\' 4"  (1.626 m), weight 60.3 kg (132 lb 14.4 oz), SpO2 95 %.  PHYSICAL EXAMINATION:   Physical Exam  GENERAL:  84 y.o.-year-old patient lying in the bed with no acute distress.  EYES: Pupils equal, round, reactive to light and accommodation. No scleral icterus. Extraocular muscles intact.  HEENT: Head atraumatic, normocephalic. Oropharynx and nasopharynx clear.  NECK:  Supple, no jugular venous distention. No thyroid enlargement, no tenderness.  LUNGS: Normal breath sounds bilaterally, no wheezing, rales, rhonchi. No use of accessory muscles of respiration.  CARDIOVASCULAR: S1, S2 normal. No murmurs, rubs, or gallops.  ABDOMEN: Soft, nontender, nondistended. Bowel sounds present. No organomegaly or mass.  EXTREMITIES: No cyanosis, clubbing or edema b/l.    NEUROLOGIC: Cranial nerves II through XII are  intact. No focal Motor or sensory deficits b/l.   PSYCHIATRIC:  patient is alert and oriented x 3.  SKIN: No obvious rash, lesion, or ulcer.   LABORATORY PANEL:  CBC Recent Labs  Lab 04/10/17 0528  WBC 3.8  HGB 10.5*  HCT 32.2*  PLT 174    Chemistries  Recent Labs  Lab 04/08/17 1649 04/09/17 0456  NA 137 139  K 3.8 4.5  CL 103 108  CO2 23 22  GLUCOSE 158* 133*  BUN 27* 23*  CREATININE 1.03* 0.86  CALCIUM 8.3* 7.9*  AST 128*  --   ALT 66*  --   ALKPHOS 443*  --   BILITOT 0.7  --    Cardiac Enzymes Recent Labs  Lab 04/08/17 1649  TROPONINI 0.05*   RADIOLOGY:  Dg Chest Portable 1 View  Result Date: 04/08/2017 CLINICAL DATA:  Altered mental status and fever EXAM: PORTABLE CHEST 1 VIEW COMPARISON:  04/07/2017, 03/01/2017, CT chest 10/11/2016 FINDINGS: Left-sided pacing device as before. Tiny pleural effusions. Stable hiatal hernia. Borderline cardiomegaly with vascular congestion and mild diffuse increased interstitial opacity. Patchy atelectasis or minimal infiltrate at the left base. Aortic atherosclerosis. IMPRESSION: 1. Tiny pleural effusions with patchy atelectasis versus minimal infiltrate at the left base 2. Borderline cardiomegaly with mild vascular congestion. Mild diffuse increased interstitial opacity suggests superimposed minimal edema or inflammation on underlying chronic change. Electronically Signed   By: Donavan Foil M.D.   On: 04/08/2017 17:00   ASSESSMENT AND PLAN:   Jill Copeland  is a 84 y.o. female with a known history of diabetes mellitus, Alzheimer's, GERD, hypothyroidism,  resident of a local nursing home presents to the emergency room due to fever, tachycardia and found to have left lower lobe pneumonia on chest x-ray.   *Sepsis with left lower lobe pneumonia.  Temperature 103.4 and tachycardia.  - on IV ceftriaxone and azithromycin.  Patient has tolerated cephalosporins in the past in spite of penicillin allergy.   -Blood cultures negative.   -Patient not in respiratory distress sats are more than 92% on room air.  No indication for further dosing of steroids. She has history of bacteremia in the past.   -LA levels back to normal  *Diabetes mellitus.  Added sliding scale insulin.    *Dementia.  Monitor for inpatient delirium  DVT prophylaxis with Lovenox  Spoke with daughter.  D/c to rehab in am if remains stblae  Case discussed with Care Management/Social Worker. Management plans discussed with the patient, family and they are in agreement.  CODE STATUS: Full  DVT Prophylaxis: Lovenox  TOTAL TIME TAKING CARE OF THIS PATIENT: 30 minutes.  >50% time spent on counselling and coordination of care  POSSIBLE D/C IN 1-2 DAYS, DEPENDING ON CLINICAL CONDITION.  Note: This dictation was prepared with Dragon dictation along with smaller phrase technology. Any transcriptional errors that result from this process are unintentional.  Fritzi Mandes M.D on 04/10/2017 at 1:49 PM  Between 7am to 6pm - Pager - [phone]  After 6pm go to [email or web address] - password [email or web address] [facility]  Office  [phone]  CC: Primary care physician; Kathrine Haddock, NPPatient ID: Jill Copeland, female   DOB: [birth year], 84 y.o.   MRN: [id number]

## 2017-04-11 ENCOUNTER — Other Ambulatory Visit: Payer: Self-pay

## 2017-04-11 DIAGNOSIS — I10 Essential (primary) hypertension: Secondary | ICD-10-CM | POA: Diagnosis not present

## 2017-04-11 DIAGNOSIS — J181 Lobar pneumonia, unspecified organism: Secondary | ICD-10-CM | POA: Diagnosis not present

## 2017-04-11 DIAGNOSIS — J189 Pneumonia, unspecified organism: Secondary | ICD-10-CM | POA: Diagnosis not present

## 2017-04-11 DIAGNOSIS — J129 Viral pneumonia, unspecified: Secondary | ICD-10-CM | POA: Diagnosis not present

## 2017-04-11 DIAGNOSIS — R748 Abnormal levels of other serum enzymes: Secondary | ICD-10-CM | POA: Diagnosis not present

## 2017-04-11 DIAGNOSIS — G4700 Insomnia, unspecified: Secondary | ICD-10-CM | POA: Diagnosis not present

## 2017-04-11 DIAGNOSIS — R2681 Unsteadiness on feet: Secondary | ICD-10-CM | POA: Diagnosis not present

## 2017-04-11 DIAGNOSIS — E119 Type 2 diabetes mellitus without complications: Secondary | ICD-10-CM | POA: Diagnosis not present

## 2017-04-11 DIAGNOSIS — R2689 Other abnormalities of gait and mobility: Secondary | ICD-10-CM | POA: Diagnosis not present

## 2017-04-11 DIAGNOSIS — R1312 Dysphagia, oropharyngeal phase: Secondary | ICD-10-CM | POA: Diagnosis not present

## 2017-04-11 DIAGNOSIS — Z09 Encounter for follow-up examination after completed treatment for conditions other than malignant neoplasm: Secondary | ICD-10-CM | POA: Diagnosis not present

## 2017-04-11 DIAGNOSIS — N390 Urinary tract infection, site not specified: Secondary | ICD-10-CM | POA: Diagnosis not present

## 2017-04-11 DIAGNOSIS — E039 Hypothyroidism, unspecified: Secondary | ICD-10-CM | POA: Diagnosis not present

## 2017-04-11 DIAGNOSIS — A419 Sepsis, unspecified organism: Secondary | ICD-10-CM | POA: Diagnosis not present

## 2017-04-11 DIAGNOSIS — M6281 Muscle weakness (generalized): Secondary | ICD-10-CM | POA: Diagnosis not present

## 2017-04-11 DIAGNOSIS — D649 Anemia, unspecified: Secondary | ICD-10-CM | POA: Diagnosis not present

## 2017-04-11 LAB — GLUCOSE, CAPILLARY
GLUCOSE-CAPILLARY: 112 mg/dL — AB (ref 65–99)
GLUCOSE-CAPILLARY: 122 mg/dL — AB (ref 65–99)
Glucose-Capillary: 118 mg/dL — ABNORMAL HIGH (ref 65–99)

## 2017-04-11 MED ORDER — CEFUROXIME AXETIL 500 MG PO TABS
500.0000 mg | ORAL_TABLET | Freq: Two times a day (BID) | ORAL | Status: DC
  Administered 2017-04-11: 500 mg via ORAL
  Filled 2017-04-11 (×3): qty 1

## 2017-04-11 MED ORDER — AZITHROMYCIN 500 MG PO TABS: 500.0000 mg | ORAL_TABLET | Freq: Every day | ORAL | 0 refills | Status: DC

## 2017-04-11 MED ORDER — INSULIN ASPART 100 UNIT/ML ~~LOC~~ SOLN: 0.0000 [IU] | Freq: Three times a day (TID) | SUBCUTANEOUS | 11 refills | Status: AC

## 2017-04-11 MED ORDER — INSULIN ASPART 100 UNIT/ML ~~LOC~~ SOLN: 0.0000 [IU] | Freq: Three times a day (TID) | SUBCUTANEOUS | Status: DC

## 2017-04-11 MED ORDER — CEFUROXIME AXETIL 500 MG PO TABS: 500.0000 mg | ORAL_TABLET | Freq: Two times a day (BID) | ORAL | 0 refills | Status: AC

## 2017-04-11 NOTE — Discharge Summary (Addendum)
[facility] [place] NAME: Jill Copeland    MR#:  [id number]  DATE OF BIRTH:  04/12/1931  DATE OF ADMISSION:  04/08/2017 ADMITTING PHYSICIAN: Dustin Flock, MD  DATE OF DISCHARGE: 04/11/2017  PRIMARY CARE PHYSICIAN: Kathrine Haddock, NP    ADMISSION DIAGNOSIS:  Confusion [R41.0] Sepsis, due to unspecified organism ([place]) [A41.9] A-fib ([place]) [I48.91]  DISCHARGE DIAGNOSIS:  Sepsis due to LL pneumonia  SECONDARY DIAGNOSIS:   Past Medical History:  Diagnosis Date  . Alzheimer disease   . Arthritis   . Bacteremia    a. 09/2016 - admit w/ S hominis bacteremia w/ ? of pacer lead vegetation;  b. 09/2016 TEE:  unable to fully pass TEE probe due to esoph stricture; c. IV Abx per ID.  . Diabetes mellitus without complication ([place])   . Dysphagia causing pulmonary aspiration with swallowing   . Esophageal stricture    a. @ site of anastamosis from hiatal hernia surgery.  Marland Kitchen GERD (gastroesophageal reflux disease)   . Hiatal hernia    a. s/p prior repair with fistula;  b. 09/2016 CT chest: moderate to large hiatal hernia - stable.  . Hypothyroidism   . Pneumococcal pneumonia ([place])   . Recurrent pneumonia   . Recurrent UTI   . Schizo affective schizophrenia ([place])   . Stroke (cerebrum) ([place])   . Symptomatic bradycardia    a. s/p biotronik PPM 02/2005 at [facility] in [place], [place]  . Syncope    a. 02/2005 s/p Biotronik DC PPM @ [facility] in [place], [place]:   [name] a84 y.o.femalewith a known history of diabetes mellitus, Alzheimer's, GERD, hypothyroidism, resident of a local nursing home presents to the emergency room due to fever, tachycardia and found to have left lower lobe pneumonia on chest x-ray.   *Sepsis with left lower lobe pneumonia.came in withTemperature 103.4 and tachycardia.  -on IV ceftriaxone and azithromycin--change to oral abxs -Patient has tolerated cephalosporins in the  past in spite of penicillin allergy.  -Blood cultures negative.  -Patient not in respiratory distress sats are more than 92% on room air.  No indication for further dosing of steroids. -LA levels back to normal -afebrile  *Diabetes mellitus. Added sliding scale insulin.  -not on any home meds -sugars stable  *Dementia. Cont namenda  DVT prophylaxis with Lovenox  Overall better.Oxygen as needed D/c to rehab today if [facility] obtained.  DISCHARGE MEDICATIONS:   Allergies as of 04/11/2017      Reactions   Penicillins Other (See Comments)   Has patient had a PCN reaction causing immediate rash, facial/tongue/throat swelling, SOB or lightheadedness with hypotension: Unknown Has patient had a PCN reaction causing severe rash involving mucus membranes or skin necrosis: Unknown Has patient had a PCN reaction that required hospitalization: Unknown Has patient had a PCN reaction occurring within the last 10 years: Unknown If all of the above answers are "NO", then may proceed with Cephalosporin use.      Medication List    TAKE these medications   albuterol 108 (90 Base) MCG/ACT inhaler Commonly known as:  PROVENTIL HFA;VENTOLIN HFA Inhale 2 puffs into the lungs every 6 (six) hours as needed for wheezing or shortness of breath.   ARTHRITIS PAIN RELIEF 650 MG CR tablet Generic drug:  acetaminophen Take 1 tablet by mouth every 6 (six) hours as needed.   azithromycin 500 MG tablet Commonly known as:  ZITHROMAX Take 1 tablet (500 mg total)  by mouth daily. For 3 days   cefUROXime 500 MG tablet Commonly known as:  CEFTIN Take 1 tablet (500 mg total) by mouth 2 (two) times daily with a meal for 5 days.   Cholecalciferol 2000 units Tabs Take 2,000 Units by mouth daily.   divalproex 250 MG 24 hr tablet Commonly known as:  DEPAKOTE ER Take 1 tablet (250 mg total) by mouth daily. What changed:    how much to take  when to take this   docusate sodium 100 MG  capsule Commonly known as:  COLACE Take 100 mg by mouth 2 (two) times daily.   ferrous sulfate 325 (65 FE) MG tablet Take 325 mg by mouth daily with breakfast.   fluPHENAZine 5 MG tablet Commonly known as:  PROLIXIN Take 5 mg by mouth 2 (two) times daily.   fluticasone 50 MCG/ACT nasal spray Commonly known as:  FLONASE Place 2 sprays into both nostrils daily.   Fluticasone-Salmeterol 250-50 MCG/DOSE Aepb Commonly known as:  ADVAIR DISKUS Inhale 1 puff into the lungs 2 (two) times daily.   furosemide 20 MG tablet Commonly known as:  LASIX Take 1 tablet (20 mg total) by mouth every other day.   gabapentin 100 MG capsule Commonly known as:  NEURONTIN Take 2 capsules (200 mg total) by mouth 3 (three) times daily.   insulin aspart 100 UNIT/ML injection Commonly known as:  novoLOG Inject 0-9 Units into the skin 3 (three) times daily with meals.   levothyroxine 125 MCG tablet Commonly known as:  SYNTHROID, LEVOTHROID Take 1 tablet (125 mcg total) by mouth daily before breakfast. What changed:  how much to take   loratadine 10 MG tablet Commonly known as:  CLARITIN Take 10 mg by mouth daily.   LORazepam 0.5 MG tablet Commonly known as:  ATIVAN Take 1 tablet (0.5 mg total) by mouth 2 (two) times daily as needed for anxiety.   Melatonin 3 MG Tabs Take 3 mg by mouth at bedtime.   memantine 5 MG tablet Commonly known as:  NAMENDA Take 1 tablet (5 mg total) by mouth 2 (two) times daily.   metoprolol tartrate 25 MG tablet Commonly known as:  LOPRESSOR Take 0.5 tablets (12.5 mg total) by mouth 2 (two) times daily.   nitrofurantoin 100 MG capsule Commonly known as:  MACRODANTIN Take 100 mg by mouth 2 (two) times daily.   OLANZapine 10 MG tablet Commonly known as:  ZYPREXA Take 0.5 tablets (5 mg total) by mouth 2 (two) times daily. What changed:    how much to take  when to take this   ondansetron 8 MG tablet Commonly known as:  ZOFRAN Take 1 tablet (8 mg total)  by mouth every 8 (eight) hours as needed for nausea or vomiting.   potassium chloride 10 MEQ tablet Commonly known as:  K-DUR,KLOR-CON Take 1 tablet (10 mEq total) by mouth every other day.   saccharomyces boulardii 250 MG capsule Commonly known as:  FLORASTOR Take 1 capsule (250 mg total) by mouth 2 (two) times daily.   senna 8.6 MG Tabs tablet Commonly known as:  SENOKOT Take 1 tablet by mouth 2 (two) times a week. Mondays and Fridays   sertraline 50 MG tablet Commonly known as:  ZOLOFT Take 1 tablet (50 mg total) by mouth at bedtime.   sodium chloride 1 g tablet Take 1 g by mouth 3 (three) times daily.        If you experience worsening of your admission symptoms, develop shortness  of breath, life threatening emergency, suicidal or homicidal thoughts you must seek medical attention immediately by calling 911 or calling your MD immediately  if symptoms less severe.  You Must read complete instructions/literature along with all the possible adverse reactions/side effects for all the Medicines you take and that have been prescribed to you. Take any new Medicines after you have completely understood and accept all the possible adverse reactions/side effects.   Please note  You were cared for by a hospitalist during your hospital stay. If you have any questions about your discharge medications or the care you received while you were in the hospital after you are discharged, you can call the unit and asked to speak with the hospitalist on call if the hospitalist that took care of you is not available. Once you are discharged, your primary care physician will handle any further medical issues. Please note that NO REFILLS for any discharge medications will be authorized once you are discharged, as it is imperative that you return to your primary care physician (or establish a relationship with a primary care physician if you do not have one) for your aftercare needs so that they can  reassess your need for medications and monitor your lab values. Today   SUBJECTIVE   Doing well  VITAL SIGNS:  Blood pressure (!) 151/77, pulse 71, temperature 98.2 F (36.8 C), temperature source Oral, resp. rate 17, height 5\' 4"  (1.626 m), weight 59.4 kg (130 lb 14.4 oz), SpO2 99 %.  I/O:    Intake/Output Summary (Last 24 hours) at 04/11/2017 1616 Last data filed at 04/11/2017 0500 Gross per 24 hour  Intake 100 ml  Output 725 ml  Net -625 ml    PHYSICAL EXAMINATION:  GENERAL:  84 y.o.-year-old patient lying in the bed with no acute distress.  EYES: Pupils equal, round, reactive to light and accommodation. No scleral icterus. Extraocular muscles intact.  HEENT: Head atraumatic, normocephalic. Oropharynx and nasopharynx clear.  NECK:  Supple, no jugular venous distention. No thyroid enlargement, no tenderness.  LUNGS: decreased breath sounds bilaterally, no wheezing, rales,rhonchi or crepitation. No use of accessory muscles of respiration.  CARDIOVASCULAR: S1, S2 normal. No murmurs, rubs, or gallops.  ABDOMEN: Soft, non-tender, non-distended. Bowel sounds present. No organomegaly or mass.  EXTREMITIES: No pedal edema, cyanosis, or clubbing.  NEUROLOGIC: Cranial nerves II through XII are intact.no focal weakness PSYCHIATRIC: The patient is alert and oriented x 2.   SKIN: No obvious rash, lesion, or ulcer.   DATA REVIEW:   CBC  Recent Labs  Lab 04/10/17 0528  WBC 3.8  HGB 10.5*  HCT 32.2*  PLT 174    Chemistries  Recent Labs  Lab 04/08/17 1649 04/09/17 0456  NA 137 139  K 3.8 4.5  CL 103 108  CO2 23 22  GLUCOSE 158* 133*  BUN 27* 23*  CREATININE 1.03* 0.86  CALCIUM 8.3* 7.9*  AST 128*  --   ALT 66*  --   ALKPHOS 443*  --   BILITOT 0.7  --     Microbiology Results   Recent Results (from the past 240 hour(s))  Urine culture     Status: None   Collection Time: 04/07/17  1:58 PM  Result Value Ref Range Status   Specimen Description   Final    URINE,  RANDOM Performed at [facility], [address], [place], [place] [address]    Special Requests   Final    NONE Performed at [facility],  [place], [place] [address]    Culture   Final    NO GROWTH Performed at [facility], [place] [address] [place], [place] [address]    Report Status 04/08/2017 FINAL  Final  Blood Culture (routine x 2)     Status: None (Preliminary result)   Collection Time: 04/08/17  4:50 PM  Result Value Ref Range Status   Specimen Description BLOOD BLOOD RIGHT FOREARM  Final   Special Requests   Final    BOTTLES DRAWN AEROBIC AND ANAEROBIC Blood Culture adequate volume   Culture   Final    NO GROWTH 3 DAYS Performed at [facility], [address], [place], [place] [address]    Report Status PENDING  Incomplete  Blood Culture (routine x 2)     Status: None (Preliminary result)   Collection Time: 04/08/17  4:50 PM  Result Value Ref Range Status   Specimen Description BLOOD RIGHT ANTECUBITAL  Final   Special Requests   Final    BOTTLES DRAWN AEROBIC AND ANAEROBIC Blood Culture adequate volume   Culture   Final    NO GROWTH 3 DAYS Performed at [facility], [address] [place], [place] [address]    Report Status PENDING  Incomplete  MRSA PCR Screening     Status: Abnormal   Collection Time: 04/09/17  6:09 AM  Result Value Ref Range Status   MRSA by PCR POSITIVE (A) NEGATIVE Final    Comment:        The GeneXpert MRSA Assay (FDA approved for NASAL specimens only), is one component of a comprehensive MRSA colonization surveillance program. It is not intended to diagnose MRSA infection nor to guide or monitor treatment for MRSA infections. RESULT CALLED TO, READ BACK BY AND VERIFIED WITH: SAMMY TODD 04/09/17 @ [place]  [place] Performed at [facility], [address], [place], [place] [address]     RADIOLOGY:  No results found.   Management plans discussed with the patient,  family and they are in agreement.  CODE STATUS:     Code Status Orders  (From admission, onward)        Start     Ordered   04/08/17 1757  Full code  Continuous     04/08/17 1800    Code Status History    Date Active Date Inactive Code Status Order ID Comments User Context   10/09/2016 16:05 10/14/2016 20:01 Full Code [id number]  Bettey Costa, MD Inpatient   10/02/2016 16:27 10/05/2016 16:53 Full Code [id number]  Fritzi Mandes, MD Inpatient   01/12/2015 22:33 01/15/2015 00:09 Full Code [id number]  Lytle Butte, MD ED   12/30/2014 16:37 01/02/2015 21:00 DNR [id number]  Erick Colace, NP Inpatient   12/30/2014 11:53 12/30/2014 16:34 Full Code [id number]  Janece Canterbury, MD Inpatient    Advance Directive Documentation     Most Recent Value  Type of Advance Directive  Out of facility DNR (pink MOST or yellow form)  Pre-existing out of facility DNR order (yellow form or pink MOST form)  Pink MOST form placed in chart (order not valid for inpatient use)  "MOST" Form in Place?  No data      TOTAL TIME TAKING CARE OF THIS PATIENT: 40 minutes.    Fritzi Mandes M.D on 04/11/2017 at 4:16 PM  Between 7am to 6pm - Pager - [phone] After 6pm go to [email or web address] - Proofreader  [employer]  [phone]  CC: Primary care physician; Kathrine Haddock, NP

## 2017-04-11 NOTE — Clinical Social Work Note (Signed)
Jill Copeland with [facility] called CSW and informed that they received authorization from [facility] and patient can come today. Nurse and MD are aware. Nurse to call patient's daughter to confirm patient is discharging today. Shela Leff MSW,LCSW [phone]

## 2017-04-11 NOTE — Clinical Social Work Note (Signed)
Clinical Social Work Assessment  Patient Details  Name: Jill Copeland MRN: [id number] Date of Birth: 07/09/1931  Date of referral:  04/11/17               Reason for consult:  Discharge Planning                Permission sought to share information with:    Permission granted to share information::     Name::        Agency::     Relationship::     Contact Information:     Housing/Transportation Living arrangements for the past 2 months:  [place] of Information:  Facility, Adult Children Patient Interpreter Needed:  None Criminal Activity/Legal Involvement Pertinent to Current Situation/Hospitalization:  No - Comment as needed Significant Relationships:  Adult Children Lives with:  Facility Resident Do you feel safe going back to the place where you live?    Need for family participation in patient care:  Yes (Comment)  Care giving concerns:  Patient recently placed at [facility].   Social Worker assessment / plan:  MD has stated patient is medically ready for discharge however patient requires re-auth to return to [facility]. [facility] at [facility] has begun this process today. CSW spoke with patient's daughter, Lattie Haw: [phone] and made her aware of the above. She verbalized agreement and understanding.   Employment status:  Retired Nurse, adult PT Recommendations:    Information / Referral to community resources:     Patient/Family's Response to care:  Patient's daughter expressed appreciation for CSW assistance.  Patient/Family's Understanding of and Emotional Response to Diagnosis, Current Treatment, and Prognosis:  Patient with dementia and patient's daughter is in agreement with her return to [facility].  Emotional Assessment Appearance:  Appears stated age Attitude/Demeanor/Rapport:    Affect (typically observed):  Calm Orientation:  Fluctuating Orientation (Suspected and/or reported Sundowners) Alcohol / Substance use:  Not  Applicable Psych involvement (Current and /or in the community):  No (Comment)  Discharge Needs  Concerns to be addressed:  Care Coordination Readmission within the last 30 days:  No Current discharge risk:  None Barriers to Discharge:  No Barriers Identified   Shela Leff, LCSW 04/11/2017, 12:31 PM

## 2017-04-11 NOTE — Discharge Instructions (Signed)
Oxygen 2 liters/min as needed to keep sats >92%

## 2017-04-11 NOTE — Clinical Social Work Note (Signed)
Jill Copeland at [facility] called to say that their social worker thought that arrangements had been made for patient to go to [facility] at discharge as this is what they apparently were told by DSS APS worker: Randi: [phone]. CSW contacted Hilda Blades at [facility] and she stated that they did not accept patient but did receive a call Friday from [facility] stating that they were interested in getting patient to their facility. DSS is not guardian. MD states that patient is ready for discharge today. [facility] is going to begin re-auth with [employer] today.CSW left message for Randi at DSS APS to notify her. Shela Leff MSW,LcSW [phone]

## 2017-04-11 NOTE — Discharge Planning (Signed)
Report called to RN at [facility]. Pt's daughter called to notify her of transfer back to facility. Pt ready for discharge to SNF.

## 2017-04-11 NOTE — Care Management Important Message (Signed)
Important Message  Patient Details  Name: Jill Copeland MRN: [id number] Date of Birth: 03-29-1931   Medicare Important Message Given:  Yes    Beverly Sessions, RN 04/11/2017, 3:19 PM

## 2017-04-13 LAB — CULTURE, BLOOD (ROUTINE X 2)
Culture: NO GROWTH
Culture: NO GROWTH
SPECIAL REQUESTS: ADEQUATE
Special Requests: ADEQUATE

## 2017-04-14 DIAGNOSIS — J181 Lobar pneumonia, unspecified organism: Secondary | ICD-10-CM | POA: Diagnosis not present

## 2017-04-14 DIAGNOSIS — E039 Hypothyroidism, unspecified: Secondary | ICD-10-CM | POA: Diagnosis not present

## 2017-04-14 DIAGNOSIS — N390 Urinary tract infection, site not specified: Secondary | ICD-10-CM | POA: Diagnosis not present

## 2017-04-14 DIAGNOSIS — G4700 Insomnia, unspecified: Secondary | ICD-10-CM | POA: Diagnosis not present

## 2017-04-26 ENCOUNTER — Encounter: Payer: Medicare Other | Admitting: Internal Medicine

## 2017-04-26 DIAGNOSIS — J181 Lobar pneumonia, unspecified organism: Secondary | ICD-10-CM | POA: Diagnosis not present

## 2017-04-26 DIAGNOSIS — I10 Essential (primary) hypertension: Secondary | ICD-10-CM | POA: Diagnosis not present

## 2017-04-26 DIAGNOSIS — E039 Hypothyroidism, unspecified: Secondary | ICD-10-CM | POA: Diagnosis not present

## 2017-04-27 ENCOUNTER — Encounter: Payer: Self-pay | Admitting: Internal Medicine

## 2017-05-04 DIAGNOSIS — G4700 Insomnia, unspecified: Secondary | ICD-10-CM | POA: Diagnosis not present

## 2017-05-15 DIAGNOSIS — I10 Essential (primary) hypertension: Secondary | ICD-10-CM | POA: Diagnosis not present

## 2017-05-15 DIAGNOSIS — J181 Lobar pneumonia, unspecified organism: Secondary | ICD-10-CM | POA: Diagnosis not present

## 2017-05-15 DIAGNOSIS — E039 Hypothyroidism, unspecified: Secondary | ICD-10-CM | POA: Diagnosis not present

## 2017-05-17 DIAGNOSIS — G4700 Insomnia, unspecified: Secondary | ICD-10-CM | POA: Diagnosis not present

## 2017-05-19 IMAGING — CT CT CHEST W/ CM
1 of 2 series · 14 of 32 positions shown, 18 images · IV contrast (omnipaque)
Comparison: 12/30/2014

CLINICAL DATA: Dementia, increased confusion. Hypoxia. Cough. Lung
nodule. Recent pneumonia on cervical spine CT.

EXAM:
CT CHEST WITH CONTRAST
TECHNIQUE: Multidetector CT imaging of the chest was performed during
intravenous contrast administration.
CONTRAST:  75mL OMNIPAQUE IOHEXOL 300 MG/ML  SOLN

[Series 2: rtn chest with st · axial · 0.66mm/px · z∈[-274,-24]mm · 14 of 56 slices shown, 18 images]
[im 3/56  mediastinal]
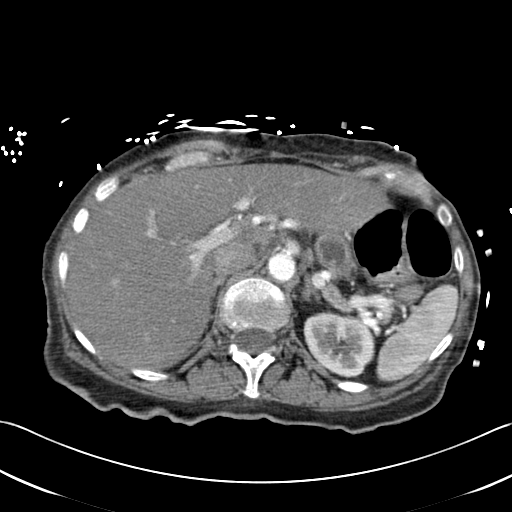
[im 3/56  lung]
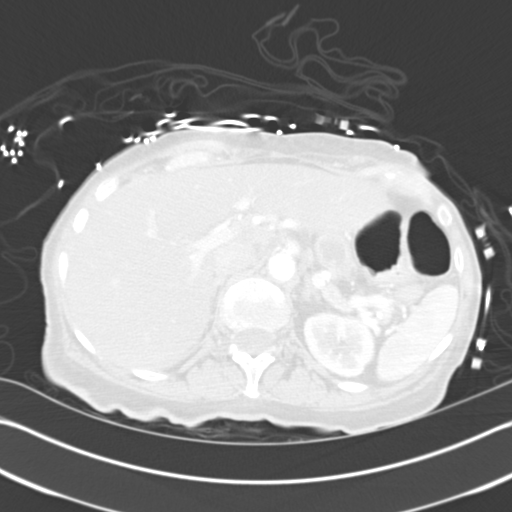
[im 8/56  lung]
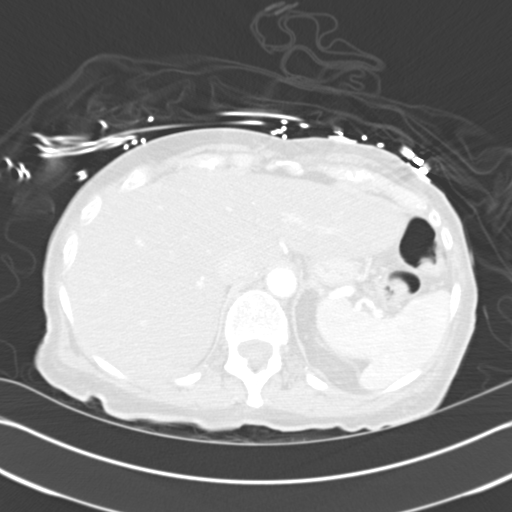
[im 12/56  lung]
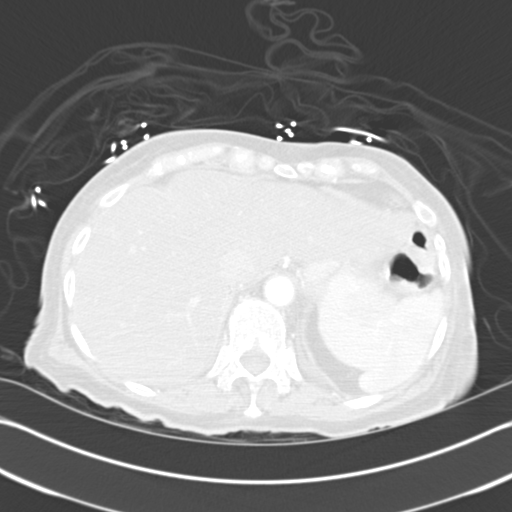
[im 15/56  lung]
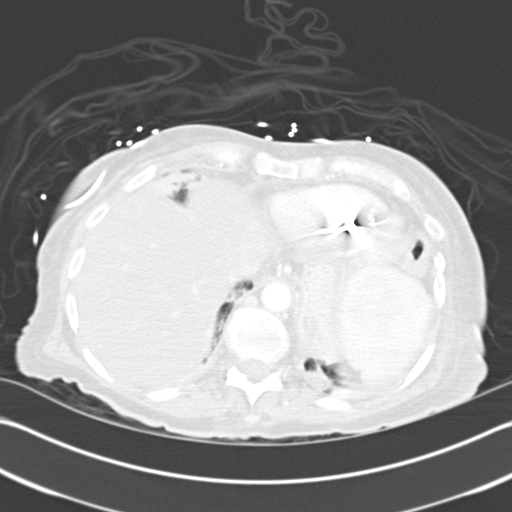
[im 20/56  mediastinal]
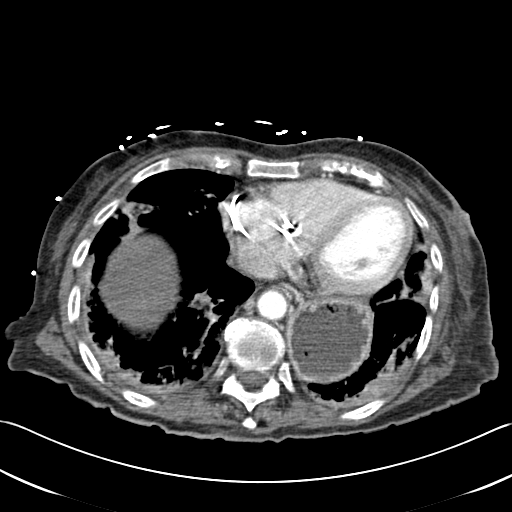
[im 20/56  lung]
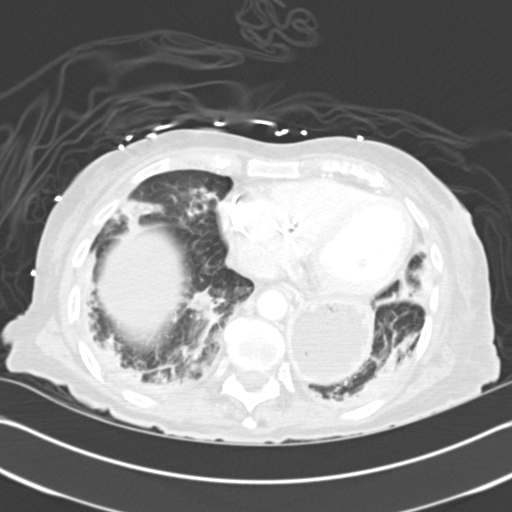
[im 23/56  lung]
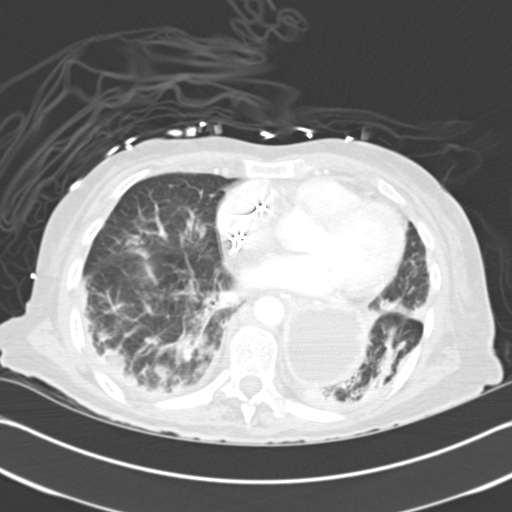
[im 27/56  lung]
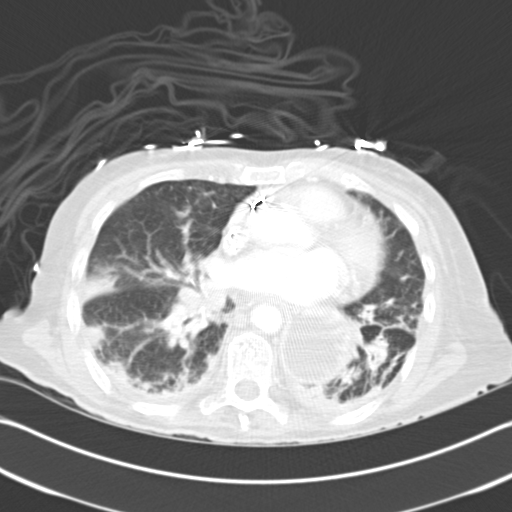
[im 28/56  lung]
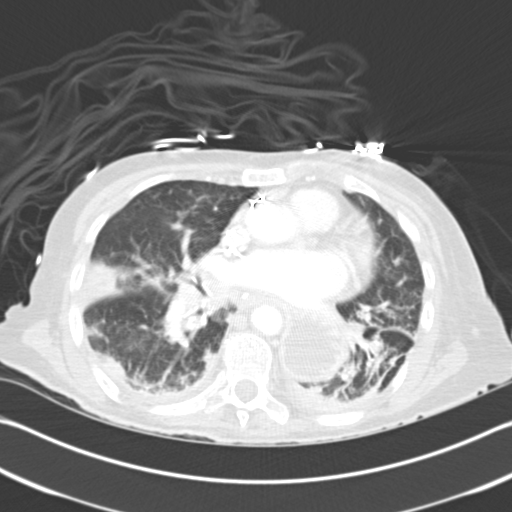
[im 32/56  mediastinal]
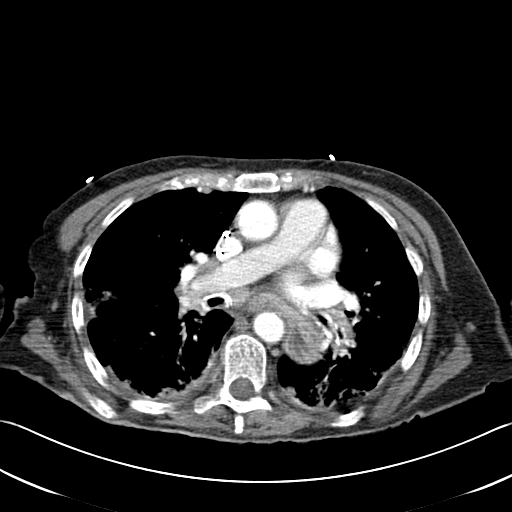
[im 32/56  lung]
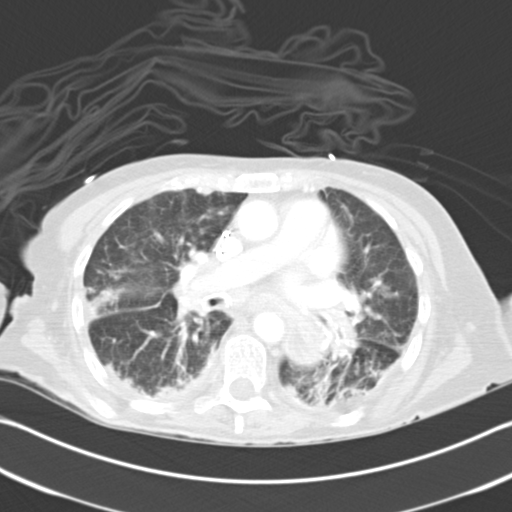
[im 36/56  lung]
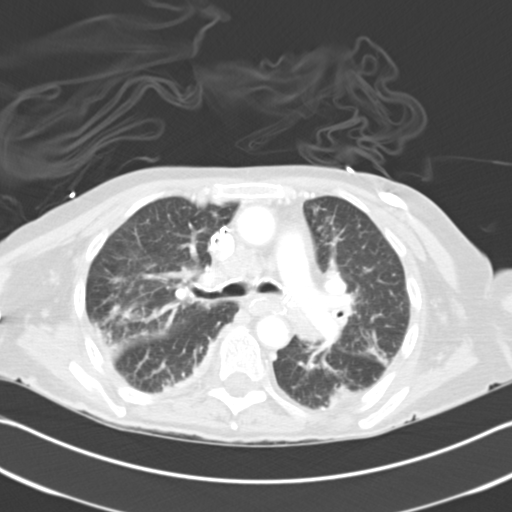
[im 41/56  lung]
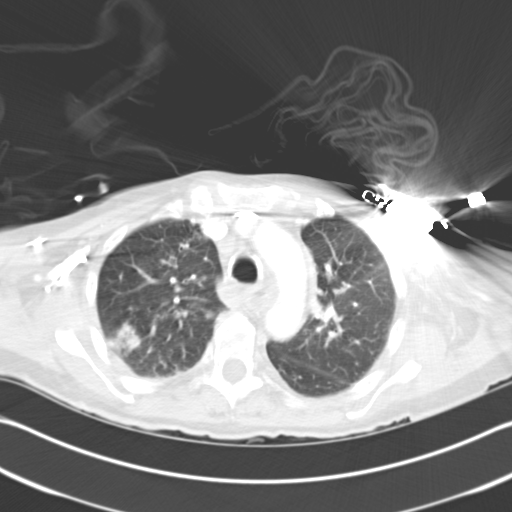
[im 44/56  lung]
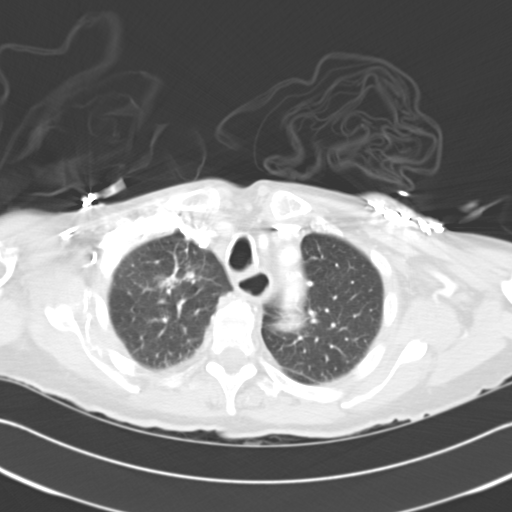
[im 48/56  mediastinal]
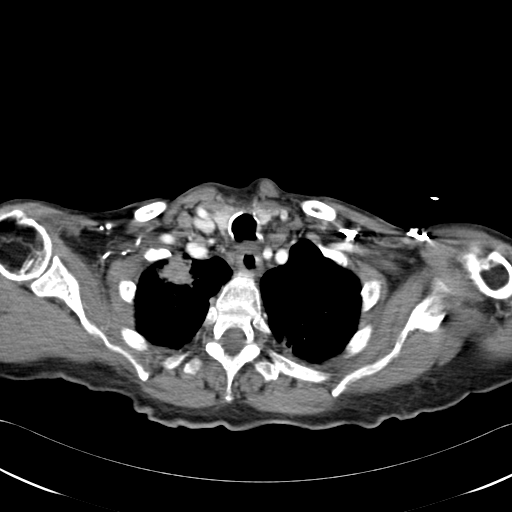
[im 48/56  lung]
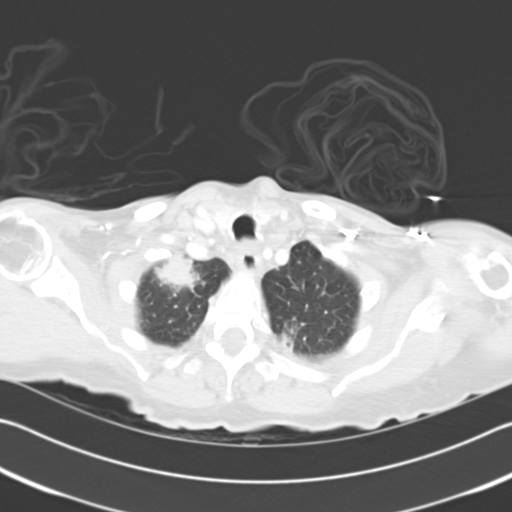
[im 53/56  lung]
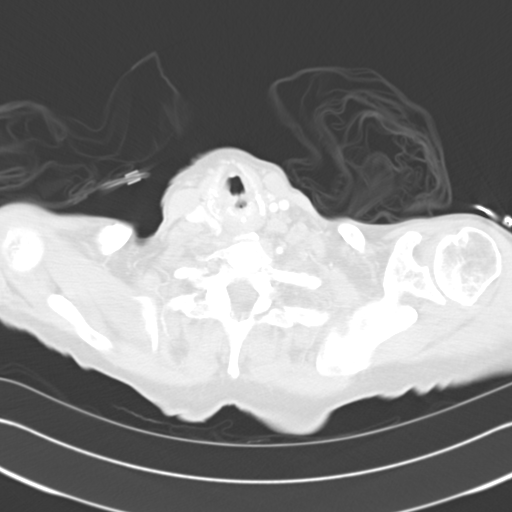

[14 of 32 positions shown; findings below may reference images not displayed]

FINDINGS: Mediastinum/Nodes: Bilateral supraclavicular adenopathy, index
left-sided node 1 cm in short axis, image 8 series 2. Prevascular,
paratracheal, bilateral hilar, and right infrahilar adenopathy,
right lower paratracheal lymph node short axis diameter 1.8 cm.

Atherosclerotic aortic arch.

Moderate-sized hiatal hernia eccentric to the left. Possible
anastomotic staple line above this in the mid esophagus, correlate
with operative history.

Pacer leads in place.

I do not see any large central pulmonary emboli, but today's exam
was not performed as a CT angiogram and is not considered sensitive
for more peripheral pulmonary emboli.

Lungs/Pleura: Scattered densities are present in both lungs. Some of
these are irregular in nodule, with marginal airspace opacities and
dense central opacity, and especially along the periphery of the
lung, to include a 2.5 by 2.3 cm nodular opacity in the apical
segment right upper lobe ; a 1.9 by 1.4 cm opacity posteriorly in
the right upper lobe (image 17, series 6). ; peripheral wedge-shaped
opacities in the right middle lobe and right lower lobe ; and
several smaller left lung peripheral airspace opacities. Scattered
bandlike opacities in both lungs suggesting atelectasis.

Thinning of the left mainstem bronchus noted on image 22 series 6,
with suspected plugging in the right lower lobe tracheobronchial
tree on images 28-32 of series 6. Left lower lobe airway plugging on
image 44 series 602.

Upper abdomen: Hepatic steatosis.

Musculoskeletal: Mild thoracic kyphosis. Mild thoracic spondylosis.
Thinning of the left seventh rib posterolaterally.
IMPRESSION: 1. Considerable mediastinal, hilar, and infrahilar adenopathy could
be reactive or malignant.
2. There scattered densities in both lungs, some of which are
nodular, but with the defining characteristic being more peripheral
and wedge-shaped. Certainly pulmonary emboli can cause
hemorrhage/pulmonary infarcts in such a pattern, but I do not see
any large central pulmonary emboli (exam not sensitive for smaller
pulmonary emboli-not performed as a pulmonary angiogram). Other
possibilities include multilobar pneumonia, inflammatory nodules, or
underlying malignancy. Follow-up to clearance is recommended.
3. Airway plugging in the right lower lobe and left lower lobe.
4. Moderate-sized hiatal hernia eccentric to the left.
5. Hepatic steatosis.

## 2017-05-31 DIAGNOSIS — I10 Essential (primary) hypertension: Secondary | ICD-10-CM | POA: Diagnosis not present

## 2017-05-31 DIAGNOSIS — J181 Lobar pneumonia, unspecified organism: Secondary | ICD-10-CM | POA: Diagnosis not present

## 2017-05-31 DIAGNOSIS — E039 Hypothyroidism, unspecified: Secondary | ICD-10-CM | POA: Diagnosis not present

## 2017-06-01 DIAGNOSIS — G4700 Insomnia, unspecified: Secondary | ICD-10-CM | POA: Diagnosis not present

## 2017-06-08 DIAGNOSIS — R2689 Other abnormalities of gait and mobility: Secondary | ICD-10-CM | POA: Diagnosis not present

## 2017-06-08 DIAGNOSIS — M6281 Muscle weakness (generalized): Secondary | ICD-10-CM | POA: Diagnosis not present

## 2017-06-29 DIAGNOSIS — E039 Hypothyroidism, unspecified: Secondary | ICD-10-CM | POA: Diagnosis not present

## 2017-06-29 DIAGNOSIS — I10 Essential (primary) hypertension: Secondary | ICD-10-CM | POA: Diagnosis not present

## 2017-06-29 DIAGNOSIS — K5900 Constipation, unspecified: Secondary | ICD-10-CM | POA: Diagnosis not present

## 2017-06-29 DIAGNOSIS — G4700 Insomnia, unspecified: Secondary | ICD-10-CM | POA: Diagnosis not present

## 2017-06-30 DIAGNOSIS — E559 Vitamin D deficiency, unspecified: Secondary | ICD-10-CM | POA: Diagnosis not present

## 2017-06-30 DIAGNOSIS — E039 Hypothyroidism, unspecified: Secondary | ICD-10-CM | POA: Diagnosis not present

## 2017-06-30 DIAGNOSIS — E119 Type 2 diabetes mellitus without complications: Secondary | ICD-10-CM | POA: Diagnosis not present

## 2017-07-08 DIAGNOSIS — R2689 Other abnormalities of gait and mobility: Secondary | ICD-10-CM | POA: Diagnosis not present

## 2017-07-08 DIAGNOSIS — A419 Sepsis, unspecified organism: Secondary | ICD-10-CM | POA: Diagnosis not present

## 2017-07-08 DIAGNOSIS — R2681 Unsteadiness on feet: Secondary | ICD-10-CM | POA: Diagnosis not present

## 2017-07-08 DIAGNOSIS — R1312 Dysphagia, oropharyngeal phase: Secondary | ICD-10-CM | POA: Diagnosis not present

## 2017-07-27 DIAGNOSIS — E1143 Type 2 diabetes mellitus with diabetic autonomic (poly)neuropathy: Secondary | ICD-10-CM | POA: Diagnosis not present

## 2017-07-27 DIAGNOSIS — G4700 Insomnia, unspecified: Secondary | ICD-10-CM | POA: Diagnosis not present

## 2017-08-01 DIAGNOSIS — D649 Anemia, unspecified: Secondary | ICD-10-CM | POA: Diagnosis not present

## 2017-08-01 DIAGNOSIS — E119 Type 2 diabetes mellitus without complications: Secondary | ICD-10-CM | POA: Diagnosis not present

## 2017-08-01 DIAGNOSIS — I10 Essential (primary) hypertension: Secondary | ICD-10-CM | POA: Diagnosis not present

## 2017-08-01 DIAGNOSIS — E039 Hypothyroidism, unspecified: Secondary | ICD-10-CM | POA: Diagnosis not present

## 2017-08-04 DIAGNOSIS — E109 Type 1 diabetes mellitus without complications: Secondary | ICD-10-CM | POA: Diagnosis not present

## 2017-08-05 DIAGNOSIS — L89310 Pressure ulcer of right buttock, unstageable: Secondary | ICD-10-CM | POA: Diagnosis not present

## 2017-08-10 DIAGNOSIS — E1143 Type 2 diabetes mellitus with diabetic autonomic (poly)neuropathy: Secondary | ICD-10-CM | POA: Diagnosis not present

## 2017-08-10 DIAGNOSIS — G4700 Insomnia, unspecified: Secondary | ICD-10-CM | POA: Diagnosis not present

## 2017-08-12 DIAGNOSIS — L89310 Pressure ulcer of right buttock, unstageable: Secondary | ICD-10-CM | POA: Diagnosis not present

## 2017-08-19 DIAGNOSIS — L98419 Non-pressure chronic ulcer of buttock with unspecified severity: Secondary | ICD-10-CM | POA: Diagnosis not present

## 2017-08-26 DIAGNOSIS — L98419 Non-pressure chronic ulcer of buttock with unspecified severity: Secondary | ICD-10-CM | POA: Diagnosis not present

## 2017-08-30 ENCOUNTER — Ambulatory Visit: Payer: Medicare Other | Admitting: Unknown Physician Specialty

## 2017-09-02 DIAGNOSIS — L853 Xerosis cutis: Secondary | ICD-10-CM | POA: Diagnosis not present

## 2017-09-09 DIAGNOSIS — E1143 Type 2 diabetes mellitus with diabetic autonomic (poly)neuropathy: Secondary | ICD-10-CM | POA: Diagnosis not present

## 2017-09-09 DIAGNOSIS — G4700 Insomnia, unspecified: Secondary | ICD-10-CM | POA: Diagnosis not present

## 2017-09-13 DIAGNOSIS — M6281 Muscle weakness (generalized): Secondary | ICD-10-CM | POA: Diagnosis not present

## 2017-09-13 DIAGNOSIS — I10 Essential (primary) hypertension: Secondary | ICD-10-CM | POA: Diagnosis not present

## 2017-09-13 DIAGNOSIS — R2689 Other abnormalities of gait and mobility: Secondary | ICD-10-CM | POA: Diagnosis not present

## 2017-09-14 DIAGNOSIS — M6281 Muscle weakness (generalized): Secondary | ICD-10-CM | POA: Diagnosis not present

## 2017-09-14 DIAGNOSIS — R2689 Other abnormalities of gait and mobility: Secondary | ICD-10-CM | POA: Diagnosis not present

## 2017-09-14 DIAGNOSIS — I10 Essential (primary) hypertension: Secondary | ICD-10-CM | POA: Diagnosis not present

## 2017-09-15 DIAGNOSIS — R2689 Other abnormalities of gait and mobility: Secondary | ICD-10-CM | POA: Diagnosis not present

## 2017-09-15 DIAGNOSIS — M6281 Muscle weakness (generalized): Secondary | ICD-10-CM | POA: Diagnosis not present

## 2017-09-15 DIAGNOSIS — I10 Essential (primary) hypertension: Secondary | ICD-10-CM | POA: Diagnosis not present

## 2017-09-16 DIAGNOSIS — R2689 Other abnormalities of gait and mobility: Secondary | ICD-10-CM | POA: Diagnosis not present

## 2017-09-16 DIAGNOSIS — M6281 Muscle weakness (generalized): Secondary | ICD-10-CM | POA: Diagnosis not present

## 2017-09-16 DIAGNOSIS — I10 Essential (primary) hypertension: Secondary | ICD-10-CM | POA: Diagnosis not present

## 2017-09-19 DIAGNOSIS — R2681 Unsteadiness on feet: Secondary | ICD-10-CM | POA: Diagnosis not present

## 2017-09-19 DIAGNOSIS — R2689 Other abnormalities of gait and mobility: Secondary | ICD-10-CM | POA: Diagnosis not present

## 2017-09-20 DIAGNOSIS — R2681 Unsteadiness on feet: Secondary | ICD-10-CM | POA: Diagnosis not present

## 2017-09-20 DIAGNOSIS — R2689 Other abnormalities of gait and mobility: Secondary | ICD-10-CM | POA: Diagnosis not present

## 2017-09-21 DIAGNOSIS — R2681 Unsteadiness on feet: Secondary | ICD-10-CM | POA: Diagnosis not present

## 2017-09-21 DIAGNOSIS — R2689 Other abnormalities of gait and mobility: Secondary | ICD-10-CM | POA: Diagnosis not present

## 2017-09-22 DIAGNOSIS — R2689 Other abnormalities of gait and mobility: Secondary | ICD-10-CM | POA: Diagnosis not present

## 2017-09-22 DIAGNOSIS — R2681 Unsteadiness on feet: Secondary | ICD-10-CM | POA: Diagnosis not present

## 2017-09-23 DIAGNOSIS — R2681 Unsteadiness on feet: Secondary | ICD-10-CM | POA: Diagnosis not present

## 2017-09-23 DIAGNOSIS — R2689 Other abnormalities of gait and mobility: Secondary | ICD-10-CM | POA: Diagnosis not present

## 2017-09-26 DIAGNOSIS — R2681 Unsteadiness on feet: Secondary | ICD-10-CM | POA: Diagnosis not present

## 2017-09-26 DIAGNOSIS — R2689 Other abnormalities of gait and mobility: Secondary | ICD-10-CM | POA: Diagnosis not present

## 2017-09-28 DIAGNOSIS — E1143 Type 2 diabetes mellitus with diabetic autonomic (poly)neuropathy: Secondary | ICD-10-CM | POA: Diagnosis not present

## 2017-09-28 DIAGNOSIS — G4700 Insomnia, unspecified: Secondary | ICD-10-CM | POA: Diagnosis not present

## 2017-10-04 DIAGNOSIS — K5900 Constipation, unspecified: Secondary | ICD-10-CM | POA: Diagnosis not present

## 2017-10-04 DIAGNOSIS — I10 Essential (primary) hypertension: Secondary | ICD-10-CM | POA: Diagnosis not present

## 2017-10-04 DIAGNOSIS — E039 Hypothyroidism, unspecified: Secondary | ICD-10-CM | POA: Diagnosis not present

## 2017-10-05 DIAGNOSIS — G4700 Insomnia, unspecified: Secondary | ICD-10-CM | POA: Diagnosis not present

## 2017-10-05 DIAGNOSIS — E1143 Type 2 diabetes mellitus with diabetic autonomic (poly)neuropathy: Secondary | ICD-10-CM | POA: Diagnosis not present

## 2017-10-26 DIAGNOSIS — E1143 Type 2 diabetes mellitus with diabetic autonomic (poly)neuropathy: Secondary | ICD-10-CM | POA: Diagnosis not present

## 2017-10-26 DIAGNOSIS — G4700 Insomnia, unspecified: Secondary | ICD-10-CM | POA: Diagnosis not present

## 2017-11-09 DIAGNOSIS — E1143 Type 2 diabetes mellitus with diabetic autonomic (poly)neuropathy: Secondary | ICD-10-CM | POA: Diagnosis not present

## 2017-11-09 DIAGNOSIS — G4700 Insomnia, unspecified: Secondary | ICD-10-CM | POA: Diagnosis not present

## 2017-11-11 DIAGNOSIS — L98412 Non-pressure chronic ulcer of buttock with fat layer exposed: Secondary | ICD-10-CM | POA: Diagnosis not present

## 2017-11-18 DIAGNOSIS — L98412 Non-pressure chronic ulcer of buttock with fat layer exposed: Secondary | ICD-10-CM | POA: Diagnosis not present

## 2017-11-23 DIAGNOSIS — G4700 Insomnia, unspecified: Secondary | ICD-10-CM | POA: Diagnosis not present

## 2017-11-23 DIAGNOSIS — E1143 Type 2 diabetes mellitus with diabetic autonomic (poly)neuropathy: Secondary | ICD-10-CM | POA: Diagnosis not present

## 2017-11-25 DIAGNOSIS — R2689 Other abnormalities of gait and mobility: Secondary | ICD-10-CM | POA: Diagnosis not present

## 2017-11-25 DIAGNOSIS — L98412 Non-pressure chronic ulcer of buttock with fat layer exposed: Secondary | ICD-10-CM | POA: Diagnosis not present

## 2017-11-25 DIAGNOSIS — M6281 Muscle weakness (generalized): Secondary | ICD-10-CM | POA: Diagnosis not present

## 2017-11-25 DIAGNOSIS — R1312 Dysphagia, oropharyngeal phase: Secondary | ICD-10-CM | POA: Diagnosis not present

## 2017-11-28 DIAGNOSIS — R2689 Other abnormalities of gait and mobility: Secondary | ICD-10-CM | POA: Diagnosis not present

## 2017-11-28 DIAGNOSIS — M6281 Muscle weakness (generalized): Secondary | ICD-10-CM | POA: Diagnosis not present

## 2017-11-28 DIAGNOSIS — R1312 Dysphagia, oropharyngeal phase: Secondary | ICD-10-CM | POA: Diagnosis not present

## 2017-11-29 DIAGNOSIS — R2689 Other abnormalities of gait and mobility: Secondary | ICD-10-CM | POA: Diagnosis not present

## 2017-11-29 DIAGNOSIS — R1312 Dysphagia, oropharyngeal phase: Secondary | ICD-10-CM | POA: Diagnosis not present

## 2017-11-29 DIAGNOSIS — M6281 Muscle weakness (generalized): Secondary | ICD-10-CM | POA: Diagnosis not present

## 2017-11-29 DIAGNOSIS — E559 Vitamin D deficiency, unspecified: Secondary | ICD-10-CM | POA: Diagnosis not present

## 2017-11-29 DIAGNOSIS — E109 Type 1 diabetes mellitus without complications: Secondary | ICD-10-CM | POA: Diagnosis not present

## 2017-11-29 DIAGNOSIS — R531 Weakness: Secondary | ICD-10-CM | POA: Diagnosis not present

## 2017-11-29 DIAGNOSIS — I509 Heart failure, unspecified: Secondary | ICD-10-CM | POA: Diagnosis not present

## 2017-11-30 DIAGNOSIS — R2689 Other abnormalities of gait and mobility: Secondary | ICD-10-CM | POA: Diagnosis not present

## 2017-11-30 DIAGNOSIS — R1312 Dysphagia, oropharyngeal phase: Secondary | ICD-10-CM | POA: Diagnosis not present

## 2017-11-30 DIAGNOSIS — M6281 Muscle weakness (generalized): Secondary | ICD-10-CM | POA: Diagnosis not present

## 2017-12-01 DIAGNOSIS — R2689 Other abnormalities of gait and mobility: Secondary | ICD-10-CM | POA: Diagnosis not present

## 2017-12-01 DIAGNOSIS — M6281 Muscle weakness (generalized): Secondary | ICD-10-CM | POA: Diagnosis not present

## 2017-12-01 DIAGNOSIS — R1312 Dysphagia, oropharyngeal phase: Secondary | ICD-10-CM | POA: Diagnosis not present

## 2017-12-02 DIAGNOSIS — M6281 Muscle weakness (generalized): Secondary | ICD-10-CM | POA: Diagnosis not present

## 2017-12-02 DIAGNOSIS — R1312 Dysphagia, oropharyngeal phase: Secondary | ICD-10-CM | POA: Diagnosis not present

## 2017-12-02 DIAGNOSIS — R2689 Other abnormalities of gait and mobility: Secondary | ICD-10-CM | POA: Diagnosis not present

## 2017-12-04 DIAGNOSIS — R1312 Dysphagia, oropharyngeal phase: Secondary | ICD-10-CM | POA: Diagnosis not present

## 2017-12-04 DIAGNOSIS — M6281 Muscle weakness (generalized): Secondary | ICD-10-CM | POA: Diagnosis not present

## 2017-12-04 DIAGNOSIS — R2689 Other abnormalities of gait and mobility: Secondary | ICD-10-CM | POA: Diagnosis not present

## 2017-12-05 DIAGNOSIS — K5900 Constipation, unspecified: Secondary | ICD-10-CM | POA: Diagnosis not present

## 2017-12-05 DIAGNOSIS — R1312 Dysphagia, oropharyngeal phase: Secondary | ICD-10-CM | POA: Diagnosis not present

## 2017-12-05 DIAGNOSIS — I10 Essential (primary) hypertension: Secondary | ICD-10-CM | POA: Diagnosis not present

## 2017-12-05 DIAGNOSIS — M6281 Muscle weakness (generalized): Secondary | ICD-10-CM | POA: Diagnosis not present

## 2017-12-05 DIAGNOSIS — R2689 Other abnormalities of gait and mobility: Secondary | ICD-10-CM | POA: Diagnosis not present

## 2017-12-05 DIAGNOSIS — E039 Hypothyroidism, unspecified: Secondary | ICD-10-CM | POA: Diagnosis not present

## 2017-12-08 DIAGNOSIS — R1312 Dysphagia, oropharyngeal phase: Secondary | ICD-10-CM | POA: Diagnosis not present

## 2017-12-08 DIAGNOSIS — R2689 Other abnormalities of gait and mobility: Secondary | ICD-10-CM | POA: Diagnosis not present

## 2017-12-08 DIAGNOSIS — M6281 Muscle weakness (generalized): Secondary | ICD-10-CM | POA: Diagnosis not present

## 2017-12-09 DIAGNOSIS — L98412 Non-pressure chronic ulcer of buttock with fat layer exposed: Secondary | ICD-10-CM | POA: Diagnosis not present

## 2017-12-16 DIAGNOSIS — L98412 Non-pressure chronic ulcer of buttock with fat layer exposed: Secondary | ICD-10-CM | POA: Diagnosis not present

## 2017-12-23 DIAGNOSIS — L98419 Non-pressure chronic ulcer of buttock with unspecified severity: Secondary | ICD-10-CM | POA: Diagnosis not present

## 2017-12-28 DIAGNOSIS — E1143 Type 2 diabetes mellitus with diabetic autonomic (poly)neuropathy: Secondary | ICD-10-CM | POA: Diagnosis not present

## 2017-12-28 DIAGNOSIS — G4700 Insomnia, unspecified: Secondary | ICD-10-CM | POA: Diagnosis not present

## 2017-12-30 DIAGNOSIS — L98412 Non-pressure chronic ulcer of buttock with fat layer exposed: Secondary | ICD-10-CM | POA: Diagnosis not present

## 2018-01-06 DIAGNOSIS — L98412 Non-pressure chronic ulcer of buttock with fat layer exposed: Secondary | ICD-10-CM | POA: Diagnosis not present

## 2018-01-13 DIAGNOSIS — L853 Xerosis cutis: Secondary | ICD-10-CM | POA: Diagnosis not present

## 2018-01-20 DIAGNOSIS — L603 Nail dystrophy: Secondary | ICD-10-CM | POA: Diagnosis not present

## 2018-01-20 DIAGNOSIS — B351 Tinea unguium: Secondary | ICD-10-CM | POA: Diagnosis not present

## 2018-01-20 DIAGNOSIS — L97921 Non-pressure chronic ulcer of unspecified part of left lower leg limited to breakdown of skin: Secondary | ICD-10-CM | POA: Diagnosis not present

## 2018-01-20 DIAGNOSIS — I739 Peripheral vascular disease, unspecified: Secondary | ICD-10-CM | POA: Diagnosis not present

## 2018-01-24 DIAGNOSIS — E038 Other specified hypothyroidism: Secondary | ICD-10-CM | POA: Diagnosis not present

## 2018-01-24 DIAGNOSIS — E039 Hypothyroidism, unspecified: Secondary | ICD-10-CM | POA: Diagnosis not present

## 2018-02-01 DIAGNOSIS — G4700 Insomnia, unspecified: Secondary | ICD-10-CM | POA: Diagnosis not present

## 2018-02-01 DIAGNOSIS — E1143 Type 2 diabetes mellitus with diabetic autonomic (poly)neuropathy: Secondary | ICD-10-CM | POA: Diagnosis not present

## 2018-02-07 DIAGNOSIS — I10 Essential (primary) hypertension: Secondary | ICD-10-CM | POA: Diagnosis not present

## 2018-02-07 DIAGNOSIS — E039 Hypothyroidism, unspecified: Secondary | ICD-10-CM | POA: Diagnosis not present

## 2018-02-07 DIAGNOSIS — K5900 Constipation, unspecified: Secondary | ICD-10-CM | POA: Diagnosis not present

## 2018-03-08 DIAGNOSIS — G4700 Insomnia, unspecified: Secondary | ICD-10-CM | POA: Diagnosis not present

## 2018-03-08 DIAGNOSIS — E1143 Type 2 diabetes mellitus with diabetic autonomic (poly)neuropathy: Secondary | ICD-10-CM | POA: Diagnosis not present

## 2018-03-09 ENCOUNTER — Other Ambulatory Visit: Payer: Self-pay

## 2018-03-09 ENCOUNTER — Emergency Department: Payer: Medicare Other

## 2018-03-09 ENCOUNTER — Inpatient Hospital Stay
Admission: EM | Admit: 2018-03-09 | Discharge: 2018-03-17 | DRG: 193 | Disposition: A | Discharge status: Skilled Nursing Facility | Payer: Medicare Other | Attending: Internal Medicine | Admitting: Internal Medicine

## 2018-03-09 DIAGNOSIS — R05 Cough: Secondary | ICD-10-CM

## 2018-03-09 DIAGNOSIS — E785 Hyperlipidemia, unspecified: Secondary | ICD-10-CM | POA: Diagnosis not present

## 2018-03-09 DIAGNOSIS — Z8673 Personal history of transient ischemic attack (TIA), and cerebral infarction without residual deficits: Secondary | ICD-10-CM

## 2018-03-09 DIAGNOSIS — M1990 Unspecified osteoarthritis, unspecified site: Secondary | ICD-10-CM | POA: Diagnosis not present

## 2018-03-09 DIAGNOSIS — R609 Edema, unspecified: Secondary | ICD-10-CM

## 2018-03-09 DIAGNOSIS — E871 Hypo-osmolality and hyponatremia: Secondary | ICD-10-CM | POA: Diagnosis not present

## 2018-03-09 DIAGNOSIS — L8990 Pressure ulcer of unspecified site, unspecified stage: Secondary | ICD-10-CM

## 2018-03-09 DIAGNOSIS — I10 Essential (primary) hypertension: Secondary | ICD-10-CM | POA: Diagnosis not present

## 2018-03-09 DIAGNOSIS — R059 Cough, unspecified: Secondary | ICD-10-CM

## 2018-03-09 DIAGNOSIS — Z681 Body mass index (BMI) 19 or less, adult: Secondary | ICD-10-CM | POA: Diagnosis not present

## 2018-03-09 DIAGNOSIS — E038 Other specified hypothyroidism: Secondary | ICD-10-CM | POA: Diagnosis not present

## 2018-03-09 DIAGNOSIS — J811 Chronic pulmonary edema: Secondary | ICD-10-CM | POA: Diagnosis present

## 2018-03-09 DIAGNOSIS — K219 Gastro-esophageal reflux disease without esophagitis: Secondary | ICD-10-CM | POA: Diagnosis not present

## 2018-03-09 DIAGNOSIS — Z515 Encounter for palliative care: Secondary | ICD-10-CM | POA: Diagnosis not present

## 2018-03-09 DIAGNOSIS — E860 Dehydration: Secondary | ICD-10-CM | POA: Diagnosis present

## 2018-03-09 DIAGNOSIS — M7989 Other specified soft tissue disorders: Secondary | ICD-10-CM | POA: Diagnosis not present

## 2018-03-09 DIAGNOSIS — R0602 Shortness of breath: Secondary | ICD-10-CM | POA: Diagnosis present

## 2018-03-09 DIAGNOSIS — R000 Tachycardia, unspecified: Secondary | ICD-10-CM | POA: Diagnosis present

## 2018-03-09 DIAGNOSIS — J90 Pleural effusion, not elsewhere classified: Secondary | ICD-10-CM | POA: Diagnosis present

## 2018-03-09 DIAGNOSIS — R1310 Dysphagia, unspecified: Secondary | ICD-10-CM | POA: Diagnosis not present

## 2018-03-09 DIAGNOSIS — K222 Esophageal obstruction: Secondary | ICD-10-CM | POA: Diagnosis present

## 2018-03-09 DIAGNOSIS — M2550 Pain in unspecified joint: Secondary | ICD-10-CM | POA: Diagnosis not present

## 2018-03-09 DIAGNOSIS — F259 Schizoaffective disorder, unspecified: Secondary | ICD-10-CM | POA: Diagnosis present

## 2018-03-09 DIAGNOSIS — Z934 Other artificial openings of gastrointestinal tract status: Secondary | ICD-10-CM

## 2018-03-09 DIAGNOSIS — E039 Hypothyroidism, unspecified: Secondary | ICD-10-CM | POA: Diagnosis present

## 2018-03-09 DIAGNOSIS — R404 Transient alteration of awareness: Secondary | ICD-10-CM | POA: Diagnosis not present

## 2018-03-09 DIAGNOSIS — E119 Type 2 diabetes mellitus without complications: Secondary | ICD-10-CM | POA: Diagnosis present

## 2018-03-09 DIAGNOSIS — Z7401 Bed confinement status: Secondary | ICD-10-CM | POA: Diagnosis not present

## 2018-03-09 DIAGNOSIS — F0281 Dementia in other diseases classified elsewhere with behavioral disturbance: Secondary | ICD-10-CM | POA: Diagnosis present

## 2018-03-09 DIAGNOSIS — Z950 Presence of cardiac pacemaker: Secondary | ICD-10-CM

## 2018-03-09 DIAGNOSIS — E870 Hyperosmolality and hypernatremia: Secondary | ICD-10-CM | POA: Diagnosis present

## 2018-03-09 DIAGNOSIS — E46 Unspecified protein-calorie malnutrition: Secondary | ICD-10-CM | POA: Diagnosis not present

## 2018-03-09 DIAGNOSIS — G309 Alzheimer's disease, unspecified: Secondary | ICD-10-CM | POA: Diagnosis not present

## 2018-03-09 DIAGNOSIS — Z87440 Personal history of urinary (tract) infections: Secondary | ICD-10-CM

## 2018-03-09 DIAGNOSIS — J9600 Acute respiratory failure, unspecified whether with hypoxia or hypercapnia: Secondary | ICD-10-CM | POA: Diagnosis present

## 2018-03-09 DIAGNOSIS — E876 Hypokalemia: Secondary | ICD-10-CM | POA: Diagnosis present

## 2018-03-09 DIAGNOSIS — Y95 Nosocomial condition: Secondary | ICD-10-CM | POA: Diagnosis present

## 2018-03-09 DIAGNOSIS — Z818 Family history of other mental and behavioral disorders: Secondary | ICD-10-CM

## 2018-03-09 DIAGNOSIS — J9601 Acute respiratory failure with hypoxia: Secondary | ICD-10-CM | POA: Diagnosis not present

## 2018-03-09 DIAGNOSIS — B974 Respiratory syncytial virus as the cause of diseases classified elsewhere: Secondary | ICD-10-CM | POA: Diagnosis present

## 2018-03-09 DIAGNOSIS — R0902 Hypoxemia: Secondary | ICD-10-CM | POA: Diagnosis not present

## 2018-03-09 DIAGNOSIS — I471 Supraventricular tachycardia: Secondary | ICD-10-CM | POA: Diagnosis not present

## 2018-03-09 DIAGNOSIS — J189 Pneumonia, unspecified organism: Principal | ICD-10-CM | POA: Diagnosis present

## 2018-03-09 DIAGNOSIS — Z8701 Personal history of pneumonia (recurrent): Secondary | ICD-10-CM

## 2018-03-09 DIAGNOSIS — E0829 Diabetes mellitus due to underlying condition with other diabetic kidney complication: Secondary | ICD-10-CM | POA: Diagnosis not present

## 2018-03-09 DIAGNOSIS — Z7189 Other specified counseling: Secondary | ICD-10-CM | POA: Diagnosis not present

## 2018-03-09 DIAGNOSIS — R569 Unspecified convulsions: Secondary | ICD-10-CM | POA: Diagnosis not present

## 2018-03-09 DIAGNOSIS — D649 Anemia, unspecified: Secondary | ICD-10-CM | POA: Diagnosis not present

## 2018-03-09 LAB — GLUCOSE, CAPILLARY
Glucose-Capillary: 238 mg/dL — ABNORMAL HIGH (ref 70–99)
Glucose-Capillary: 245 mg/dL — ABNORMAL HIGH (ref 70–99)
Glucose-Capillary: 88 mg/dL (ref 70–99)

## 2018-03-09 LAB — COMPREHENSIVE METABOLIC PANEL
ALT: 24 U/L (ref 0–44)
AST: 34 U/L (ref 15–41)
Albumin: 3.1 g/dL — ABNORMAL LOW (ref 3.5–5.0)
Alkaline Phosphatase: 113 U/L (ref 38–126)
Anion gap: 12 (ref 5–15)
BUN: 61 mg/dL — ABNORMAL HIGH (ref 8–23)
CO2: 27 mmol/L (ref 22–32)
Calcium: 9.5 mg/dL (ref 8.9–10.3)
Chloride: 123 mmol/L — ABNORMAL HIGH (ref 98–111)
Creatinine, Ser: 1.38 mg/dL — ABNORMAL HIGH (ref 0.44–1.00)
GFR calc Af Amer: 41 mL/min — ABNORMAL LOW (ref >60)
GFR, EST NON AFRICAN AMERICAN: 35 mL/min — AB (ref >60)
Glucose, Bld: 153 mg/dL — ABNORMAL HIGH (ref 70–99)
Potassium: 3.2 mmol/L — ABNORMAL LOW (ref 3.5–5.1)
Sodium: 162 mmol/L (ref 135–145)
Total Bilirubin: 0.6 mg/dL (ref 0.3–1.2)
Total Protein: 8.9 g/dL — ABNORMAL HIGH (ref 6.5–8.1)

## 2018-03-09 LAB — CBC WITH DIFFERENTIAL/PLATELET
Abs Immature Granulocytes: 0.23 10*3/uL — ABNORMAL HIGH (ref 0.00–0.07)
Basophils Absolute: 0.1 10*3/uL (ref 0.0–0.1)
Basophils Relative: 1 %
Eosinophils Absolute: 0 10*3/uL (ref 0.0–0.5)
Eosinophils Relative: 0 %
HCT: 49.7 % — ABNORMAL HIGH (ref 36.0–46.0)
Hemoglobin: 15 g/dL (ref 12.0–15.0)
Immature Granulocytes: 2 %
Lymphocytes Relative: 12 %
Lymphs Abs: 1.6 10*3/uL (ref 0.7–4.0)
MCH: 27.6 pg (ref 26.0–34.0)
MCHC: 30.2 g/dL (ref 30.0–36.0)
MCV: 91.4 fL (ref 80.0–100.0)
Monocytes Absolute: 1.3 10*3/uL — ABNORMAL HIGH (ref 0.1–1.0)
Monocytes Relative: 10 %
NEUTROS PCT: 75 %
Neutro Abs: 10.2 10*3/uL — ABNORMAL HIGH (ref 1.7–7.7)
Platelets: 170 10*3/uL (ref 150–400)
RBC: 5.44 MIL/uL — AB (ref 3.87–5.11)
RDW: 16.4 % — ABNORMAL HIGH (ref 11.5–15.5)
WBC: 13.5 10*3/uL — AB (ref 4.0–10.5)
nRBC: 0.1 % (ref 0.0–0.2)

## 2018-03-09 LAB — LACTIC ACID, PLASMA: Lactic Acid, Venous: 2.5 mmol/L (ref 0.5–1.9)

## 2018-03-09 LAB — URINALYSIS, ROUTINE W REFLEX MICROSCOPIC
Bilirubin Urine: NEGATIVE
Glucose, UA: NEGATIVE mg/dL
Hgb urine dipstick: NEGATIVE
Ketones, ur: 5 mg/dL — AB
LEUKOCYTES UA: NEGATIVE
Nitrite: NEGATIVE
Protein, ur: NEGATIVE mg/dL
Specific Gravity, Urine: 1.025 (ref 1.005–1.030)
pH: 5 (ref 5.0–8.0)

## 2018-03-09 LAB — SODIUM
Sodium: 162 mmol/L (ref 135–145)
Sodium: 162 mmol/L (ref 135–145)

## 2018-03-09 LAB — CG4 I-STAT (LACTIC ACID): LACTIC ACID, VENOUS: 3.1 mmol/L — AB (ref 0.5–1.9)

## 2018-03-09 LAB — PROCALCITONIN: Procalcitonin: 0.14 ng/mL

## 2018-03-09 LAB — MAGNESIUM: Magnesium: 2.1 mg/dL (ref 1.7–2.4)

## 2018-03-09 LAB — MRSA PCR SCREENING: MRSA by PCR: POSITIVE — AB

## 2018-03-09 LAB — TROPONIN I
Troponin I: 0.07 ng/mL (ref <0.03)
Troponin I: 0.05 ng/mL (ref <0.03)
Troponin I: 0.05 ng/mL (ref <0.03)

## 2018-03-09 MED ORDER — SODIUM CHLORIDE 0.9 % IV SOLN
2.0000 g | INTRAVENOUS | Status: DC
  Administered 2018-03-10 – 2018-03-12 (×2): 2 g via INTRAVENOUS
  Filled 2018-03-09 (×3): qty 2

## 2018-03-09 MED ORDER — ENOXAPARIN SODIUM 30 MG/0.3ML ~~LOC~~ SOLN
30.0000 mg | SUBCUTANEOUS | Status: DC
  Administered 2018-03-09 – 2018-03-10 (×2): 30 mg via SUBCUTANEOUS
  Filled 2018-03-09 (×2): qty 0.3

## 2018-03-09 MED ORDER — VANCOMYCIN HCL IN DEXTROSE 1-5 GM/200ML-% IV SOLN
1000.0000 mg | Freq: Once | INTRAVENOUS | Status: AC
  Administered 2018-03-09: 1000 mg via INTRAVENOUS
  Filled 2018-03-09: qty 200

## 2018-03-09 MED ORDER — IPRATROPIUM-ALBUTEROL 0.5-2.5 (3) MG/3ML IN SOLN
3.0000 mL | Freq: Four times a day (QID) | RESPIRATORY_TRACT | Status: DC
  Administered 2018-03-09 – 2018-03-12 (×10): 3 mL via RESPIRATORY_TRACT
  Filled 2018-03-09 (×10): qty 3

## 2018-03-09 MED ORDER — ENOXAPARIN SODIUM 40 MG/0.4ML ~~LOC~~ SOLN: 40.0000 mg | SUBCUTANEOUS | Status: DC

## 2018-03-09 MED ORDER — ACETAMINOPHEN 325 MG PO TABS
650.0000 mg | ORAL_TABLET | Freq: Four times a day (QID) | ORAL | Status: DC | PRN
  Administered 2018-03-13: 650 mg via ORAL
  Filled 2018-03-09: qty 2

## 2018-03-09 MED ORDER — MUPIROCIN 2 % EX OINT
1.0000 "application " | TOPICAL_OINTMENT | Freq: Two times a day (BID) | CUTANEOUS | Status: AC
  Administered 2018-03-09 – 2018-03-14 (×9): 1 via NASAL
  Filled 2018-03-09: qty 22

## 2018-03-09 MED ORDER — DEXTROSE 5 % IV SOLN
INTRAVENOUS | Status: DC
  Administered 2018-03-09 – 2018-03-13 (×5): via INTRAVENOUS

## 2018-03-09 MED ORDER — LEVOTHYROXINE SODIUM 100 MCG/5ML IV SOLN
75.0000 ug | Freq: Every day | INTRAVENOUS | Status: DC
  Administered 2018-03-10: 75 ug via INTRAVENOUS
  Filled 2018-03-09 (×2): qty 5

## 2018-03-09 MED ORDER — ONDANSETRON HCL 4 MG PO TABS: 4.0000 mg | ORAL_TABLET | Freq: Four times a day (QID) | ORAL | Status: DC | PRN

## 2018-03-09 MED ORDER — VALPROATE SODIUM 500 MG/5ML IV SOLN: 500.0000 mg | Freq: Two times a day (BID) | INTRAVENOUS | Status: DC

## 2018-03-09 MED ORDER — BUDESONIDE 0.25 MG/2ML IN SUSP
0.2500 mg | Freq: Two times a day (BID) | RESPIRATORY_TRACT | Status: DC
  Administered 2018-03-09 – 2018-03-14 (×11): 0.25 mg via RESPIRATORY_TRACT
  Filled 2018-03-09 (×11): qty 2

## 2018-03-09 MED ORDER — IPRATROPIUM-ALBUTEROL 0.5-2.5 (3) MG/3ML IN SOLN
3.0000 mL | RESPIRATORY_TRACT | Status: DC
  Administered 2018-03-09: 16:00:00 3 mL via RESPIRATORY_TRACT
  Filled 2018-03-09: qty 3

## 2018-03-09 MED ORDER — SODIUM CHLORIDE 0.9 % IV SOLN
500.0000 mg | INTRAVENOUS | Status: AC
  Administered 2018-03-09 – 2018-03-12 (×3): 500 mg via INTRAVENOUS
  Filled 2018-03-09 (×4): qty 500

## 2018-03-09 MED ORDER — VALPROATE SODIUM 500 MG/5ML IV SOLN
250.0000 mg | Freq: Four times a day (QID) | INTRAVENOUS | Status: DC
  Administered 2018-03-09 – 2018-03-13 (×14): 250 mg via INTRAVENOUS
  Filled 2018-03-09 (×19): qty 2.5

## 2018-03-09 MED ORDER — POTASSIUM CHLORIDE 10 MEQ/100ML IV SOLN
10.0000 meq | INTRAVENOUS | Status: AC
  Administered 2018-03-09 (×2): 10 meq via INTRAVENOUS
  Filled 2018-03-09 (×3): qty 100

## 2018-03-09 MED ORDER — ACETAMINOPHEN 650 MG RE SUPP: 650.0000 mg | Freq: Four times a day (QID) | RECTAL | Status: DC | PRN

## 2018-03-09 MED ORDER — SODIUM CHLORIDE 0.9 % IV SOLN
2.0000 g | Freq: Once | INTRAVENOUS | Status: AC
  Administered 2018-03-09: 2 g via INTRAVENOUS
  Filled 2018-03-09: qty 2

## 2018-03-09 MED ORDER — ONDANSETRON HCL 4 MG/2ML IJ SOLN: 4.0000 mg | Freq: Four times a day (QID) | INTRAMUSCULAR | Status: DC | PRN

## 2018-03-09 MED ORDER — INSULIN ASPART 100 UNIT/ML ~~LOC~~ SOLN
0.0000 [IU] | Freq: Three times a day (TID) | SUBCUTANEOUS | Status: DC
  Administered 2018-03-09: 7 [IU] via SUBCUTANEOUS
  Administered 2018-03-10: 3 [IU] via SUBCUTANEOUS
  Administered 2018-03-12 – 2018-03-16 (×2): 1 [IU] via SUBCUTANEOUS
  Filled 2018-03-09 (×5): qty 1

## 2018-03-09 MED ORDER — SODIUM CHLORIDE 0.9 % IV BOLUS
1000.0000 mL | Freq: Once | INTRAVENOUS | Status: AC
  Administered 2018-03-09: 1000 mL via INTRAVENOUS

## 2018-03-09 MED ORDER — CHLORHEXIDINE GLUCONATE CLOTH 2 % EX PADS
6.0000 | MEDICATED_PAD | Freq: Every day | CUTANEOUS | Status: AC
  Administered 2018-03-10 – 2018-03-14 (×5): 6 via TOPICAL

## 2018-03-09 NOTE — ED Notes (Signed)
Report to Ashley, RN

## 2018-03-09 NOTE — Progress Notes (Signed)
PHARMACIST - PHYSICIAN COMMUNICATION  CONCERNING:  Enoxaparin (Lovenox) for DVT Prophylaxis    RECOMMENDATION: Patient was prescribed enoxaprin 40mg  q24 hours for VTE prophylaxis.   Filed Weights   03/09/18 1027  Weight: 88 lb 6.4 oz (40.1 kg)    Body mass index is 14.71 kg/m.  Estimated Creatinine Clearance: 19.2 mL/min (A) (by C-G formula based on SCr of 1.38 mg/dL (H)).   Patient is candidate for enoxaparin 30mg  every 24 hours based on CrCl <47ml/min or Weight less then 45kg for female and 50kg for female  DESCRIPTION: Pharmacy has adjusted enoxaparin dose per [facility] policy.  Patient is now receiving enoxaparin 30mg  every 24 hours.  Forrest Moron, PharmD Clinical Pharmacist  03/09/2018 1:58 PM

## 2018-03-09 NOTE — ED Notes (Signed)
Pt placed on 15L NRB. 89% on 4L 

## 2018-03-09 NOTE — ED Notes (Signed)
Vancomycin complete. Pump stopped and tubing disconnected.

## 2018-03-09 NOTE — ED Triage Notes (Signed)
Arrives to ER from [facility] for SOB and congestion. Pt 70's with staff at [facility] for oxygen saturation. Pt placed on 4L [place] by EMS, 88% on 4L at time of arrival. Pt at baseline mentality for EMS staff. Rattling cough.

## 2018-03-09 NOTE — ED Provider Notes (Signed)
[facility] Emergency Department Provider Note   ____________________________________________    I have reviewed the triage vital signs and the nursing notes.   HISTORY  Chief Complaint Shortness of Breath   Limited by dementia  HPI Jill Copeland is a 84 y.o. female who presents with shortness of breath.  [facility], reported cough, oxygen saturations in the 70s, they started 4 L nasal cannula and transferred here to the emergency department, patient unable to give significant history due to dementia   Past Medical History:  Diagnosis Date  . Alzheimer disease ([place])   . Arthritis   . Bacteremia    a. 09/2016 - admit w/ S hominis bacteremia w/ ? of pacer lead vegetation;  b. 09/2016 TEE:  unable to fully pass TEE probe due to esoph stricture; c. IV Abx per ID.  . Diabetes mellitus without complication ([place])   . Dysphagia causing pulmonary aspiration with swallowing   . Esophageal stricture    a. @ site of anastamosis from hiatal hernia surgery.  Marland Kitchen GERD (gastroesophageal reflux disease)   . Hiatal hernia    a. s/p prior repair with fistula;  b. 09/2016 CT chest: moderate to large hiatal hernia - stable.  . Hypothyroidism   . Pneumococcal pneumonia ([place])   . Recurrent pneumonia   . Recurrent UTI   . Schizo affective schizophrenia ([place])   . Stroke (cerebrum) ([place])   . Symptomatic bradycardia    a. s/p biotronik PPM 02/2005 at [facility] in [place], [place]  . Syncope    a. 02/2005 s/p Biotronik DC PPM @ [facility] in [place], [place].    Patient Active Problem List   Diagnosis Date Noted  . Sepsis ([place]) 04/08/2017  . A-fib ([place]) 04/08/2017  . Asthma 03/11/2017  . Pain management 03/11/2017  . Advanced care planning/counseling discussion 11/19/2016  . Impaired mobility and ADLs 11/19/2016  . Adventitious breath sounds 11/19/2016  . Pacemaker infection ([place]) 10/11/2016  . Endocarditis 10/09/2016  .  Schizoaffective disorder ([place])   . Pressure ulcer 01/13/2015  . Multiple falls 01/12/2015  . Overactive bladder 12/30/2014  . Alzheimer disease ([place])   . Schizo affective schizophrenia ([place])   . Recurrent pneumonia   . Dysphagia causing pulmonary aspiration with swallowing   . Pneumococcal pneumonia ([place])   . Arrhythmia   . Esophageal stricture   . Hypothyroidism   . Lung nodule   . Dementia in chronic schizophrenia ([place]) 08/12/2014  . Social discord 08/12/2014  . Family conflict 01/77/9390    Past Surgical History:  Procedure Laterality Date  . HERNIA REPAIR     hiatal  . JEJUNOSTOMY FEEDING TUBE    . PACEMAKER INSERTION    . TEE WITHOUT CARDIOVERSION N/A 10/11/2016   Procedure: TRANSESOPHAGEAL ECHOCARDIOGRAM (TEE);  Surgeon: Wellington Hampshire, MD;  Location: [facility] ORS;  Service: Cardiovascular;  Laterality: N/A;    Prior to Admission medications   Medication Sig Start Date End Date Taking? Authorizing Provider  acetaminophen (ARTHRITIS PAIN RELIEF) 650 MG CR tablet Take 1 tablet by mouth every 6 (six) hours as needed.   Yes [provider]  albuterol (PROVENTIL HFA;VENTOLIN HFA) 108 (90 Base) MCG/ACT inhaler Inhale 2 puffs into the lungs every 6 (six) hours as needed for wheezing or shortness of breath. 03/16/17  Yes Harvest Dark, MD  Amino Acids-Protein Hydrolys (FEEDING SUPPLEMENT, PRO-STAT SUGAR FREE 64,) LIQD Take 30 mLs by mouth 3 (three) times daily with meals.   Yes  [provider]  ascorbic acid (VITAMIN C) 500 MG tablet Take 250 mg by mouth 2 (two) times daily.   Yes [provider]  Cholecalciferol 25 MCG (1000 UT) tablet Take 1,000 Units by mouth daily.    Yes [provider]  divalproex (DEPAKOTE ER) 250 MG 24 hr tablet Take 1 tablet (250 mg total) by mouth daily. Patient taking differently: Take 500 mg by mouth 2 (two) times daily.  03/16/17  Yes Harvest Dark, MD  docusate sodium (COLACE) 100 MG capsule Take 100 mg by  mouth 2 (two) times daily.   Yes [provider]  ferrous sulfate 325 (65 FE) MG tablet Take 325 mg by mouth daily with breakfast.   Yes [provider]  fluPHENAZine (PROLIXIN) 5 MG tablet Take 7.5 mg by mouth 2 (two) times daily.    Yes [provider]  fluticasone (FLONASE) 50 MCG/ACT nasal spray Place 2 sprays into both nostrils daily. 03/16/17  Yes Harvest Dark, MD  Fluticasone-Salmeterol (ADVAIR DISKUS) 250-50 MCG/DOSE AEPB Inhale 1 puff into the lungs 2 (two) times daily. 03/16/17  Yes Harvest Dark, MD  furosemide (LASIX) 20 MG tablet Take 1 tablet (20 mg total) by mouth every other day. 03/16/17  Yes Harvest Dark, MD  gabapentin (NEURONTIN) 100 MG capsule Take 2 capsules (200 mg total) by mouth 3 (three) times daily. 03/16/17  Yes Harvest Dark, MD  ipratropium-albuterol (DUONEB) 0.5-2.5 (3) MG/3ML SOLN Take 3 mLs by nebulization every 6 (six) hours as needed.   Yes [provider]  levothyroxine (SYNTHROID, LEVOTHROID) 125 MCG tablet Take 1 tablet (125 mcg total) by mouth daily before breakfast. Patient taking differently: Take 150 mcg by mouth daily before breakfast.  03/16/17  Yes Harvest Dark, MD  loratadine (CLARITIN) 10 MG tablet Take 10 mg by mouth daily.   Yes [provider]  LORazepam (ATIVAN) 0.5 MG tablet Take 0.25 mg by mouth daily.   Yes [provider]  Melatonin 3 MG TABS Take 3 mg by mouth at bedtime.   Yes [provider]  memantine (NAMENDA) 5 MG tablet Take 1 tablet (5 mg total) by mouth 2 (two) times daily. 03/16/17  Yes Harvest Dark, MD  metoprolol tartrate (LOPRESSOR) 25 MG tablet Take 0.5 tablets (12.5 mg total) by mouth 2 (two) times daily. 03/16/17  Yes Harvest Dark, MD  Multiple Vitamin (MULTIVITAMIN WITH MINERALS) TABS tablet Take 1 tablet by mouth daily.   Yes [provider]  ondansetron (ZOFRAN) 8 MG tablet Take 1 tablet (8 mg total) by mouth every 8  (eight) hours as needed for nausea or vomiting. 03/11/17  Yes Kathrine Haddock, NP  potassium chloride (K-DUR,KLOR-CON) 10 MEQ tablet Take 1 tablet (10 mEq total) by mouth every other day. 03/16/17  Yes Harvest Dark, MD  saccharomyces boulardii (FLORASTOR) 250 MG capsule Take 1 capsule (250 mg total) by mouth 2 (two) times daily. 01/01/15  Yes Hosie Poisson, MD  senna (SENOKOT) 8.6 MG TABS tablet Take 1 tablet by mouth 2 (two) times a week. Mondays and Fridays   Yes [provider]  sertraline (ZOLOFT) 50 MG tablet Take 1 tablet (50 mg total) by mouth at bedtime. Patient taking differently: Take 100 mg by mouth daily.  03/16/17  Yes Harvest Dark, MD  sodium chloride 1 G tablet Take 1 g by mouth 3 (three) times daily.   Yes [provider]  traMADol (ULTRAM) 50 MG tablet Take 50 mg by mouth every 4 (four) hours as needed.  Yes [provider]  azithromycin (ZITHROMAX) 500 MG tablet Take 1 tablet (500 mg total) by mouth daily. For 3 days Patient not taking: Reported on 03/09/2018 04/11/17   Fritzi Mandes, MD  insulin aspart (NOVOLOG) 100 UNIT/ML injection Inject 0-9 Units into the skin 3 (three) times daily with meals. Patient not taking: Reported on 03/09/2018 04/11/17   Fritzi Mandes, MD  LORazepam (ATIVAN) 0.5 MG tablet Take 1 tablet (0.5 mg total) by mouth 2 (two) times daily as needed for anxiety. Patient not taking: Reported on 04/08/2017 03/16/17   Harvest Dark, MD  nitrofurantoin (MACRODANTIN) 100 MG capsule Take 100 mg by mouth 2 (two) times daily.    [provider]  OLANZapine (ZYPREXA) 10 MG tablet Take 0.5 tablets (5 mg total) by mouth 2 (two) times daily. Patient not taking: Reported on 03/09/2018 03/16/17   Harvest Dark, MD     Allergies Penicillins  Family History  Problem Relation Age of Onset  . Thyroid cancer Grandchild   . Thyroid nodules Daughter   . Thyroid cancer Daughter   . Graves' disease Daughter   . Dementia  Mother   . Schizophrenia Maternal Aunt   . Thyroid disease Son   . Cancer Son   . Cancer Brother   . Cancer Brother     Social History Social History   Tobacco Use  . Smoking status: Never Smoker  . Smokeless tobacco: Never Used  Substance Use Topics  . Alcohol use: No    Alcohol/week: 0.0 standard drinks  . Drug use: No    Level 5 caveat: Unable to obtain review of Systems due to dementia     ____________________________________________   PHYSICAL EXAM:  VITAL SIGNS: ED Triage Vitals  Enc Vitals Group     BP 03/09/18 1025 (!) 155/69     Pulse Rate 03/09/18 1025 96     Resp 03/09/18 1025 18     Temp 03/09/18 1025 98.2 F (36.8 C)     Temp Source 03/09/18 1025 Oral     SpO2 03/09/18 1025 91 %     Weight 03/09/18 1027 40.1 kg (88 lb 6.4 oz)     Height 03/09/18 1027 1.651 m (5\' 5" )     Head Circumference --      [facility] --      Pain Score --      Pain Loc --      Pain Edu? --      Excl. in [place]? --     Constitutional: Alert, ill-appearing Eyes: Conjunctivae are normal.  Head: Atraumatic. Nose: No congestion/rhinnorhea. Mouth/Throat: Mucous membranes are moist.    Cardiovascular: Normal rate, regular rhythm. Grossly normal heart sounds.  Good peripheral circulation. Respiratory: Increased respiratory effort with tachypnea, bibasilar Rales Gastrointestinal: Soft and nontender. No distention.   Musculoskeletal: No lower extremity tenderness nor edema.  Warm and well perfused Neurologic:   No gross focal neurologic deficits are appreciated.  Skin:  Skin is warm, dry and intact. No rash noted.   ____________________________________________   LABS (all labs ordered are listed, but only abnormal results are displayed)  Labs Reviewed  COMPREHENSIVE METABOLIC PANEL - Abnormal; Notable for the following components:      Result Value   Sodium 162 (*)    Potassium 3.2 (*)    Chloride 123 (*)    Glucose, Bld 153 (*)    BUN 61 (*)    Creatinine, Ser 1.38  (*)    Total Protein 8.9 (*)  Albumin 3.1 (*)    GFR calc non Af Amer 35 (*)    GFR calc Af Amer 41 (*)    All other components within normal limits  CBC WITH DIFFERENTIAL/PLATELET - Abnormal; Notable for the following components:   WBC 13.5 (*)    RBC 5.44 (*)    HCT 49.7 (*)    RDW 16.4 (*)    Neutro Abs 10.2 (*)    Monocytes Absolute 1.3 (*)    Abs Immature Granulocytes 0.23 (*)    All other components within normal limits  URINALYSIS, ROUTINE W REFLEX MICROSCOPIC - Abnormal; Notable for the following components:   Color, Urine YELLOW (*)    APPearance CLEAR (*)    Ketones, ur 5 (*)    All other components within normal limits  TROPONIN I - Abnormal; Notable for the following components:   Troponin I 0.07 (*)    All other components within normal limits  GLUCOSE, CAPILLARY - Abnormal; Notable for the following components:   Glucose-Capillary 238 (*)    All other components within normal limits  CG4 I-STAT (LACTIC ACID) - Abnormal; Notable for the following components:   Lactic Acid, Venous 3.10 (*)    All other components within normal limits  CULTURE, BLOOD (ROUTINE X 2)  CULTURE, BLOOD (ROUTINE X 2)  URINE CULTURE  MRSA PCR SCREENING  PROCALCITONIN  LACTIC ACID, PLASMA  I-STAT CG4 LACTIC ACID, ED   ____________________________________________  EKG  ED ECG REPORT I, Lavonia Drafts, the attending physician, personally viewed and interpreted this ECG.  Date: 03/09/2018   Rhythm: normal sinus rhythm QRS Axis: normal Intervals: normal ST/T Wave abnormalities: ST depressions Narrative Interpretation: Concerning for acute ischemia, likely demand related given severe shortness of breath which I suspect is related to pneumonia  ____________________________________________  RADIOLOGY  Chest x-ray demonstrates left-sided pleural effusion with pneumonia ____________________________________________   PROCEDURES  Procedure(s) performed:  No  Procedures   Critical Care performed: yes  CRITICAL CARE Performed by: Lavonia Drafts   Total critical care time: 30 minutes  Critical care time was exclusive of separately billable procedures and treating other patients.  Critical care was necessary to treat or prevent imminent or life-threatening deterioration.  Critical care was time spent personally by me on the following activities: development of treatment plan with patient and/or surrogate as well as nursing, discussions with consultants, evaluation of patient's response to treatment, examination of patient, obtaining history from patient or surrogate, ordering and performing treatments and interventions, ordering and review of laboratory studies, ordering and review of radiographic studies, pulse oximetry and re-evaluation of patient's condition.  ____________________________________________   INITIAL IMPRESSION / ASSESSMENT AND PLAN / ED COURSE  Pertinent labs & imaging results that were available during my care of the patient were reviewed by me and considered in my medical decision making (see chart for details).  Patient presents with shortness of breath, on 4 L nasal cannula she is satting in the low 90s, she has a rattling cough with rales on exam.  Strong suspicion for pneumonia versus pulmonary edema.  EKG concerning for ischemia but does not meet STEMI criteria and is likely demand related.  Pending chest x-ray and i-STAT lactic  Lactic is elevated at 3.1, broad-spectrum antibiotics ordered, code sepsis called.  Blood pressure is stable, no indication for 30 mils per kilogram at this time.  1 L ordered.  Lab work is significant for elevated troponin, likely demand ischemia.  Sodium is critically high at 162.  Chest x-ray  confirms pneumonia with effusion, patient admitted to the hospitalist service    ____________________________________________   FINAL CLINICAL IMPRESSION(S) / ED DIAGNOSES  Final  diagnoses:  Shortness of breath        Note:  This document was prepared using Dragon voice recognition software and may include unintentional dictation errors.    Lavonia Drafts, MD 03/09/18 1210

## 2018-03-09 NOTE — Progress Notes (Signed)
CODE SEPSIS - PHARMACY COMMUNICATION  **Broad Spectrum Antibiotics should be administered within 1 hour of Sepsis diagnosis**  Time Code Sepsis Called/Page Received: 1049  Antibiotics Ordered: Azithromycin, Cefepime, Vancomycin   Time of 1st antibiotic administration: 1102 - Cefepime    Additional action taken by pharmacy: Procalcitonin and MRSA PCR ordered   If necessary, Name of Provider/Nurse Contacted: none indicated     Simpson,Michael L ,PharmD Clinical Pharmacist  03/09/2018  10:49 AM

## 2018-03-09 NOTE — ED Notes (Signed)
X-ray at bedside

## 2018-03-09 NOTE — Consult Note (Signed)
Pharmacy Antibiotic Note  Jill Copeland is a 84 y.o. female admitted on 03/09/2018 with pneumonia.  Pharmacy has been consulted for cefepime and vancomycin dosing.  Plan: Vancomycin 1000 mg IV once followed by 38 hour stacked dosing vancomycin 500 mg every 36 hours.  Goal trough 15-20 mcg/mL.  Will draw random level approximately 24 hours after initial administration to assess clearance.  CrCl - 19.21 SCr - 1.38 ke - 0.02 T1/2 - 34.65 VD - 28.07 Estimated Cmax steady state - 34.04 Estimated Cmin steady state - 16.9  Cefepime 2 gm IV every 24 hours  Height: 5\' 5"  (165.1 cm) Weight: 88 lb 6.4 oz (40.1 kg) IBW/kg (Calculated) : 57  Temp (24hrs), Avg:99.4 F (37.4 C), Min:98.2 F (36.8 C), Max:100.5 F (38.1 C)  Recent Labs  Lab 03/09/18 1029 03/09/18 1045  WBC 13.5*  --   CREATININE 1.38*  --   LATICACIDVEN  --  3.10*    Estimated Creatinine Clearance: 19.2 mL/min (A) (by C-G formula based on SCr of 1.38 mg/dL (H)).    Allergies  Allergen Reactions  . Penicillins Other (See Comments)    Has patient had a PCN reaction causing immediate rash, facial/tongue/throat swelling, SOB or lightheadedness with hypotension: Unknown Has patient had a PCN reaction causing severe rash involving mucus membranes or skin necrosis: Unknown Has patient had a PCN reaction that required hospitalization: Unknown Has patient had a PCN reaction occurring within the last 10 years: Unknown If all of the above answers are "NO", then may proceed with Cephalosporin use.     Antimicrobials this admission: Cefepime 12/19 >>  Vanco 12/19 >>  Azithromycin 12/19 >>  Dose adjustments this admission:   Microbiology results: 12/19 BCx: pending 12/19 UCx: pending   Sputum:   12/19 MRSA PCR: pending  Thank you for allowing pharmacy to be a part of this patient's care.  Forrest Moron, PharmD Clinical Pharmacist 03/09/2018 12:16 PM

## 2018-03-09 NOTE — H&P (Addendum)
[facility] NAME: Jill Copeland    MR#:  [id number]  DATE OF BIRTH:  16-Feb-1932  DATE OF ADMISSION:  03/09/2018  PRIMARY CARE PHYSICIAN: Kathrine Haddock, NP   REQUESTING/REFERRING PHYSICIAN: Lavonia Drafts, MD  CHIEF COMPLAINT:   Chief Complaint  Patient presents with  . Shortness of Breath    HISTORY OF PRESENT ILLNESS: Jill Copeland  is a 84 y.o. female with a known history of Alzheimer's disease, diabetes type 2, history of dysphasia, esophageal stricture, GERD, hypothyroidism, recurrent pneumonia who is sent from [facility] with complaint of cough oxygen saturations in the 70s.  Patient had to be placed on 4 L of oxygen.  Here she is requiring high flow oxygen at 10 L.  Patient with dementia unable to provide any review of systems.  Chest x-ray showed large hiatal hernia as well as left effusion and left lower lobe airspace opacities. PAST MEDICAL HISTORY:   Past Medical History:  Diagnosis Date  . Alzheimer disease ([place])   . Arthritis   . Bacteremia    a. 09/2016 - admit w/ S hominis bacteremia w/ ? of pacer lead vegetation;  b. 09/2016 TEE:  unable to fully pass TEE probe due to esoph stricture; c. IV Abx per ID.  . Diabetes mellitus without complication ([place])   . Dysphagia causing pulmonary aspiration with swallowing   . Esophageal stricture    a. @ site of anastamosis from hiatal hernia surgery.  Marland Kitchen GERD (gastroesophageal reflux disease)   . Hiatal hernia    a. s/p prior repair with fistula;  b. 09/2016 CT chest: moderate to large hiatal hernia - stable.  . Hypothyroidism   . Pneumococcal pneumonia ([place])   . Recurrent pneumonia   . Recurrent UTI   . Schizo affective schizophrenia ([place])   . Stroke (cerebrum) ([place])   . Symptomatic bradycardia    a. s/p biotronik PPM 02/2005 at [facility] in [place], [place]  . Syncope    a. 02/2005 s/p Biotronik DC PPM @ [facility] in [place], [place].    PAST SURGICAL HISTORY:   Past Surgical History:  Procedure Laterality Date  . HERNIA REPAIR     hiatal  . JEJUNOSTOMY FEEDING TUBE    . PACEMAKER INSERTION    . TEE WITHOUT CARDIOVERSION N/A 10/11/2016   Procedure: TRANSESOPHAGEAL ECHOCARDIOGRAM (TEE);  Surgeon: Wellington Hampshire, MD;  Location: [facility] ORS;  Service: Cardiovascular;  Laterality: N/A;    SOCIAL HISTORY:  Social History   Tobacco Use  . Smoking status: Never Smoker  . Smokeless tobacco: Never Used  Substance Use Topics  . Alcohol use: No    Alcohol/week: 0.0 standard drinks    FAMILY HISTORY:  Family History  Problem Relation Age of Onset  . Thyroid cancer Grandchild   . Thyroid nodules Daughter   . Thyroid cancer Daughter   . Graves' disease Daughter   . Dementia Mother   . Schizophrenia Maternal Aunt   . Thyroid disease Son   . Cancer Son   . Cancer Brother   . Cancer Brother     DRUG ALLERGIES:  Allergies  Allergen Reactions  . Penicillins Other (See Comments)    Has patient had a PCN reaction causing immediate rash, facial/tongue/throat swelling, SOB or lightheadedness with hypotension: Unknown Has patient had a PCN reaction causing severe rash involving mucus membranes or skin necrosis: Unknown Has patient had a PCN reaction that required hospitalization: Unknown Has patient had a PCN  reaction occurring within the last 10 years: Unknown If all of the above answers are "NO", then may proceed with Cephalosporin use.     REVIEW OF SYSTEMS:   CONSTITUTIONAL: Unable to provide due to her dementia  MEDICATIONS AT HOME:  Prior to Admission medications   Medication Sig Start Date End Date Taking? Authorizing Provider  acetaminophen (ARTHRITIS PAIN RELIEF) 650 MG CR tablet Take 1 tablet by mouth every 6 (six) hours as needed.   Yes [provider]  albuterol (PROVENTIL HFA;VENTOLIN HFA) 108 (90 Base) MCG/ACT inhaler Inhale 2 puffs into the lungs every 6 (six) hours as needed for wheezing or shortness of breath.  03/16/17  Yes Harvest Dark, MD  Amino Acids-Protein Hydrolys (FEEDING SUPPLEMENT, PRO-STAT SUGAR FREE 64,) LIQD Take 30 mLs by mouth 3 (three) times daily with meals.   Yes [provider]  ascorbic acid (VITAMIN C) 500 MG tablet Take 250 mg by mouth 2 (two) times daily.   Yes [provider]  Cholecalciferol 25 MCG (1000 UT) tablet Take 1,000 Units by mouth daily.    Yes [provider]  divalproex (DEPAKOTE ER) 250 MG 24 hr tablet Take 1 tablet (250 mg total) by mouth daily. Patient taking differently: Take 500 mg by mouth 2 (two) times daily.  03/16/17  Yes Harvest Dark, MD  docusate sodium (COLACE) 100 MG capsule Take 100 mg by mouth 2 (two) times daily.   Yes [provider]  ferrous sulfate 325 (65 FE) MG tablet Take 325 mg by mouth daily with breakfast.   Yes [provider]  fluPHENAZine (PROLIXIN) 5 MG tablet Take 7.5 mg by mouth 2 (two) times daily.    Yes [provider]  fluticasone (FLONASE) 50 MCG/ACT nasal spray Place 2 sprays into both nostrils daily. 03/16/17  Yes Harvest Dark, MD  Fluticasone-Salmeterol (ADVAIR DISKUS) 250-50 MCG/DOSE AEPB Inhale 1 puff into the lungs 2 (two) times daily. 03/16/17  Yes Harvest Dark, MD  furosemide (LASIX) 20 MG tablet Take 1 tablet (20 mg total) by mouth every other day. 03/16/17  Yes Harvest Dark, MD  gabapentin (NEURONTIN) 100 MG capsule Take 2 capsules (200 mg total) by mouth 3 (three) times daily. 03/16/17  Yes Harvest Dark, MD  ipratropium-albuterol (DUONEB) 0.5-2.5 (3) MG/3ML SOLN Take 3 mLs by nebulization every 6 (six) hours as needed.   Yes [provider]  levothyroxine (SYNTHROID, LEVOTHROID) 125 MCG tablet Take 1 tablet (125 mcg total) by mouth daily before breakfast. Patient taking differently: Take 150 mcg by mouth daily before breakfast.  03/16/17  Yes Harvest Dark, MD  loratadine (CLARITIN) 10 MG tablet Take 10 mg by mouth daily.    Yes [provider]  LORazepam (ATIVAN) 0.5 MG tablet Take 0.25 mg by mouth daily.   Yes [provider]  Melatonin 3 MG TABS Take 3 mg by mouth at bedtime.   Yes [provider]  memantine (NAMENDA) 5 MG tablet Take 1 tablet (5 mg total) by mouth 2 (two) times daily. 03/16/17  Yes Harvest Dark, MD  metoprolol tartrate (LOPRESSOR) 25 MG tablet Take 0.5 tablets (12.5 mg total) by mouth 2 (two) times daily. 03/16/17  Yes Harvest Dark, MD  Multiple Vitamin (MULTIVITAMIN WITH MINERALS) TABS tablet Take 1 tablet by mouth daily.   Yes [provider]  ondansetron (ZOFRAN) 8 MG tablet Take 1 tablet (8 mg total) by mouth every 8 (eight) hours as needed for nausea or vomiting. 03/11/17  Yes Kathrine Haddock, NP  potassium  chloride (K-DUR,KLOR-CON) 10 MEQ tablet Take 1 tablet (10 mEq total) by mouth every other day. 03/16/17  Yes Harvest Dark, MD  saccharomyces boulardii (FLORASTOR) 250 MG capsule Take 1 capsule (250 mg total) by mouth 2 (two) times daily. 01/01/15  Yes Hosie Poisson, MD  senna (SENOKOT) 8.6 MG TABS tablet Take 1 tablet by mouth 2 (two) times a week. Mondays and Fridays   Yes [provider]  sertraline (ZOLOFT) 50 MG tablet Take 1 tablet (50 mg total) by mouth at bedtime. Patient taking differently: Take 100 mg by mouth daily.  03/16/17  Yes Harvest Dark, MD  sodium chloride 1 G tablet Take 1 g by mouth 3 (three) times daily.   Yes [provider]  traMADol (ULTRAM) 50 MG tablet Take 50 mg by mouth every 4 (four) hours as needed.   Yes [provider]  azithromycin (ZITHROMAX) 500 MG tablet Take 1 tablet (500 mg total) by mouth daily. For 3 days Patient not taking: Reported on 03/09/2018 04/11/17   Fritzi Mandes, MD  insulin aspart (NOVOLOG) 100 UNIT/ML injection Inject 0-9 Units into the skin 3 (three) times daily with meals. Patient not taking: Reported on 03/09/2018 04/11/17   Fritzi Mandes, MD  LORazepam  (ATIVAN) 0.5 MG tablet Take 1 tablet (0.5 mg total) by mouth 2 (two) times daily as needed for anxiety. Patient not taking: Reported on 04/08/2017 03/16/17   Harvest Dark, MD  nitrofurantoin (MACRODANTIN) 100 MG capsule Take 100 mg by mouth 2 (two) times daily.    [provider]  OLANZapine (ZYPREXA) 10 MG tablet Take 0.5 tablets (5 mg total) by mouth 2 (two) times daily. Patient not taking: Reported on 03/09/2018 03/16/17   Harvest Dark, MD      PHYSICAL EXAMINATION:   VITAL SIGNS: Blood pressure (!) 146/65, pulse 97, temperature (!) 100.5 F (38.1 C), temperature source Rectal, resp. rate (!) 23, height 5\' 5"  (1.651 m), weight 40.1 kg, SpO2 98 %.  GENERAL:  84 y.o.-year-old patient lying in the bed critically ill-appearing EYES: Pupils equal, round, reactive to light and accommodation. No scleral icterus. Extraocular muscles intact.  HEENT: Head atraumatic, normocephalic. Oropharynx and nasopharynx clear.  NECK:  Supple, no jugular venous distention. No thyroid enlargement, no tenderness.  LUNGS: Rhonchus breath sounds bilaterally with some accessory muscle usage.  CARDIOVASCULAR: S1, S2 normal. No murmurs, rubs, or gallops.  ABDOMEN: Soft, nontender, nondistended. Bowel sounds present. No organomegaly or mass.  EXTREMITIES: No pedal edema, cyanosis, or clubbing.  NEUROLOGIC: Patient awake but unable to follow commands PSYCHIATRIC: Patient awake but unable to follow commands SKIN: No obvious rash, lesion, or ulcer.   LABORATORY PANEL:   CBC Recent Labs  Lab 03/09/18 1029  WBC 13.5*  HGB 15.0  HCT 49.7*  PLT 170  MCV 91.4  MCH 27.6  MCHC 30.2  RDW 16.4*  LYMPHSABS 1.6  MONOABS 1.3*  EOSABS 0.0  BASOSABS 0.1   ------------------------------------------------------------------------------------------------------------------  Chemistries  Recent Labs  Lab 03/09/18 1029  NA 162*  K 3.2*  CL 123*  CO2 27  GLUCOSE 153*  BUN 61*  CREATININE 1.38*   CALCIUM 9.5  AST 34  ALT 24  ALKPHOS 113  BILITOT 0.6   ------------------------------------------------------------------------------------------------------------------ estimated creatinine clearance is 19.2 mL/min (A) (by C-G formula based on SCr of 1.38 mg/dL (H)). ------------------------------------------------------------------------------------------------------------------ No results for input(s): TSH, T4TOTAL, T3FREE, THYROIDAB in the last 72 hours.  Invalid input(s): FREET3   Coagulation profile No results for input(s): INR, PROTIME in the last  168 hours. ------------------------------------------------------------------------------------------------------------------- No results for input(s): DDIMER in the last 72 hours. -------------------------------------------------------------------------------------------------------------------  Cardiac Enzymes Recent Labs  Lab 03/09/18 1029  TROPONINI 0.07*   ------------------------------------------------------------------------------------------------------------------ Invalid input(s): POCBNP  ---------------------------------------------------------------------------------------------------------------  Urinalysis    Component Value Date/Time   COLORURINE YELLOW (A) 03/09/2018 1029   APPEARANCEUR CLEAR (A) 03/09/2018 1029   LABSPEC 1.025 03/09/2018 1029   PHURINE 5.0 03/09/2018 1029   GLUCOSEU NEGATIVE 03/09/2018 1029   HGBUR NEGATIVE 03/09/2018 1029   BILIRUBINUR NEGATIVE 03/09/2018 1029   KETONESUR 5 (A) 03/09/2018 1029   PROTEINUR NEGATIVE 03/09/2018 1029   UROBILINOGEN 1.0 12/30/2014 0746   NITRITE NEGATIVE 03/09/2018 1029   LEUKOCYTESUR NEGATIVE 03/09/2018 1029     RADIOLOGY: Dg Chest Port 1 View  Result Date: 03/09/2018 CLINICAL DATA:  Shortness of Breath EXAM: PORTABLE CHEST 1 VIEW COMPARISON:  04/08/2017 FINDINGS: Large hiatal hernia. Airspace disease in the left mid and lower lung with probable  left effusion. Right lung clear. Heart is normal size. Left-sided pacer with leads in the right atrium and right ventricle. IMPRESSION: Large hiatal hernia. Left effusion with left lower lobe airspace opacity concerning for pneumonia. Electronically Signed   By: Rolm Baptise M.D.   On: 03/09/2018 11:19    EKG: Orders placed or performed during the hospital encounter of 03/09/18  . EKG 12-Lead  . EKG 12-Lead    IMPRESSION AND PLAN: Patient is a 84 year old being admitted with pneumonia  1.  Recurrent pneumonia could be H CAP or aspiration we will treat with IV vancomycin and cefepime, if no improvement will change to cover aspiration pneumonia Swallow eval, keep n.p.o. for now  2.  Acute respiratory failure due to #1 continue oxygen  3.  Severe hypernatremia related to free water deficit I will place patient on D5 W  4.  Diabetes type 2 I will place her on sliding scale insulin monitor blood sugars  5.  Dementia continue supportive care  6 hypothyroidism I will give her IV Synthroid until able to take oral properly  7.  CODE STATUS full I try to reach the emergency contact unable to reach his his voicemail is full palliative care consult  All the records are reviewed and case discussed with ED provider. Management plans discussed with the patient, family and they are in agreement.  CODE STATUS: Code Status History    Date Active Date Inactive Code Status Order ID Comments User Context   04/08/2017 1800 04/11/2017 2015 Full Code [id number]  Hillary Bow, MD ED   10/09/2016 1605 10/14/2016 2001 Full Code [id number]  Bettey Costa, MD Inpatient   10/02/2016 1627 10/05/2016 1653 Full Code [id number]  Fritzi Mandes, MD Inpatient   01/12/2015 2233 01/15/2015 0009 Full Code [id number]  Lavetta Nielsen, Aaron Mose, MD ED   12/30/2014 1637 01/02/2015 2100 DNR [id number]  Erick Colace, NP Inpatient   12/30/2014 1153 12/30/2014 1634 Full Code [id number]  Janece Canterbury, MD Inpatient       TOTAL TIME TAKING  CARE OF THIS PATIENT: 55 minutes critical care .    Dustin Flock M.D on 03/09/2018 at 12:30 PM  Between 7am to 6pm - Pager - [phone]  After 6pm go to [email or web address] - password [email or web address]  [facility] Office  [phone]  CC: Primary care physician; Kathrine Haddock, NP

## 2018-03-10 DIAGNOSIS — Z7189 Other specified counseling: Secondary | ICD-10-CM

## 2018-03-10 DIAGNOSIS — R0602 Shortness of breath: Secondary | ICD-10-CM

## 2018-03-10 DIAGNOSIS — Z515 Encounter for palliative care: Secondary | ICD-10-CM

## 2018-03-10 LAB — SODIUM
Sodium: 158 mmol/L — ABNORMAL HIGH (ref 135–145)
Sodium: 156 mmol/L — ABNORMAL HIGH (ref 135–145)
Sodium: 152 mmol/L — ABNORMAL HIGH (ref 135–145)
Sodium: 152 mmol/L — ABNORMAL HIGH (ref 135–145)

## 2018-03-10 LAB — BASIC METABOLIC PANEL
Anion gap: 11 (ref 5–15)
BUN: 46 mg/dL — ABNORMAL HIGH (ref 8–23)
CO2: 24 mmol/L (ref 22–32)
Calcium: 8.7 mg/dL — ABNORMAL LOW (ref 8.9–10.3)
Chloride: 124 mmol/L — ABNORMAL HIGH (ref 98–111)
Creatinine, Ser: 1.21 mg/dL — ABNORMAL HIGH (ref 0.44–1.00)
GFR calc Af Amer: 48 mL/min — ABNORMAL LOW (ref >60)
GFR, EST NON AFRICAN AMERICAN: 41 mL/min — AB (ref >60)
GLUCOSE: 175 mg/dL — AB (ref 70–99)
Potassium: 2.9 mmol/L — ABNORMAL LOW (ref 3.5–5.1)
Sodium: 159 mmol/L — ABNORMAL HIGH (ref 135–145)

## 2018-03-10 LAB — CBC
HCT: 43.5 % (ref 36.0–46.0)
Hemoglobin: 12.9 g/dL (ref 12.0–15.0)
MCH: 27.4 pg (ref 26.0–34.0)
MCHC: 29.7 g/dL — ABNORMAL LOW (ref 30.0–36.0)
MCV: 92.4 fL (ref 80.0–100.0)
Platelets: 155 10*3/uL (ref 150–400)
RBC: 4.71 MIL/uL (ref 3.87–5.11)
RDW: 16.3 % — ABNORMAL HIGH (ref 11.5–15.5)
WBC: 13.3 10*3/uL — ABNORMAL HIGH (ref 4.0–10.5)
nRBC: 0 % (ref 0.0–0.2)

## 2018-03-10 LAB — URINE CULTURE: Culture: NO GROWTH

## 2018-03-10 LAB — POTASSIUM: Potassium: 3.3 mmol/L — ABNORMAL LOW (ref 3.5–5.1)

## 2018-03-10 LAB — GLUCOSE, CAPILLARY
GLUCOSE-CAPILLARY: 213 mg/dL — AB (ref 70–99)
Glucose-Capillary: 121 mg/dL — ABNORMAL HIGH (ref 70–99)
Glucose-Capillary: 114 mg/dL — ABNORMAL HIGH (ref 70–99)
Glucose-Capillary: 147 mg/dL — ABNORMAL HIGH (ref 70–99)

## 2018-03-10 LAB — TROPONIN I: Troponin I: 0.05 ng/mL (ref <0.03)

## 2018-03-10 LAB — PROCALCITONIN: Procalcitonin: 0.24 ng/mL

## 2018-03-10 MED ORDER — POTASSIUM CHLORIDE 10 MEQ/100ML IV SOLN
10.0000 meq | INTRAVENOUS | Status: AC
  Administered 2018-03-10 (×5): 10 meq via INTRAVENOUS
  Filled 2018-03-10 (×3): qty 100

## 2018-03-10 MED ORDER — POTASSIUM CHLORIDE 10 MEQ/100ML IV SOLN
10.0000 meq | INTRAVENOUS | Status: AC
  Administered 2018-03-10 (×2): 10 meq via INTRAVENOUS
  Filled 2018-03-10 (×2): qty 100

## 2018-03-10 MED ORDER — VANCOMYCIN HCL 500 MG IV SOLR
500.0000 mg | INTRAVENOUS | Status: DC
  Administered 2018-03-11: 01:00:00 500 mg via INTRAVENOUS
  Filled 2018-03-10: qty 500

## 2018-03-10 NOTE — Progress Notes (Signed)
Pharmacy Electrolyte Monitoring Consult:  Pharmacy consulted to assist in monitoring and replacing electrolytes in this 84 y.o. female admitted on 03/09/2018 with Shortness of Breath   Labs:  Sodium (mmol/L)  Date Value  03/10/2018 152 (H)   Potassium (mmol/L)  Date Value  03/10/2018 2.9 (L)   Magnesium (mg/dL)  Date Value  03/09/2018 2.1   Calcium (mg/dL)  Date Value  03/10/2018 8.7 (L)   Albumin (g/dL)  Date Value  03/09/2018 3.1 (L)    Assessment: 12/19 K 3.2 - 2 runs KCl 12/20 K 2.9  - got 5 of 6 runs of KCL Mag level 12/19 - 2.1  Plan: Recheck K level at 1800  BMET also already ordered for AM  Pharmacy will continue to follow  Rocky Morel 03/10/2018 4:03 PM

## 2018-03-10 NOTE — Consult Note (Signed)
Consultation Note Date: 03/10/2018   Patient Name: Jill Copeland  DOB: 1931/06/15  MRN: [id number]  Age / Sex: 84 y.o., female  PCP: Murlean Iba, MD Referring Physician: Saundra Shelling, MD  Reason for Consultation: Establishing goals of care  HPI/Patient Profile: 84 y.o. female admitted on 03/09/2018 from [facility] with shortness of breath. She has a past medical history significant for these, Alzheimer's disease, dysphasia, esophageal stricture, GERD, recurrent aspiration pneumonia, hiatal hernia, hypothyroidism, schizophrenia, CVA, recurrent UTIs.  She presented to the ED with cough and decreased oxygen saturations in the 70s.  She was initiated on 4 L of oxygen via nasal cannula later requiring high flow oxygen at 10 L.  Patient unable to provide medical history due to dementia.  X-ray showed large hiatal hernia, left pleural effusion, and left lower lobe airspace opacities.  This admission patient continues on IV vancomycin and cefepime post cultures.  Was seen by speech therapy with recommendations to continue with dysphagia diet with known esophageal stricture.  The medicine team consulted for goals of care.  Clinical Assessment and Goals of Care: I have reviewed medical records including lab results, imaging, Epic notes, and MAR, received report from the bedside RN, and assessed the patient. I spoke with patient's daughter/POA Colen Darling  to discuss diagnosis prognosis, GOC, EOL wishes, disposition and options.  Patient is awake.  She is unable to follow commands and remains confused due to history of dementia.  Patient unable to engage in goals of care discussion.  I re-introduced Palliative Medicine as specialized medical care for people living with serious illness. It focuses on providing relief from the symptoms and stress of a serious illness. The goal is to improve quality of life for both the  patient and the family.  Daughter verbalized patient being under hospice services years ago and discharged due to ability to regain strength and get better.   Her daughter lives in [place]. and reports she often speaks to her mother over the phone. She does report it has been some time since she has seen her. She states last week her mother was able to hold a lucid conversation over the phone and staff members reported she was at baseline with intermittent confusion and good appetite. She reports she knows she has mobility issues but feels that her mother's quality of life has been reasonable given her current state and awareness of condition.   We discussed her current illness and what it means in the larger context of her on-going co-morbidities.  Natural disease trajectory and expectations at EOL were discussed. Daughter verbalized her mother has dealt with dysphagia, hiatal hernia, and recurrent pneumonia and UTIs for sometime. She expresses that she remains hopeful she will improve and return to baseline as every time she has been told her mother is declining and she always seems to bounce back. I attempted to discuss her dementia process and the continuous familiar cycle the daughter speaks of. She verbalizes understanding and appreciation.   I attempted to elicit values and goals  of care important to the patient.    The difference between aggressive medical intervention and comfort care was considered in light of the patient's goals of care. Daughter states at this time she would like to continue treating the treatable with aggressive measures until she is able to get to see patient and see her current condition before making any decisions on her care.   I discussed patient's full current code status with daughter. Daughter again states her mother's wishes have always to do everything for her medically. I discussed her current condition and co-morbidities and what a potential code and quality of life  after an emergency event would look like for her. Daughter verbalized understanding and again expressed given she has not seen her in some time she would want to see her physical condition.   Hospice and Palliative Care services outpatient were explained and offered. Daughter expressed she would make a decision once she is present.   Questions and concerns were addressed. The family was encouraged to call with questions or concerns.  PMT will continue to support holistically.  Primary Decision Maker:  Engineer, site (daughter)     SUMMARY OF RECOMMENDATIONS    Full code-as confirmed by daughter/POA (daughter expresses she is not willing to make a medical decision until she arrives and can visit with her mother and assess her current state of health face to face)  Continue to treat the treatable  Advised daughter recommendations would be for hospice given current illness and co-morbidities. Daughter expressed she is open to idea, however she must visit with her mother first as she has not seen her in some time since she [place] out of state. Advised she would discussed with weekend providers and provide her decisions then.   Palliative Medicine team will continue to support as needed. We are not available again until Monday morning. Will follow at that time if patient remains hospitalized. Family aware also.   Code Status/Advance Care Planning:  Full code   Palliative Prophylaxis:   Aspiration, Bowel Regimen, Delirium Protocol, Frequent Pain Assessment, Oral Care and Turn Reposition  Additional Recommendations (Limitations, Scope, Preferences):  Full Scope Treatment  Psycho-social/Spiritual:   Desire for further Chaplaincy support: No  Additional Recommendations: Education on Hospice  Prognosis:   Guarded to poor in the setting of acute respiratory failure requiring oxygen supplement, protein calorie malnutrition, high risk for aspiration, advanced Alzheimer's, poor p.o.  intake, deconditioning, dysphagia causing reoccurring aspiration pneumonia, recurrent UTIs, CVA, and schizophrenia.  Discharge Planning: To Be Determined-expressed that she needed to visit with her mother and determine what the best level of care would be for her.  Recommendation is for family to consider hospice services at facility or hospice home.     Primary Diagnoses: Present on Admission: . Acute respiratory failure ([place])   I have reviewed the medical record, interviewed the patient and family, and examined the patient. The following aspects are pertinent.  Past Medical History:  Diagnosis Date  . Alzheimer disease ([place])   . Arthritis   . Bacteremia    a. 09/2016 - admit w/ S hominis bacteremia w/ ? of pacer lead vegetation;  b. 09/2016 TEE:  unable to fully pass TEE probe due to esoph stricture; c. IV Abx per ID.  . Diabetes mellitus without complication ([place])   . Dysphagia causing pulmonary aspiration with swallowing   . Esophageal stricture    a. @ site of anastamosis from hiatal hernia surgery.  Marland Kitchen GERD (gastroesophageal reflux disease)   .  Hiatal hernia    a. s/p prior repair with fistula;  b. 09/2016 CT chest: moderate to large hiatal hernia - stable.  . Hypothyroidism   . Pneumococcal pneumonia ([place])   . Recurrent pneumonia   . Recurrent UTI   . Schizo affective schizophrenia ([place])   . Stroke (cerebrum) ([place])   . Symptomatic bradycardia    a. s/p biotronik PPM 02/2005 at [facility] in [place], [place]  . Syncope    a. 02/2005 s/p Biotronik DC PPM @ [facility] in [place], [place].   Social History   Socioeconomic History  . Marital status: Widowed    Spouse name: Not on file  . Number of children: Not on file  . Years of education: Not on file  . Highest education level: Not on file  Occupational History  . Not on file  Social Needs  . Financial resource strain: Not on file  . Food insecurity:    Worry: Not on file    Inability: Not on file  .  Transportation needs:    Medical: Not on file    Non-medical: Not on file  Tobacco Use  . Smoking status: Never Smoker  . Smokeless tobacco: Never Used  Substance and Sexual Activity  . Alcohol use: No    Alcohol/week: 0.0 standard drinks  . Drug use: No  . Sexual activity: Not on file  Lifestyle  . Physical activity:    Days per week: Not on file    Minutes per session: Not on file  . Stress: Not on file  Relationships  . Social connections:    Talks on phone: Not on file    Gets together: Not on file    Attends religious service: Not on file    Active member of club or organization: Not on file    Attends meetings of clubs or organizations: Not on file    Relationship status: Not on file  Other Topics Concern  . Not on file  Social History Narrative  . Not on file   Family History  Problem Relation Age of Onset  . Thyroid cancer Grandchild   . Thyroid nodules Daughter   . Thyroid cancer Daughter   . Graves' disease Daughter   . Dementia Mother   . Schizophrenia Maternal Aunt   . Thyroid disease Son   . Cancer Son   . Cancer Brother   . Cancer Brother    Scheduled Meds: . budesonide (PULMICORT) nebulizer solution  0.25 mg Nebulization BID  . Chlorhexidine Gluconate Cloth  6 each Topical Q0600  . enoxaparin (LOVENOX) injection  30 mg Subcutaneous Q24H  . insulin aspart  0-9 Units Subcutaneous TID WC  . ipratropium-albuterol  3 mL Nebulization Q6H  . levothyroxine  75 mcg Intravenous Daily  . mupirocin ointment  1 application Nasal BID   Continuous Infusions: . azithromycin 500 mg (03/10/18 0951)  . ceFEPime (MAXIPIME) IV 2 g (03/10/18 1055)  . dextrose 100 mL/hr at 03/10/18 0202  . valproate sodium 250 mg (03/10/18 0617)  . [START ON 03/11/2018] vancomycin     PRN Meds:.acetaminophen **OR** acetaminophen, ondansetron **OR** ondansetron (ZOFRAN) IV Medications Prior to Admission:  Prior to Admission medications   Medication Sig Start Date End Date Taking?  Authorizing Provider  acetaminophen (ARTHRITIS PAIN RELIEF) 650 MG CR tablet Take 1 tablet by mouth every 6 (six) hours as needed.   Yes [provider]  albuterol (PROVENTIL HFA;VENTOLIN HFA) 108 (90 Base) MCG/ACT inhaler Inhale 2 puffs into the  lungs every 6 (six) hours as needed for wheezing or shortness of breath. 03/16/17  Yes Harvest Dark, MD  Amino Acids-Protein Hydrolys (FEEDING SUPPLEMENT, PRO-STAT SUGAR FREE 64,) LIQD Take 30 mLs by mouth 3 (three) times daily with meals.   Yes [provider]  ascorbic acid (VITAMIN C) 500 MG tablet Take 250 mg by mouth 2 (two) times daily.   Yes [provider]  Cholecalciferol 25 MCG (1000 UT) tablet Take 1,000 Units by mouth daily.    Yes [provider]  divalproex (DEPAKOTE ER) 250 MG 24 hr tablet Take 1 tablet (250 mg total) by mouth daily. Patient taking differently: Take 500 mg by mouth 2 (two) times daily.  03/16/17  Yes Harvest Dark, MD  docusate sodium (COLACE) 100 MG capsule Take 100 mg by mouth 2 (two) times daily.   Yes [provider]  ferrous sulfate 325 (65 FE) MG tablet Take 325 mg by mouth daily with breakfast.   Yes [provider]  fluPHENAZine (PROLIXIN) 5 MG tablet Take 7.5 mg by mouth 2 (two) times daily.    Yes [provider]  fluticasone (FLONASE) 50 MCG/ACT nasal spray Place 2 sprays into both nostrils daily. 03/16/17  Yes Harvest Dark, MD  Fluticasone-Salmeterol (ADVAIR DISKUS) 250-50 MCG/DOSE AEPB Inhale 1 puff into the lungs 2 (two) times daily. 03/16/17  Yes Harvest Dark, MD  furosemide (LASIX) 20 MG tablet Take 1 tablet (20 mg total) by mouth every other day. 03/16/17  Yes Harvest Dark, MD  gabapentin (NEURONTIN) 100 MG capsule Take 2 capsules (200 mg total) by mouth 3 (three) times daily. 03/16/17  Yes Harvest Dark, MD  ipratropium-albuterol (DUONEB) 0.5-2.5 (3) MG/3ML SOLN Take 3 mLs by nebulization every 6 (six) hours as  needed.   Yes [provider]  levothyroxine (SYNTHROID, LEVOTHROID) 125 MCG tablet Take 1 tablet (125 mcg total) by mouth daily before breakfast. Patient taking differently: Take 150 mcg by mouth daily before breakfast.  03/16/17  Yes Harvest Dark, MD  loratadine (CLARITIN) 10 MG tablet Take 10 mg by mouth daily.   Yes [provider]  LORazepam (ATIVAN) 0.5 MG tablet Take 0.25 mg by mouth daily.   Yes [provider]  Melatonin 3 MG TABS Take 3 mg by mouth at bedtime.   Yes [provider]  memantine (NAMENDA) 5 MG tablet Take 1 tablet (5 mg total) by mouth 2 (two) times daily. 03/16/17  Yes Harvest Dark, MD  metoprolol tartrate (LOPRESSOR) 25 MG tablet Take 0.5 tablets (12.5 mg total) by mouth 2 (two) times daily. 03/16/17  Yes Harvest Dark, MD  Multiple Vitamin (MULTIVITAMIN WITH MINERALS) TABS tablet Take 1 tablet by mouth daily.   Yes [provider]  ondansetron (ZOFRAN) 8 MG tablet Take 1 tablet (8 mg total) by mouth every 8 (eight) hours as needed for nausea or vomiting. 03/11/17  Yes Kathrine Haddock, NP  potassium chloride (K-DUR,KLOR-CON) 10 MEQ tablet Take 1 tablet (10 mEq total) by mouth every other day. 03/16/17  Yes Harvest Dark, MD  saccharomyces boulardii (FLORASTOR) 250 MG capsule Take 1 capsule (250 mg total) by mouth 2 (two) times daily. 01/01/15  Yes Hosie Poisson, MD  senna (SENOKOT) 8.6 MG TABS tablet Take 1 tablet by mouth 2 (two) times a week. Mondays and Fridays   Yes [provider]  sertraline (ZOLOFT) 50 MG tablet Take 1 tablet (50 mg total) by mouth at bedtime. Patient taking differently: Take 100 mg by mouth daily.  03/16/17  Yes Harvest Dark, MD  sodium chloride 1 G tablet Take 1 g by mouth 3 (three) times daily.   Yes [provider]  traMADol (ULTRAM) 50 MG tablet Take 50 mg by mouth every 4 (four) hours as needed.   Yes [provider]  azithromycin (ZITHROMAX) 500  MG tablet Take 1 tablet (500 mg total) by mouth daily. For 3 days Patient not taking: Reported on 03/09/2018 04/11/17   Fritzi Mandes, MD  insulin aspart (NOVOLOG) 100 UNIT/ML injection Inject 0-9 Units into the skin 3 (three) times daily with meals. Patient not taking: Reported on 03/09/2018 04/11/17   Fritzi Mandes, MD  LORazepam (ATIVAN) 0.5 MG tablet Take 1 tablet (0.5 mg total) by mouth 2 (two) times daily as needed for anxiety. Patient not taking: Reported on 04/08/2017 03/16/17   Harvest Dark, MD  nitrofurantoin (MACRODANTIN) 100 MG capsule Take 100 mg by mouth 2 (two) times daily.    [provider]  OLANZapine (ZYPREXA) 10 MG tablet Take 0.5 tablets (5 mg total) by mouth 2 (two) times daily. Patient not taking: Reported on 03/09/2018 03/16/17   Harvest Dark, MD   Allergies  Allergen Reactions  . Penicillins Other (See Comments)    Has patient had a PCN reaction causing immediate rash, facial/tongue/throat swelling, SOB or lightheadedness with hypotension: Unknown Has patient had a PCN reaction causing severe rash involving mucus membranes or skin necrosis: Unknown Has patient had a PCN reaction that required hospitalization: Unknown Has patient had a PCN reaction occurring within the last 10 years: Unknown If all of the above answers are "NO", then may proceed with Cephalosporin use.    Review of Systems  Unable to perform ROS: Dementia    Physical Exam Vitals signs and nursing note reviewed.  Constitutional:      Appearance: She is cachectic.     Comments: Thin, frail, chronically ill appearing   Cardiovascular:     Rate and Rhythm: Normal rate and regular rhythm.     Pulses: Normal pulses.     Heart sounds: Normal heart sounds.  Pulmonary:     Effort: Pulmonary effort is normal.     Breath sounds: Decreased breath sounds and wheezing present.  Skin:    General: Skin is warm and dry.     Findings: Bruising present.  Neurological:     Mental Status: She  is alert. She is confused.     Motor: Weakness and atrophy present.     Comments: Hx of Alzheimer's   Psychiatric:        Behavior: Behavior is uncooperative.        Cognition and Memory: Cognition is impaired. Memory is impaired.        Judgment: Judgment is inappropriate.     Comments: Unable to follow commands      Vital Signs: BP (!) 133/48 (BP Location: Left Arm)   Pulse 70   Temp 97.9 F (36.6 C) (Oral)   Resp 16   Ht 5\' 5"  (1.651 m)   Wt 40.1 kg   SpO2 93%   BMI 14.71 kg/m  Pain Scale: 0-10   Pain Score: 0-No pain   SpO2: SpO2: 93 % O2 Device:SpO2: 93 % O2 Flow Rate: .O2 Flow Rate (L/min): 3 L/min  IO: Intake/output summary:   Intake/Output Summary (Last 24 hours) at 03/10/2018 1255 Last data filed at 03/10/2018 1129 Gross per 24 hour  Intake 1627.77 ml  Output 800 ml  Net 827.77 ml    LBM: Last BM  Date: (last BM [place]) Baseline Weight: Weight: 40.1 kg Most recent weight: Weight: 40.1 kg     Palliative Assessment/Data: PPS 20%   Time In: 0930 Time Out: 1035 Time Total: 65 min  Greater than 50%  of this time was spent counseling and coordinating care related to the above assessment and plan.  Signed by:  Alda Lea, AGPCNP-BC Palliative Medicine Team  Phone: [phone] Fax: [phone] Pager: [phone] Amion: Bjorn Pippin    Please contact Palliative Medicine Team phone at [phone] for questions and concerns.  For individual provider: See Shea Evans

## 2018-03-10 NOTE — NC FL2 (Signed)
[place] LEVEL OF CARE SCREENING TOOL     IDENTIFICATION  Patient Name: Jill Copeland Birthdate: [birth year] Sex: female Admission Date (Current Location): 03/09/2018  [facility] and [place] Number:  Engineering geologist and Address:  [facility], [address] [place], [place] [address]      Provider Number: [id number]  Attending Physician Name and Address:  Saundra Shelling, MD  Relative Name and Phone Number:       Current Level of Care: Hospital Recommended Level of Care: [place] Prior Approval Number:    Date Approved/Denied:   PASRR Number:    Discharge Plan: SNF    Current Diagnoses: Patient Active Problem List   Diagnosis Date Noted  . Acute respiratory failure ([place]) 03/09/2018  . Pressure injury of skin 03/09/2018  . Sepsis ([place]) 04/08/2017  . A-fib ([place]) 04/08/2017  . Asthma 03/11/2017  . Pain management 03/11/2017  . Advanced care planning/counseling discussion 11/19/2016  . Impaired mobility and ADLs 11/19/2016  . Adventitious breath sounds 11/19/2016  . Pacemaker infection ([place]) 10/11/2016  . Endocarditis 10/09/2016  . Schizoaffective disorder ([place])   . Pressure ulcer 01/13/2015  . Multiple falls 01/12/2015  . Overactive bladder 12/30/2014  . Alzheimer disease ([place])   . Schizo affective schizophrenia ([place])   . Recurrent pneumonia   . Dysphagia causing pulmonary aspiration with swallowing   . Pneumococcal pneumonia ([place])   . Arrhythmia   . Esophageal stricture   . Hypothyroidism   . Lung nodule   . Dementia in chronic schizophrenia ([place]) 08/12/2014  . Social discord 08/12/2014  . Family conflict 40/12/2723    Orientation RESPIRATION BLADDER Height & Weight     Self  O2(3 liters ) Incontinent Weight: 88 lb 6.4 oz (40.1 kg) Height:  5\' 5"  (165.1 cm)  BEHAVIORAL SYMPTOMS/MOOD NEUROLOGICAL BOWEL NUTRITION STATUS  (none) (none) Incontinent Diet(Pureed with nectar thick liquid )   AMBULATORY STATUS COMMUNICATION OF NEEDS Skin   Extensive Assist Verbally Other (Comment)(Stage 2 on coccyx)                       Personal Care Assistance Level of Assistance  Bathing, Feeding, Dressing Bathing Assistance: Limited assistance Feeding assistance: Limited assistance Dressing Assistance: Limited assistance     Functional Limitations Info  Sight, Hearing, Speech Sight Info: Adequate Hearing Info: Adequate Speech Info: Adequate    SPECIAL CARE FACTORS FREQUENCY                       Contractures Contractures Info: Not present    Additional Factors Info  Code Status, Allergies Code Status Info: Full Code  Allergies Info: Penicillins            Current Medications (03/10/2018):  This is the current hospital active medication list Current Facility-Administered Medications  Medication Dose Route Frequency Provider Last Rate Last Dose  . acetaminophen (TYLENOL) tablet 650 mg  650 mg Oral Q6H PRN Dustin Flock, MD       Or  . acetaminophen (TYLENOL) suppository 650 mg  650 mg Rectal Q6H PRN Dustin Flock, MD      . azithromycin (ZITHROMAX) 500 mg in sodium chloride 0.9 % 250 mL IVPB  500 mg Intravenous Q24H Lavonia Drafts, MD 250 mL/hr at 03/10/18 0951 500 mg at 03/10/18 0951  . budesonide (PULMICORT) nebulizer solution 0.25 mg  0.25 mg Nebulization BID Dustin Flock, MD   0.25 mg at 03/10/18 0749  .  ceFEPIme (MAXIPIME) 2 g in sodium chloride 0.9 % 100 mL IVPB  2 g Intravenous Q24H Colin Broach A, RPH 200 mL/hr at 03/10/18 1055 2 g at 03/10/18 1055  . Chlorhexidine Gluconate Cloth 2 % PADS 6 each  6 each Topical Q0600 Dustin Flock, MD   6 each at 03/10/18 0604  . dextrose 5 % solution   Intravenous Continuous Dustin Flock, MD 100 mL/hr at 03/10/18 0202    . enoxaparin (LOVENOX) injection 30 mg  30 mg Subcutaneous Q24H Colin Broach A, RPH   30 mg at 03/09/18 2212  . insulin aspart (novoLOG) injection 0-9 Units  0-9 Units  Subcutaneous TID WC Dustin Flock, MD   3 Units at 03/10/18 0818  . ipratropium-albuterol (DUONEB) 0.5-2.5 (3) MG/3ML nebulizer solution 3 mL  3 mL Nebulization Q6H Dustin Flock, MD   3 mL at 03/10/18 0749  . levothyroxine (SYNTHROID, LEVOTHROID) injection 75 mcg  75 mcg Intravenous Daily Dustin Flock, MD   75 mcg at 03/10/18 0609  . mupirocin ointment (BACTROBAN) 2 % 1 application  1 application Nasal BID Dustin Flock, MD   1 application at 45/99/77 [id number]  . ondansetron (ZOFRAN) tablet 4 mg  4 mg Oral Q6H PRN Dustin Flock, MD       Or  . ondansetron [facility]) injection 4 mg  4 mg Intravenous Q6H PRN Dustin Flock, MD      . valproate (DEPACON) 250 mg in dextrose 5 % 50 mL IVPB  250 mg Intravenous Q6H Dustin Flock, MD 52.5 mL/hr at 03/10/18 0617 250 mg at 03/10/18 0617  . [START ON 03/11/2018] vancomycin (VANCOCIN) 500 mg in sodium chloride 0.9 % 100 mL IVPB  500 mg Intravenous Q36H Rocky Morel, [facility]         Discharge Medications: Please see discharge summary for a list of discharge medications.  Relevant Imaging Results:  Relevant Lab Results:   Additional Information    Ankith Edmonston  Louretta Shorten, LCSWA

## 2018-03-10 NOTE — Evaluation (Addendum)
Clinical/Bedside Swallow Evaluation Patient Details  Name: Jill Copeland MRN: [id number] Date of Birth: Jun 24, 1931  Today's Date: 03/10/2018 Time: SLP Start Time (ACUTE ONLY): 0930 SLP Stop Time (ACUTE ONLY): 1030 SLP Time Calculation (min) (ACUTE ONLY): 60 min  Past Medical History:  Past Medical History:  Diagnosis Date  . Alzheimer disease ([place])   . Arthritis   . Bacteremia    a. 09/2016 - admit w/ S hominis bacteremia w/ ? of pacer lead vegetation;  b. 09/2016 TEE:  unable to fully pass TEE probe due to esoph stricture; c. IV Abx per ID.  . Diabetes mellitus without complication ([place])   . Dysphagia causing pulmonary aspiration with swallowing   . Esophageal stricture    a. @ site of anastamosis from hiatal hernia surgery.  Marland Kitchen GERD (gastroesophageal reflux disease)   . Hiatal hernia    a. s/p prior repair with fistula;  b. 09/2016 CT chest: moderate to large hiatal hernia - stable.  . Hypothyroidism   . Pneumococcal pneumonia ([place])   . Recurrent pneumonia   . Recurrent UTI   . Schizo affective schizophrenia ([place])   . Stroke (cerebrum) ([place])   . Symptomatic bradycardia    a. s/p biotronik PPM 02/2005 at [facility] in [place], [place]  . Syncope    a. 02/2005 s/p Biotronik DC PPM @ [facility] in [place], [place].   Past Surgical History:  Past Surgical History:  Procedure Laterality Date  . HERNIA REPAIR     hiatal  . JEJUNOSTOMY FEEDING TUBE    . PACEMAKER INSERTION    . TEE WITHOUT CARDIOVERSION N/A 10/11/2016   Procedure: TRANSESOPHAGEAL ECHOCARDIOGRAM (TEE);  Surgeon: Wellington Hampshire, MD;  Location: [facility] ORS;  Service: Cardiovascular;  Laterality: N/A;   HPI:  Per previous H&P, pt is a 84 y/o female w/ PMH + for schizoaffective disorder w/ schizophrenia requiring multiple visits to the emergency department, inpatient hospitalizations, Alzheimer's dementia, diabetes mellitus type 2 without complication, hiatal hernia status post several unsuccessful corrective  surgeries, dysphagia with recurrent aspiration pneumonia and previously had a jejunostomy tube which has subsequently been removed, esophageal stricture status post multiple dilations, GERD, arrhythmia with associated syncope status post pacemaker insertion, stroke, recurrent pneumonia with bacteremia, hypothyroidism who presented with 2 week history of cough and fall.  She is from SNF and has had progressive decline over last few years. Pt does have Psychiatric dxs w/ behavior issues at times requiring ED visits. Pt does have the chronic ESOPHAGEAL PHASE Dysphagia history of 2-3+ years. Pt has been exeperiencing N/V after minimal oral intake per report, and pt's report. ANY Esophageal phase dysphagia w/ Regurgitation can increase risk for the aspiration of REFLUX/vomit material thus negatively impacting Pulmonary status. At this admission, pt was sent from [facility] with complaint of cough oxygen saturations in the 70s. Patient had to be placed on 4 L of oxygen there. In ED, she required high flow oxygen at 10 L. Currently, this morning, she is on 3 liters of O2 support; NPO. Congested cough noted. Pt is admitted w/ Severe hypernatremia related to free water deficit I will place patient on D5 W; noted BMI of 14.71 per chart. CXR w/ Left pleural effusion, pneumonia.   Assessment / Plan / Recommendation Clinical Impression  Pt appears at her baseline as per H&P and previous assessment. Pt does have a baseline of Dementia as well as Esophageal dysmotility. Pt exhibited oropharyngeal phase swallowing deficits w/ trials of thin liquids, ice chips - suspect impact  from Cognitive decline, Dementia. Pt consumed po trials of thin liquids via Cup w/ immediate and delayed coughing during 2/2 trials. Suspect delayed pharyngeal swallow initiation. When pt was given trials of Nectar liquids via Cup/Straw and purees, No immediate, overt s/s of aspiration were noted; no decline in respiratory status or change in vocal  quality assessed immediately post trials. Oral phase appeared more wfl for organized bolus management and oral clearing. She exhibited a more timely A-P transfer and even stated "it's gone" when ready for another bolus. OM exam appeared grossly [facility] w/ no unilateral weakness noted. Pt helped to feed self w/ support. In addition to oropharyngeal phase dysphagia and risk for aspiration, issues of the ESOPHAGEAL PHASE can/will impact the oropharyngeal phases of swallowing and can increase risk for aspiration of regurgitated food/liquid material. Recommend f/u w/ GI for any further assessment of the ESOPHAGUS d/t pt's extensive h/o GI issues. Recommend a Dysphagia level 1 (PUREE) w/ NECTAR liquids diet w/ any further education on aspiration and REFLUX precautions w/ staff at [facility], family post discharge - education on the impact of Dementia AND Esophageal phase dysmotility on swallowing function and the risks for pulmonary decline from both prandial and RELFUX/REGURGITATION aspiration. Pt will benefit from the modified diet at discharge and Pills CRUSHED in puree for easier oropharyngeal and Esophageal phase clearing. MD and NSG updated; Palliative Care consult in place for [place]. GI consult as appropriate for further assessment and education.  SLP Visit Diagnosis: Dysphagia, oropharyngeal phase (R13.12)(baseline Dementia)    Aspiration Risk  Moderate aspiration risk;Risk for inadequate nutrition/hydration    Diet Recommendation   Dysphagia level 1 (PUREE) w/ NECTAR liquids; aspiration precautions; RELUX precautions. Pills in Puree - Crushed as needed/able to. Feeding assistance w/ ALL meals.  Medication Administration: Crushed with puree    Other  Recommendations Recommended Consults: (Dietician f/u; Palliative Care f/u for [place]) Oral Care Recommendations: Oral care BID;Staff/trained caregiver to provide oral care Other Recommendations: Order thickener from pharmacy;Prohibited food (jello, ice cream, thin  soups);Remove water pitcher;Have oral suction available   Follow up Recommendations None(possible need for education w/ staff, family at NH post d/c per pt's status)      Frequency and Duration (n/a)  (n/a)       Prognosis Prognosis for Safe Diet Advancement: Fair Barriers to Reach Goals: Cognitive deficits;Time post onset;Severity of deficits(baseline GI issues; dysphagia)      Swallow Study   General Date of Onset: 03/09/18 HPI: Per previous H&P, pt is a 84 y/o female w/ PMH + for schizoaffective disorder w/ schizophrenia requiring multiple visits to the emergency department, inpatient hospitalizations, Alzheimer's dementia, diabetes mellitus type 2 without complication, hiatal hernia status post several unsuccessful corrective surgeries, dysphagia with recurrent aspiration pneumonia and previously had a jejunostomy tube which has subsequently been removed, esophageal stricture status post multiple dilations, GERD, arrhythmia with associated syncope status post pacemaker insertion, stroke, recurrent pneumonia with bacteremia, hypothyroidism who presented with 2 week history of cough and fall.  She is from SNF and has had progressive decline over last few years. Pt does have Psychiatric dxs w/ behavior issues at times requiring ED visits. Pt does have the chronic ESOPHAGEAL PHASE Dysphagia history of 2-3+ years. Pt has been exeperiencing N/V after minimal oral intake per report, and pt's report. ANY Esophageal phase dysphagia w/ Regurgitation can increase risk for the aspiration of REFLUX/vomit material thus negatively impacting Pulmonary status. At this admission, pt was sent from [facility] with complaint of cough oxygen saturations in  the 70s. Patient had to be placed on 4 L of oxygen there. In ED, she required high flow oxygen at 10 L. Currently, this morning, she is on 3 liters of O2 support; NPO. Congested cough noted. Pt is admitted w/ Severe hypernatremia related to free water deficit I  will place patient on D5 W; noted BMI of 14.71 per chart. CXR w/ Left pleural effusion, pneumonia. Type of Study: Bedside Swallow Evaluation Previous Swallow Assessment: 2018; 2016 Diet Prior to this Study: NPO(recommended a dysphagia diet previously) Temperature Spikes Noted: No(at admission; WBC 13.3 reducing from admission) Respiratory Status: Nasal cannula(3 liters) History of Recent Intubation: No Behavior/Cognition: Alert;Cooperative;Pleasant mood;Confused;Distractible;Requires cueing(baseline Dementia) Oral Cavity Assessment: Within Functional Limits Oral Care Completed by SLP: Yes Oral Cavity - Dentition: Edentulous Vision: Functional for self-feeding Self-Feeding Abilities: Able to feed self;Needs assist;Needs set up;Total assist(shaky) Patient Positioning: Upright in bed(needed positioning) Baseline Vocal Quality: Low vocal intensity(moderately; muttered speech) Volitional Cough: Congested Volitional Swallow: Able to elicit(w/ time)    Oral/Motor/Sensory Function Overall Oral Motor/Sensory Function: Within functional limits(appeared adequate)   Ice Chips Ice chips: Impaired Presentation: Spoon(fed; 2 trials) Oral Phase Impairments: Reduced lingual movement/coordination(min) Oral Phase Functional Implications: Prolonged oral transit Pharyngeal Phase Impairments: Suspected delayed Swallow;Cough - Immediate(congested)   Thin Liquid Thin Liquid: Impaired Presentation: Cup;Self Fed(assisted; 2 trials) Oral Phase Impairments: Reduced lingual movement/coordination(min) Oral Phase Functional Implications: (suspect reduced bolus control) Pharyngeal  Phase Impairments: Suspected delayed Swallow;Cough - Delayed;Cough - Immediate(w/ each)    Nectar Thick Nectar Thick Liquid: Within functional limits Presentation: Spoon;Straw;Self Fed(assisted; ~3 ozs total) Other Comments: much more oral control of bolus trials w/ more immediate swallowing   Honey Thick Honey Thick Liquid: Not tested    Puree Puree: Within functional limits Presentation: Spoon(fed; 10 trials) Other Comments: good bolus control w/ trials   Solid     Solid: Not tested Other Comments: edentulous       Orinda Kenner, MS, CCC-SLP Watson,Katherine 03/10/2018,11:12 AM

## 2018-03-10 NOTE — Clinical Social Work Note (Signed)
Clinical Social Work Assessment  Patient Details  Name: Jill Copeland MRN: [id number] Date of Birth: [birth year]  Date of referral:  03/10/18               Reason for consult:  Facility Placement                Permission sought to share information with:  Case Manager, Customer service manager, Family Supports Permission granted to share information::  Yes, Verbal Permission Granted  Name::        Agency::     Relationship::     Contact Information:     Housing/Transportation Living arrangements for the past 2 months:  [place] of Information:  Facility Patient Interpreter Needed:  None Criminal Activity/Legal Involvement Pertinent to Current Situation/Hospitalization:  No - Comment as needed Significant Relationships:  Adult Children Lives with:  Facility Resident Do you feel safe going back to the place where you live?  Yes Need for family participation in patient care:  Yes (Comment)  Care giving concerns:  Patient is a long term resident at [facility].    Social Worker assessment / plan:  CSW consulted for facility placement. CSW met with patient to discuss discharge plan. Patient is confused and unable to complete assessment. CSW contacted patient's daughter Jill Copeland [phone] but she is unavailable and her voicemail is full. CSW attempted to contact patient's other daughter Jill Copeland [phone] but she is also unavailable. CSW left voicemail for daughter to return call. CSW spoke with Otila Kluver at [facility] who said that she is a long term resident and can return when medically stable. CSW will continue to follow for discharge planning.   Employment status:  Retired Nurse, adult PT Recommendations:  [place] / Referral to community resources:  [place]  Patient/Family's Response to care:  Patient thanked CSW   Patient/Family's Understanding of and Emotional  Response to Diagnosis, Current Treatment, and Prognosis:  Family unreachable at this time   Emotional Assessment Appearance:  Appears stated age Attitude/Demeanor/Rapport:  Guarded Affect (typically observed):  Calm, Withdrawn Orientation:  Oriented to Self Alcohol / Substance use:  Not Applicable Psych involvement (Current and /or in the community):  No (Comment)  Discharge Needs  Concerns to be addressed:  Discharge Planning Concerns Readmission within the last 30 days:  No Current discharge risk:  None Barriers to Discharge:  Continued Medical Work up   [employer], [place] 03/10/2018, 1:55 PM

## 2018-03-10 NOTE — Progress Notes (Signed)
[facility] [place] NAME: Jill Copeland    MR#:  [id number]  DATE OF BIRTH:  [birth year]  SUBJECTIVE:  CHIEF COMPLAINT:   Chief Complaint  Patient presents with  . Shortness of Breath  Patient seen and evaluated today Has shortness of breath Has cough and phlegm Has weakness On oxygen via nasal cannula  REVIEW OF SYSTEMS:    ROS  CONSTITUTIONAL: No documented fever. No fatigue, weakness. No weight gain, no weight loss.  EYES: No blurry or double vision.  ENT: No tinnitus. No postnasal drip. No redness of the oropharynx.  RESPIRATORY: Has cough, no wheeze, no hemoptysis. Has dyspnea.  CARDIOVASCULAR: No chest pain. No orthopnea. No palpitations. No syncope.  GASTROINTESTINAL: No nausea, no vomiting or diarrhea. No abdominal pain. No melena or hematochezia.  GENITOURINARY: No dysuria or hematuria.  ENDOCRINE: No polyuria or nocturia. No heat or cold intolerance.  HEMATOLOGY: No anemia. No bruising. No bleeding.  INTEGUMENTARY: No rashes. No lesions.  MUSCULOSKELETAL: No arthritis. No swelling. No gout.  NEUROLOGIC: No numbness, tingling, or ataxia. No seizure-type activity.  PSYCHIATRIC: No anxiety. No insomnia. No ADD.   DRUG ALLERGIES:   Allergies  Allergen Reactions  . Penicillins Other (See Comments)    Has patient had a PCN reaction causing immediate rash, facial/tongue/throat swelling, SOB or lightheadedness with hypotension: Unknown Has patient had a PCN reaction causing severe rash involving mucus membranes or skin necrosis: Unknown Has patient had a PCN reaction that required hospitalization: Unknown Has patient had a PCN reaction occurring within the last 10 years: Unknown If all of the above answers are "NO", then may proceed with Cephalosporin use.     VITALS:  Blood pressure (!) 127/48, pulse (!) 59, temperature 98.2 F (36.8 C), temperature source Oral, resp. rate 20, height 5\' 5"  (1.651 m), weight 40.1 kg, SpO2 (!)  89 %.  PHYSICAL EXAMINATION:   Physical Exam  GENERAL:  84 y.o.-year-old patient lying in the bed with no acute distress.  EYES: Pupils equal, round, reactive to light and accommodation. No scleral icterus. Extraocular muscles intact.  HEENT: Head atraumatic, normocephalic. Oropharynx and nasopharynx clear.  NECK:  Supple, no jugular venous distention. No thyroid enlargement, no tenderness.  LUNGS: Decreased breath sounds bilaterally, bilateral Rales heard. No use of accessory muscles of respiration.  CARDIOVASCULAR: S1, S2 normal. No murmurs, rubs, or gallops.  ABDOMEN: Soft, nontender, nondistended. Bowel sounds present. No organomegaly or mass.  EXTREMITIES: No cyanosis, clubbing or edema b/l.    NEUROLOGIC: Cranial nerves II through XII are intact. No focal Motor or sensory deficits b/l.   PSYCHIATRIC: The patient is alert and oriented x 2.  SKIN: No obvious rash, lesion, or ulcer.   LABORATORY PANEL:   CBC Recent Labs  Lab 03/10/18 0106  WBC 13.3*  HGB 12.9  HCT 43.5  PLT 155   ------------------------------------------------------------------------------------------------------------------ Chemistries  Recent Labs  Lab 03/09/18 1029  03/09/18 1940 03/10/18 0106 03/10/18 0657  NA 162*   < > 162* 159*  158* 156*  K 3.2*  --   --  2.9*  --   CL 123*  --   --  124*  --   CO2 27  --   --  24  --   GLUCOSE 153*  --   --  175*  --   BUN 61*  --   --  46*  --   CREATININE 1.38*  --   --  1.21*  --  CALCIUM 9.5  --   --  8.7*  --   MG  --   --  2.1  --   --   AST 34  --   --   --   --   ALT 24  --   --   --   --   ALKPHOS 113  --   --   --   --   BILITOT 0.6  --   --   --   --    < > = values in this interval not displayed.   ------------------------------------------------------------------------------------------------------------------  Cardiac Enzymes Recent Labs  Lab 03/10/18 0106  TROPONINI 0.05*    ------------------------------------------------------------------------------------------------------------------  RADIOLOGY:  Dg Chest Port 1 View  Result Date: 03/09/2018 CLINICAL DATA:  Shortness of Breath EXAM: PORTABLE CHEST 1 VIEW COMPARISON:  04/08/2017 FINDINGS: Large hiatal hernia. Airspace disease in the left mid and lower lung with probable left effusion. Right lung clear. Heart is normal size. Left-sided pacer with leads in the right atrium and right ventricle. IMPRESSION: Large hiatal hernia. Left effusion with left lower lobe airspace opacity concerning for pneumonia. Electronically Signed   By: Rolm Baptise M.D.   On: 03/09/2018 11:19     ASSESSMENT AND PLAN:   84 year old elderly female patient with history of Alzheimer's dementia type 2 diabetes mellitus, esophageal stricture, dysphagia, GERD, hypothyroidism, recurrent pneumonia Under hospitalist service for shortness of breath  -Healthcare associated pneumonia Continue IV vancomycin and IV cefepime antibiotics Follow-up cultures  -History of esophageal stricture and dysphagia Speech therapy to do swallow study and recommend diet  -Dementia Supportive care  -Hyponatremia and dehydration IV hydration with D5W Monitor sodium levels closely  -Hypothyroidism Synthroid supplementation intravenously  All the records are reviewed and case discussed with Care Management/Social Worker. Management plans discussed with the patient, family and they are in agreement.  CODE STATUS: Full code  DVT Prophylaxis: SCDs  TOTAL TIME TAKING CARE OF THIS PATIENT: 35 minutes.   POSSIBLE D/C IN 2 to 3 DAYS, DEPENDING ON CLINICAL CONDITION.  Saundra Shelling M.D on 03/10/2018 at 1:45 PM  Between 7am to 6pm - Pager - [phone]  After 6pm go to [email or web address] - password [email or web address] [place]  Office  [phone]  CC: Primary care physician; Murlean Iba, MD  Note: This dictation was prepared with  Dragon dictation along with smaller phrase technology. Any transcriptional errors that result from this process are unintentional.

## 2018-03-10 NOTE — Progress Notes (Signed)
Pharmacy Antibiotic Note  Jill Copeland is a 84 y.o. female admitted on 03/09/2018 with pneumonia.  Pharmacy has been consulted for cefepime and vancomycin dosing.  Plan: Vancomycin 1000 mg IV once followed by vancomycin 500 mg every 36 hours.  Goal trough 15-20 mcg/mL.  SCr today improving, will order dose and monitor renal function closely; Scr in AM. Will draw vanc level on day 4 of therapy per policy and to ensure pt is clearing doses (not yet Css).     ke - 0.023 T1/2 - 30.1 VD - 28.1   Continue Cefepime 2 gm IV every 24 hours  Height: 5\' 5"  (165.1 cm) Weight: 88 lb 6.4 oz (40.1 kg) IBW/kg (Calculated) : 57  Temp (24hrs), Avg:98.6 F (37 C), Min:97.9 F (36.6 C), Max:100.5 F (38.1 C)  Recent Labs  Lab 03/09/18 1029 03/09/18 1045 03/09/18 1216 03/10/18 0106  WBC 13.5*  --   --  13.3*  CREATININE 1.38*  --   --  1.21*  LATICACIDVEN  --  3.10* 2.5*  --     Estimated Creatinine Clearance: 21.9 mL/min (A) (by C-G formula based on SCr of 1.21 mg/dL (H)).    Allergies  Allergen Reactions  . Penicillins Other (See Comments)    Has patient had a PCN reaction causing immediate rash, facial/tongue/throat swelling, SOB or lightheadedness with hypotension: Unknown Has patient had a PCN reaction causing severe rash involving mucus membranes or skin necrosis: Unknown Has patient had a PCN reaction that required hospitalization: Unknown Has patient had a PCN reaction occurring within the last 10 years: Unknown If all of the above answers are "NO", then may proceed with Cephalosporin use.     Antimicrobials this admission: Cefepime 12/19 >>  Vanco 12/19 >>  Azithromycin 12/19 >>  Dose adjustments this admission:   Microbiology results: 12/19 BCx: NGTD 12/19 UCx: pending   Sputum:   12/19 MRSA PCR: +  Thank you for allowing pharmacy to be a part of this patient's care.  Rocky Morel, PharmD, BCPS Clinical Pharmacist 03/10/2018 8:16 AM

## 2018-03-10 NOTE — Progress Notes (Signed)
Advanced care plan.  Purpose of the Encounter: CODE STATUS  Parties in Attendance: Patient  Patient's Decision Capacity: Okay  Subjective/Patient's story: Presented to the emergency room for shortness of breath   Objective/Medical story Elderly patient with history of diabetes mellitus, esophageal stricture, GERD, hypothyroidism, [facility] for shortness of breath Patient has recurrent pneumonia needs IV antibiotics  Goals of care determination:  Advance care directives goals of care were discussed with the help of palliative care service team.  For now patient and family wanted full resuscitation   CODE STATUS: Full code   Time spent discussing advanced care planning: 16 minutes

## 2018-03-10 NOTE — Progress Notes (Signed)
Pharmacy Electrolyte Monitoring Consult:  Pharmacy consulted to assist in monitoring and replacing electrolytes in this 84 y.o. female admitted on 03/09/2018 with Shortness of Breath   Labs:  Sodium (mmol/L)  Date Value  03/10/2018 152 (H)   Potassium (mmol/L)  Date Value  03/10/2018 3.3 (L)   Magnesium (mg/dL)  Date Value  03/09/2018 2.1   Calcium (mg/dL)  Date Value  03/10/2018 8.7 (L)   Albumin (g/dL)  Date Value  03/09/2018 3.1 (L)    Assessment: 12/19 K 3.2 - 2 runs KCl 12/20 K 2.9  - got 5 of 6 runs of KCL, follow-up level 3.3 Mag level 12/19 - 2.1  Plan: 2 additional runs on IV KCl BMET ordered for AM  Pharmacy will continue to follow  Dallie Piles, PharmD 03/10/2018 6:43 PM

## 2018-03-10 NOTE — Consult Note (Signed)
Date: 03/10/2018                  Patient Name:  Jill Copeland  MRN: [id number]  DOB: Nov 28, 1931  Age / Sex: 84 y.o., female         PCP: Murlean Iba, MD                 Service Requesting Consult: IM/ Saundra Shelling, MD                 Reason for Consult: Hypernatremia            History of Present Illness: Patient is a 84 y.o. Caucasian female with medical problems of Alzheimer's, diabetes, dysphagia, GERD, hypothyroidism, schizophrenia, history of stroke, who was admitted to [facility] on 03/09/2018 for evaluation of hypernatremia.  Patient is not able to provide any meaningful information.  All information is obtained from the chart Patient was [facility] for cough and decreased saturations.  She required oxygen supplementation.  During work-up she was found to have severe hyponatremia.  She was admitted for further evaluation and management   Medications: Outpatient medications: Medications Prior to Admission  Medication Sig Dispense Refill Last Dose  . acetaminophen (ARTHRITIS PAIN RELIEF) 650 MG CR tablet Take 1 tablet by mouth every 6 (six) hours as needed.   prn at prn  . albuterol (PROVENTIL HFA;VENTOLIN HFA) 108 (90 Base) MCG/ACT inhaler Inhale 2 puffs into the lungs every 6 (six) hours as needed for wheezing or shortness of breath. 1 Inhaler 0 prn at prn  . Amino Acids-Protein Hydrolys (FEEDING SUPPLEMENT, PRO-STAT SUGAR FREE 64,) LIQD Take 30 mLs by mouth 3 (three) times daily with meals.   03/09/2018 at 0900  . ascorbic acid (VITAMIN C) 500 MG tablet Take 250 mg by mouth 2 (two) times daily.   03/09/2018 at 0900  . Cholecalciferol 25 MCG (1000 UT) tablet Take 1,000 Units by mouth daily.    03/09/2018 at 0900  . divalproex (DEPAKOTE ER) 250 MG 24 hr tablet Take 1 tablet (250 mg total) by mouth daily. (Patient taking differently: Take 500 mg by mouth 2 (two) times daily. ) 30 tablet 0 03/09/2018 at 0900  . docusate sodium (COLACE) 100 MG  capsule Take 100 mg by mouth 2 (two) times daily.   03/09/2018 at 0900  . ferrous sulfate 325 (65 FE) MG tablet Take 325 mg by mouth daily with breakfast.   03/09/2018 at 0900  . fluPHENAZine (PROLIXIN) 5 MG tablet Take 7.5 mg by mouth 2 (two) times daily.    03/09/2018 at 0900  . fluticasone (FLONASE) 50 MCG/ACT nasal spray Place 2 sprays into both nostrils daily. 16 g 0 03/09/2018 at 0900  . Fluticasone-Salmeterol (ADVAIR DISKUS) 250-50 MCG/DOSE AEPB Inhale 1 puff into the lungs 2 (two) times daily. 60 each 0 03/09/2018 at 0900  . furosemide (LASIX) 20 MG tablet Take 1 tablet (20 mg total) by mouth every other day. 30 tablet 0 03/09/2018 at 0900  . gabapentin (NEURONTIN) 100 MG capsule Take 2 capsules (200 mg total) by mouth 3 (three) times daily. 90 capsule 0 03/09/2018 at 0900  . ipratropium-albuterol (DUONEB) 0.5-2.5 (3) MG/3ML SOLN Take 3 mLs by nebulization every 6 (six) hours as needed.   prn at prn  . levothyroxine (SYNTHROID, LEVOTHROID) 125 MCG tablet Take 1 tablet (125 mcg total) by mouth daily before breakfast. (Patient taking differently: Take 150 mcg by mouth daily before breakfast. ) 30 tablet 0  03/09/2018 at 0600  . loratadine (CLARITIN) 10 MG tablet Take 10 mg by mouth daily.   03/09/2018 at 0900  . LORazepam (ATIVAN) 0.5 MG tablet Take 0.25 mg by mouth daily.   03/09/2018 at 0600  . Melatonin 3 MG TABS Take 3 mg by mouth at bedtime.   03/08/2018 at 2000  . memantine (NAMENDA) 5 MG tablet Take 1 tablet (5 mg total) by mouth 2 (two) times daily. 60 tablet 0 03/09/2018 at 0900  . metoprolol tartrate (LOPRESSOR) 25 MG tablet Take 0.5 tablets (12.5 mg total) by mouth 2 (two) times daily. 60 tablet 0 03/09/2018 at 0900  . Multiple Vitamin (MULTIVITAMIN WITH MINERALS) TABS tablet Take 1 tablet by mouth daily.   03/09/2018 at 0900  . ondansetron (ZOFRAN) 8 MG tablet Take 1 tablet (8 mg total) by mouth every 8 (eight) hours as needed for nausea or vomiting. 90 tablet 0 prn at prn  .  potassium chloride (K-DUR,KLOR-CON) 10 MEQ tablet Take 1 tablet (10 mEq total) by mouth every other day. 30 tablet 0 03/08/2018 at 1700  . saccharomyces boulardii (FLORASTOR) 250 MG capsule Take 1 capsule (250 mg total) by mouth 2 (two) times daily. 30 capsule 0 03/09/2018 at 0900  . senna (SENOKOT) 8.6 MG TABS tablet Take 1 tablet by mouth 2 (two) times a week. Mondays and Fridays   03/06/2018 at 0900  . sertraline (ZOLOFT) 50 MG tablet Take 1 tablet (50 mg total) by mouth at bedtime. (Patient taking differently: Take 100 mg by mouth daily. ) 30 tablet 0 03/09/2018 at 0900  . sodium chloride 1 G tablet Take 1 g by mouth 3 (three) times daily.   03/09/2018 at 0900  . traMADol (ULTRAM) 50 MG tablet Take 50 mg by mouth every 4 (four) hours as needed.   prn at prn  . azithromycin (ZITHROMAX) 500 MG tablet Take 1 tablet (500 mg total) by mouth daily. For 3 days (Patient not taking: Reported on 03/09/2018) 3 tablet 0 Not Taking at Unknown time  . insulin aspart (NOVOLOG) 100 UNIT/ML injection Inject 0-9 Units into the skin 3 (three) times daily with meals. (Patient not taking: Reported on 03/09/2018) 10 mL 11 Not Taking at Unknown time  . LORazepam (ATIVAN) 0.5 MG tablet Take 1 tablet (0.5 mg total) by mouth 2 (two) times daily as needed for anxiety. (Patient not taking: Reported on 04/08/2017) 10 tablet 0 Not Taking at Unknown time  . nitrofurantoin (MACRODANTIN) 100 MG capsule Take 100 mg by mouth 2 (two) times daily.   Not Taking at Unknown time  . OLANZapine (ZYPREXA) 10 MG tablet Take 0.5 tablets (5 mg total) by mouth 2 (two) times daily. (Patient not taking: Reported on 03/09/2018) 60 tablet 0 Not Taking at Unknown time    Current medications: Current Facility-Administered Medications  Medication Dose Route Frequency Provider Last Rate Last Dose  . acetaminophen (TYLENOL) tablet 650 mg  650 mg Oral Q6H PRN Dustin Flock, MD       Or  . acetaminophen (TYLENOL) suppository 650 mg  650 mg Rectal Q6H  PRN Dustin Flock, MD      . azithromycin (ZITHROMAX) 500 mg in sodium chloride 0.9 % 250 mL IVPB  500 mg Intravenous Q24H Lavonia Drafts, MD 250 mL/hr at 03/10/18 0951 500 mg at 03/10/18 0951  . budesonide (PULMICORT) nebulizer solution 0.25 mg  0.25 mg Nebulization BID Dustin Flock, MD   0.25 mg at 03/10/18 0749  . ceFEPIme (MAXIPIME) 2 g in sodium chloride  0.9 % 100 mL IVPB  2 g Intravenous Q24H Colin Broach A, RPH 200 mL/hr at 03/10/18 1055 2 g at 03/10/18 1055  . Chlorhexidine Gluconate Cloth 2 % PADS 6 each  6 each Topical Q0600 Dustin Flock, MD   6 each at 03/10/18 0604  . dextrose 5 % solution   Intravenous Continuous Dustin Flock, MD 100 mL/hr at 03/10/18 0202    . enoxaparin (LOVENOX) injection 30 mg  30 mg Subcutaneous Q24H Colin Broach A, RPH   30 mg at 03/09/18 2212  . insulin aspart (novoLOG) injection 0-9 Units  0-9 Units Subcutaneous TID WC Dustin Flock, MD   3 Units at 03/10/18 0818  . ipratropium-albuterol (DUONEB) 0.5-2.5 (3) MG/3ML nebulizer solution 3 mL  3 mL Nebulization Q6H Dustin Flock, MD   3 mL at 03/10/18 1401  . levothyroxine (SYNTHROID, LEVOTHROID) injection 75 mcg  75 mcg Intravenous Daily Dustin Flock, MD   75 mcg at 03/10/18 0609  . mupirocin ointment (BACTROBAN) 2 % 1 application  1 application Nasal BID Dustin Flock, MD   1 application at 74/94/49 [phone]  . ondansetron (ZOFRAN) tablet 4 mg  4 mg Oral Q6H PRN Dustin Flock, MD       Or  . ondansetron (ZOFRAN) injection 4 mg  4 mg Intravenous Q6H PRN Dustin Flock, MD      . valproate (DEPACON) 250 mg in dextrose 5 % 50 mL IVPB  250 mg Intravenous Q6H Dustin Flock, MD 52.5 mL/hr at 03/10/18 1340 250 mg at 03/10/18 1340  . [START ON 03/11/2018] vancomycin (VANCOCIN) 500 mg in sodium chloride 0.9 % 100 mL IVPB  500 mg Intravenous Q36H Rocky Morel, RPH          Allergies: Allergies  Allergen Reactions  . Penicillins Other (See Comments)    Has patient had a PCN  reaction causing immediate rash, facial/tongue/throat swelling, SOB or lightheadedness with hypotension: Unknown Has patient had a PCN reaction causing severe rash involving mucus membranes or skin necrosis: Unknown Has patient had a PCN reaction that required hospitalization: Unknown Has patient had a PCN reaction occurring within the last 10 years: Unknown If all of the above answers are "NO", then may proceed with Cephalosporin use.       Past Medical History: Past Medical History:  Diagnosis Date  . Alzheimer disease ([place])   . Arthritis   . Bacteremia    a. 09/2016 - admit w/ S hominis bacteremia w/ ? of pacer lead vegetation;  b. 09/2016 TEE:  unable to fully pass TEE probe due to esoph stricture; c. IV Abx per ID.  . Diabetes mellitus without complication ([place])   . Dysphagia causing pulmonary aspiration with swallowing   . Esophageal stricture    a. @ site of anastamosis from hiatal hernia surgery.  Marland Kitchen GERD (gastroesophageal reflux disease)   . Hiatal hernia    a. s/p prior repair with fistula;  b. 09/2016 CT chest: moderate to large hiatal hernia - stable.  . Hypothyroidism   . Pneumococcal pneumonia ([place])   . Recurrent pneumonia   . Recurrent UTI   . Schizo affective schizophrenia ([place])   . Stroke (cerebrum) ([place])   . Symptomatic bradycardia    a. s/p biotronik PPM 02/2005 at [facility] [place] in [place], [place]  . Syncope    a. 02/2005 s/p Biotronik DC PPM @ [facility] in [place], [place].     Past Surgical History: Past Surgical History:  Procedure Laterality Date  . HERNIA REPAIR  hiatal  . JEJUNOSTOMY FEEDING TUBE    . PACEMAKER INSERTION    . TEE WITHOUT CARDIOVERSION N/A 10/11/2016   Procedure: TRANSESOPHAGEAL ECHOCARDIOGRAM (TEE);  Surgeon: Wellington Hampshire, MD;  Location: [facility] ORS;  Service: Cardiovascular;  Laterality: N/A;     Family History: Family History  Problem Relation Age of Onset  . Thyroid cancer Grandchild   . Thyroid nodules Daughter    . Thyroid cancer Daughter   . Graves' disease Daughter   . Dementia Mother   . Schizophrenia Maternal Aunt   . Thyroid disease Son   . Cancer Son   . Cancer Brother   . Cancer Brother      Social History: Social History   Socioeconomic History  . Marital status: Widowed    Spouse name: Not on file  . Number of children: Not on file  . Years of education: Not on file  . Highest education level: Not on file  Occupational History  . Not on file  Social Needs  . Financial resource strain: Not on file  . Food insecurity:    Worry: Not on file    Inability: Not on file  . Transportation needs:    Medical: Not on file    Non-medical: Not on file  Tobacco Use  . Smoking status: Never Smoker  . Smokeless tobacco: Never Used  Substance and Sexual Activity  . Alcohol use: No    Alcohol/week: 0.0 standard drinks  . Drug use: No  . Sexual activity: Not on file  Lifestyle  . Physical activity:    Days per week: Not on file    Minutes per session: Not on file  . Stress: Not on file  Relationships  . Social connections:    Talks on phone: Not on file    Gets together: Not on file    Attends religious service: Not on file    Active member of club or organization: Not on file    Attends meetings of clubs or organizations: Not on file    Relationship status: Not on file  . Intimate partner violence:    Fear of current or ex partner: Not on file    Emotionally abused: Not on file    Physically abused: Not on file    Forced sexual activity: Not on file  Other Topics Concern  . Not on file  Social History Narrative  . Not on file     Review of Systems: Not available Gen:  HEENT:  CV:  Resp:  GI: GU :  MS:  Derm:   Psych: Heme:  Neuro:  Endocrine  Vital Signs: Blood pressure (!) 127/48, pulse (!) 59, temperature 98.2 F (36.8 C), temperature source Oral, resp. rate 20, height 5\' 5"  (1.651 m), weight 40.1 kg, SpO2 (!) 89 %.   Intake/Output Summary (Last 24  hours) at 03/10/2018 1529 Last data filed at 03/10/2018 1129 Gross per 24 hour  Intake 1627.77 ml  Output 800 ml  Net 827.77 ml    Weight trends: [employer]   03/09/18 1027  Weight: 40.1 kg    Physical Exam: General:  Frail, elderly woman, laying in the bed  HEENT  moist oral mucous membranes  Neck:  Supple  Lungs:  Normal breathing effort  Heart::  Irregular rhythm, no rub  Abdomen:  Soft  Extremities:  No peripheral edema  Neurologic:  Alert, mumbles, able to follow basic simple commands  Skin:  Decreased turgor    Lab results: Basic Metabolic  Panel: Recent Labs  Lab 03/09/18 1029  03/09/18 1940 03/10/18 0106 03/10/18 0657 03/10/18 1247  NA 162*   < > 162* 159*  158* 156* 152*  K 3.2*  --   --  2.9*  --   --   CL 123*  --   --  124*  --   --   CO2 27  --   --  24  --   --   GLUCOSE 153*  --   --  175*  --   --   BUN 61*  --   --  46*  --   --   CREATININE 1.38*  --   --  1.21*  --   --   CALCIUM 9.5  --   --  8.7*  --   --   MG  --   --  2.1  --   --   --    < > = values in this interval not displayed.    Liver Function Tests: Recent Labs  Lab 03/09/18 1029  AST 34  ALT 24  ALKPHOS 113  BILITOT 0.6  PROT 8.9*  ALBUMIN 3.1*   No results for input(s): LIPASE, AMYLASE in the last 168 hours. No results for input(s): AMMONIA in the last 168 hours.  CBC: Recent Labs  Lab 03/09/18 1029 03/10/18 0106  WBC 13.5* 13.3*  NEUTROABS 10.2*  --   HGB 15.0 12.9  HCT 49.7* 43.5  MCV 91.4 92.4  PLT 170 155    Cardiac Enzymes: Recent Labs  Lab 03/10/18 0106  TROPONINI 0.05*    BNP: Invalid input(s): POCBNP  CBG: Recent Labs  Lab 03/09/18 1035 03/09/18 1636 03/09/18 2053 03/10/18 0736 03/10/18 1138  GLUCAP 238* 245* 88 213* 121*    Microbiology: Recent Results (from the past 720 hour(s))  Blood Culture (routine x 2)     Status: None (Preliminary result)   Collection Time: 03/09/18 10:29 AM  Result Value Ref Range Status   Specimen  Description BLOOD RIGHT AC  Final   Special Requests   Final    BOTTLES DRAWN AEROBIC AND ANAEROBIC Blood Culture results may not be optimal due to an inadequate volume of blood received in culture bottles   Culture   Final    NO GROWTH < 24 HOURS Performed at [facility], [address] [place], [place] [address]    Report Status PENDING  Incomplete  Blood Culture (routine x 2)     Status: None (Preliminary result)   Collection Time: 03/09/18 10:29 AM  Result Value Ref Range Status   Specimen Description BLOOD LEFT WRIST  Final   Special Requests   Final    BOTTLES DRAWN AEROBIC AND ANAEROBIC Blood Culture results may not be optimal due to an inadequate volume of blood received in culture bottles   Culture   Final    NO GROWTH < 24 HOURS Performed at [facility], [address], [place], [place] [address]    Report Status PENDING  Incomplete  Urine culture     Status: None   Collection Time: 03/09/18 10:29 AM  Result Value Ref Range Status   Specimen Description   Final    URINE, RANDOM Performed at [facility], [address] [place], [place] [address]    Special Requests   Final    NONE Performed at [facility], [address], [place], [place] [address]    Culture   Final  NO GROWTH Performed at [facility], [place] [address], [place], [place] [address]    Report Status 03/10/2018 FINAL  Final  MRSA PCR Screening     Status: Abnormal   Collection Time: 03/09/18  2:50 PM  Result Value Ref Range Status   MRSA by PCR POSITIVE (A) NEGATIVE Final    Comment:        The GeneXpert MRSA Assay (FDA approved for NASAL specimens only), is one component of a comprehensive MRSA colonization surveillance program. It is not intended to diagnose MRSA infection nor to guide or monitor treatment for MRSA infections. RESULT CALLED TO, READ BACK BY AND VERIFIED WITH: PHYLIS KING @1845  03/09/18 AKT Performed at [facility], [place] [place],  [address]      Coagulation Studies: No results for input(s): LABPROT, INR in the last 72 hours.  Urinalysis: Recent Labs    03/09/18 1029  COLORURINE YELLOW*  LABSPEC 1.025  PHURINE 5.0  GLUCOSEU NEGATIVE  HGBUR NEGATIVE  BILIRUBINUR NEGATIVE  KETONESUR 5*  PROTEINUR NEGATIVE  NITRITE NEGATIVE  LEUKOCYTESUR NEGATIVE        Imaging: Dg Chest Port 1 View  Result Date: 03/09/2018 CLINICAL DATA:  Shortness of Breath EXAM: PORTABLE CHEST 1 VIEW COMPARISON:  04/08/2017 FINDINGS: Large hiatal hernia. Airspace disease in the left mid and lower lung with probable left effusion. Right lung clear. Heart is normal size. Left-sided pacer with leads in the right atrium and right ventricle. IMPRESSION: Large hiatal hernia. Left effusion with left lower lobe airspace opacity concerning for pneumonia. Electronically Signed   By: Rolm Baptise M.D.   On: 03/09/2018 11:19      Assessment & Plan: Patient is a 84 y.o. Caucasian female with medical problems of Alzheimer's, diabetes, dysphagia, GERD, hypothyroidism, schizophrenia, history of stroke, who was admitted to [facility] on 03/09/2018 for evaluation of hypernatremia.   1.  Hypernatremia Results for TRELLIS, GUIRGUIS [facility] (MRN [id number]) as of 03/10/2018 16:31  04/09/2017 04:56 03/09/2018 10:29 03/09/2018 14:10 03/09/2018 19:40 03/10/2018 01:06 03/10/2018 01:06 03/10/2018 06:57 03/10/2018 12:47  Sodium 139 162 (HH) 162 (HH) 162 (HH) 158 (H) 159 (H) 156 (H) 152 (H)   Sodium levels are improving with current management.  Currently patient is getting D5W at 100 cc/h Rate of correction seems to be slightly faster than desired We will decrease D5W to 50 cc/h We will continue to follow closely      LOS: [place] 12/20/20193:29 PM  [place], [place]  Note: This note was prepared with Dragon dictation. Any transcription errors are unintentional

## 2018-03-11 DIAGNOSIS — I471 Supraventricular tachycardia: Secondary | ICD-10-CM

## 2018-03-11 DIAGNOSIS — R0602 Shortness of breath: Secondary | ICD-10-CM

## 2018-03-11 LAB — BASIC METABOLIC PANEL
ANION GAP: 6 (ref 5–15)
BUN: 32 mg/dL — ABNORMAL HIGH (ref 8–23)
CO2: 24 mmol/L (ref 22–32)
Calcium: 8.4 mg/dL — ABNORMAL LOW (ref 8.9–10.3)
Chloride: 118 mmol/L — ABNORMAL HIGH (ref 98–111)
Creatinine, Ser: 0.9 mg/dL (ref 0.44–1.00)
GFR calc Af Amer: >60 mL/min (ref >60)
GFR calc non Af Amer: 59 mL/min — ABNORMAL LOW (ref >60)
Glucose, Bld: 132 mg/dL — ABNORMAL HIGH (ref 70–99)
Potassium: 3.1 mmol/L — ABNORMAL LOW (ref 3.5–5.1)
Sodium: 148 mmol/L — ABNORMAL HIGH (ref 135–145)

## 2018-03-11 LAB — CBC
HCT: 34.2 % — ABNORMAL LOW (ref 36.0–46.0)
Hemoglobin: 10.6 g/dL — ABNORMAL LOW (ref 12.0–15.0)
MCH: 27.6 pg (ref 26.0–34.0)
MCHC: 31 g/dL (ref 30.0–36.0)
MCV: 89.1 fL (ref 80.0–100.0)
Platelets: 153 10*3/uL (ref 150–400)
RBC: 3.84 MIL/uL — ABNORMAL LOW (ref 3.87–5.11)
RDW: 16.5 % — ABNORMAL HIGH (ref 11.5–15.5)
WBC: 17.7 10*3/uL — AB (ref 4.0–10.5)
nRBC: 0.2 % (ref 0.0–0.2)

## 2018-03-11 LAB — GLUCOSE, CAPILLARY
Glucose-Capillary: 117 mg/dL — ABNORMAL HIGH (ref 70–99)
Glucose-Capillary: 143 mg/dL — ABNORMAL HIGH (ref 70–99)

## 2018-03-11 LAB — SODIUM
SODIUM: 147 mmol/L — AB (ref 135–145)
Sodium: 148 mmol/L — ABNORMAL HIGH (ref 135–145)
Sodium: 142 mmol/L (ref 135–145)

## 2018-03-11 LAB — POTASSIUM: Potassium: 3.2 mmol/L — ABNORMAL LOW (ref 3.5–5.1)

## 2018-03-11 LAB — PROCALCITONIN: Procalcitonin: 0.26 ng/mL

## 2018-03-11 MED ORDER — LEVOTHYROXINE SODIUM 50 MCG PO TABS
150.0000 ug | ORAL_TABLET | Freq: Every day | ORAL | Status: DC
  Administered 2018-03-11 – 2018-03-17 (×7): 150 ug via ORAL
  Filled 2018-03-11 (×7): qty 1

## 2018-03-11 MED ORDER — POTASSIUM CHLORIDE 10 MEQ/100ML IV SOLN
10.0000 meq | INTRAVENOUS | Status: AC
  Administered 2018-03-11: 11:00:00 10 meq via INTRAVENOUS
  Filled 2018-03-11: qty 100

## 2018-03-11 MED ORDER — DILTIAZEM HCL-DEXTROSE 100-5 MG/100ML-% IV SOLN (PREMIX)
5.0000 mg/h | INTRAVENOUS | Status: DC
  Filled 2018-03-11: qty 100

## 2018-03-11 MED ORDER — VANCOMYCIN HCL 500 MG IV SOLR
500.0000 mg | INTRAVENOUS | Status: DC
  Administered 2018-03-12 – 2018-03-13 (×2): 500 mg via INTRAVENOUS
  Filled 2018-03-11 (×2): qty 500

## 2018-03-11 MED ORDER — POTASSIUM CHLORIDE 10 MEQ/100ML IV SOLN
10.0000 meq | INTRAVENOUS | Status: AC
  Administered 2018-03-11 – 2018-03-12 (×4): 10 meq via INTRAVENOUS
  Filled 2018-03-11 (×4): qty 100

## 2018-03-11 MED ORDER — DILTIAZEM HCL 25 MG/5ML IV SOLN
10.0000 mg | Freq: Once | INTRAVENOUS | Status: AC
  Administered 2018-03-11: 10:00:00 10 mg via INTRAVENOUS
  Filled 2018-03-11: qty 5

## 2018-03-11 MED ORDER — DILTIAZEM HCL 30 MG PO TABS: 60.0000 mg | ORAL_TABLET | Freq: Three times a day (TID) | ORAL | Status: DC

## 2018-03-11 MED ORDER — HEPARIN SODIUM (PORCINE) 5000 UNIT/ML IJ SOLN
5000.0000 [IU] | Freq: Two times a day (BID) | INTRAMUSCULAR | Status: DC
  Administered 2018-03-11 – 2018-03-17 (×12): 5000 [IU] via SUBCUTANEOUS
  Filled 2018-03-11 (×12): qty 1

## 2018-03-11 MED ORDER — MAGNESIUM SULFATE 2 GM/50ML IV SOLN
2.0000 g | Freq: Once | INTRAVENOUS | Status: AC
  Administered 2018-03-11: 10:00:00 2 g via INTRAVENOUS
  Filled 2018-03-11: qty 50

## 2018-03-11 MED ORDER — DILTIAZEM HCL 30 MG PO TABS
30.0000 mg | ORAL_TABLET | Freq: Three times a day (TID) | ORAL | Status: DC
  Administered 2018-03-11 – 2018-03-13 (×8): 30 mg via ORAL
  Filled 2018-03-11 (×8): qty 1

## 2018-03-11 NOTE — Progress Notes (Signed)
Pharmacy Electrolyte Monitoring Consult:  Pharmacy consulted to assist in monitoring and replacing electrolytes in this 84 y.o. female admitted on 03/09/2018 with Shortness of Breath   Labs:  Sodium (mmol/L)  Date Value  03/11/2018 148 (H)   Potassium (mmol/L)  Date Value  03/11/2018 3.1 (L)   Magnesium (mg/dL)  Date Value  03/09/2018 2.1   Calcium (mg/dL)  Date Value  03/11/2018 8.4 (L)   Albumin (g/dL)  Date Value  03/09/2018 3.1 (L)    Assessment: 12/19 K 3.2 - 2 runs KCl 12/20 K 2.9  - got 5 of 6 runs of KCL, follow-up level 3.3 Mag level 12/19 - 2.1  Plan: K 3.1   Will order  IV KCl 10 meq x 3 (30 meq total) K ordered for 1800 BMP ordered for AM   Pharmacy will continue to follow  [place], PharmD 03/11/2018 7:51 AM

## 2018-03-11 NOTE — Progress Notes (Signed)
Patient's HR sustaining 180's after cardizem 10mg  IV and 30mg  po given, notified Dr. Irving Burton, requested for to come assess the patient. Received verbal order to transfer patient to 2A for a cardizem drip.

## 2018-03-11 NOTE — Plan of Care (Signed)
  Problem: Activity: Goal: Ability to tolerate increased activity will improve Outcome: Progressing   Problem: Clinical Measurements: Goal: Ability to maintain a body temperature in the normal range will improve Outcome: Progressing   Problem: Respiratory: Goal: Ability to maintain adequate ventilation will improve Outcome: Progressing Goal: Ability to maintain a clear airway will improve Outcome: Progressing   Problem: Education: Goal: Knowledge of General Education information will improve Description Including pain rating scale, medication(s)/side effects and non-pharmacologic comfort measures Outcome: Progressing   Problem: Health Behavior/Discharge Planning: Goal: Ability to manage health-related needs will improve Outcome: Progressing   Problem: Clinical Measurements: Goal: Ability to maintain clinical measurements within normal limits will improve Outcome: Progressing Goal: Diagnostic test results will improve Outcome: Progressing   Problem: Nutrition: Goal: Adequate nutrition will be maintained Outcome: Progressing   Problem: Pain Managment: Goal: General experience of comfort will improve Outcome: Progressing   Problem: Safety: Goal: Ability to remain free from injury will improve Outcome: Progressing   Problem: Skin Integrity: Goal: Risk for impaired skin integrity will decrease Outcome: Progressing

## 2018-03-11 NOTE — Progress Notes (Signed)
She is persistently tachycardic, EKG showed sinus tachycardia, will transfer the patient to ER for Cardizem drip, call patient's daughter, Lattie Haw at [phone], voice box is full, unable to leave a message.  Recommend DNR status but patient condition has been discussed with her by palliative care team, she would rather want her mom full code so we will transfer the patient to telemetry for Cardizem drip.  Spoke with charge nurse Manuela Schwartz on 2A

## 2018-03-11 NOTE — Progress Notes (Signed)
Patient's HR sustaining in the 180's, notified Dr. Vianne Bulls, received verbal order for stat EKG, stated she would put in new orders. Patient cannot state how she feels due to baseline confusion, respirations at 32BPM. Continuing to monitor.

## 2018-03-11 NOTE — Progress Notes (Signed)
Consult was placed for a new peripheral iv;  Pt with extremely poor veins;  Multiple bruises and skin tears;  Able to place 24 ga iv on 2nd attempt;  Attempted once more to get another site but without success;  Pt on multiple meds, runs of K;  Suggest central access for this pt;   Raynelle Fanning RN IV Team

## 2018-03-11 NOTE — Progress Notes (Signed)
Pharmacy Electrolyte Monitoring Consult:  Pharmacy consulted to assist in monitoring and replacing electrolytes in this 84 y.o. female admitted on 03/09/2018 with Shortness of Breath   Labs:  Sodium (mmol/L)  Date Value  03/11/2018 142   Potassium (mmol/L)  Date Value  03/11/2018 3.2 (L)   Magnesium (mg/dL)  Date Value  03/09/2018 2.1   Calcium (mg/dL)  Date Value  03/11/2018 8.4 (L)   Albumin (g/dL)  Date Value  03/09/2018 3.1 (L)    Assessment: 12/19 K 3.2 - 2 runs KCl 12/20 K 2.9  - got 5 of 6 runs of KCL, follow-up level 3.3 Mag level 12/19 - 2.1  Plan: K 3.2 up from 3.1 after receiving KCl 20 mEq total IV Will order KCl 10 mEq every 1 hour for 4 dose. Will recheck BMP in AM.   Pharmacy will continue to follow  Forrest Moron, PharmD 03/11/2018 7:49 PM

## 2018-03-11 NOTE — Progress Notes (Signed)
Pharmacy Antibiotic Note  Jill Copeland is a 84 y.o. female admitted on 03/09/2018 with pneumonia.  Pharmacy has been consulted for cefepime and vancomycin dosing.  Plan: Vancomycin 1000 mg IV once followed by vancomycin 500 mg every 36 hours.  Goal trough 15-20 mcg/mL.  SCr today improving, will order dose and monitor renal function closely; Scr in AM. Will draw vanc level on day 4 of therapy per policy and to ensure pt is clearing doses (not yet Css).    ke - 0.023 T1/2 - 30.1 VD - 28.1  -Continue Cefepime 2 gm IV every 24 hours  12/21: Scr improved. Will adjust Vancomycin to 500mg  IV q24h. Ke 0.029  t 1/2 23.9  Vd 28.1  Check trough 12/23- not at steady state- assessing clearance    Height: 5\' 5"  (165.1 cm) Weight: 88 lb 6.4 oz (40.1 kg) IBW/kg (Calculated) : 57  Temp (24hrs), Avg:98.8 F (37.1 C), Min:98.2 F (36.8 C), Max:99.3 F (37.4 C)  Recent Labs  Lab 03/09/18 1029 03/09/18 1045 03/09/18 1216 03/10/18 0106 03/11/18 0105  WBC 13.5*  --   --  13.3* 17.7*  CREATININE 1.38*  --   --  1.21* 0.90  LATICACIDVEN  --  3.10* 2.5*  --   --     Estimated Creatinine Clearance: 29.5 mL/min (by C-G formula based on SCr of 0.9 mg/dL).    Allergies  Allergen Reactions  . Penicillins Other (See Comments)    Has patient had a PCN reaction causing immediate rash, facial/tongue/throat swelling, SOB or lightheadedness with hypotension: Unknown Has patient had a PCN reaction causing severe rash involving mucus membranes or skin necrosis: Unknown Has patient had a PCN reaction that required hospitalization: Unknown Has patient had a PCN reaction occurring within the last 10 years: Unknown If all of the above answers are "NO", then may proceed with Cephalosporin use.     Antimicrobials this admission: Cefepime 12/19 >>  Vanco 12/19 >>  Azithromycin 12/19 >>  Dose adjustments this admission:   Microbiology results: 12/19 BCx: NGTD 12/19 UCx: pending   Sputum:   12/19  MRSA PCR: +  Thank you for allowing pharmacy to be a part of this patient's care.  Noralee Space, PharmD, BCPS Clinical Pharmacist 03/11/2018 12:46 PM

## 2018-03-11 NOTE — Progress Notes (Signed)
[place] at [place] NAME: Jill Copeland    MR#:  [id number]  DATE OF BIRTH:  04-13-1931  SUBJECTIVE:  CHIEF COMPLAINT:   Chief Complaint  Patient presents with  . Shortness of Breath  Baseline dementia, has episodes of sinus tachycardia on monitor, patient unable to give any history because of baseline dementia but appears comfortable during my visit.  REVIEW OF SYSTEMS:    Review of Systems  Unable to perform ROS: Dementia  Unable to obtain review of systems because of her dementia.    DRUG ALLERGIES:   Allergies  Allergen Reactions  . Penicillins Other (See Comments)    Has patient had a PCN reaction causing immediate rash, facial/tongue/throat swelling, SOB or lightheadedness with hypotension: Unknown Has patient had a PCN reaction causing severe rash involving mucus membranes or skin necrosis: Unknown Has patient had a PCN reaction that required hospitalization: Unknown Has patient had a PCN reaction occurring within the last 10 years: Unknown If all of the above answers are "NO", then may proceed with Cephalosporin use.     VITALS:  Blood pressure (!) 130/54, pulse 92, temperature 99.3 F (37.4 C), temperature source Oral, resp. rate (!) 21, height 5\' 5"  (1.651 m), weight 40.1 kg, SpO2 95 %.  PHYSICAL EXAMINATION:   Physical Exam  GENERAL:  84 y.o.-year-old patient lying in the bed with no acute distress.  Patient appears cachectic.  Chronically ill appearing, thin, frail. EYES: Pupils equal, round, reactive to light . No scleral icterus. Extraocular muscles intact.  HEENT: Head atraumatic, normocephalic. Oropharynx and nasopharynx clear.  NECK:  Supple, no jugular venous distention. No thyroid enlargement, no tenderness.  LUNGS: Decreased breath sounds bilaterally,No use of accessory muscles of respiration.  CARDIOVASCULAR: S1, S2 tachycardic.Marland Kitchen No murmurs, rubs, or gallops.  ABDOMEN: Soft, nontender, nondistended. Bowel  sounds present. No organomegaly or mass.  EXTREMITIES: No cyanosis, clubbing or edema b/l.    NEUROLOGIC: No focal deficit observed but unable to follow directions for full neuro exam due to her baseline dementia. PSYCHIATRIC: The patient is alert and oriented x 2.  Admission impaired, memory impaired SKIN: No obvious rash, lesion, or ulcer.   LABORATORY PANEL:   CBC Recent Labs  Lab 03/11/18 0105  WBC 17.7*  HGB 10.6*  HCT 34.2*  PLT 153   ------------------------------------------------------------------------------------------------------------------ Chemistries  Recent Labs  Lab 03/09/18 1029  03/09/18 1940  03/11/18 0105 03/11/18 0642  NA 162*   < > 162*   < > 148* 148*  K 3.2*  --   --    < > 3.1*  --   CL 123*  --   --    < > 118*  --   CO2 27  --   --    < > 24  --   GLUCOSE 153*  --   --    < > 132*  --   BUN 61*  --   --    < > 32*  --   CREATININE 1.38*  --   --    < > 0.90  --   CALCIUM 9.5  --   --    < > 8.4*  --   MG  --   --  2.1  --   --   --   AST 34  --   --   --   --   --   ALT 24  --   --   --   --   --  ALKPHOS 113  --   --   --   --   --   BILITOT 0.6  --   --   --   --   --    < > = values in this interval not displayed.   ------------------------------------------------------------------------------------------------------------------  Cardiac Enzymes Recent Labs  Lab 03/10/18 0106  TROPONINI 0.05*   ------------------------------------------------------------------------------------------------------------------  RADIOLOGY:  Dg Chest Port 1 View  Result Date: 03/09/2018 CLINICAL DATA:  Shortness of Breath EXAM: PORTABLE CHEST 1 VIEW COMPARISON:  04/08/2017 FINDINGS: Large hiatal hernia. Airspace disease in the left mid and lower lung with probable left effusion. Right lung clear. Heart is normal size. Left-sided pacer with leads in the right atrium and right ventricle. IMPRESSION: Large hiatal hernia. Left effusion with left lower lobe  airspace opacity concerning for pneumonia. Electronically Signed   By: Rolm Baptise M.D.   On: 03/09/2018 11:19     ASSESSMENT AND PLAN:   84 year old elderly female patient with history of Alzheimer's dementia type 2 diabetes mellitus, esophageal stricture, dysphagia, GERD, hypothyroidism, recurrent pneumonia Under hospitalist service for shortness of breath  -Healthcare associated pneumonia Continue IV vancomycin and IV cefepime antibiotics Patient has elevated procalcitonin, leukocytosis, repeat chest x-ray today, continue oxygen, IV antibiotics/  -History of esophageal stricture and dysphagia Speech therapy has recommended pured diet with nectar liquids.  -Dementia Supportive care, seen by palliative care, call patient's daughter who is visiting the patient from out of state, wanted to her to be full code and according to her she wants to decide when she comes and sees her mom.  For now she is full code.  -Hypernatremia due to dehydration, improving with the D5 water, seen by nephrology, sodium is decreased from 159  To 148.   -Hypothyroidism Synthroid supplementation   Sinus tachycardia secondary to underlying respiratory issues with healthcare associated pneumonia, added Cardizem, continue to monitor on telemetry, obtain 12-lead EKG, likely due to underlying pneumonia and hypokalemia. Hypokalemia, replace potassium, magnesium,   Due to multiple medical problems of pneumonia, protein calorie malnutrition, advanced Alzheimer's dementia, dysphagia, recurrent UTIs, CVA, schizophrenia patient condition discussed with patient's daughter by palliative care team, recommended hospice evaluation but the daughter mentioned that she coming out of state to visit the mom and make further decision for seeing her.  All the records are reviewed and case discussed with Care Management/Social Worker. Management plans discussed with the patient, family and they are in agreement.  CODE STATUS:  Full code  DVT Prophylaxis: SCDs  TOTAL TIME TAKING CARE OF THIS PATIENT: 35 minutes.   POSSIBLE D/C IN 2 to 3 DAYS, DEPENDING ON CLINICAL CONDITION.  [name] M.D on 03/11/2018 at 9:23 AM  Between 7am to 6pm - Pager - [phone]  After 6pm go to [email or web address] - password [email or web address] [facility]  Office  [phone]  CC: Primary care physician; Murlean Iba, MD  Note: This dictation was prepared with Dragon dictation along with smaller phrase technology. Any transcriptional errors that result from this process are unintentional.

## 2018-03-12 ENCOUNTER — Inpatient Hospital Stay: Payer: Self-pay

## 2018-03-12 LAB — GLUCOSE, CAPILLARY
GLUCOSE-CAPILLARY: 102 mg/dL — AB (ref 70–99)
Glucose-Capillary: 129 mg/dL — ABNORMAL HIGH (ref 70–99)
Glucose-Capillary: 95 mg/dL (ref 70–99)
Glucose-Capillary: 94 mg/dL (ref 70–99)
Glucose-Capillary: 87 mg/dL (ref 70–99)

## 2018-03-12 LAB — CBC
HCT: 31.9 % — ABNORMAL LOW (ref 36.0–46.0)
Hemoglobin: 9.9 g/dL — ABNORMAL LOW (ref 12.0–15.0)
MCH: 27.5 pg (ref 26.0–34.0)
MCHC: 31 g/dL (ref 30.0–36.0)
MCV: 88.6 fL (ref 80.0–100.0)
NRBC: 0.2 % (ref 0.0–0.2)
Platelets: 137 10*3/uL — ABNORMAL LOW (ref 150–400)
RBC: 3.6 MIL/uL — AB (ref 3.87–5.11)
RDW: 16.4 % — ABNORMAL HIGH (ref 11.5–15.5)
WBC: 15.6 10*3/uL — ABNORMAL HIGH (ref 4.0–10.5)

## 2018-03-12 LAB — SODIUM
SODIUM: 138 mmol/L (ref 135–145)
Sodium: 140 mmol/L (ref 135–145)
Sodium: 139 mmol/L (ref 135–145)

## 2018-03-12 LAB — BASIC METABOLIC PANEL
Anion gap: 8 (ref 5–15)
BUN: 22 mg/dL (ref 8–23)
CO2: 23 mmol/L (ref 22–32)
Calcium: 8.1 mg/dL — ABNORMAL LOW (ref 8.9–10.3)
Chloride: 109 mmol/L (ref 98–111)
Creatinine, Ser: 0.69 mg/dL (ref 0.44–1.00)
GFR calc Af Amer: >60 mL/min (ref >60)
GFR calc non Af Amer: >60 mL/min (ref >60)
Glucose, Bld: 104 mg/dL — ABNORMAL HIGH (ref 70–99)
Potassium: 3.6 mmol/L (ref 3.5–5.1)
Sodium: 140 mmol/L (ref 135–145)

## 2018-03-12 MED ORDER — IPRATROPIUM-ALBUTEROL 0.5-2.5 (3) MG/3ML IN SOLN
3.0000 mL | Freq: Two times a day (BID) | RESPIRATORY_TRACT | Status: DC
  Administered 2018-03-12 – 2018-03-14 (×5): 3 mL via RESPIRATORY_TRACT
  Filled 2018-03-12 (×5): qty 3

## 2018-03-12 MED ORDER — SODIUM CHLORIDE 0.9 % IV SOLN
2.0000 g | Freq: Two times a day (BID) | INTRAVENOUS | Status: DC
  Administered 2018-03-12 – 2018-03-14 (×4): 2 g via INTRAVENOUS
  Filled 2018-03-12 (×5): qty 2

## 2018-03-12 NOTE — Progress Notes (Signed)
Spoke with Helene Kelp about PICC line placement after placing verbal order for PICC, the team will be able to place line tomorrow morning.

## 2018-03-12 NOTE — Progress Notes (Signed)
Pharmacy Electrolyte Monitoring Consult:  Pharmacy consulted to assist in monitoring and replacing electrolytes in this 84 y.o. female admitted on 03/09/2018 with Shortness of Breath   Labs:  Sodium (mmol/L)  Date Value  03/12/2018 140   Potassium (mmol/L)  Date Value  03/12/2018 3.6   Magnesium (mg/dL)  Date Value  03/09/2018 2.1   Calcium (mg/dL)  Date Value  03/12/2018 8.1 (L)   Albumin (g/dL)  Date Value  03/09/2018 3.1 (L)    Assessment: 12/19 K 3.2 - 2 runs KCl 12/20 K 2.9  - got 5 of 6 runs of KCL, follow-up level 3.3 Mag level 12/19 - 2.1  12/21 PM- K 3.2 up from 3.1 after receiving KCl 20 mEq total IV Will order KCl 10 mEq every 1 hour for 4 dose. Will recheck BMP in AM.  Plan: 12/22  K 3.6   Scr 0.69 No supplementation at this time. Will recheck BMP in AM. Pharmacy will continue to follow  [place], PharmD 03/12/2018 9:21 AM

## 2018-03-12 NOTE — Progress Notes (Signed)
[place] at [place] NAME: Jill Copeland    MR#:  [id number]  DATE OF BIRTH:  1931-10-11  SUBJECTIVE:  CHIEF COMPLAINT:   Chief Complaint  Patient presents with  . Shortness of Breath  Patient denies any complaints.  Alert, awake, oriented.  Transfer to telemetry yesterday because of sinus tachycardia and possible need for Cardizem drip.  Patient never required Cardizem drip.   REVIEW OF SYSTEMS:    Review of Systems  Unable to perform ROS: Dementia  Unable to obtain review of systems because of her dementia. Patient able to answer some of my questions appropriately but does have baseline memory defects.  So unable to obtain complete review of systems.   DRUG ALLERGIES:   Allergies  Allergen Reactions  . Penicillins Other (See Comments)    Has patient had a PCN reaction causing immediate rash, facial/tongue/throat swelling, SOB or lightheadedness with hypotension: Unknown Has patient had a PCN reaction causing severe rash involving mucus membranes or skin necrosis: Unknown Has patient had a PCN reaction that required hospitalization: Unknown Has patient had a PCN reaction occurring within the last 10 years: Unknown If all of the above answers are "NO", then may proceed with Cephalosporin use.     VITALS:  Blood pressure 132/66, pulse 95, temperature 98.1 F (36.7 C), temperature source Oral, resp. rate 18, height 5\' 5"  (1.651 m), weight 40.1 kg, SpO2 100 %.  PHYSICAL EXAMINATION:   Physical Exam  GENERAL:  84 y.o.-year-old patient lying in the bed with no acute distress.  Patient appears cachectic.  Chronically ill appearing, thin, frail. EYES: Pupils equal, round, reactive to light . No scleral icterus. Extraocular muscles intact.  HEENT: Head atraumatic, normocephalic. Oropharynx and nasopharynx clear.  NECK:  Supple, no jugular venous distention. No thyroid enlargement, no tenderness.  LUNGS: Decreased breath sounds  bilaterally,No use of accessory muscles of respiration.  CARDIOVASCULAR: S1, S2 tachycardic.Marland Kitchen No murmurs, rubs, or gallops.  ABDOMEN: Soft, nontender, nondistended. Bowel sounds present. No organomegaly or mass.  EXTREMITIES: No cyanosis, clubbing or edema b/l.    NEUROLOGIC: No focal deficit observed but unable to follow directions for full neuro exam due to her baseline dementia. PSYCHIATRIC: The patient is alert and oriented x 2.  Admission impaired, memory impaired SKIN: No obvious rash, lesion, or ulcer.   LABORATORY PANEL:   CBC Recent Labs  Lab 03/12/18 0053  WBC 15.6*  HGB 9.9*  HCT 31.9*  PLT 137*   ------------------------------------------------------------------------------------------------------------------ Chemistries  Recent Labs  Lab 03/09/18 1029  03/09/18 1940  03/12/18 0053  03/12/18 1245  NA 162*   < > 162*   < > 140   < > 139  K 3.2*  --   --    < > 3.6  --   --   CL 123*  --   --    < > 109  --   --   CO2 27  --   --    < > 23  --   --   GLUCOSE 153*  --   --    < > 104*  --   --   BUN 61*  --   --    < > 22  --   --   CREATININE 1.38*  --   --    < > 0.69  --   --   CALCIUM 9.5  --   --    < > 8.1*  --   --  MG  --   --  2.1  --   --   --   --   AST 34  --   --   --   --   --   --   ALT 24  --   --   --   --   --   --   ALKPHOS 113  --   --   --   --   --   --   BILITOT 0.6  --   --   --   --   --   --    < > = values in this interval not displayed.   ------------------------------------------------------------------------------------------------------------------  Cardiac Enzymes Recent Labs  Lab 03/10/18 0106  TROPONINI 0.05*   ------------------------------------------------------------------------------------------------------------------  RADIOLOGY:  No results found.   ASSESSMENT AND PLAN:   84 year old elderly female patient with history of Alzheimer's dementia type 2 diabetes mellitus, esophageal stricture, dysphagia, GERD,  hypothyroidism, recurrent pneumonia Under hospitalist service for shortness of breath  -Healthcare associated pneumonia, patient is afebrile, room air sats 100%.  So overall she is getting better. Continue IV vancomycin and IV cefepime antibiotics Patient has elevated procalcitonin, leukocytosis -History of esophageal stricture and dysphagia Speech therapy has recommended pured diet with nectar liquids.  -Dementia Supportive care, seen by palliative care, call patient's daughter who is visiting the patient from out of state, wanted to her to be full code and according to her she wants to decide when she comes and sees her mom.  For now she is full code.  -Hypernatremia due to dehydration, improved. -Hypothyroidism Synthroid supplementation   Sinus tachycardia secondary to underlying respiratory issues with healthcare associated pneumonia, continue p.o. Cardizem.  Heart rate is around 95 today.  Patient has poor IV access, PICC line consult placed as per my discussion with registered nurse.  Due to multiple medical problems of pneumonia, protein calorie malnutrition, advanced Alzheimer's dementia, dysphagia, recurrent UTIs, CVA, schizophrenia patient condition discussed with patient's daughter by palliative care team, recommended hospice evaluation but the daughter mentioned that she coming out of state to visit the mom and make further decision for seeing her.  All the records are reviewed and case discussed with Care Management/Social Worker. Management plans discussed with the patient, family and they are in agreement.  CODE STATUS: Full code  DVT Prophylaxis: SCDs  TOTAL TIME TAKING CARE OF THIS PATIENT: 35 minutes.   POSSIBLE D/C IN 2 to 3 DAYS, DEPENDING ON CLINICAL CONDITION.  [name] M.D on 03/12/2018 at 2:32 PM  Between 7am to 6pm - Pager - [phone]  After 6pm go to [email or web address] - password [email or web address] [facility]  Office   [phone]  CC: Primary care physician; Murlean Iba, MD  Note: This dictation was prepared with Dragon dictation along with smaller phrase technology. Any transcriptional errors that result from this process are unintentional.

## 2018-03-12 NOTE — Plan of Care (Signed)
  Problem: Clinical Measurements: Goal: Ability to maintain a body temperature in the normal range will improve Outcome: Progressing   Problem: Respiratory: Goal: Ability to maintain adequate ventilation will improve Outcome: Progressing   Problem: Safety: Goal: Ability to remain free from injury will improve Outcome: Progressing

## 2018-03-12 NOTE — Progress Notes (Signed)
Pharmacy Antibiotic Note  Jill Copeland is a 84 y.o. female admitted on 03/09/2018 with pneumonia.  Pharmacy has been consulted for cefepime and vancomycin dosing.  Plan: Vancomycin 1000 mg IV once followed by vancomycin 500 mg every 36 hours.  Goal trough 15-20 mcg/mL.  SCr today improving, will order dose and monitor renal function closely; Scr in AM. Will draw vanc level on day 4 of therapy per policy and to ensure pt is clearing doses (not yet Css).   ke - 0.023 T1/2 - 30.1 VD - 28.1  - Cefepime 2 gm IV every 24 hours  12/21: Scr improved. Will adjust Vancomycin to 500mg  IV q24h. Ke 0.029  t 1/2 23.9  Vd 28.1  Check trough 12/23- not at steady state- assessing clearance  12/22: Cefepime 2 gm IV q12h.     Height: 5\' 5"  (165.1 cm) Weight: 88 lb 6.4 oz (40.1 kg) IBW/kg (Calculated) : 57  Temp (24hrs), Avg:98.4 F (36.9 C), Min:97.9 F (36.6 C), Max:99.1 F (37.3 C)  Recent Labs  Lab 03/09/18 1029 03/09/18 1045 03/09/18 1216 03/10/18 0106 03/11/18 0105 03/12/18 0053  WBC 13.5*  --   --  13.3* 17.7* 15.6*  CREATININE 1.38*  --   --  1.21* 0.90 0.69  LATICACIDVEN  --  3.10* 2.5*  --   --   --     Estimated Creatinine Clearance: 33.1 mL/min (by C-G formula based on SCr of 0.69 mg/dL).    Allergies  Allergen Reactions  . Penicillins Other (See Comments)    Has patient had a PCN reaction causing immediate rash, facial/tongue/throat swelling, SOB or lightheadedness with hypotension: Unknown Has patient had a PCN reaction causing severe rash involving mucus membranes or skin necrosis: Unknown Has patient had a PCN reaction that required hospitalization: Unknown Has patient had a PCN reaction occurring within the last 10 years: Unknown If all of the above answers are "NO", then may proceed with Cephalosporin use.     Antimicrobials this admission: Cefepime 12/19 >>  Vanco 12/19 >>  Azithromycin 12/19 >>  Dose adjustments this admission:   Microbiology  results: 12/19 BCx: NGTD 12/19 UCx: pending   Sputum:   12/19 MRSA PCR: +  Thank you for allowing pharmacy to be a part of this patient's care.  Noralee Space, PharmD, BCPS Clinical Pharmacist 03/12/2018 2:34 PM

## 2018-03-12 NOTE — Progress Notes (Signed)
Consult was placed to IV Team for a 2nd piv site;   Pt was seen by IV Team yesterday (see progress note);  Pt with extremely poor veins, even with ultrasound;   Spoke with Ashly, RN, and explained the pt's poor access;  She will follow up with the MD.  Raynelle Fanning RN IV Team

## 2018-03-13 ENCOUNTER — Inpatient Hospital Stay: Payer: Medicare Other

## 2018-03-13 LAB — BASIC METABOLIC PANEL
Anion gap: 9 (ref 5–15)
BUN: 14 mg/dL (ref 8–23)
CHLORIDE: 105 mmol/L (ref 98–111)
CO2: 24 mmol/L (ref 22–32)
CREATININE: 0.73 mg/dL (ref 0.44–1.00)
Calcium: 7.9 mg/dL — ABNORMAL LOW (ref 8.9–10.3)
GFR calc Af Amer: >60 mL/min (ref >60)
GFR calc non Af Amer: >60 mL/min (ref >60)
Glucose, Bld: 124 mg/dL — ABNORMAL HIGH (ref 70–99)
Potassium: 3.2 mmol/L — ABNORMAL LOW (ref 3.5–5.1)
Sodium: 138 mmol/L (ref 135–145)

## 2018-03-13 LAB — VANCOMYCIN, TROUGH: Vancomycin Tr: 6 ug/mL — ABNORMAL LOW (ref 15–20)

## 2018-03-13 LAB — SODIUM
Sodium: 135 mmol/L (ref 135–145)
Sodium: 136 mmol/L (ref 135–145)
Sodium: 138 mmol/L (ref 135–145)

## 2018-03-13 LAB — GLUCOSE, CAPILLARY
Glucose-Capillary: 90 mg/dL (ref 70–99)
Glucose-Capillary: 127 mg/dL — ABNORMAL HIGH (ref 70–99)
Glucose-Capillary: 90 mg/dL (ref 70–99)
Glucose-Capillary: 100 mg/dL — ABNORMAL HIGH (ref 70–99)

## 2018-03-13 MED ORDER — POTASSIUM CHLORIDE CRYS ER 20 MEQ PO TBCR: 40.0000 meq | EXTENDED_RELEASE_TABLET | ORAL | Status: DC

## 2018-03-13 MED ORDER — VANCOMYCIN HCL 10 G IV SOLR
1250.0000 mg | INTRAVENOUS | Status: DC
  Administered 2018-03-14: 1250 mg via INTRAVENOUS
  Filled 2018-03-13: qty 1250

## 2018-03-13 MED ORDER — VANCOMYCIN HCL IN DEXTROSE 750-5 MG/150ML-% IV SOLN
750.0000 mg | Freq: Once | INTRAVENOUS | Status: AC
  Administered 2018-03-13: 750 mg via INTRAVENOUS
  Filled 2018-03-13: qty 150

## 2018-03-13 MED ORDER — DIVALPROEX SODIUM 500 MG PO DR TAB
500.0000 mg | DELAYED_RELEASE_TABLET | Freq: Two times a day (BID) | ORAL | Status: DC
  Administered 2018-03-13 – 2018-03-14 (×3): 500 mg via ORAL
  Filled 2018-03-13 (×5): qty 1

## 2018-03-13 MED ORDER — POTASSIUM CHLORIDE 10 MEQ/100ML IV SOLN
10.0000 meq | INTRAVENOUS | Status: AC
  Administered 2018-03-13 (×4): 10 meq via INTRAVENOUS
  Filled 2018-03-13 (×4): qty 100

## 2018-03-13 MED ORDER — SODIUM CHLORIDE 0.9% FLUSH
10.0000 mL | Freq: Two times a day (BID) | INTRAVENOUS | Status: DC
  Administered 2018-03-13 – 2018-03-16 (×8): 10 mL

## 2018-03-13 MED ORDER — DILTIAZEM HCL ER COATED BEADS 120 MG PO CP24
120.0000 mg | ORAL_CAPSULE | Freq: Every day | ORAL | Status: DC
  Administered 2018-03-14 – 2018-03-17 (×4): 120 mg via ORAL
  Filled 2018-03-13 (×4): qty 1

## 2018-03-13 MED ORDER — SODIUM CHLORIDE 0.9% FLUSH: 10.0000 mL | INTRAVENOUS | Status: DC | PRN

## 2018-03-13 MED ORDER — SODIUM CHLORIDE 0.9 % IV SOLN
INTRAVENOUS | Status: DC | PRN
  Administered 2018-03-13 – 2018-03-14 (×2): 500 mL via INTRAVENOUS

## 2018-03-13 NOTE — Progress Notes (Addendum)
Pharmacy Antibiotic Note  Jill Copeland is a 84 y.o. female admitted on 03/09/2018 with pneumonia.  Pharmacy has been consulted for cefepime and vancomycin dosing.  Plan: 12/23 @ 0400 VT 6 mcg/mL drawn appropriately, renal function improving. Will increase dose to vanc 1.25g IV q24h w/ a Css of 15 mcg/mL at steady state. Will check trough 12/26 @ 0400 prior to 4th dose. Patient has already received vanc 500 mg IV @ 0400, will give an additional vanc 750 mg IV x 1 to make a total of 1250 mg with the 1250 mg dose starting tomorrow.  Ke 0.0318  T1/2 ~ 24 hrs Goal trough 15 - 20 mcg/mL   Height: 5\' 5"  (165.1 cm) Weight: 88 lb 6.4 oz (40.1 kg) IBW/kg (Calculated) : 57  Temp (24hrs), Avg:98.3 F (36.8 C), Min:98.1 F (36.7 C), Max:98.6 F (37 C)  Recent Labs  Lab 03/09/18 1029 03/09/18 1045 03/09/18 1216 03/10/18 0106 03/11/18 0105 03/12/18 0053 03/13/18 0338  WBC 13.5*  --   --  13.3* 17.7* 15.6*  --   CREATININE 1.38*  --   --  1.21* 0.90 0.69 0.73  LATICACIDVEN  --  3.10* 2.5*  --   --   --   --   VANCOTROUGH  --   --   --   --   --   --  6*    Estimated Creatinine Clearance: 33.1 mL/min (by C-G formula based on SCr of 0.73 mg/dL).    Allergies  Allergen Reactions  . Penicillins Other (See Comments)    Has patient had a PCN reaction causing immediate rash, facial/tongue/throat swelling, SOB or lightheadedness with hypotension: Unknown Has patient had a PCN reaction causing severe rash involving mucus membranes or skin necrosis: Unknown Has patient had a PCN reaction that required hospitalization: Unknown Has patient had a PCN reaction occurring within the last 10 years: Unknown If all of the above answers are "NO", then may proceed with Cephalosporin use.     Antimicrobials this admission: Cefepime 12/19 >>  Vanco 12/19 >>  Azithromycin 12/19 >>  Dose adjustments this admission:   Microbiology results: 12/19 BCx: NGTD 12/19 UCx: pending   Sputum:   12/19  MRSA PCR: +  Thank you for allowing pharmacy to be a part of this patient's care.  Tobie Lords, PharmD, BCPS Clinical Pharmacist 03/13/2018 5:16 AM

## 2018-03-13 NOTE — Progress Notes (Signed)
Pharmacy Electrolyte Monitoring Consult:  Pharmacy consulted to assist in monitoring and replacing electrolytes in this 84 y.o. female admitted on 03/09/2018 with Shortness of Breath   Labs:  Sodium (mmol/L)  Date Value  03/13/2018 138   Potassium (mmol/L)  Date Value  03/13/2018 3.2 (L)   Magnesium (mg/dL)  Date Value  03/09/2018 2.1   Calcium (mg/dL)  Date Value  03/13/2018 7.9 (L)   Albumin (g/dL)  Date Value  03/09/2018 3.1 (L)    Assessment: 12/19 K 3.2 - 2 runs KCl 12/20 K 2.9  - got 5 of 6 runs of KCL, follow-up level 3.3 Mag level 12/19 - 2.1 12/21 PM- K 3.2 up from 3.1 after receiving KCl 20 mEq total IV 12/22  K 3.6   Scr 0.69  Plan: KCl IV 43meq x 4  Will recheck BMP in AM. Pharmacy will continue to follow  Lu Duffel, PharmD, BCPS Clinical Pharmacist 03/13/2018 7:15 AM

## 2018-03-13 NOTE — Progress Notes (Signed)
Order from Dr. Vianne Bulls to d/c PO orders for two doses of potassium this afternoon as patient is currently receiving IV replacement.

## 2018-03-13 NOTE — Plan of Care (Signed)
  Problem: Clinical Measurements: Goal: Ability to maintain a body temperature in the normal range will improve Outcome: Progressing   Problem: Safety: Goal: Ability to remain free from injury will improve Outcome: Progressing   Problem: Nutrition: Goal: Adequate nutrition will be maintained Outcome: Not Progressing Note:  Poor appetite

## 2018-03-13 NOTE — Care Management Important Message (Signed)
Important Message  Patient Details  Name: Ivyanna Sibert MRN: [id number] Date of Birth: 1932-01-24   Medicare Important Message Given:  Yes(Patient unable to sign- unable to contact family)  Copy left in room for patient.    Shelbie Hutching, RN 03/13/2018, 11:50 AM

## 2018-03-13 NOTE — Progress Notes (Signed)
[place] [facility] [place] NAME: Jill Copeland    MR#:  [id number]  DATE OF BIRTH:  [birth year]  SUBJECTIVE:  CHIEF COMPLAINT:   Chief Complaint  Patient presents with  . Shortness of Breath  , Awake, oriented, noted to have left arm swelling, no hypoxia, no fever.  REVIEW OF SYSTEMS:    Review of Systems  Unable to perform ROS: Dementia  Unable to obtain review of systems because of her dementia. Patient able to answer some of my questions appropriately but does have baseline memory defects.  So unable to obtain complete review of systems.   DRUG ALLERGIES:   Allergies  Allergen Reactions  . Penicillins Other (See Comments)    Has patient had a PCN reaction causing immediate rash, facial/tongue/throat swelling, SOB or lightheadedness with hypotension: Unknown Has patient had a PCN reaction causing severe rash involving mucus membranes or skin necrosis: Unknown Has patient had a PCN reaction that required hospitalization: Unknown Has patient had a PCN reaction occurring within the last 10 years: Unknown If all of the above answers are "NO", then may proceed with Cephalosporin use.     VITALS:  Blood pressure (!) 122/58, pulse 89, temperature 98.3 F (36.8 C), temperature source Oral, resp. rate (!) 22, height 5\' 5"  (1.651 m), weight 40.1 kg, SpO2 93 %.  PHYSICAL EXAMINATION:   Physical Exam  GENERAL:  84 y.o.-year-old patient lying in the bed with no acute distress.  Patient appears cachectic.  Chronically ill appearing, thin, frail. EYES: Pupils equal, round, reactive to light . No scleral icterus. Extraocular muscles intact.  HEENT: Head atraumatic, normocephalic. Oropharynx and nasopharynx clear.  NECK:  Supple, no jugular venous distention. No thyroid enlargement, no tenderness.  LUNGS: Decreased breath sounds bilaterally,No use of accessory muscles of respiration.  CARDIOVASCULAR: S1, S2 tachycardic.Marland Kitchen No murmurs, rubs, or gallops.   ABDOMEN: Soft, nontender, nondistended. Bowel sounds present. No organomegaly or mass.  EXTREMITIES: No cyanosis, clubbing .  Left arm edema noted   , no fluctuation NEUROLOGIC: No focal deficit observed but unable to follow directions for full neuro exam due to her baseline dementia. PSYCHIATRIC: The patient is alert and oriented x 2.  Admission impaired, memory impaired SKIN: No obvious rash, lesion, or ulcer.   LABORATORY PANEL:   CBC Recent Labs  Lab 03/12/18 0053  WBC 15.6*  HGB 9.9*  HCT 31.9*  PLT 137*   ------------------------------------------------------------------------------------------------------------------ Chemistries  Recent Labs  Lab 03/09/18 1029  03/09/18 1940  03/13/18 0338 03/13/18 1315  NA 162*   < > 162*   < > 138 135  K 3.2*  --   --    < > 3.2*  --   CL 123*  --   --    < > 105  --   CO2 27  --   --    < > 24  --   GLUCOSE 153*  --   --    < > 124*  --   BUN 61*  --   --    < > 14  --   CREATININE 1.38*  --   --    < > 0.73  --   CALCIUM 9.5  --   --    < > 7.9*  --   MG  --   --  2.1  --   --   --   AST 34  --   --   --   --   --  ALT 24  --   --   --   --   --   ALKPHOS 113  --   --   --   --   --   BILITOT 0.6  --   --   --   --   --    < > = values in this interval not displayed.   ------------------------------------------------------------------------------------------------------------------  Cardiac Enzymes Recent Labs  Lab 03/10/18 0106  TROPONINI 0.05*   ------------------------------------------------------------------------------------------------------------------  RADIOLOGY:  [place] Ekg Site Rite  Result Date: 03/12/2018 If Site Rite image not attached, placement could not be confirmed due to current cardiac rhythm.    ASSESSMENT AND PLAN:   84 year old elderly female patient with history of Alzheimer's dementia type 2 diabetes mellitus, esophageal stricture, dysphagia, GERD, hypothyroidism, recurrent  pneumonia Under hospitalist service for shortness of breath  -Healthcare associated pneumonia, patient is afebrile, room air sats 100%.  So overall she is getting better. Continue IV vancomycin and IV cefepime antibiotics Patient has elevated procalcitonin, leukocytosis continue IV antibiotics, check CBC again. -History of esophageal stricture and dysphagia Speech therapy has recommended pured diet with nectar liquids.  -Dementia at baseline, able to answer some of the questions appropriately.  -Hypernatremia due to dehydration, improved. -Hypothyroidism Synthroid supplementation   Sinus tachycardia: Use Cardizem at current dose. Left arm swelling, check ultrasound to evaluate for DVT. Hypokalemia, replace potassium Dementia with behavioral disturbances, use Depakote changed from IV to p.o.. Received PICC line because of poor IV access. Spoke with patient's nurse.  All the records are reviewed and case discussed with Care Management/Social Worker. Management plans discussed with the patient, family and they are in agreement.  CODE STATUS: Full code  DVT Prophylaxis: SCDs  TOTAL TIME TAKING CARE OF THIS PATIENT: 35 minutes.   POSSIBLE D/C IN 2 to 3 DAYS, DEPENDING ON CLINICAL CONDITION.  [name] M.D on 03/13/2018 at 2:17 PM  Between 7am to 6pm - Pager - [phone]  After 6pm go to [email or web address] - password [email or web address] [facility]  Office  [phone]  CC: Primary care physician; Murlean Iba, MD  Note: This dictation was prepared with Dragon dictation along with smaller phrase technology. Any transcriptional errors that result from this process are unintentional.

## 2018-03-13 NOTE — Progress Notes (Signed)
PALLIATIVE NOTE:  Call to speak with daughter, Lattie [facility]). No answer and voicemail left. Wanted to follow up with her regarding our [place] discussion on Friday. Daughter at that time expressed her wishes for her mother to remain a full code until she arrived over the weekend and able to see her mother physically and her condition. She also wished to make decisions regarding palliative versus hospice involvement at discharge. Daughter verbalized she would update medical team over the weekend with updated decisions. Patient current remains a full code.   Will attempt to reach out to the daughter tomorrow for follow up and answer in questions she may have.   Alda Lea, AGPCNP-BC Palliative Medicine Team  Phone: [phone] Fax: [phone] Pager: [phone] Amion: [place]    NO CHARGE NOTE

## 2018-03-14 ENCOUNTER — Inpatient Hospital Stay: Payer: Medicare Other

## 2018-03-14 LAB — BASIC METABOLIC PANEL
Anion gap: 6 (ref 5–15)
BUN: 12 mg/dL (ref 8–23)
CO2: 26 mmol/L (ref 22–32)
Calcium: 8 mg/dL — ABNORMAL LOW (ref 8.9–10.3)
Chloride: 108 mmol/L (ref 98–111)
Creatinine, Ser: 0.75 mg/dL (ref 0.44–1.00)
GFR calc non Af Amer: >60 mL/min (ref >60)
Glucose, Bld: 89 mg/dL (ref 70–99)
Potassium: 3.6 mmol/L (ref 3.5–5.1)
Sodium: 140 mmol/L (ref 135–145)

## 2018-03-14 LAB — CULTURE, BLOOD (ROUTINE X 2)
CULTURE: NO GROWTH
Culture: NO GROWTH

## 2018-03-14 LAB — CBC
HCT: 30.9 % — ABNORMAL LOW (ref 36.0–46.0)
Hemoglobin: 9.9 g/dL — ABNORMAL LOW (ref 12.0–15.0)
MCH: 27.4 pg (ref 26.0–34.0)
MCHC: 32 g/dL (ref 30.0–36.0)
MCV: 85.6 fL (ref 80.0–100.0)
NRBC: 0 % (ref 0.0–0.2)
Platelets: 167 10*3/uL (ref 150–400)
RBC: 3.61 MIL/uL — ABNORMAL LOW (ref 3.87–5.11)
RDW: 16.1 % — ABNORMAL HIGH (ref 11.5–15.5)
WBC: 8.1 10*3/uL (ref 4.0–10.5)

## 2018-03-14 LAB — SODIUM
Sodium: 135 mmol/L (ref 135–145)
Sodium: 138 mmol/L (ref 135–145)
Sodium: 139 mmol/L (ref 135–145)

## 2018-03-14 LAB — GLUCOSE, CAPILLARY
Glucose-Capillary: 77 mg/dL (ref 70–99)
Glucose-Capillary: 93 mg/dL (ref 70–99)
Glucose-Capillary: 91 mg/dL (ref 70–99)
Glucose-Capillary: 98 mg/dL (ref 70–99)

## 2018-03-14 MED ORDER — POTASSIUM CHLORIDE 10 MEQ/100ML IV SOLN
10.0000 meq | INTRAVENOUS | Status: AC
  Administered 2018-03-14 (×4): 10 meq via INTRAVENOUS
  Filled 2018-03-14 (×4): qty 100

## 2018-03-14 NOTE — Plan of Care (Signed)
  Problem: Clinical Measurements: Goal: Ability to maintain a body temperature in the normal range will improve Outcome: Progressing   Problem: Respiratory: Goal: Ability to maintain a clear airway will improve Outcome: Progressing   Problem: Clinical Measurements: Goal: Diagnostic test results will improve Outcome: Progressing   Problem: Skin Integrity: Goal: Risk for impaired skin integrity will decrease Outcome: Progressing

## 2018-03-14 NOTE — Progress Notes (Signed)
Pharmacy Electrolyte Monitoring Consult:  Pharmacy consulted to assist in monitoring and replacing electrolytes in this 84 y.o. female admitted on 03/09/2018 with Shortness of Breath   Labs:  Sodium (mmol/L)  Date Value  03/14/2018 140   Potassium (mmol/L)  Date Value  03/14/2018 3.6   Magnesium (mg/dL)  Date Value  03/09/2018 2.1   Calcium (mg/dL)  Date Value  03/14/2018 8.0 (L)   Albumin (g/dL)  Date Value  03/09/2018 3.1 (L)    Assessment: 12/19 K 3.2 - 2 runs KCl 12/20 K 2.9  - got 5 of 6 runs of KCL, follow-up level 3.3 Mag level 12/19 - 2.1 12/21 PM- K 3.2 up from 3.1 after receiving KCl 20 mEq total IV 12/22  K 3.6   Scr 0.69 12/23 K 3.2 40 meq IV potassium 12/24 K 3.6  Plan: Goals K ~ 4  KCl IV 103meq x 4  Will recheck K in AM 12/26. Pharmacy will continue to follow  Lu Duffel, PharmD, BCPS Clinical Pharmacist 03/14/2018 7:15 AM

## 2018-03-14 NOTE — Progress Notes (Signed)
[place] at [place] NAME: Jill Copeland    MR#:  [id number]  DATE OF BIRTH:  Sep 03, 1931  SUBJECTIVE:  CHIEF COMPLAINT:   Chief Complaint  Patient presents with  . Shortness of Breath  , Alert, awake, oriented, noted to have left arm swelling actually it is better than yesterday.  REVIEW OF SYSTEMS:    Review of Systems  Unable to perform ROS: Dementia  Unable to obtain review of systems because of her dementia. Patient able to answer some of my questions appropriately but does have baseline memory defects.  So unable to obtain complete review of systems.   DRUG ALLERGIES:   Allergies  Allergen Reactions  . Penicillins Other (See Comments)    Has patient had a PCN reaction causing immediate rash, facial/tongue/throat swelling, SOB or lightheadedness with hypotension: Unknown Has patient had a PCN reaction causing severe rash involving mucus membranes or skin necrosis: Unknown Has patient had a PCN reaction that required hospitalization: Unknown Has patient had a PCN reaction occurring within the last 10 years: Unknown If all of the above answers are "NO", then may proceed with Cephalosporin use.     VITALS:  Blood pressure 130/80, pulse (!) 54, temperature 97.7 F (36.5 C), temperature source Oral, resp. rate 18, height 5\' 5"  (1.651 m), weight 40.1 kg, SpO2 100 %.  PHYSICAL EXAMINATION:   Physical Exam  GENERAL:  84 y.o.-year-old patient lying in the bed with no acute distress.  Patient appears cachectic.  Chronically ill appearing, thin, frail. EYES: Pupils equal, round, reactive to light . No scleral icterus. Extraocular muscles intact.  HEENT: Head atraumatic, normocephalic. Oropharynx and nasopharynx clear.  NECK:  Supple, no jugular venous distention. No thyroid enlargement, no tenderness.  LUNGS: Decreased breath sounds bilaterally,No use of accessory muscles of respiration.  CARDIOVASCULAR: S1, S2 regular,.. No murmurs,  rubs, or gallops.  ABDOMEN: Soft, nontender, nondistended. Bowel sounds present. No organomegaly or mass.  EXTREMITIES: No cyanosis, clubbing .  Left arm edema noted   , no fluctuation NEUROLOGIC: No focal deficit observed but unable to follow directions for full neuro exam due to her baseline dementia. PSYCHIATRIC: The patient is alert and oriented x 2.  Admission impaired, memory impaired SKIN: No obvious rash, lesion, or ulcer.   LABORATORY PANEL:   CBC Recent Labs  Lab 03/14/18 0509  WBC 8.1  HGB 9.9*  HCT 30.9*  PLT 167   ------------------------------------------------------------------------------------------------------------------ Chemistries  Recent Labs  Lab 03/09/18 1029  03/09/18 1940  03/14/18 0509  NA 162*   < > 162*   < > 140  K 3.2*  --   --    < > 3.6  CL 123*  --   --    < > 108  CO2 27  --   --    < > 26  GLUCOSE 153*  --   --    < > 89  BUN 61*  --   --    < > 12  CREATININE 1.38*  --   --    < > 0.75  CALCIUM 9.5  --   --    < > 8.0*  MG  --   --  2.1  --   --   AST 34  --   --   --   --   ALT 24  --   --   --   --   ALKPHOS 113  --   --   --   --  BILITOT 0.6  --   --   --   --    < > = values in this interval not displayed.   ------------------------------------------------------------------------------------------------------------------  Cardiac Enzymes Recent Labs  Lab 03/10/18 0106  TROPONINI 0.05*   ------------------------------------------------------------------------------------------------------------------  RADIOLOGY:  US Venous Img Upper Uni Left  Result Date: 03/14/2018 CLINICAL DATA:  Swelling EXAM: LEFT UPPER EXTREMITY VENOUS DOPPLER ULTRASOUND TECHNIQUE: Gray-scale sonography with graded compression, as well as color Doppler and duplex ultrasound were performed to evaluate the upper extremity deep venous system from the level of the subclavian vein and including the jugular, axillary, basilic, radial, ulnar and upper  cephalic vein. Spectral Doppler was utilized to evaluate flow at rest and with distal augmentation maneuvers. COMPARISON:  None. FINDINGS: Contralateral Subclavian Vein: Respiratory phasicity is normal and symmetric with the symptomatic side. No evidence of thrombus. Normal compressibility. Internal Jugular Vein: No evidence of thrombus. Normal compressibility, respiratory phasicity and response to augmentation. Subclavian Vein: No evidence of thrombus. Normal compressibility, respiratory phasicity and response to augmentation. Axillary Vein: No evidence of thrombus. Normal compressibility, respiratory phasicity and response to augmentation. Cephalic Vein: No evidence of thrombus. Normal compressibility, respiratory phasicity and response to augmentation. Basilic Vein: No evidence of thrombus. Normal compressibility, respiratory phasicity and response to augmentation. Brachial Veins: No evidence of thrombus. Normal compressibility, respiratory phasicity and response to augmentation. Radial Veins: No evidence of thrombus. Normal compressibility, respiratory phasicity and response to augmentation. Ulnar Veins: No evidence of thrombus. Normal compressibility, respiratory phasicity and response to augmentation. Venous Reflux:  None visualized. Other Findings:  None visualized. IMPRESSION: No evidence of DVT within the left upper extremity. Electronically Signed   By: Rolm Baptise M.D.   On: 03/14/2018 10:44   [place] Ekg Site Rite  Result Date: 03/12/2018 If Site Rite image not attached, placement could not be confirmed due to current cardiac rhythm.    ASSESSMENT AND PLAN:   84 year old elderly female patient with history of Alzheimer's dementia type 2 diabetes mellitus, esophageal stricture, dysphagia, GERD, hypothyroidism, recurrent pneumonia Under hospitalist service for shortness of breath  -Healthcare associated pneumonia, patient is afebrile, room air sats 100%.  So overall she is getting better.    Patient has elevated procalcitonin, leukocytosis continue IV antibiotics, leukocytosis improved.  No fever.  No hypoxia  -History of esophageal stricture and dysphagia Speech therapy has recommended pured diet with nectar liquids.  -Dementia at baseline, able to answer some of the questions appropriately.  -Hypernatremia due to dehydration, improved.  -Hypothyroidism Synthroid supplementation   Sinus tachycardia: Use Cardizem , changed to long-acting Cardizem yesterday. Left arm swelling, ultrasound of left arm negative for DVT study.   Hypokalemia improved after replacement.  Dementia with behavioral disturbances, use Depakote changed from IV to p.o.. Received PICC line because of poor IV access.   Prognosis is poor, seen by palliative care, family still wants full code.  Patient likely be discharged back to nursing home with palliative care team to follow next 1 to 2 days.  All the records are reviewed and case discussed with Care Management/Social Worker. Management plans discussed with the patient, family and they are in agreement.  CODE STATUS: Full code  DVT Prophylaxis: SCDs  TOTAL TIME TAKING CARE OF THIS PATIENT: 35 minutes.   POSSIBLE D/C IN 2 to 3 DAYS, DEPENDING ON CLINICAL CONDITION.  [name] M.D on 03/14/2018 at 12:00 PM  Between 7am to 6pm - Pager - [phone]  After 6pm go to [email or web address] - [place]  [facility]  Office  [phone]  CC: Primary care physician; Murlean Iba, MD  Note: This dictation was prepared with Dragon dictation along with smaller phrase technology. Any transcriptional errors that result from this process are unintentional.

## 2018-03-15 LAB — GLUCOSE, CAPILLARY
Glucose-Capillary: 95 mg/dL (ref 70–99)
Glucose-Capillary: 113 mg/dL — ABNORMAL HIGH (ref 70–99)
Glucose-Capillary: 96 mg/dL (ref 70–99)
Glucose-Capillary: 106 mg/dL — ABNORMAL HIGH (ref 70–99)

## 2018-03-15 LAB — SODIUM
Sodium: 134 mmol/L — ABNORMAL LOW (ref 135–145)
Sodium: 134 mmol/L — ABNORMAL LOW (ref 135–145)
Sodium: 134 mmol/L — ABNORMAL LOW (ref 135–145)
Sodium: 134 mmol/L — ABNORMAL LOW (ref 135–145)

## 2018-03-15 MED ORDER — CEFDINIR 300 MG PO CAPS
300.0000 mg | ORAL_CAPSULE | Freq: Two times a day (BID) | ORAL | Status: DC
  Administered 2018-03-15 – 2018-03-17 (×5): 300 mg via ORAL
  Filled 2018-03-15 (×7): qty 1

## 2018-03-15 MED ORDER — VALPROIC ACID 250 MG/5ML PO SOLN
250.0000 mg | Freq: Four times a day (QID) | ORAL | Status: DC
  Administered 2018-03-15 – 2018-03-17 (×6): 250 mg via ORAL
  Filled 2018-03-15 (×9): qty 5

## 2018-03-15 MED ORDER — CHLORHEXIDINE GLUCONATE CLOTH 2 % EX PADS
6.0000 | MEDICATED_PAD | Freq: Every day | CUTANEOUS | Status: DC
  Administered 2018-03-16 – 2018-03-17 (×2): 6 via TOPICAL

## 2018-03-15 MED ORDER — LISINOPRIL 10 MG PO TABS
10.0000 mg | ORAL_TABLET | Freq: Every day | ORAL | Status: DC
  Administered 2018-03-15 – 2018-03-17 (×3): 10 mg via ORAL
  Filled 2018-03-15 (×4): qty 1

## 2018-03-15 MED ORDER — IPRATROPIUM-ALBUTEROL 0.5-2.5 (3) MG/3ML IN SOLN: 3.0000 mL | Freq: Four times a day (QID) | RESPIRATORY_TRACT | Status: DC | PRN

## 2018-03-15 MED ORDER — MUPIROCIN 2 % EX OINT
1.0000 "application " | TOPICAL_OINTMENT | Freq: Two times a day (BID) | CUTANEOUS | Status: DC
  Administered 2018-03-15 – 2018-03-17 (×5): 1 via NASAL
  Filled 2018-03-15: qty 22

## 2018-03-15 NOTE — Progress Notes (Signed)
Family brought a cinnamon bun and OJ for the patient.  I explained that she's at risk of choking and her food should be chopped into little pieces.  They essentially ignored me saying she's always had problems with choking. Needs further explanations and teaching.

## 2018-03-15 NOTE — Consult Note (Signed)
MEDICATION RELATED CONSULT NOTE - INITIAL   Pharmacy Consult for Depakene (Valproic acid) Indication: Replacing PTA Depakote therapy  Allergies  Allergen Reactions  . Penicillins Other (See Comments)    Has patient had a PCN reaction causing immediate rash, facial/tongue/throat swelling, SOB or lightheadedness with hypotension: Unknown Has patient had a PCN reaction causing severe rash involving mucus membranes or skin necrosis: Unknown Has patient had a PCN reaction that required hospitalization: Unknown Has patient had a PCN reaction occurring within the last 10 years: Unknown If all of the above answers are "NO", then may proceed with Cephalosporin use.     Patient Measurements: Height: 5\' 5"  (165.1 cm) Weight: 104 lb 8 oz (47.4 kg) IBW/kg (Calculated) : 57   Vital Signs: Temp: 97.8 F (36.6 C) (12/25 0818) Temp Source: Oral (12/25 0818) BP: 182/72 (12/25 0818) Pulse Rate: 65 (12/25 0818) Intake/Output from previous day: 12/24 0701 - 12/25 0700 In: 10 [I.V.:10] Out: 700 [Urine:700] Intake/Output from this shift: No intake/output data recorded.  Labs: Recent Labs    03/13/18 0338 03/14/18 0509  WBC  --  8.1  HGB  --  9.9*  HCT  --  30.9*  PLT  --  167  CREATININE 0.73 0.75   Estimated Creatinine Clearance: 39.2 mL/min (by C-G formula based on SCr of 0.75 mg/dL).   Microbiology: Recent Results (from the past 720 hour(s))  Blood Culture (routine x 2)     Status: None   Collection Time: 03/09/18 10:29 AM  Result Value Ref Range Status   Specimen Description BLOOD RIGHT AC  Final   Special Requests   Final    BOTTLES DRAWN AEROBIC AND ANAEROBIC Blood Culture results may not be optimal due to an inadequate volume of blood received in culture bottles   Culture   Final    NO GROWTH 5 DAYS Performed at [facility], [address], [place], [place] [address]    Report Status 03/14/2018 FINAL  Final  Blood Culture (routine x 2)     Status: None   Collection Time: 03/09/18 10:29 AM  Result Value Ref Range Status   Specimen Description BLOOD LEFT WRIST  Final   Special Requests   Final    BOTTLES DRAWN AEROBIC AND ANAEROBIC Blood Culture results may not be optimal due to an inadequate volume of blood received in culture bottles   Culture   Final    NO GROWTH 5 DAYS Performed at [facility], [address], [place], [place] [address]    Report Status 03/14/2018 FINAL  Final  Urine culture     Status: None   Collection Time: 03/09/18 10:29 AM  Result Value Ref Range Status   Specimen Description   Final    URINE, RANDOM Performed at [facility], [address], [place], [place] [address]    Special Requests   Final    NONE Performed at [facility], [address] [place], [place] [address]    Culture   Final    NO GROWTH Performed at [facility], [place] [address] [place], [place] [address]    Report Status 03/10/2018 FINAL  Final  MRSA PCR Screening     Status: Abnormal   Collection Time: 03/09/18  2:50 PM  Result Value Ref Range Status   MRSA by PCR POSITIVE (A) NEGATIVE Final    Comment:        The GeneXpert MRSA Assay (FDA approved for NASAL specimens only), is one  component of a comprehensive MRSA colonization surveillance program. It is not intended to diagnose MRSA infection nor to guide or monitor treatment for MRSA infections. RESULT CALLED TO, READ BACK BY AND VERIFIED WITH: PHYLIS KING @1845  03/09/18 AKT Performed at [facility], [address], [place], [place] [address]     Medical History: Past Medical History:  Diagnosis Date  . Alzheimer disease ([place])   . Arthritis   . Bacteremia    a. 09/2016 - admit w/ S hominis bacteremia w/ ? of pacer lead vegetation;  b. 09/2016 TEE:  unable to fully pass TEE probe due to esoph stricture; c. IV Abx per ID.  . Diabetes mellitus without complication ([place])   . Dysphagia causing pulmonary aspiration with  swallowing   . Esophageal stricture    a. @ site of anastamosis from hiatal hernia surgery.  Marland Kitchen GERD (gastroesophageal reflux disease)   . Hiatal hernia    a. s/p prior repair with fistula;  b. 09/2016 CT chest: moderate to large hiatal hernia - stable.  . Hypothyroidism   . Pneumococcal pneumonia ([place])   . Recurrent pneumonia   . Recurrent UTI   . Schizo affective schizophrenia ([place])   . Stroke (cerebrum) ([place])   . Symptomatic bradycardia    a. s/p biotronik PPM 02/2005 at [facility] in [place], [place]  . Syncope    a. 02/2005 s/p Biotronik DC PPM @ [facility] in [place], [place].    Medications:  Discontinuing oral Depakote in lieu of liquid depakene  Assessment: 1:1 substitution with adjustment form q12 to q6 (same TDD)   Plan:  Depakene liq 250mg  [place], PharmD, BCPS Clinical Pharmacist 03/15/2018 1:40 PM

## 2018-03-15 NOTE — Progress Notes (Signed)
[facility] [place] NAME: Jill Copeland    MR#:  [id number]  DATE OF BIRTH:  1932/01/05  SUBJECTIVE:  CHIEF COMPLAINT:   Chief Complaint  Patient presents with  . Shortness of Breath  , Alert, awake, left arm swelling is improving  REVIEW OF SYSTEMS:    Review of Systems  Unable to perform ROS: Dementia  Unable to obtain review of systems because of her dementia. Patient able to answer some of my questions appropriately but does have baseline memory defects.  So unable to obtain complete review of systems.   DRUG ALLERGIES:   Allergies  Allergen Reactions  . Penicillins Other (See Comments)    Has patient had a PCN reaction causing immediate rash, facial/tongue/throat swelling, SOB or lightheadedness with hypotension: Unknown Has patient had a PCN reaction causing severe rash involving mucus membranes or skin necrosis: Unknown Has patient had a PCN reaction that required hospitalization: Unknown Has patient had a PCN reaction occurring within the last 10 years: Unknown If all of the above answers are "NO", then may proceed with Cephalosporin use.     VITALS:  Blood pressure (!) 182/72, pulse 65, temperature 97.8 F (36.6 C), temperature source Oral, resp. rate 20, height 5\' 5"  (1.651 m), weight 47.4 kg, SpO2 91 %.  PHYSICAL EXAMINATION:   Physical Exam  GENERAL:  84 y.o.-year-old patient lying in the bed with no acute distress.  Patient appears cachectic.  Chronically ill appearing, thin, frail. EYES: Pupils equal, round, reactive to light . No scleral icterus. Extraocular muscles intact.  HEENT: Head atraumatic, normocephalic. Oropharynx and nasopharynx clear.  NECK:  Supple, no jugular venous distention. No thyroid enlargement, no tenderness.  LUNGS: Decreased breath sounds bilaterally,No use of accessory muscles of respiration.  CARDIOVASCULAR: S1, S2 regular,.. No murmurs, rubs, or gallops.  ABDOMEN: Soft, nontender,  nondistended. Bowel sounds present. EXTREMITIES: No cyanosis, clubbing .  Left arm edema noted   , no fluctuation NEUROLOGIC: No focal deficit observed but unable to follow directions for full neuro exam due to her baseline dementia. PSYCHIATRIC: The patient is alert and oriented x 1.  Admission impaired, memory impaired SKIN: No obvious rash, lesion, or ulcer.   LABORATORY PANEL:   CBC Recent Labs  Lab 03/14/18 0509  WBC 8.1  HGB 9.9*  HCT 30.9*  PLT 167   ------------------------------------------------------------------------------------------------------------------ Chemistries  Recent Labs  Lab 03/09/18 1029  03/09/18 1940  03/14/18 0509  03/15/18 1020  NA 162*   < > 162*   < > 140   < > 134*  K 3.2*  --   --    < > 3.6  --   --   CL 123*  --   --    < > 108  --   --   CO2 27  --   --    < > 26  --   --   GLUCOSE 153*  --   --    < > 89  --   --   BUN 61*  --   --    < > 12  --   --   CREATININE 1.38*  --   --    < > 0.75  --   --   CALCIUM 9.5  --   --    < > 8.0*  --   --   MG  --   --  2.1  --   --   --   --  AST 34  --   --   --   --   --   --   ALT 24  --   --   --   --   --   --   ALKPHOS 113  --   --   --   --   --   --   BILITOT 0.6  --   --   --   --   --   --    < > = values in this interval not displayed.   ------------------------------------------------------------------------------------------------------------------  Cardiac Enzymes Recent Labs  Lab 03/10/18 0106  TROPONINI 0.05*   ------------------------------------------------------------------------------------------------------------------  RADIOLOGY:  US Venous Img Upper Uni Left  Result Date: 03/14/2018 CLINICAL DATA:  Swelling EXAM: LEFT UPPER EXTREMITY VENOUS DOPPLER ULTRASOUND TECHNIQUE: Gray-scale sonography with graded compression, as well as color Doppler and duplex ultrasound were performed to evaluate the upper extremity deep venous system from the level of the subclavian vein  and including the jugular, axillary, basilic, radial, ulnar and upper cephalic vein. Spectral Doppler was utilized to evaluate flow at rest and with distal augmentation maneuvers. COMPARISON:  None. FINDINGS: Contralateral Subclavian Vein: Respiratory phasicity is normal and symmetric with the symptomatic side. No evidence of thrombus. Normal compressibility. Internal Jugular Vein: No evidence of thrombus. Normal compressibility, respiratory phasicity and response to augmentation. Subclavian Vein: No evidence of thrombus. Normal compressibility, respiratory phasicity and response to augmentation. Axillary Vein: No evidence of thrombus. Normal compressibility, respiratory phasicity and response to augmentation. Cephalic Vein: No evidence of thrombus. Normal compressibility, respiratory phasicity and response to augmentation. Basilic Vein: No evidence of thrombus. Normal compressibility, respiratory phasicity and response to augmentation. Brachial Veins: No evidence of thrombus. Normal compressibility, respiratory phasicity and response to augmentation. Radial Veins: No evidence of thrombus. Normal compressibility, respiratory phasicity and response to augmentation. Ulnar Veins: No evidence of thrombus. Normal compressibility, respiratory phasicity and response to augmentation. Venous Reflux:  None visualized. Other Findings:  None visualized. IMPRESSION: No evidence of DVT within the left upper extremity. Electronically Signed   By: Rolm Baptise M.D.   On: 03/14/2018 10:44     ASSESSMENT AND PLAN:   84 year old elderly female patient with history of Alzheimer's dementia type 2 diabetes mellitus, esophageal stricture, dysphagia, GERD, hypothyroidism, recurrent pneumonia Under hospitalist service for shortness of breath  -Healthcare associated pneumonia, patient is afebrile, room air sats 100%.   Clinically improving Patient has elevated procalcitonin, leukocytosis which are better Change IV antibiotics  cefepime and vancomycin to Omnicef  -History of esophageal stricture and dysphagia Speech therapy has recommended pured diet with nectar liquids.  Hypertension blood pressure elevated On Cardizem CD, add lisinopril and titrate as needed  -Dementia at baseline, able to answer some of the questions appropriately.  -Hypernatremia due to dehydration, improved.  -Hypothyroidism Synthroid supplementation   Sinus tachycardia: changed to long-acting Cardizem   Left arm swelling, ultrasound of left arm negative for DVT study.    Hypokalemia improved after replacement.  Dementia with behavioral disturbances, use Depakote changed from IV to p.o.. Received PICC line because of poor IV access.   Prognosis is poor, seen by palliative care, family still wants full code.  Patient likely be discharged back to nursing home with palliative care team to follow next 1 to 2 days.  All the records are reviewed and case discussed with Care Management/Social Worker. Management plans discussed with the patient, family and they are in agreement.  CODE STATUS: Full code  DVT Prophylaxis: SCDs  TOTAL TIME TAKING CARE OF THIS PATIENT: 35 minutes.   POSSIBLE D/C IN 1-2 DAYS, DEPENDING ON CLINICAL CONDITION.  Nicholes Mango M.D on 03/15/2018 at 2:22 PM  Between 7am to 6pm - Pager - [phone]  After 6pm go to [email or web address] - password [email or web address] [facility]  Office  [phone]  CC: Primary care physician; Murlean Iba, MD  Note: This dictation was prepared with Dragon dictation along with smaller phrase technology. Any transcriptional errors that result from this process are unintentional.

## 2018-03-16 ENCOUNTER — Inpatient Hospital Stay: Payer: Medicare Other

## 2018-03-16 DIAGNOSIS — R609 Edema, unspecified: Secondary | ICD-10-CM

## 2018-03-16 DIAGNOSIS — J9601 Acute respiratory failure with hypoxia: Secondary | ICD-10-CM

## 2018-03-16 DIAGNOSIS — R05 Cough: Secondary | ICD-10-CM

## 2018-03-16 LAB — INFLUENZA PANEL BY PCR (TYPE A & B)
Influenza A By PCR: NEGATIVE
Influenza B By PCR: NEGATIVE

## 2018-03-16 LAB — RESPIRATORY PANEL BY PCR
Adenovirus: NOT DETECTED
Bordetella pertussis: NOT DETECTED
Chlamydophila pneumoniae: NOT DETECTED
Coronavirus 229E: NOT DETECTED
Coronavirus HKU1: NOT DETECTED
Coronavirus NL63: NOT DETECTED
Coronavirus OC43: NOT DETECTED
Influenza A: NOT DETECTED
Influenza B: NOT DETECTED
Metapneumovirus: NOT DETECTED
Mycoplasma pneumoniae: NOT DETECTED
PARAINFLUENZA VIRUS 2-RVPPCR: NOT DETECTED
Parainfluenza Virus 1: NOT DETECTED
Parainfluenza Virus 3: NOT DETECTED
Parainfluenza Virus 4: NOT DETECTED
Respiratory Syncytial Virus: DETECTED — AB
Rhinovirus / Enterovirus: NOT DETECTED

## 2018-03-16 LAB — CBC WITH DIFFERENTIAL/PLATELET
Band Neutrophils: 5 %
Basophils Absolute: 0 10*3/uL (ref 0.0–0.1)
Basophils Relative: 0 %
Blasts: 0 %
Eosinophils Absolute: 0.5 10*3/uL (ref 0.0–0.5)
Eosinophils Relative: 4 %
HCT: 35.7 % — ABNORMAL LOW (ref 36.0–46.0)
Hemoglobin: 11.4 g/dL — ABNORMAL LOW (ref 12.0–15.0)
LYMPHS PCT: 8 %
Lymphs Abs: 0.9 10*3/uL (ref 0.7–4.0)
MCH: 27.4 pg (ref 26.0–34.0)
MCHC: 31.9 g/dL (ref 30.0–36.0)
MCV: 85.8 fL (ref 80.0–100.0)
Metamyelocytes Relative: 8 %
Monocytes Absolute: 1.1 10*3/uL — ABNORMAL HIGH (ref 0.1–1.0)
Monocytes Relative: 10 %
Myelocytes: 2 %
Neutro Abs: 8.8 10*3/uL — ABNORMAL HIGH (ref 1.7–7.7)
Neutrophils Relative %: 63 %
Other: 0 %
Platelets: 303 10*3/uL (ref 150–400)
Promyelocytes Relative: 0 %
RBC: 4.16 MIL/uL (ref 3.87–5.11)
RDW: 16.5 % — ABNORMAL HIGH (ref 11.5–15.5)
WBC: 11.3 10*3/uL — ABNORMAL HIGH (ref 4.0–10.5)
nRBC: 0 % (ref 0.0–0.2)
nRBC: 0 /100 WBC

## 2018-03-16 LAB — GLUCOSE, CAPILLARY
Glucose-Capillary: 89 mg/dL (ref 70–99)
Glucose-Capillary: 127 mg/dL — ABNORMAL HIGH (ref 70–99)
Glucose-Capillary: 102 mg/dL — ABNORMAL HIGH (ref 70–99)
Glucose-Capillary: 177 mg/dL — ABNORMAL HIGH (ref 70–99)

## 2018-03-16 LAB — POTASSIUM: Potassium: 3.9 mmol/L (ref 3.5–5.1)

## 2018-03-16 LAB — SODIUM: Sodium: 134 mmol/L — ABNORMAL LOW (ref 135–145)

## 2018-03-16 MED ORDER — FUROSEMIDE 10 MG/ML IJ SOLN
20.0000 mg | Freq: Every day | INTRAMUSCULAR | Status: DC
  Administered 2018-03-16 – 2018-03-17 (×2): 20 mg via INTRAVENOUS
  Filled 2018-03-16 (×2): qty 2

## 2018-03-16 NOTE — Plan of Care (Signed)
  Problem: Respiratory: Goal: Ability to maintain adequate ventilation will improve Outcome: Progressing Goal: Ability to maintain a clear airway will improve Outcome: Progressing   Problem: Nutrition: Goal: Adequate nutrition will be maintained Outcome: Progressing   Problem: Safety: Goal: Ability to remain free from injury will improve Outcome: Progressing   Problem: Skin Integrity: Goal: Risk for impaired skin integrity will decrease Outcome: Progressing

## 2018-03-16 NOTE — Progress Notes (Addendum)
[facility] [place] NAME: Jill Copeland    MR#:  [id number]  DATE OF BIRTH:  05/21/1931  SUBJECTIVE:  CHIEF COMPLAINT:   Chief Complaint  Patient presents with  . Shortness of Breath  Patient with diminished breath sounds, left arm swelling is improving Demented unable to get any history  REVIEW OF SYSTEMS:    Review of Systems  Unable to perform ROS: Dementia  Unable to obtain review of systems because of her dementia. Patient able to answer some of my questions appropriately but does have baseline memory defects.  So unable to obtain complete review of systems.   DRUG ALLERGIES:   Allergies  Allergen Reactions  . Penicillins Other (See Comments)    Has patient had a PCN reaction causing immediate rash, facial/tongue/throat swelling, SOB or lightheadedness with hypotension: Unknown Has patient had a PCN reaction causing severe rash involving mucus membranes or skin necrosis: Unknown Has patient had a PCN reaction that required hospitalization: Unknown Has patient had a PCN reaction occurring within the last 10 years: Unknown If all of the above answers are "NO", then may proceed with Cephalosporin use.     VITALS:  Blood pressure 130/67, pulse 68, temperature 98.3 F (36.8 C), temperature source Oral, resp. rate (!) 22, height 5\' 5"  (1.651 m), weight 47.4 kg, SpO2 95 %.  PHYSICAL EXAMINATION:   Physical Exam  GENERAL:  84 y.o.-year-old patient lying in the bed with no acute distress.  Patient appears cachectic.  Chronically ill appearing, thin, frail. EYES: Pupils equal, round, reactive to light . No scleral icterus. Extraocular muscles intact.  HEENT: Head atraumatic, normocephalic. Oropharynx and nasopharynx clear.  NECK:  Supple, no jugular venous distention. No thyroid enlargement, no tenderness.  LUNGS: Decreased breath sounds bilaterally,No use of accessory muscles of respiration.  CARDIOVASCULAR: S1, S2 regular,.. No  murmurs, rubs, or gallops.  ABDOMEN: Soft, nontender, nondistended. Bowel sounds present. EXTREMITIES: No cyanosis, clubbing .  Left arm edema noted   , no fluctuation NEUROLOGIC: No focal deficit observed but unable to follow directions for full neuro exam due to her baseline dementia. PSYCHIATRIC: The patient is alert and oriented x 1.  Admission impaired, memory impaired SKIN: No obvious rash, lesion, or ulcer.   LABORATORY PANEL:   CBC Recent Labs  Lab 03/14/18 0509  WBC 8.1  HGB 9.9*  HCT 30.9*  PLT 167   ------------------------------------------------------------------------------------------------------------------ Chemistries  Recent Labs  Lab 03/09/18 1940  03/14/18 0509  03/16/18 0422  NA 162*   < > 140   < > 134*  K  --    < > 3.6  --  3.9  CL  --    < > 108  --   --   CO2  --    < > 26  --   --   GLUCOSE  --    < > 89  --   --   BUN  --    < > 12  --   --   CREATININE  --    < > 0.75  --   --   CALCIUM  --    < > 8.0*  --   --   MG 2.1  --   --   --   --    < > = values in this interval not displayed.   ------------------------------------------------------------------------------------------------------------------  Cardiac Enzymes Recent Labs  Lab 03/10/18 0106  TROPONINI 0.05*   ------------------------------------------------------------------------------------------------------------------  RADIOLOGY:  Dg Chest Port 1 View  Result Date: 03/16/2018 CLINICAL DATA:  84 year old female with cough EXAM: PORTABLE CHEST 1 VIEW COMPARISON:  Prior chest x-ray 03/09/2018 FINDINGS: Left subclavian approach cardiac rhythm maintenance device with leads projecting over the right atrium and right ventricle. Cardiac and mediastinal contours are within normal limits. Atherosclerotic calcifications are present in the transverse aorta. Of note, the patient is rotated toward the left. Patchy airspace opacity largely opacifies the left base but has improved compared to  prior. Background bronchitic changes and interstitial prominence are slightly accentuated compared to previous imaging. No acute osseous abnormality. The known hiatal hernia is not as visible on today's examination. IMPRESSION: 1. Slight accentuation of diffuse interstitial prominence compared to 03/09/2018. Differential considerations include interval development of mild interstitial pulmonary edema versus a developing atypical or viral respiratory infection. 2. Right upper extremity PICC now present with the catheter tip overlying the cavoatrial junction. 3. Improving patchy airspace opacity in the left mid lung. 4. Persistent left basilar airspace opacity likely reflecting a combination of atelectasis and perhaps small effusion. Electronically Signed   By: Jacqulynn Cadet M.D.   On: 03/16/2018 11:34     ASSESSMENT AND PLAN:   84 year old elderly female patient with history of Alzheimer's dementia type 2 diabetes mellitus, esophageal stricture, dysphagia, GERD, hypothyroidism, recurrent pneumonia Under hospitalist service for shortness of breath  -Healthcare associated pneumonia, patient is afebrile, room air sats 100%.   Clinically improving Patient has elevated procalcitonin, leukocytosis which are better Change IV antibiotics cefepime and vancomycin to Omnicef Repeat chest x-ray with possible atypical infection will get respiratory panel and check influenza  -Pulmonary edema with effusions-Lasix IV Check BMP in a.m.  -History of esophageal stricture and dysphagia Speech therapy has recommended pured diet with nectar liquids.  Hypertension blood pressure elevated On Cardizem CD, add lisinopril and titrate as needed  -Dementia at baseline, able to answer some of the questions appropriately.  -Hypernatremia due to dehydration, improved.  -Hypothyroidism Synthroid supplementation   Sinus tachycardia: changed to long-acting Cardizem   Left arm swelling, ultrasound of left arm  negative for DVT study.    Hypokalemia improved after replacement.  Dementia with behavioral disturbances, use Depakote changed from IV to p.o.. Received PICC line because of poor IV access.   Prognosis is poor, seen by palliative care, family still wants full code. Palliative care is talking to the patient's daughter again today  Patient likely be discharged back to nursing home with palliative care team to follow next 1 to 2 days.  All the records are reviewed and case discussed with Care Management/Social Worker. Management plans discussed with the patient, daughter  and they are in agreement.  CODE STATUS: Full code  DVT Prophylaxis: SCDs  TOTAL TIME TAKING CARE OF THIS PATIENT: 35 minutes.   POSSIBLE D/C IN 1-2 DAYS, DEPENDING ON CLINICAL CONDITION.  Nicholes Mango M.D on 03/16/2018 at 12:29 PM  Between 7am to 6pm - Pager - [phone]  After 6pm go to [email or web address] - password [email or web address] [facility]  Office  [phone]  CC: Primary care physician; Murlean Iba, MD  Note: This dictation was prepared with Dragon dictation along with smaller phrase technology. Any transcriptional errors that result from this process are unintentional.

## 2018-03-16 NOTE — Care Management Important Message (Signed)
Important Message  Patient Details  Name: Jill Copeland MRN: [id number] Date of Birth: 1931/07/13   Medicare Important Message Given:  Yes    Elza Rafter, RN 03/16/2018, 1:53 PM

## 2018-03-16 NOTE — Progress Notes (Signed)
Pharmacy Electrolyte Monitoring Consult:  Pharmacy consulted to assist in monitoring and replacing electrolytes in this 84 y.o. female admitted on 03/09/2018 with Shortness of Breath   Labs:  Sodium (mmol/L)  Date Value  03/16/2018 134 (L)   Potassium (mmol/L)  Date Value  03/16/2018 3.9   Magnesium (mg/dL)  Date Value  03/09/2018 2.1   Calcium (mg/dL)  Date Value  03/14/2018 8.0 (L)   Albumin (g/dL)  Date Value  03/09/2018 3.1 (L)    Assessment: 12/19 K 3.2 - 2 runs KCl 12/20 K 2.9  - got 5 of 6 runs of KCL, follow-up level 3.3 Mag level 12/19 - 2.1 12/21 PM- K 3.2 up from 3.1 after receiving KCl 20 mEq total IV 12/22  K 3.6   Scr 0.69 12/23 K 3.2 40 meq IV potassium 12/24 K 3.6 12/26 3.9  Plan: Goals K ~ 4  No potassium replenishment warranted at this time  Will recheck K in AM 12/27. Pharmacy will continue to follow  Lu Duffel, PharmD, BCPS Clinical Pharmacist 03/16/2018 7:31 AM

## 2018-03-16 NOTE — Progress Notes (Signed)
New referral for outpatient Palliative top follow at [facility] received from Palliative NP Benito Mccreedy. CSW Evette Cristal made aware. Patient information faxed to referral. Flo Shanks RN, BSN, [facility] [place], hospital Liaison [phone]

## 2018-03-16 NOTE — Progress Notes (Signed)
Daily Progress Note   Patient Name: Jill Copeland       Date: 03/16/2018 DOB: [birth year]  Age: 84 y.o. MRN#: [id number] Attending Physician: Nicholes Mango, MD Primary Care Physician: Murlean Iba, MD Admit Date: 03/09/2018  Reason for Consultation/Follow-up: Establishing goals of care  Subjective: Patient is a more awake and alert today.  Currently being fed by Nurse Tech and tolerating well. Some noticeable pocketing of food but will swallow with prompting. She is pleasantly confused, and able to answer some questions appropriately. She denies pain. Continues to have congested cough at times.   Daughter, Jill Copeland is at bedside. We discussed previous goals of care conversation. Daughter continues to remain hopeful that her mother will improve and expresses her goal is for her to return to [facility]. Jill Copeland, verbalizes awareness of her mother's condition and continuous signs of cough, congestion, and pneumonia. I provided updates on her recent chest x-ray of possible atypical infection. Daughter verbalized understanding, and stated "mom has pneumonia often, last time it turned into double pneumonia. She is an chronic aspirator and I know she will get a little better. She is showing signs because she is able to answer some of my questions now. She is her normal self!" Also discussed patient was transitioned to oral antibiotics (Omnicef). Daughter verbalized understanding and appreciation. She expressed she was familiar with routine and hopeful her mother will continue on antibiotics and return to facility today or tomorrow. I advised most likely would be tomorrow unless attending felt otherwise. She verbalized understanding and appreciation.   Daughter stated she would be be here visiting with her  mother in town for another week or so.   I attempted to re-discuss patient's full code status. Previously daughter requested some time to see her mother and see how she did. I discussed patient's current full code status with consideration of her co-morbidities and current illness. Daughter verbalized understanding yet she expressed her wishes for her to remain a full code despite awareness of respiratory and overall functional and nutritional state. Daughter continued to express she would address a DNR in the event of an emergency and she could consider what the situation was. Attempted to discuss the difficulty in making a decision during and emergent event due to feelings of overwhelm and fear. Daughter verbalized understanding. She expressed wishes to  continue with full code.   Again I offered hospice versus palliative care outpatient. Daughter politely declines hospice services. She asked good questions, however about differences in care. All questions answered and daughter verbalized wishes to have palliative involved in her mother's care at facility.   Length of Stay: 7  Current Medications: Scheduled Meds:  . cefdinir  300 mg Oral Q12H  . Chlorhexidine Gluconate Cloth  6 each Topical Q0600  . diltiazem  120 mg Oral Daily  . furosemide  20 mg Intravenous Daily  . heparin injection (subcutaneous)  5,000 Units Subcutaneous Q12H  . insulin aspart  0-9 Units Subcutaneous TID WC  . levothyroxine  150 mcg Oral Q0600  . lisinopril  10 mg Oral Daily  . mupirocin ointment  1 application Nasal BID  . sodium chloride flush  10-40 mL Intracatheter Q12H  . valproic acid  250 mg Oral QID    Continuous Infusions: . sodium chloride 500 mL (03/14/18 0535)    PRN Meds: sodium chloride, acetaminophen **OR** acetaminophen, ipratropium-albuterol, ondansetron **OR** ondansetron (ZOFRAN) IV, sodium chloride flush  Physical Exam Vitals signs and nursing note reviewed.  Constitutional:       Appearance: She is cachectic. She is ill-appearing.     Comments: Thin, frail, chronically ill appearing   Cardiovascular:     Rate and Rhythm: Normal rate and regular rhythm.     Pulses: Normal pulses.     Heart sounds: Normal heart sounds.  Pulmonary:     Effort: Pulmonary effort is normal.     Breath sounds: Decreased breath sounds present.     Comments: Congested cough  Abdominal:     General: Abdomen is flat. Bowel sounds are normal.  Skin:    General: Skin is warm and dry.     Findings: Bruising and ecchymosis present.  Neurological:     Mental Status: She is alert. She is confused.     Comments: Alert to self only, hx of dementia   Psychiatric:        Behavior: Behavior is cooperative.        Cognition and Memory: Cognition is impaired. Memory is impaired.        Judgment: Judgment is inappropriate.             Vital Signs: BP 130/67 (BP Location: Left Arm)   Pulse 68   Temp 98.3 F (36.8 C) (Oral)   Resp (!) 22   Ht 5\' 5"  (1.651 m)   Wt 47.4 kg   SpO2 95%   BMI 17.39 kg/m  SpO2: SpO2: 95 % O2 Device: O2 Device: Room Air O2 Flow Rate: O2 Flow Rate (L/min): 2 L/min  Intake/output summary:   Intake/Output Summary (Last 24 hours) at 03/16/2018 1417 Last data filed at 03/16/2018 0300 Gross per 24 hour  Intake -  Output 1250 ml  Net -1250 ml   LBM: Last BM Date: 03/15/18 Baseline Weight: Weight: 40.1 kg Most recent weight: Weight: 47.4 kg       Palliative Assessment/Data: PPS 30%    Flowsheet Rows     Most Recent Value  Intake Tab  Referral Department  Hospitalist  Unit at Time of Referral  ER  Palliative Care Primary Diagnosis  Pulmonary  Date Notified  03/09/18  Palliative Care Type  Return patient Palliative Care  Reason for referral  Clarify Goals of Care  Date of Admission  03/09/18  Date first seen by Palliative Care  03/10/18  # of days Palliative referral response time  1 Day(s)  # of days IP prior to Palliative referral  0  Clinical  Assessment  Psychosocial & Spiritual Assessment  Palliative Care Outcomes      Patient Active Problem List   Diagnosis Date Noted  . Acute respiratory failure ([place]) 03/09/2018  . Pressure injury of skin 03/09/2018  . Sepsis ([place]) 04/08/2017  . A-fib ([place]) 04/08/2017  . Asthma 03/11/2017  . Pain management 03/11/2017  . Advanced care planning/counseling discussion 11/19/2016  . Impaired mobility and ADLs 11/19/2016  . Adventitious breath sounds 11/19/2016  . Pacemaker infection ([place]) 10/11/2016  . Endocarditis 10/09/2016  . Schizoaffective disorder ([place])   . Pressure ulcer 01/13/2015  . Multiple falls 01/12/2015  . Overactive bladder 12/30/2014  . Alzheimer disease ([place])   . Schizo affective schizophrenia ([place])   . Recurrent pneumonia   . Dysphagia causing pulmonary aspiration with swallowing   . Pneumococcal pneumonia ([place])   . Arrhythmia   . Esophageal stricture   . Hypothyroidism   . Lung nodule   . Dementia in chronic schizophrenia ([place]) 08/12/2014  . Social discord 08/12/2014  . Family conflict [id number]/[id number]    Palliative Care Assessment & Plan   Patient Profile: 84 y.o. female admitted on 03/09/2018 from [facility] with shortness of breath. She has a past medical history significant for these, Alzheimer's disease, dysphasia, esophageal stricture, GERD, recurrent aspiration pneumonia, hiatal hernia, hypothyroidism, schizophrenia, CVA, recurrent UTIs.  She presented to the ED with cough and decreased oxygen saturations in the 70s.  She was initiated on 4 L of oxygen via nasal cannula later requiring high flow oxygen at 10 L.  Patient unable to provide medical history due to dementia.  X-ray showed large hiatal hernia, left pleural effusion, and left lower lobe airspace opacities.  This admission patient continues on IV vancomycin and cefepime post cultures.  Was seen by speech therapy with recommendations to continue with dysphagia diet with known esophageal stricture.   The medicine team consulted for goals of care.  Recommendations/Plan:  Full Code-as requested by daughter  Continue to treat the treatable.   Daughter expressed wishes to return to [facility] once medically appropriate for discharge with outpatient Palliative support.   CSW referral for outpatient Palliative at discharge.   PMT will continue to support and follow as needed.   Goals of Care and Additional Recommendations:  Limitations on Scope of Treatment: Full Scope Treatment  Code Status:    Code Status Orders  (From admission, onward)         Start     Ordered   03/09/18 1357  Full code  Continuous     03/09/18 1356        Code Status History    Date Active Date Inactive Code Status Order ID Comments User Context   04/08/2017 1800 04/11/2017 2015 Full Code [id number]  Hillary Bow, MD ED   10/09/2016 1605 10/14/2016 2001 Full Code [id number]  Bettey Costa, MD Inpatient   10/02/2016 1627 10/05/2016 1653 Full Code [id number]  Fritzi Mandes, MD Inpatient   01/12/2015 2233 01/15/2015 0009 Full Code [id number]  Hower, Aaron Mose, MD ED   12/30/2014 1637 01/02/2015 2100 DNR [id number]  Erick Colace, NP Inpatient   12/30/2014 1153 12/30/2014 1634 Full Code [id number]  Janece Canterbury, MD Inpatient      Prognosis:   Guarded to Poor   Discharge Planning:  Per Daughter back to [facility] with outpatient palliative support.   Care plan was discussed with patient's daughter, bedside RN, CSW,  and Dr. Margaretmary Eddy.   Thank you for allowing the Palliative Medicine Team to assist in the care of this patient.   Total Time 45 min.  Prolonged Time Billed  NO       Greater than 50%  of this time was spent counseling and coordinating care related to the above assessment and plan.  Alda Lea, AGPCNP-BC Palliative Medicine Team  Pager: [phone] Amion: Bjorn Pippin   Please contact Palliative Medicine Team phone at [phone] for questions and concerns.

## 2018-03-16 NOTE — Clinical Social Work Note (Signed)
CSW received consult that palliative needs to follow patient at [facility] where she is a long term care resident.  Referral made to [facility] [place] [place].  Jones Broom. Norval Morton, MSW, [place]  03/16/2018 4:41 PM

## 2018-03-17 LAB — CBC WITH DIFFERENTIAL/PLATELET
Abs Immature Granulocytes: 1.34 10*3/uL — ABNORMAL HIGH (ref 0.00–0.07)
Basophils Absolute: 0.1 10*3/uL (ref 0.0–0.1)
Basophils Relative: 1 %
Eosinophils Absolute: 0.2 10*3/uL (ref 0.0–0.5)
Eosinophils Relative: 2 %
HCT: 36.1 % (ref 36.0–46.0)
Hemoglobin: 11.6 g/dL — ABNORMAL LOW (ref 12.0–15.0)
Immature Granulocytes: 12 %
Lymphocytes Relative: 23 %
Lymphs Abs: 2.6 10*3/uL (ref 0.7–4.0)
MCH: 27.3 pg (ref 26.0–34.0)
MCHC: 32.1 g/dL (ref 30.0–36.0)
MCV: 84.9 fL (ref 80.0–100.0)
Monocytes Absolute: 1 10*3/uL (ref 0.1–1.0)
Monocytes Relative: 9 %
Neutro Abs: 5.9 10*3/uL (ref 1.7–7.7)
Neutrophils Relative %: 53 %
Platelets: 357 10*3/uL (ref 150–400)
RBC: 4.25 MIL/uL (ref 3.87–5.11)
RDW: 16.9 % — ABNORMAL HIGH (ref 11.5–15.5)
Smear Review: NORMAL
WBC: 11.6 10*3/uL — ABNORMAL HIGH (ref 4.0–10.5)
nRBC: 0 % (ref 0.0–0.2)

## 2018-03-17 LAB — BASIC METABOLIC PANEL
Anion gap: 7 (ref 5–15)
BUN: 14 mg/dL (ref 8–23)
CO2: 30 mmol/L (ref 22–32)
Calcium: 8.6 mg/dL — ABNORMAL LOW (ref 8.9–10.3)
Chloride: 98 mmol/L (ref 98–111)
Creatinine, Ser: 0.81 mg/dL (ref 0.44–1.00)
GFR calc Af Amer: >60 mL/min (ref >60)
GFR calc non Af Amer: >60 mL/min (ref >60)
Glucose, Bld: 108 mg/dL — ABNORMAL HIGH (ref 70–99)
Potassium: 4.2 mmol/L (ref 3.5–5.1)
SODIUM: 135 mmol/L (ref 135–145)

## 2018-03-17 LAB — GLUCOSE, CAPILLARY
Glucose-Capillary: 101 mg/dL — ABNORMAL HIGH (ref 70–99)
Glucose-Capillary: 113 mg/dL — ABNORMAL HIGH (ref 70–99)

## 2018-03-17 LAB — PATHOLOGIST SMEAR REVIEW

## 2018-03-17 MED ORDER — LISINOPRIL 10 MG PO TABS: 10.0000 mg | ORAL_TABLET | Freq: Every day | ORAL | Status: AC

## 2018-03-17 MED ORDER — LEVOTHYROXINE SODIUM 150 MCG PO TABS: 150.0000 ug | ORAL_TABLET | Freq: Every day | ORAL | Status: AC

## 2018-03-17 MED ORDER — TRAMADOL HCL 50 MG PO TABS: 50.0000 mg | ORAL_TABLET | ORAL | 0 refills | Status: AC | PRN

## 2018-03-17 MED ORDER — CEFDINIR 300 MG PO CAPS: 300.0000 mg | ORAL_CAPSULE | Freq: Two times a day (BID) | ORAL | 0 refills | Status: AC

## 2018-03-17 MED ORDER — LORAZEPAM 0.5 MG PO TABS: 0.2500 mg | ORAL_TABLET | Freq: Every day | ORAL | 0 refills | Status: AC

## 2018-03-17 MED ORDER — DILTIAZEM HCL ER COATED BEADS 120 MG PO CP24: 120.0000 mg | ORAL_CAPSULE | Freq: Every day | ORAL | Status: AC

## 2018-03-17 MED ORDER — MUPIROCIN 2 % EX OINT: 1.0000 "application " | TOPICAL_OINTMENT | Freq: Two times a day (BID) | CUTANEOUS | 0 refills | Status: AC

## 2018-03-17 NOTE — Discharge Summary (Signed)
[facility] [place] NAME: Jill Copeland    MR#:  [id number]  DATE OF BIRTH:  09-11-1931  DATE OF ADMISSION:  03/09/2018 ADMITTING PHYSICIAN: Dustin Flock, MD  DATE OF DISCHARGE:  03/17/18  PRIMARY CARE PHYSICIAN: Murlean Iba, MD    ADMISSION DIAGNOSIS:  Shortness of breath [R06.02]  DISCHARGE DIAGNOSIS:  Active Problems:   Acute respiratory failure (HCC)   Pressure injury of skin   SECONDARY DIAGNOSIS:   Past Medical History:  Diagnosis Date  . Alzheimer disease ([place])   . Arthritis   . Bacteremia    a. 09/2016 - admit w/ S hominis bacteremia w/ ? of pacer lead vegetation;  b. 09/2016 TEE:  unable to fully pass TEE probe due to esoph stricture; c. IV Abx per ID.  . Diabetes mellitus without complication ([place])   . Dysphagia causing pulmonary aspiration with swallowing   . Esophageal stricture    a. @ site of anastamosis from hiatal hernia surgery.  Marland Kitchen GERD (gastroesophageal reflux disease)   . Hiatal hernia    a. s/p prior repair with fistula;  b. 09/2016 CT chest: moderate to large hiatal hernia - stable.  . Hypothyroidism   . Pneumococcal pneumonia ([place])   . Recurrent pneumonia   . Recurrent UTI   . Schizo affective schizophrenia ([place])   . Stroke (cerebrum) ([place])   . Symptomatic bradycardia    a. s/p biotronik PPM 02/2005 at [facility] in [place], [place]  . Syncope    a. 02/2005 s/p Biotronik DC PPM @ [facility] in [place], [place] COURSE:  HISTORY OF PRESENT ILLNESS: Jill Copeland  is a 84 y.o. female with a known history of Alzheimer's disease, diabetes type 2, history of dysphasia, esophageal stricture, GERD, hypothyroidism, recurrent pneumonia who [facility] with complaint of cough oxygen saturations in the 70s.  Patient had to be placed on 4 L of oxygen.  Here she is requiring high flow oxygen at 10 L.  Patient with dementia unable to provide any review of systems.  Chest x-ray  showed large hiatal hernia as well as left effusion and left lower lobe airspace opacities  Healthcare associated pneumonia with a new diagnosis of RSV , patient is afebrile, room air sats 100%.   Clinically improving Patient has elevated procalcitonin, leukocytosis which are better Change IV antibiotics cefepime and vancomycin to Omnicef Repeat chest x-ray with possible atypical infection -respiratory panel positive for RSV and flu panel is negative  Continue symptomatic treatment   -Pulmonary edema with effusions-secondary to atypical infection from respiratory syncytial virus Bronchodilator treatments Lasix p.o. for next 3 days Isolation not needed   -History of esophageal stricture and dysphagia Speech therapy has recommended pured diet with nectar liquids.  Hypertension blood pressure elevated On Cardizem CD, add lisinopril and titrate as needed  -Dementia at baseline, able to answer some of the questions appropriately.  -Hypernatremia due to dehydration, improved.  -Hypothyroidism Synthroid supplementation   Sinus tachycardia: changed to long-acting Cardizem   Left arm swelling, ultrasound of left arm negative for DVT study.    Hypokalemia improved after replacement.  Dementia with behavioral disturbances, use Depakote changed from IV to p.o.. Received PICC line because of poor IV access.  Will be removed prior to discharge   Prognosis is poor, seen by palliative care, family still wants full code. Call placed to patient's daughter Lattie Haw at [phone] to give an update She did not answer  the phone and voicemail box is full DISCHARGE CONDITIONS:   fair  CONSULTS OBTAINED:     PROCEDURES none   DRUG ALLERGIES:   Allergies  Allergen Reactions  . Penicillins Other (See Comments)    Has patient had a PCN reaction causing immediate rash, facial/tongue/throat swelling, SOB or lightheadedness with hypotension: Unknown Has patient had a PCN  reaction causing severe rash involving mucus membranes or skin necrosis: Unknown Has patient had a PCN reaction that required hospitalization: Unknown Has patient had a PCN reaction occurring within the last 10 years: Unknown If all of the above answers are "NO", then may proceed with Cephalosporin use.     DISCHARGE MEDICATIONS:   Allergies as of 03/17/2018      Reactions   Penicillins Other (See Comments)   Has patient had a PCN reaction causing immediate rash, facial/tongue/throat swelling, SOB or lightheadedness with hypotension: Unknown Has patient had a PCN reaction causing severe rash involving mucus membranes or skin necrosis: Unknown Has patient had a PCN reaction that required hospitalization: Unknown Has patient had a PCN reaction occurring within the last 10 years: Unknown If all of the above answers are "NO", then may proceed with Cephalosporin use.      Medication List    STOP taking these medications   azithromycin 500 MG tablet Commonly known as:  ZITHROMAX   OLANZapine 10 MG tablet Commonly known as:  ZYPREXA     TAKE these medications   albuterol 108 (90 Base) MCG/ACT inhaler Commonly known as:  PROVENTIL HFA;VENTOLIN HFA Inhale 2 puffs into the lungs every 6 (six) hours as needed for wheezing or shortness of breath.   ARTHRITIS PAIN RELIEF 650 MG CR tablet Generic drug:  acetaminophen Take 1 tablet by mouth every 6 (six) hours as needed.   ascorbic acid 500 MG tablet Commonly known as:  VITAMIN C Take 250 mg by mouth 2 (two) times daily.   cefdinir 300 MG capsule Commonly known as:  OMNICEF Take 1 capsule (300 mg total) by mouth every 12 (twelve) hours.   Cholecalciferol 25 MCG (1000 UT) tablet Take 1,000 Units by mouth daily.   diltiazem 120 MG 24 hr capsule Commonly known as:  CARDIZEM CD Take 1 capsule (120 mg total) by mouth daily. Start taking on:  March 18, 2018   divalproex 250 MG 24 hr tablet Commonly known as:  DEPAKOTE ER Take 1  tablet (250 mg total) by mouth daily. What changed:    how much to take  when to take this   docusate sodium 100 MG capsule Commonly known as:  COLACE Take 100 mg by mouth 2 (two) times daily.   feeding supplement (PRO-STAT SUGAR FREE 64) Liqd Take 30 mLs by mouth 3 (three) times daily with meals.   ferrous sulfate 325 (65 FE) MG tablet Take 325 mg by mouth daily with breakfast.   fluPHENAZine 5 MG tablet Commonly known as:  PROLIXIN Take 7.5 mg by mouth 2 (two) times daily.   fluticasone 50 MCG/ACT nasal spray Commonly known as:  FLONASE Place 2 sprays into both nostrils daily.   Fluticasone-Salmeterol 250-50 MCG/DOSE Aepb Commonly known as:  ADVAIR DISKUS Inhale 1 puff into the lungs 2 (two) times daily.   furosemide 20 MG tablet Commonly known as:  LASIX Take 1 tablet (20 mg total) by mouth every other day.   gabapentin 100 MG capsule Commonly known as:  NEURONTIN Take 2 capsules (200 mg total) by mouth 3 (three) times daily.  insulin aspart 100 UNIT/ML injection Commonly known as:  novoLOG Inject 0-9 Units into the skin 3 (three) times daily with meals.   ipratropium-albuterol 0.5-2.5 (3) MG/3ML Soln Commonly known as:  DUONEB Take 3 mLs by nebulization every 6 (six) hours as needed.   levothyroxine 150 MCG tablet Commonly known as:  SYNTHROID, LEVOTHROID Take 1 tablet (150 mcg total) by mouth daily at 6 (six) AM. Start taking on:  March 18, 2018 What changed:    medication strength  how much to take  when to take this   lisinopril 10 MG tablet Commonly known as:  PRINIVIL,ZESTRIL Take 1 tablet (10 mg total) by mouth daily. Start taking on:  March 18, 2018   loratadine 10 MG tablet Commonly known as:  CLARITIN Take 10 mg by mouth daily.   LORazepam 0.5 MG tablet Commonly known as:  ATIVAN Take 0.5 tablets (0.25 mg total) by mouth daily. What changed:  Another medication with the same name was removed. Continue taking this medication, and  follow the directions you see here.   Melatonin 3 MG Tabs Take 3 mg by mouth at bedtime.   memantine 5 MG tablet Commonly known as:  NAMENDA Take 1 tablet (5 mg total) by mouth 2 (two) times daily.   metoprolol tartrate 25 MG tablet Commonly known as:  LOPRESSOR Take 0.5 tablets (12.5 mg total) by mouth 2 (two) times daily.   multivitamin with minerals Tabs tablet Take 1 tablet by mouth daily.   mupirocin ointment 2 % Commonly known as:  BACTROBAN Place 1 application into the nose 2 (two) times daily for 3 days.   nitrofurantoin 100 MG capsule Commonly known as:  MACRODANTIN Take 100 mg by mouth 2 (two) times daily.   ondansetron 8 MG tablet Commonly known as:  ZOFRAN Take 1 tablet (8 mg total) by mouth every 8 (eight) hours as needed for nausea or vomiting.   potassium chloride 10 MEQ tablet Commonly known as:  K-DUR,KLOR-CON Take 1 tablet (10 mEq total) by mouth every other day.   saccharomyces boulardii 250 MG capsule Commonly known as:  FLORASTOR Take 1 capsule (250 mg total) by mouth 2 (two) times daily.   senna 8.6 MG Tabs tablet Commonly known as:  SENOKOT Take 1 tablet by mouth 2 (two) times a week. Mondays and Fridays   sertraline 50 MG tablet Commonly known as:  ZOLOFT Take 1 tablet (50 mg total) by mouth at bedtime. What changed:    how much to take  when to take this   sodium chloride 1 g tablet Take 1 g by mouth 3 (three) times daily.   traMADol 50 MG tablet Commonly known as:  ULTRAM Take 1 tablet (50 mg total) by mouth every 4 (four) hours as needed.        DISCHARGE INSTRUCTIONS:   Follow up with primary care physician at the facility in 3 to 5 days Outpatient palliative care follow-up in 3 to 5 days   DIET:  Dysphagia level 1 (PUREE) w/ NECTAR liquids; aspiration precautions; RELFUX precautions. Pills in Puree - Crushed as needed/able to. Feeding assistance w/ ALL meals  DISCHARGE CONDITION:  Fair  ACTIVITY:  Activity as  tolerated  OXYGEN:  Home Oxygen: No.   Oxygen Delivery: room air  DIS[facility]    If you experience worsening of your admission symptoms, develop shortness of breath, life threatening emergency, suicidal or homicidal thoughts you must seek medical attention immediately by calling 911 or calling  your MD immediately  if symptoms less severe.  You Must read complete instructions/literature along with all the possible adverse reactions/side effects for all the Medicines you take and that have been prescribed to you. Take any new Medicines after you have completely understood and accpet all the possible adverse reactions/side effects.   Please note  You were cared for by a hospitalist during your hospital stay. If you have any questions about your discharge medications or the care you received while you were in the hospital after you are discharged, you can call the unit and asked to speak with the hospitalist on call if the hospitalist that took care of you is not available. Once you are discharged, your primary care physician will handle any further medical issues. Please note that NO REFILLS for any discharge medications will be authorized once you are discharged, as it is imperative that you return to your primary care physician (or establish a relationship with a primary care physician if you do not have one) for your aftercare needs so that they can reassess your need for medications and monitor your lab values.     Today  Chief Complaint  Patient presents with  . Shortness of Breath   Patient is doing okay.  No overnight incidents per nurses report.  Okay to discharge patient.  Could not reach patient's daughter  ROS: Limited . RESPIRATORY: Denies cough, wheeze, shortness of breath.  CARDIOVASCULAR: Denies chest pain, palpitations, edema.  GASTROINTESTINAL: Denies nausea, vomiting, diarrhea, abdominal pain.  MUSCULOSKELETAL: Denies pain  PSYCHIATRIC: Denies  anxiety or depressive symptoms.   VITAL SIGNS:  Blood pressure (!) 147/84, pulse 82, temperature 97.8 F (36.6 C), temperature source Oral, resp. rate 18, height 5\' 5"  (1.651 m), weight 47.4 kg, SpO2 90 %.  I/O:    Intake/Output Summary (Last 24 hours) at 03/17/2018 1223 Last data filed at 03/17/2018 1217 Gross per 24 hour  Intake -  Output 550 ml  Net -550 ml    PHYSICAL EXAMINATION:  GENERAL:  84 y.o.-year-old patient lying in the bed with no acute distress.  EYES: Pupils equal, round, reactive to light and accommodation. No scleral icterus. Extraocular muscles intact.  HEENT: Head atraumatic, normocephalic. Oropharynx and nasopharynx clear.  NECK:  Supple, no jugular venous distention. No thyroid enlargement, no tenderness.  LUNGS: mod  breath sounds bilaterally, no wheezing, rales,rhonchi or crepitation. No use of accessory muscles of respiration.  CARDIOVASCULAR: S1, S2 normal. No murmurs, rubs, or gallops.  ABDOMEN: Soft, non-tender, non-distended. Bowel sounds present.  EXTREMITIES: No pedal edema, cyanosis, or clubbing.  NEUROLOGIC: Awake, alert and disoriented sensation intact. Gait not checked.  PSYCHIATRIC: The patient is alert SKIN: No obvious rash, lesion, or ulcer.   DATA REVIEW:   CBC Recent Labs  Lab 03/17/18 0550  WBC 11.6*  HGB 11.6*  HCT 36.1  PLT 357    Chemistries  Recent Labs  Lab 03/17/18 0550  NA 135  K 4.2  CL 98  CO2 30  GLUCOSE 108*  BUN 14  CREATININE 0.81  CALCIUM 8.6*    Cardiac Enzymes No results for input(s): TROPONINI in the last 168 hours.  Microbiology Results  Results for orders placed or performed during the hospital encounter of 03/09/18  Blood Culture (routine x 2)     Status: None   Collection Time: 03/09/18 10:29 AM  Result Value Ref Range Status   Specimen Description BLOOD RIGHT [facility]  Final   Special Requests   Final    BOTTLES  DRAWN AEROBIC AND ANAEROBIC Blood Culture results may not be optimal due to an  inadequate volume of blood received in culture bottles   Culture   Final    NO GROWTH 5 DAYS Performed at [facility], [address] [place], [place] [address]    Report Status 03/14/2018 FINAL  Final  Blood Culture (routine x 2)     Status: None   Collection Time: 03/09/18 10:29 AM  Result Value Ref Range Status   Specimen Description BLOOD LEFT WRIST  Final   Special Requests   Final    BOTTLES DRAWN AEROBIC AND ANAEROBIC Blood Culture results may not be optimal due to an inadequate volume of blood received in culture bottles   Culture   Final    NO GROWTH 5 DAYS Performed at [facility], [address] [place], [place] [address]    Report Status 03/14/2018 FINAL  Final  Urine culture     Status: None   Collection Time: 03/09/18 10:29 AM  Result Value Ref Range Status   Specimen Description   Final    URINE, RANDOM Performed at [facility], [address] [place], [place] [address]    Special Requests   Final    NONE Performed at [facility], [address] [place], [place] [address]    Culture   Final    NO GROWTH Performed at [facility], [place] [address], [place], [place] [address]    Report Status 03/10/2018 FINAL  Final  MRSA PCR Screening     Status: Abnormal   Collection Time: 03/09/18  2:50 PM  Result Value Ref Range Status   MRSA by PCR POSITIVE (A) NEGATIVE Final    Comment:        The GeneXpert MRSA Assay (FDA approved for NASAL specimens only), is one component of a comprehensive MRSA colonization surveillance program. It is not intended to diagnose MRSA infection nor to guide or monitor treatment for MRSA infections. RESULT CALLED TO, READ BACK BY AND VERIFIED WITH: PHYLIS KING @1845  03/09/18 AKT Performed at [facility], [address] [place], [place] [address]   Respiratory Panel by PCR     Status: Abnormal   Collection Time: 03/16/18  3:43 PM  Result Value Ref Range Status    Adenovirus NOT DETECTED NOT DETECTED Final   Coronavirus 229E NOT DETECTED NOT DETECTED Final   Coronavirus HKU1 NOT DETECTED NOT DETECTED Final   Coronavirus NL63 NOT DETECTED NOT DETECTED Final   Coronavirus OC43 NOT DETECTED NOT DETECTED Final   Metapneumovirus NOT DETECTED NOT DETECTED Final   Rhinovirus / Enterovirus NOT DETECTED NOT DETECTED Final   Influenza A NOT DETECTED NOT DETECTED Final   Influenza B NOT DETECTED NOT DETECTED Final   Parainfluenza Virus 1 NOT DETECTED NOT DETECTED Final   Parainfluenza Virus 2 NOT DETECTED NOT DETECTED Final   Parainfluenza Virus 3 NOT DETECTED NOT DETECTED Final   Parainfluenza Virus 4 NOT DETECTED NOT DETECTED Final   Respiratory Syncytial Virus DETECTED (A) NOT DETECTED Final    Comment: CRITICAL RESULT CALLED TO, READ BACK BY AND VERIFIED WITH: D MOLESON RN [facility]) 03/16/18 2202 JDW    Bordetella pertussis NOT DETECTED NOT DETECTED Final   Chlamydophila pneumoniae NOT DETECTED NOT DETECTED Final   Mycoplasma pneumoniae NOT DETECTED NOT DETECTED Final    Comment: Performed at [facility], [place] [address] [place], [place] [address]    RADIOLOGY:  US Venous Img  Upper Uni Left  Result Date: 03/14/2018 CLINICAL DATA:  Swelling EXAM: LEFT UPPER EXTREMITY VENOUS DOPPLER ULTRASOUND TECHNIQUE: Gray-scale sonography with graded compression, as well as color Doppler and duplex ultrasound were performed to evaluate the upper extremity deep venous system from the level of the subclavian vein and including the jugular, axillary, basilic, radial, ulnar and upper cephalic vein. Spectral Doppler was utilized to evaluate flow at rest and with distal augmentation maneuvers. COMPARISON:  None. FINDINGS: Contralateral Subclavian Vein: Respiratory phasicity is normal and symmetric with the symptomatic side. No evidence of thrombus. Normal compressibility. Internal Jugular Vein: No evidence of thrombus. Normal compressibility, respiratory phasicity and  response to augmentation. Subclavian Vein: No evidence of thrombus. Normal compressibility, respiratory phasicity and response to augmentation. Axillary Vein: No evidence of thrombus. Normal compressibility, respiratory phasicity and response to augmentation. Cephalic Vein: No evidence of thrombus. Normal compressibility, respiratory phasicity and response to augmentation. Basilic Vein: No evidence of thrombus. Normal compressibility, respiratory phasicity and response to augmentation. Brachial Veins: No evidence of thrombus. Normal compressibility, respiratory phasicity and response to augmentation. Radial Veins: No evidence of thrombus. Normal compressibility, respiratory phasicity and response to augmentation. Ulnar Veins: No evidence of thrombus. Normal compressibility, respiratory phasicity and response to augmentation. Venous Reflux:  None visualized. Other Findings:  None visualized. IMPRESSION: No evidence of DVT within the left upper extremity. Electronically Signed   By: Rolm Baptise M.D.   On: 03/14/2018 10:44   Dg Chest Port 1 View  Result Date: 03/16/2018 CLINICAL DATA:  84 year old female with cough EXAM: PORTABLE CHEST 1 VIEW COMPARISON:  Prior chest x-ray 03/09/2018 FINDINGS: Left subclavian approach cardiac rhythm maintenance device with leads projecting over the right atrium and right ventricle. Cardiac and mediastinal contours are within normal limits. Atherosclerotic calcifications are present in the transverse aorta. Of note, the patient is rotated toward the left. Patchy airspace opacity largely opacifies the left base but has improved compared to prior. Background bronchitic changes and interstitial prominence are slightly accentuated compared to previous imaging. No acute osseous abnormality. The known hiatal hernia is not as visible on today's examination. IMPRESSION: 1. Slight accentuation of diffuse interstitial prominence compared to 03/09/2018. Differential considerations include  interval development of mild interstitial pulmonary edema versus a developing atypical or viral respiratory infection. 2. Right upper extremity PICC now present with the catheter tip overlying the cavoatrial junction. 3. Improving patchy airspace opacity in the left mid lung. 4. Persistent left basilar airspace opacity likely reflecting a combination of atelectasis and perhaps small effusion. Electronically Signed   By: Jacqulynn Cadet M.D.   On: 03/16/2018 11:34    EKG:   Orders placed or performed during the hospital encounter of 03/09/18  . EKG 12-Lead  . EKG 12-Lead  . EKG 12-Lead  . EKG 12-Lead  . EKG 12-Lead  . EKG 12-Lead  . EKG 12-Lead  . EKG 12-Lead  . EKG 12-Lead  . EKG 12-Lead      Management plans discussed with the patient, family and they are in agreement.  CODE STATUS:     Code Status Orders  (From admission, onward)         Start     Ordered   03/09/18 1357  Full code  Continuous     03/09/18 1356        Code Status History    Date Active Date Inactive Code Status Order ID Comments User Context   04/08/2017 1800 04/11/2017 2015 Full Code [id number]  Hillary Bow, MD ED  10/09/2016 1605 10/14/2016 2001 Full Code [id number]  Bettey Costa, MD Inpatient   10/02/2016 1627 10/05/2016 1653 Full Code [id number]  Fritzi Mandes, MD Inpatient   01/12/2015 2233 01/15/2015 0009 Full Code [id number]  Lytle Butte, MD ED   12/30/2014 1637 01/02/2015 2100 DNR [id number]  Erick Colace, NP Inpatient   12/30/2014 1153 12/30/2014 1634 Full Code [id number]  Janece Canterbury, MD Inpatient      TOTAL TIME TAKING CARE OF THIS PATIENT: 45 minutes.   Note: This dictation was prepared with Dragon dictation along with smaller phrase technology. Any transcriptional errors that result from this process are unintentional.   @[facility] @  on 03/17/2018 at 12:23 PM  Between 7am to 6pm - Pager - [phone]  After 6pm go to [email or web address] - password [email or web address] [place] [facility]   Office  [phone]  CC: Primary care physician; Murlean Iba, MD

## 2018-03-17 NOTE — Plan of Care (Signed)
  Problem: Nutrition: Goal: Adequate nutrition will be maintained Outcome: Progressing   Problem: Skin Integrity: Goal: Risk for impaired skin integrity will decrease Outcome: Progressing

## 2018-03-17 NOTE — Clinical Social Work Note (Addendum)
Patient to be d/c'ed today to [facility] room 207B.  Patient and family agreeable to plans will transport via ems RN to call report [phone].  CSW spoke with patient's daughter Colen Darling, [phone], she is aware that patient is discharging today.  Palliative to follow patient at [facility] [place] and [facility] nurse liaison aware of referral.    Evette Cristal, MSW, Latanya Presser [phone]

## 2018-03-17 NOTE — Progress Notes (Signed)
Pharmacy Electrolyte Monitoring Consult:  Pharmacy consulted to assist in monitoring and replacing electrolytes in this 84 y.o. female admitted on 03/09/2018 with Shortness of Breath   Labs:  Sodium (mmol/L)  Date Value  03/17/2018 135   Potassium (mmol/L)  Date Value  03/17/2018 4.2   Magnesium (mg/dL)  Date Value  03/09/2018 2.1   Calcium (mg/dL)  Date Value  03/17/2018 8.6 (L)   Albumin (g/dL)  Date Value  03/09/2018 3.1 (L)    Assessment: 12/19 K 3.2 - 2 runs KCl 12/20 K 2.9  - got 5 of 6 runs of KCL, follow-up level 3.3 Mag level 12/19 - 2.1 12/21 PM- K 3.2 up from 3.1 after receiving KCl 20 mEq total IV 12/22  K 3.6   Scr 0.69 12/23 K 3.2 40 meq IV potassium 12/24 K 3.6 12/26 3.9  Plan: Goals K ~ 4  No potassium replenishment warranted at this time  Patient is at goal x 2 days without dosing adjustment or replinishment.  Pharmacy will sign off at this time.  Lu Duffel, PharmD, BCPS Clinical Pharmacist 03/17/2018 7:30 AM

## 2018-03-17 NOTE — Discharge Instructions (Signed)
Follow up with primary care physician at the facility in 3 to 5 days Outpatient palliative care follow-up in 3 to 5 days

## 2018-03-18 DIAGNOSIS — R2689 Other abnormalities of gait and mobility: Secondary | ICD-10-CM | POA: Diagnosis not present

## 2018-03-22 DEATH — deceased

## 2019-07-20 IMAGING — CR DG CHEST 2V
1 series · 2 of 2 positions shown · non-contrast
Comparison: 02/11/2017 chest x-ray.  Chest CT 10/11/2016

CLINICAL DATA: Cough and wheeze.

EXAM:
CHEST  2 VIEW

[Series 1: dg chest 2 view · 0.14mm/px · 2 of 2 slices shown]
[im 1/2]
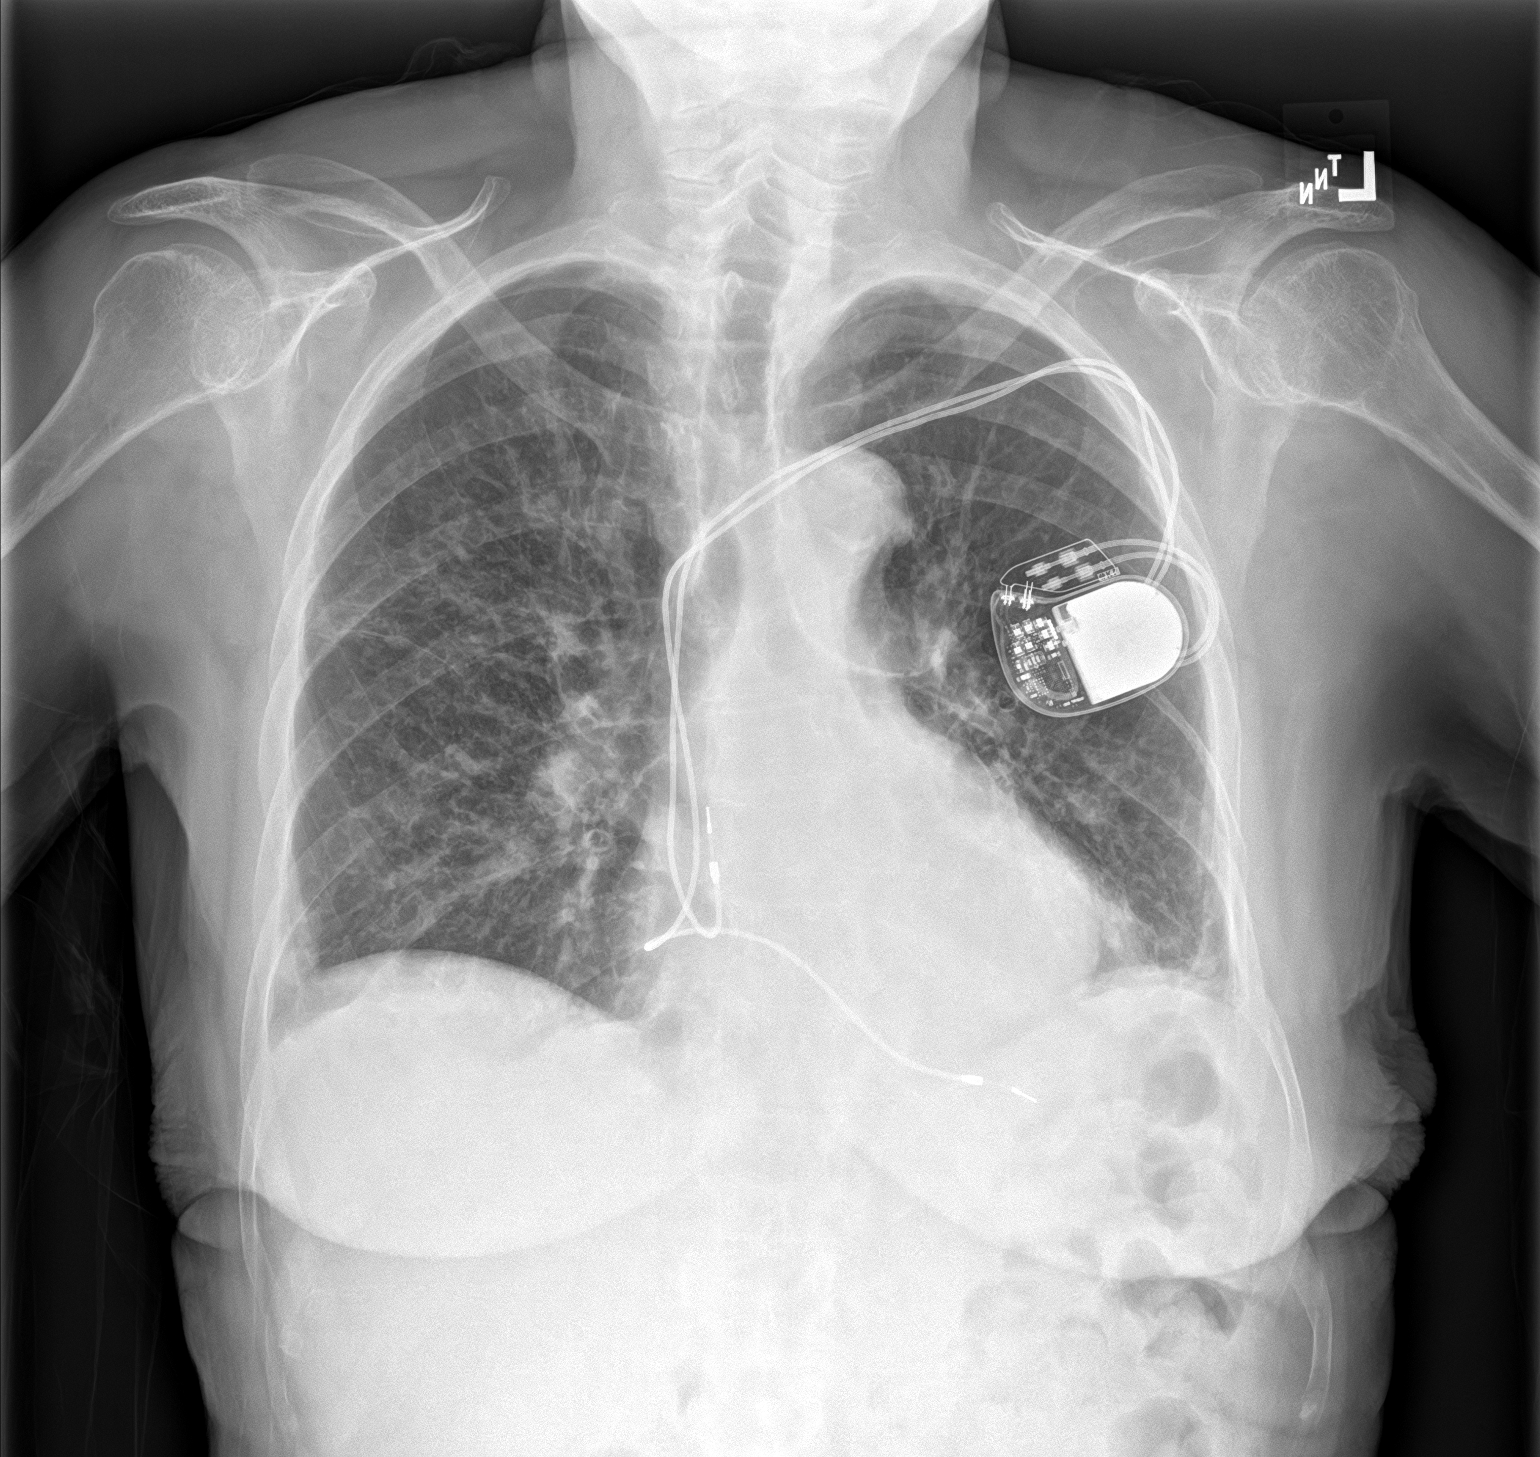
[im 2/2]
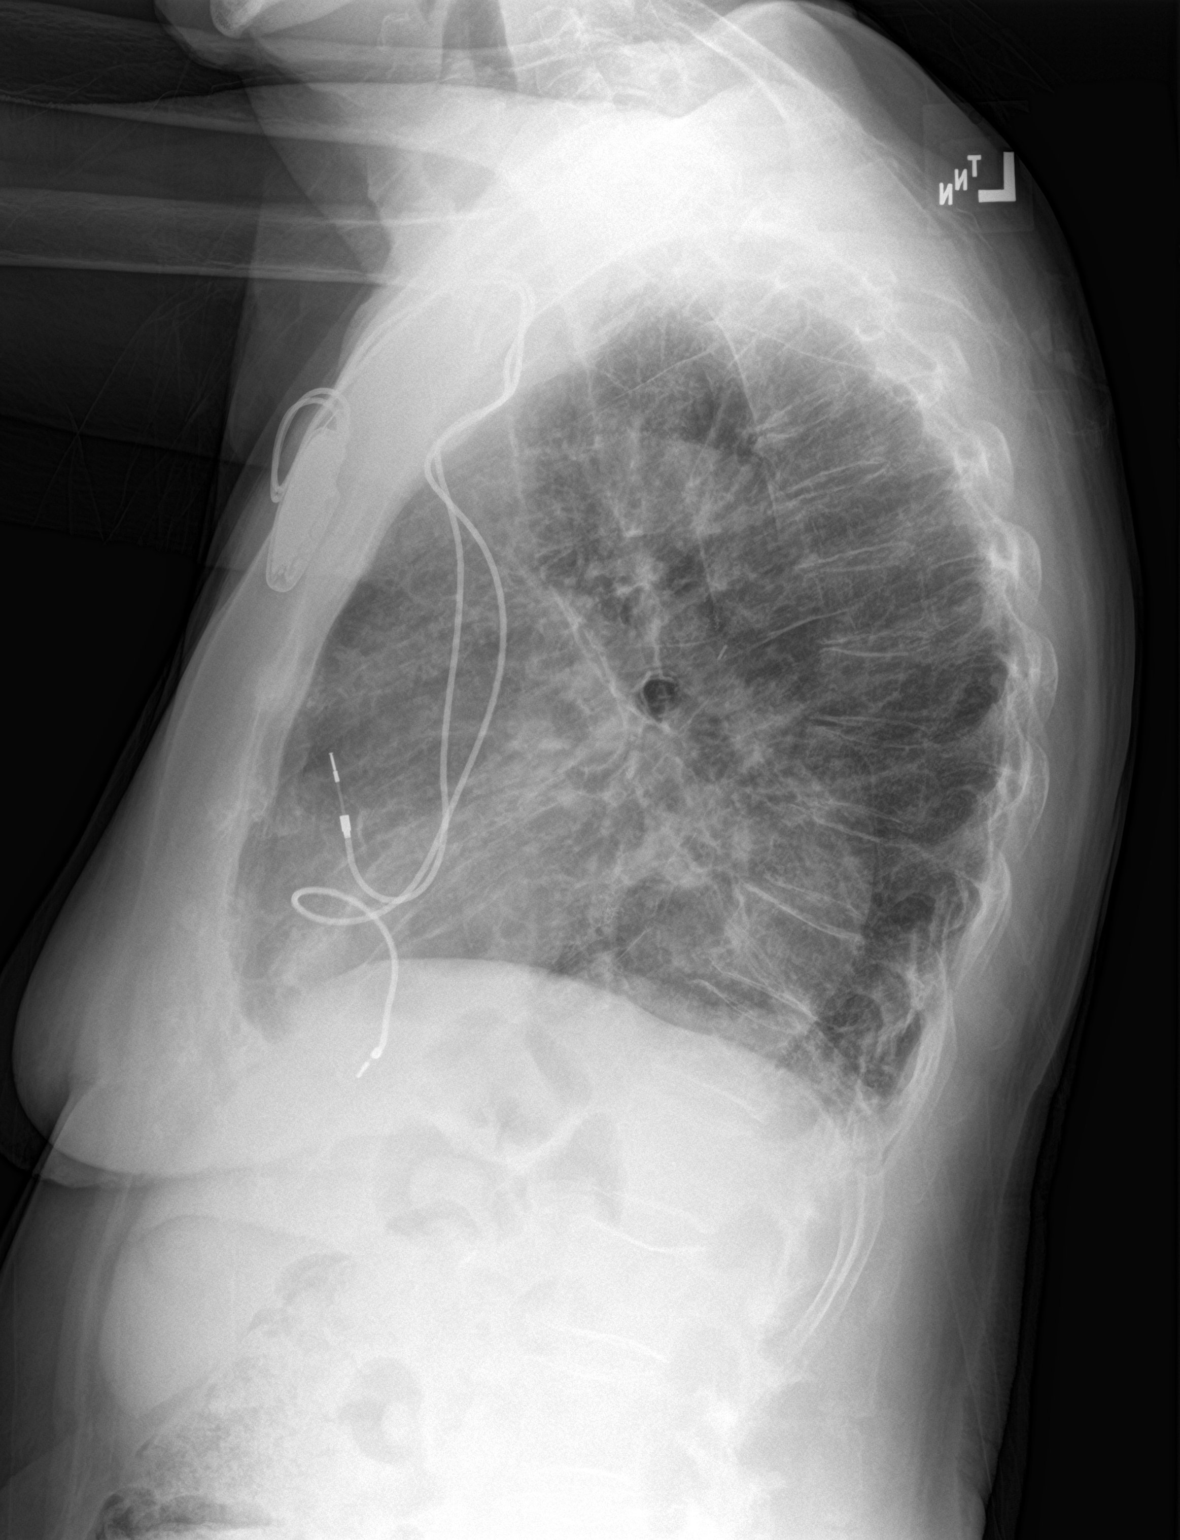

[2 of 2 positions shown; findings below may reference images not displayed]

FINDINGS: Chronic reticulation with patchy reticulonodular opacities that are
underestimated relative to chest CT 10/11/2016. No superimposed
acute opacity. No effusion or pneumothorax. Normal heart size and
stable mediastinal contours, including postoperative esophagus.
Dual-chamber pacer from the left.
IMPRESSION: Chronic lung disease without acute superimposed finding.

## 2019-08-26 IMAGING — DX DG CHEST 1V PORT
1 series · 1 of 1 positions shown · non-contrast
Comparison: Chest x-ray dated March 01, 2017.

CLINICAL DATA: Syncope.

EXAM:
PORTABLE CHEST 1 VIEW

[chest ap]
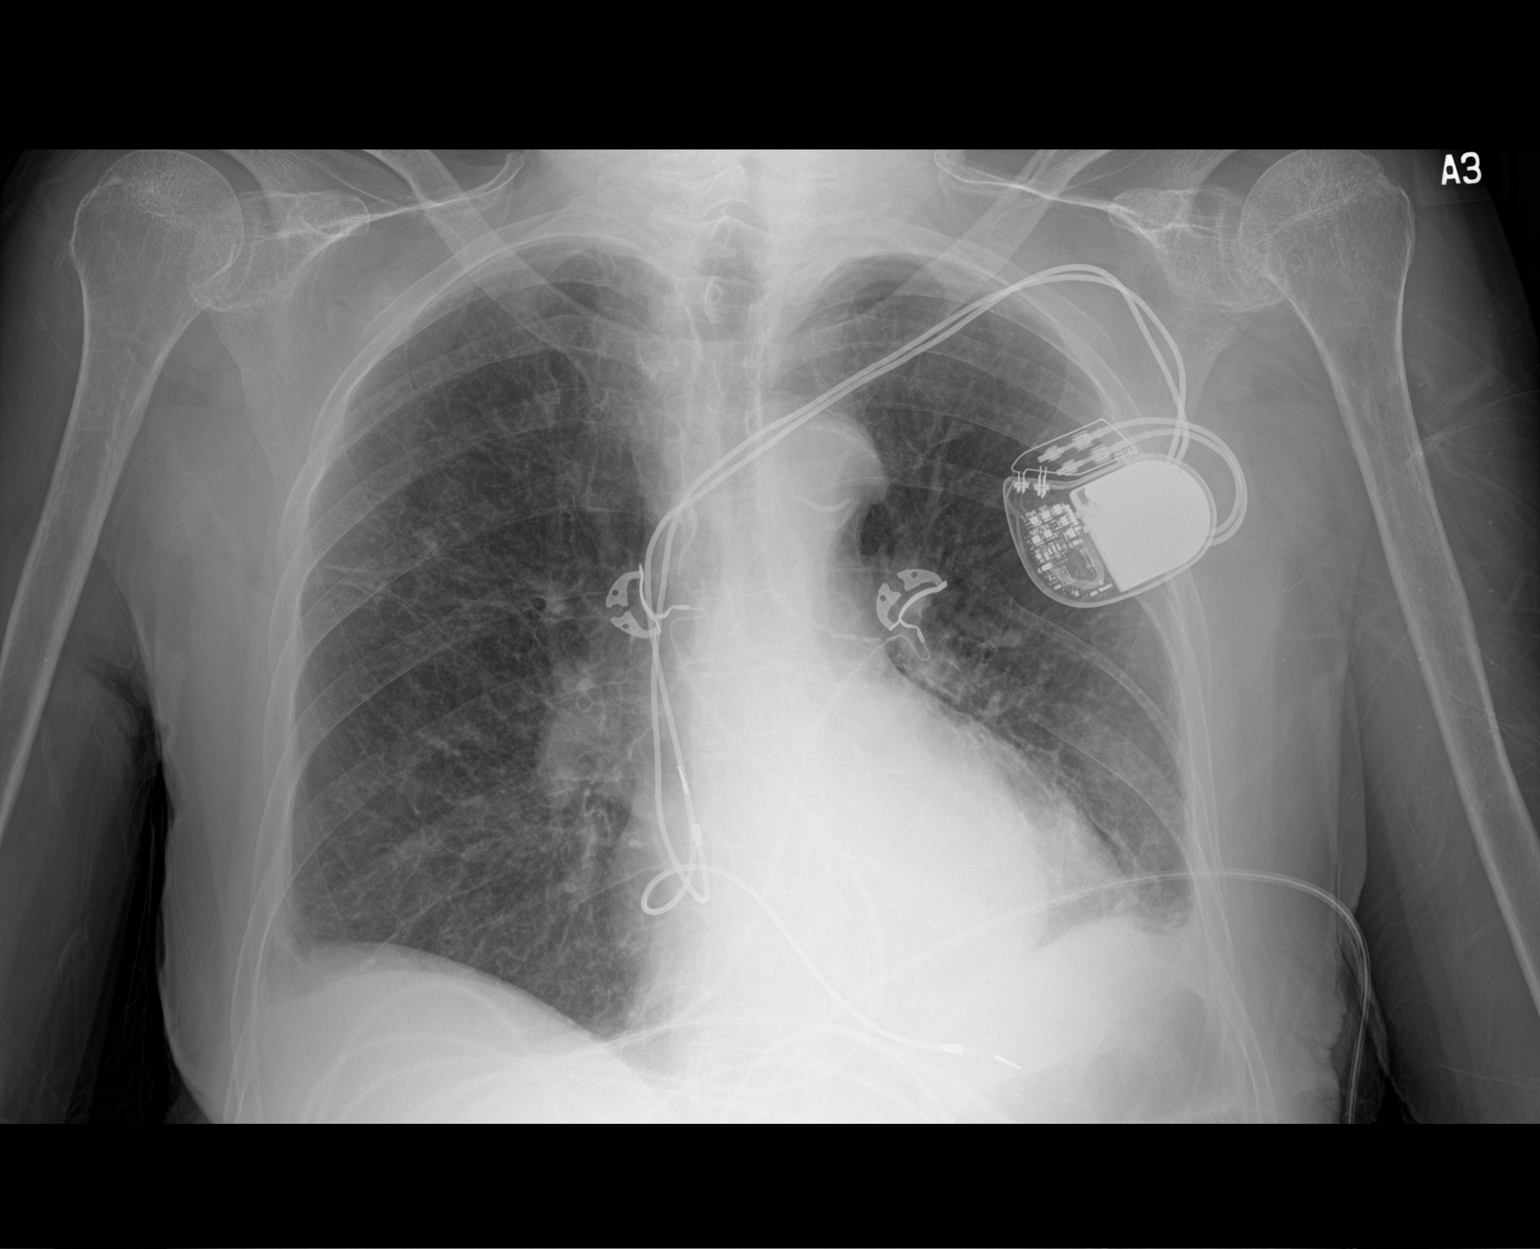

[1 of 1 positions shown; findings below may reference images not displayed]

FINDINGS: Left chest wall pacer device in unchanged position. The heart size
and mediastinal contours are within normal limits. Atherosclerotic
calcification of the aortic arch. Normal pulmonary vascularity.
Coarsened interstitial markings are again noted, with improved
reticulonodular opacities. Chronic blunting of the left costophrenic
angle is unchanged. No focal consolidation. No pneumothorax. No
large pleural effusion. Unchanged hiatal hernia. No acute osseous
abnormality.
IMPRESSION: 1. Improved reticulonodular opacities in both lungs. No acute
cardiopulmonary disease.
